# Patient Record
Sex: Female | Born: 1965
Health system: Southern US, Community
[De-identification: ages and names within clinical notes are randomized; demographics above are authoritative.]

## PROBLEM LIST (undated history)

## (undated) DIAGNOSIS — T7840XA Allergy, unspecified, initial encounter: Secondary | ICD-10-CM

## (undated) DIAGNOSIS — I509 Heart failure, unspecified: Secondary | ICD-10-CM

## (undated) DIAGNOSIS — R7303 Prediabetes: Secondary | ICD-10-CM

## (undated) DIAGNOSIS — R0602 Shortness of breath: Secondary | ICD-10-CM

## (undated) DIAGNOSIS — M1990 Unspecified osteoarthritis, unspecified site: Secondary | ICD-10-CM

## (undated) DIAGNOSIS — F329 Major depressive disorder, single episode, unspecified: Secondary | ICD-10-CM

## (undated) DIAGNOSIS — F1910 Other psychoactive substance abuse, uncomplicated: Secondary | ICD-10-CM

## (undated) DIAGNOSIS — J45909 Unspecified asthma, uncomplicated: Secondary | ICD-10-CM

## (undated) DIAGNOSIS — F419 Anxiety disorder, unspecified: Secondary | ICD-10-CM

## (undated) DIAGNOSIS — F32A Depression, unspecified: Secondary | ICD-10-CM

## (undated) DIAGNOSIS — I890 Lymphedema, not elsewhere classified: Secondary | ICD-10-CM

## (undated) DIAGNOSIS — I10 Essential (primary) hypertension: Secondary | ICD-10-CM

## (undated) DIAGNOSIS — R7989 Other specified abnormal findings of blood chemistry: Secondary | ICD-10-CM

## (undated) HISTORY — DX: Other psychoactive substance abuse, uncomplicated: F19.10

## (undated) HISTORY — DX: Allergy, unspecified, initial encounter: T78.40XA

## (undated) HISTORY — PX: BREAST BIOPSY: SHX20

## (undated) HISTORY — PX: TUBAL LIGATION: SHX77

## (undated) HISTORY — DX: Anxiety disorder, unspecified: F41.9

## (undated) HISTORY — DX: Unspecified osteoarthritis, unspecified site: M19.90

## (undated) HISTORY — PX: ABDOMINAL HYSTERECTOMY: SHX81

## (undated) HISTORY — DX: Other specified abnormal findings of blood chemistry: R79.89

## (undated) HISTORY — PX: HERNIA REPAIR: SHX51

## (undated) HISTORY — DX: Shortness of breath: R06.02

## (undated) HISTORY — DX: Heart failure, unspecified: I50.9

---
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
## 2017-06-01 DIAGNOSIS — R0789 Other chest pain: Secondary | ICD-10-CM | POA: Insufficient documentation

## 2019-12-24 DIAGNOSIS — J4541 Moderate persistent asthma with (acute) exacerbation: Secondary | ICD-10-CM | POA: Diagnosis not present

## 2019-12-24 DIAGNOSIS — Z23 Encounter for immunization: Secondary | ICD-10-CM | POA: Diagnosis not present

## 2019-12-24 DIAGNOSIS — Z1159 Encounter for screening for other viral diseases: Secondary | ICD-10-CM | POA: Diagnosis not present

## 2020-01-21 ENCOUNTER — Ambulatory Visit: Payer: Self-pay | Admitting: Allergy

## 2020-01-21 NOTE — Progress Notes (Deleted)
New Patient Note  RE: Janet [name] MRN: [id number] DOB: 03-22-1966 Date of Office Visit: 01/21/2020  Referring provider: No ref. provider found Primary care provider: No primary care provider on file.  Chief Complaint: No chief complaint on file.  History of Present Illness: I had the pleasure of seeing Janet [name] for initial evaluation at the [facility] [place] [facility] [place] on 01/21/2020. She is a 54 y.o. female, who is referred here by No primary care provider on file. for the evaluation of asthma.  She reports symptoms of *** chest tightness, shortness of breath, coughing, wheezing, nocturnal awakenings for *** years. Current medications include *** which help. She reports *** using aerochamber with inhalers. She tried the following inhalers: ***. Main triggers are ***allergies, infections, weather changes, smoke, exercise, pet exposure. In the last month, frequency of symptoms: ***x/week. Frequency of nocturnal symptoms: ***x/month. Frequency of SABA use: ***x/week. Interference with physical activity: ***. Sleep is ***disturbed. In the last 12 months, emergency room visits/urgent care visits/doctor office visits or hospitalizations due to respiratory issues: ***. In the last 12 months, oral steroids courses: ***. Lifetime history of hospitalization for respiratory issues: ***. Prior intubations: ***. Asthma was diagnosed at age *** by ***. History of pneumonia: ***. She was evaluated by allergist ***pulmonologist in the past. Smoking exposure: ***. Up to date with flu vaccine: ***. Up to date with pneumonia vaccine: ***.  History of reflux: ***.  Assessment and Plan: [name] is a 54 y.o. female with: No problem-specific Assessment & Plan notes found for this encounter.  No follow-ups on file.  No orders of the defined types were placed in this encounter.  Lab Orders  No laboratory test(s) ordered today    Other allergy screening: Asthma: {Blank  single:19197::"yes","no"} Rhino conjunctivitis: {Blank single:19197::"yes","no"} Food allergy: {Blank single:19197::"yes","no"} Medication allergy: {Blank single:19197::"yes","no"} Hymenoptera allergy: {Blank single:19197::"yes","no"} Urticaria: {Blank single:19197::"yes","no"} Eczema:{Blank single:19197::"yes","no"} History of recurrent infections suggestive of immunodeficency: {Blank single:19197::"yes","no"}  Diagnostics: Spirometry:  Tracings reviewed. Her effort: {Blank single:19197::"Good reproducible efforts.","It was hard to get consistent efforts and there is a question as to whether this reflects a maximal maneuver.","Poor effort, data can not be interpreted."} FVC: ***L FEV1: ***L, ***% predicted FEV1/FVC ratio: ***% Interpretation: {Blank single:19197::"Spirometry consistent with mild obstructive disease","Spirometry consistent with moderate obstructive disease","Spirometry consistent with severe obstructive disease","Spirometry consistent with possible restrictive disease","Spirometry consistent with mixed obstructive and restrictive disease","Spirometry uninterpretable due to technique","Spirometry consistent with normal pattern","No overt abnormalities noted given today's efforts"}.  Please see scanned spirometry results for details.  Skin Testing: {Blank single:19197::"Select foods","Environmental allergy panel","Environmental allergy panel and select foods","Food allergy panel","None","Deferred due to recent antihistamines use"}. Positive test to: ***. Negative test to: ***.  Results discussed with patient/family.   Past Medical History: There are no problems to display for this patient.  No past medical history on file. Past Surgical History: *** The histories are not reviewed yet. Please review them in the "History" navigator section and refresh this [place]. Medication List:  No current outpatient medications on file.   No current facility-administered  medications for this visit.   Allergies: Not on File Social History: Social History   Socioeconomic History  . Marital status: Not on file    Spouse name: Not on file  . Number of children: Not on file  . Years of education: Not on file  . Highest education level: Not on file  Occupational History  . Not on file  Tobacco Use  . Smoking status: Not on file  Substance and Sexual  Activity  . Alcohol use: Not on file  . Drug use: Not on file  . Sexual activity: Not on file  Other Topics Concern  . Not on file  Social History Narrative  . Not on file   Social Determinants of Health   Financial Resource Strain:   . Difficulty of Paying Living Expenses: Not on file  Food Insecurity:   . Worried About Charity fundraiser in the Last Year: Not on file  . Ran Out of Food in the Last Year: Not on file  Transportation Needs:   . Lack of Transportation (Medical): Not on file  . Lack of Transportation (Non-Medical): Not on file  Physical Activity:   . Days of Exercise per Week: Not on file  . Minutes of Exercise per Session: Not on file  Stress:   . Feeling of Stress : Not on file  Social Connections:   . Frequency of Communication with Friends and Family: Not on file  . Frequency of Social Gatherings with Friends and Family: Not on file  . Attends Religious Services: Not on file  . Active Member of Clubs or Organizations: Not on file  . Attends Archivist Meetings: Not on file  . Marital Status: Not on file   Lives in a ***. Smoking: *** Occupation: ***  Environmental HistoryFreight forwarder in the house: Estate agent in the family room: {Blank single:19197::"yes","no"} Carpet in the bedroom: {Blank single:19197::"yes","no"} Heating: {Blank single:19197::"electric","gas"} Cooling: {Blank single:19197::"central","window"} Pet: {Blank single:19197::"yes ***","no"}  Family History: No family history on file. Problem                                Relation Asthma                                   *** Eczema                                *** Food allergy                          *** Allergic rhino conjunctivitis     ***  Review of Systems  Constitutional: Negative for appetite change, chills, fever and unexpected weight change.  HENT: Negative for congestion and rhinorrhea.   Eyes: Negative for itching.  Respiratory: Negative for cough, chest tightness, shortness of breath and wheezing.   Cardiovascular: Negative for chest pain.  Gastrointestinal: Negative for abdominal pain.  Genitourinary: Negative for difficulty urinating.  Skin: Negative for rash.  Allergic/Immunologic: Negative for environmental allergies and food allergies.  Neurological: Negative for headaches.   Objective: There were no vitals taken for this visit. There is no height or weight on file to calculate BMI. Physical Exam  Constitutional: She is oriented to person, place, and time. She appears well-developed and well-nourished.  HENT:  Head: Normocephalic and atraumatic.  Right Ear: External ear normal.  Left Ear: External ear normal.  Nose: Nose normal.  Mouth/Throat: Oropharynx is clear and moist.  Eyes: Conjunctivae and EOM are normal.  Cardiovascular: Normal rate, regular rhythm and normal heart sounds. Exam reveals no gallop and no friction rub.  No murmur heard. Pulmonary/Chest: Effort normal and breath sounds normal. She has no wheezes. She has no rales.  Abdominal:  Soft.  Musculoskeletal:     Cervical back: Neck supple.  Neurological: She is alert and oriented to person, place, and time.  Skin: Skin is warm. No rash noted.  Psychiatric: She has a normal mood and affect. Her behavior is normal.  Nursing note and vitals reviewed.  The plan was reviewed with the patient/family, and all questions/concerned were addressed.  It was my pleasure to see Bonny today and participate in her care. Please feel free to contact me with  any questions or concerns.  Sincerely,  Rexene Alberts, DO Allergy & Immunology  [facility] office: [phone] [facility] office: [place] office: [phone]

## 2020-01-24 ENCOUNTER — Other Ambulatory Visit: Payer: Self-pay

## 2020-01-24 ENCOUNTER — Ambulatory Visit (INDEPENDENT_AMBULATORY_CARE_PROVIDER_SITE_OTHER): Payer: BC Managed Care – PPO

## 2020-01-24 ENCOUNTER — Encounter (HOSPITAL_COMMUNITY): Payer: Self-pay | Admitting: *Deleted

## 2020-01-24 ENCOUNTER — Ambulatory Visit (HOSPITAL_COMMUNITY)
Admission: EM | Admit: 2020-01-24 | Discharge: 2020-01-24 | Disposition: A | Discharge status: Home / Self Care | Payer: BC Managed Care – PPO

## 2020-01-24 DIAGNOSIS — J4541 Moderate persistent asthma with (acute) exacerbation: Secondary | ICD-10-CM | POA: Diagnosis not present

## 2020-01-24 DIAGNOSIS — I10 Essential (primary) hypertension: Secondary | ICD-10-CM

## 2020-01-24 DIAGNOSIS — R05 Cough: Secondary | ICD-10-CM | POA: Diagnosis not present

## 2020-01-24 DIAGNOSIS — R0602 Shortness of breath: Secondary | ICD-10-CM | POA: Diagnosis not present

## 2020-01-24 HISTORY — DX: Depression, unspecified: F32.A

## 2020-01-24 HISTORY — DX: Lymphedema, not elsewhere classified: I89.0

## 2020-01-24 HISTORY — DX: Unspecified asthma, uncomplicated: J45.909

## 2020-01-24 HISTORY — DX: Essential (primary) hypertension: I10

## 2020-01-24 MED ORDER — PREDNISONE 20 MG PO TABS: ORAL_TABLET | ORAL | 0 refills | Status: DC

## 2020-01-24 MED ORDER — PROMETHAZINE-DM 6.25-15 MG/5ML PO SYRP: 5.0000 mL | ORAL_SOLUTION | Freq: Four times a day (QID) | ORAL | 0 refills | Status: DC | PRN

## 2020-01-24 MED ORDER — ALBUTEROL SULFATE HFA 108 (90 BASE) MCG/ACT IN AERS: 2.0000 | INHALATION_SPRAY | RESPIRATORY_TRACT | Status: DC | PRN

## 2020-01-24 MED ORDER — LOSARTAN POTASSIUM-HCTZ 100-12.5 MG PO TABS: 1.0000 | ORAL_TABLET | Freq: Every day | ORAL | 1 refills | Status: DC

## 2020-01-24 MED ORDER — DOXYCYCLINE HYCLATE 100 MG PO CAPS: 100.0000 mg | ORAL_CAPSULE | Freq: Two times a day (BID) | ORAL | 0 refills | Status: DC

## 2020-01-24 NOTE — ED Triage Notes (Signed)
Pt reports recently moving to [facility] - does not have PCP yet.  C/O asthma flare-up - cough (states her normal asthma cough), SOB, wheezing.  Has been using albuterol HFA without significant relief.  STates has been unable to use nebulizer due to missing mouth piece.  Denies any fevers, or any other sxs.  Pt declines Covid test.

## 2020-01-24 NOTE — ED Triage Notes (Signed)
Pt states she will run out of some of her meds shortly.

## 2020-01-24 NOTE — Discharge Instructions (Addendum)
I am placing you on a prednisone taper, Take Prednisone 20 mg,  in mornings with breakfast as follows:  Take 3 pills for 3 days, Take 2 pills for 3 days, and Take 1 pill for 3 days. Complete all medication.  Your chest x-ray is normal.  I have printed off a prescription for doxycycline which is an antibiotic I would like for you to start in 3 days if your symptoms have not significantly improved or if cough becomes productive as this could be an negative of bronchitis.  I have refilled her blood pressure medication.  I have also sent over Promethazine DM 5 mL every 6 hours as needed for cough.

## 2020-01-24 NOTE — ED Notes (Signed)
Nebulizer mouth piece set-up provided to pt for home use.

## 2020-01-24 NOTE — ED Provider Notes (Addendum)
[facility]    CSN: [id number] Arrival date & time: 01/24/20  1105      History   Chief Complaint Chief Complaint  Patient presents with  . Asthma    HPI Janet [name] is a 54 y.o. female.   HPI  Patient presents today concern for an asthma exacerbation.  Patient suffers from chronic asthma and is currently prescribed Symbicort, albuterol neb and inhaler.  She reports not using her nebulizer recently due to tubing issues.  She feels her albuterol is not helping.  A provider in the past had her on daily prednisone however she is recently relocated and has not been prescribed chronic steroids.  Last prescription for prednisone was the first week of February.  She reported initial improvement of shortness of breath and wheezing however she is struggling with shortness of breath, wheezing, and coughing persistently worsening over the last few weeks.  She has not had any known contact with anyone who is COVID-19 positive.  She declines testing today.  She is requesting a refill of her blood pressure medication.  Blood pressure is elevated on today.  She does not have a PCP due to recently located to [place].  Past Medical History:  Diagnosis Date  . Asthma   . Depression   . Hypertension   . Lymphedema     There are no problems to display for this patient.   Past Surgical History:  Procedure Laterality Date  . ABDOMINAL HYSTERECTOMY    . CESAREAN SECTION    . HERNIA REPAIR    . TUBAL LIGATION      OB History   No obstetric history on file.      Home Medications    Prior to Admission medications   Medication Sig Start Date End Date Taking? Authorizing Provider  albuterol (PROVENTIL) (5 MG/ML) 0.5% nebulizer solution Take 2.5 mg by nebulization every 6 (six) hours as needed for wheezing or shortness of breath.   Yes [provider]  ALBUTEROL SULFATE HFA IN Inhale into the lungs.   Yes [provider]  ARIPiprazole (ABILIFY) 10 MG tablet  Take 10 mg by mouth daily.   Yes [provider]  budesonide-formoterol (SYMBICORT) 160-4.5 MCG/ACT inhaler Inhale 2 puffs into the lungs 2 (two) times daily.   Yes [provider]  losartan-hydrochlorothiazide (HYZAAR) 100-12.5 MG tablet Take 1 tablet by mouth daily.   Yes [provider]  montelukast (SINGULAIR) 10 MG tablet Take 10 mg by mouth at bedtime.   Yes [provider]  sertraline (ZOLOFT) 100 MG tablet Take 100 mg by mouth daily.   Yes [provider]  traZODone (DESYREL) 100 MG tablet Take 100 mg by mouth at bedtime.   Yes [provider]    Family History Family History  Problem Relation Age of Onset  . Hypertension Mother   . Stroke Mother   . Deep vein thrombosis Mother     Social History Social History   Tobacco Use  . Smoking status: Never Smoker  . Smokeless tobacco: Never Used  Substance Use Topics  . Alcohol use: Yes    Comment: occasional  . Drug use: Yes    Types: Marijuana    Comment: once/month     Allergies   Biaxin [clarithromycin] and Penicillins   Review of Systems Review of Systems Pertinent negatives listed in HPI  Physical Exam Triage Vital Signs ED Triage Vitals  Enc Vitals Group     BP 01/24/20 1234 (!) 149/93  Pulse Rate 01/24/20 1234 74     Resp 01/24/20 1234 (!) 24     Temp 01/24/20 1234 98.6 F (37 C)     Temp Source 01/24/20 1234 Oral     SpO2 01/24/20 1234 100 %     Weight --      Height --      Head Circumference --      Peak Flow --      Pain Score 01/24/20 1235 0     Pain Loc --      Pain Edu? --      Excl. in [place]? --    No data found.  Updated Vital Signs BP (!) 149/93 Comment: has not taken HTN med today  Pulse 74   Temp 98.6 F (37 C) (Oral)   Resp (!) 24   SpO2 100%   Visual Acuity Right Eye Distance:   Left Eye Distance:   Bilateral Distance:    Right Eye Near:   Left Eye Near:    Bilateral Near:     Physical Exam Constitutional:       Appearance: Normal appearance.  HENT:     Head: Normocephalic.  Cardiovascular:     Rate and Rhythm: Normal rate and regular rhythm.  Pulmonary:     Breath sounds: Wheezing and rhonchi present.  Chest:     Chest wall: No tenderness.  Abdominal:     General: Bowel sounds are normal.     Palpations: Abdomen is soft.  Musculoskeletal:        General: Normal range of motion.     Cervical back: Normal range of motion.  Neurological:     General: No focal deficit present.     Mental Status: She is alert.  Psychiatric:        Mood and Affect: Mood normal.      UC Treatments / Results  Labs (all labs ordered are listed, but only abnormal results are displayed) Labs Reviewed - No data to display  EKG   Radiology DG Chest 2 View  Result Date: 01/24/2020 CLINICAL DATA:  Shortness of breath and cough. EXAM: CHEST - 2 VIEW COMPARISON:  None. FINDINGS: Both lungs are clear. Negative for a pneumothorax. Heart and mediastinum are within normal limits. Trachea is midline. Degenerative changes in the thoracic spine. No pleural effusions. IMPRESSION: No active cardiopulmonary disease. Electronically Signed   By: Markus Daft M.D.   On: 01/24/2020 13:26    Procedures Procedures (including critical care time)  Medications Ordered in UC Medications - No data to display  Initial Impression / Assessment and Plan / UC Course  I have reviewed the triage vital signs and the nursing notes.  Pertinent labs & imaging results that were available during my care of the patient were reviewed by me and considered in my medical decision making (see chart for details).    Treating for acute asthma exacerbation. Trial a prednisone taper. Patient given a prescription for Doxycyline to start in 3 days if symptoms do not improve with prednisone initiation. Promethazine-DM prescribed for anti-tussive therapy. Chest x-ray reassuring. Encouraged consistent use of inhaled therapies and new nebulizer tubing  provided. Refilled antihypertensive medication. Encouraged to establish care at [facility] at [facility] to receive on-going management of chronic conditions. Final Clinical Impressions(s) / UC Diagnoses   Final diagnoses:  Moderate persistent asthma with exacerbation  Essential hypertension     Discharge Instructions     I am placing you on a prednisone taper, Take  Prednisone 20 mg,  in mornings with breakfast as follows:  Take 3 pills for 3 days, Take 2 pills for 3 days, and Take 1 pill for 3 days. Complete all medication.  Your chest x-ray is normal.  I have printed off a prescription for doxycycline which is an antibiotic I would like for you to start in 3 days if your symptoms have not significantly improved or if cough becomes productive as this could be an negative of bronchitis.  I have refilled her blood pressure medication.  I have also sent over Promethazine DM 5 mL every 6 hours as needed for cough.      ED Prescriptions    Medication Sig Dispense Auth. Provider   losartan-hydrochlorothiazide (HYZAAR) 100-12.5 MG tablet Take 1 tablet by mouth daily. 30 tablet Scot Jun, FNP   predniSONE (DELTASONE) 20 MG tablet Take 3 PO QAM x3days, 2 PO QAM x3days, 1 PO QAM x3days 18 tablet Scot Jun, FNP   doxycycline (VIBRAMYCIN) 100 MG capsule Take 1 capsule (100 mg total) by mouth 2 (two) times daily. 20 capsule Scot Jun, FNP   promethazine-dextromethorphan (PROMETHAZINE-DM) 6.25-15 MG/5ML syrup Take 5 mLs by mouth 4 (four) times daily as needed for cough. 140 mL Scot Jun, FNP     PDMP not reviewed this encounter.   Scot Jun, FNP 01/25/20 2105    Scot Jun, FNP 01/25/20 2107

## 2020-03-24 ENCOUNTER — Other Ambulatory Visit: Payer: Self-pay | Admitting: Family Medicine

## 2020-03-29 DIAGNOSIS — I890 Lymphedema, not elsewhere classified: Secondary | ICD-10-CM | POA: Diagnosis not present

## 2020-03-29 DIAGNOSIS — R0602 Shortness of breath: Secondary | ICD-10-CM | POA: Diagnosis not present

## 2020-04-05 ENCOUNTER — Other Ambulatory Visit: Payer: Self-pay

## 2020-04-05 NOTE — Telephone Encounter (Signed)
NOTES ON FILE FROM [employer] [phone], SENT REFERRAL TO SCHEDULING

## 2020-04-06 ENCOUNTER — Telehealth: Payer: Self-pay

## 2020-04-06 NOTE — Telephone Encounter (Signed)
NOTES ON FILE FROM [employer] [phone]. REFERRAL SENT TO SCHEDULING

## 2020-04-22 ENCOUNTER — Ambulatory Visit: Payer: BC Managed Care – PPO | Admitting: Cardiology

## 2020-06-01 DIAGNOSIS — F411 Generalized anxiety disorder: Secondary | ICD-10-CM | POA: Diagnosis not present

## 2020-06-01 DIAGNOSIS — F4310 Post-traumatic stress disorder, unspecified: Secondary | ICD-10-CM | POA: Diagnosis not present

## 2020-06-01 DIAGNOSIS — F3132 Bipolar disorder, current episode depressed, moderate: Secondary | ICD-10-CM | POA: Diagnosis not present

## 2020-06-11 ENCOUNTER — Ambulatory Visit: Payer: BC Managed Care – PPO | Admitting: Interventional Cardiology

## 2020-06-14 DIAGNOSIS — F3132 Bipolar disorder, current episode depressed, moderate: Secondary | ICD-10-CM | POA: Diagnosis not present

## 2020-06-18 DIAGNOSIS — I10 Essential (primary) hypertension: Secondary | ICD-10-CM | POA: Diagnosis not present

## 2020-06-18 DIAGNOSIS — Z13220 Encounter for screening for lipoid disorders: Secondary | ICD-10-CM | POA: Diagnosis not present

## 2020-06-18 DIAGNOSIS — R0601 Orthopnea: Secondary | ICD-10-CM | POA: Diagnosis not present

## 2020-06-18 DIAGNOSIS — J4541 Moderate persistent asthma with (acute) exacerbation: Secondary | ICD-10-CM | POA: Diagnosis not present

## 2020-07-15 DIAGNOSIS — F411 Generalized anxiety disorder: Secondary | ICD-10-CM | POA: Diagnosis not present

## 2020-07-15 DIAGNOSIS — F3132 Bipolar disorder, current episode depressed, moderate: Secondary | ICD-10-CM | POA: Diagnosis not present

## 2020-07-15 DIAGNOSIS — F4310 Post-traumatic stress disorder, unspecified: Secondary | ICD-10-CM | POA: Diagnosis not present

## 2020-07-28 ENCOUNTER — Ambulatory Visit (INDEPENDENT_AMBULATORY_CARE_PROVIDER_SITE_OTHER): Payer: BC Managed Care – PPO | Admitting: Cardiovascular Disease

## 2020-07-28 ENCOUNTER — Other Ambulatory Visit: Payer: Self-pay

## 2020-07-28 ENCOUNTER — Encounter: Payer: Self-pay | Admitting: Cardiovascular Disease

## 2020-07-28 VITALS — BP 114/82 | HR 78 | Ht 68.0 in | Wt 291.4 lb

## 2020-07-28 DIAGNOSIS — R0600 Dyspnea, unspecified: Secondary | ICD-10-CM | POA: Diagnosis not present

## 2020-07-28 DIAGNOSIS — R0609 Other forms of dyspnea: Secondary | ICD-10-CM

## 2020-07-28 NOTE — Patient Instructions (Addendum)
Medication Instructions:  Your physician recommends that you continue on your current medications as directed. Please refer to the Current Medication list given to you today.  *If you need a refill on your cardiac medications before your next appointment, please call your pharmacy*   Lab Work: None  If you have labs (blood work) drawn today and your tests are completely normal, you will receive your results only by: Marland Kitchen MyChart Message (if you have MyChart) OR . A paper copy in the mail If you have any lab test that is abnormal or we need to change your treatment, we will call you to review the results.   Testing/Procedures: Your physician has requested that you have an echocardiogram. Echocardiography is a painless test that uses sound waves to create images of your heart. It provides your doctor with information about the size and shape of your heart and how well your heart's chambers and valves are working. This procedure takes approximately one hour. There are no restrictions for this procedure.  Follow-Up: AS NEEDED  Other Instructions You have been referred to Medical Weight Management Program   Mediterranean Diet A Mediterranean diet refers to food and lifestyle choices that are based on the traditions of countries located on the [employer]. This way of eating has been shown to help prevent certain conditions and improve outcomes for people who have chronic diseases, like kidney disease and heart disease. What are tips for following this plan? Lifestyle  Cook and eat meals together with your family, when possible.  Drink enough fluid to keep your urine clear or pale yellow.  Be physically active every day. This includes: ? Aerobic exercise like running or swimming. ? Leisure activities like gardening, walking, or housework.  Get 7-8 hours of sleep each night.  If recommended by your health care provider, drink red wine in moderation. This means 1 glass a day for  nonpregnant women and 2 glasses a day for men. A glass of wine equals 5 oz (150 mL). Reading food labels   Check the serving size of packaged foods. For foods such as rice and pasta, the serving size refers to the amount of cooked product, not dry.  Check the total fat in packaged foods. Avoid foods that have saturated fat or trans fats.  Check the ingredients list for added sugars, such as corn syrup. Shopping  At the grocery store, buy most of your food from the areas near the walls of the store. This includes: ? Fresh fruits and vegetables (produce). ? Grains, beans, nuts, and seeds. Some of these may be available in unpackaged forms or large amounts (in bulk). ? Fresh seafood. ? Poultry and eggs. ? Low-fat dairy products.  Buy whole ingredients instead of prepackaged foods.  Buy fresh fruits and vegetables in-season from local farmers markets.  Buy frozen fruits and vegetables in resealable bags.  If you do not have access to quality fresh seafood, buy precooked frozen shrimp or canned fish, such as tuna, salmon, or sardines.  Buy small amounts of raw or cooked vegetables, salads, or olives from the deli or salad bar at your store.  Stock your pantry so you always have certain foods on hand, such as olive oil, canned tuna, canned tomatoes, rice, pasta, and beans. Cooking  Cook foods with extra-virgin olive oil instead of using butter or other vegetable oils.  Have meat as a side dish, and have vegetables or grains as your main dish. This means having meat in small portions or  adding small amounts of meat to foods like pasta or stew.  Use beans or vegetables instead of meat in common dishes like chili or lasagna.  Experiment with different cooking methods. Try roasting or broiling vegetables instead of steaming or sauteing them.  Add frozen vegetables to soups, stews, pasta, or rice.  Add nuts or seeds for added healthy fat at each meal. You can add these to yogurt,  salads, or vegetable dishes.  Marinate fish or vegetables using olive oil, lemon juice, garlic, and fresh herbs. Meal planning   Plan to eat 1 vegetarian meal one day each week. Try to work up to 2 vegetarian meals, if possible.  Eat seafood 2 or more times a week.  Have healthy snacks readily available, such as: ? Vegetable sticks with hummus. ? [place] yogurt. ? Fruit and nut trail mix.  Eat balanced meals throughout the week. This includes: ? Fruit: 2-3 servings a day ? Vegetables: 4-5 servings a day ? Low-fat dairy: 2 servings a day ? Fish, poultry, or lean meat: 1 serving a day ? Beans and legumes: 2 or more servings a week ? Nuts and seeds: 1-2 servings a day ? Whole grains: 6-8 servings a day ? Extra-virgin olive oil: 3-4 servings a day  Limit red meat and sweets to only a few servings a month What are my food choices?  Mediterranean diet ? Recommended  Grains: Whole-grain pasta. Brown rice. Bulgar wheat. Polenta. Couscous. Whole-wheat bread. [place].  Vegetables: Artichokes. Beets. Broccoli. Cabbage. Carrots. Eggplant. Green beans. Chard. Kale. Spinach. Onions. Leeks. Peas. Squash. Tomatoes. Peppers. Radishes.  Fruits: Apples. Apricots. Avocado. Berries. Bananas. Cherries. Dates. Figs. Grapes. Lemons. Melon. Oranges. Peaches. Plums. Pomegranate.  Meats and other protein foods: Beans. Almonds. Sunflower seeds. Pine nuts. Peanuts. [place]. Salmon. Scallops. Shrimp. [place]. Tilapia. Clams. Oysters. Eggs.  Dairy: Low-fat milk. Cheese. Greek yogurt.  Beverages: Water. Red wine. Herbal tea.  Fats and oils: Extra virgin olive oil. Avocado oil. Grape seed oil.  Sweets and desserts: [place] yogurt with honey. Baked apples. Poached pears. Trail mix.  Seasoning and other foods: Basil. Cilantro. Coriander. Cumin. Mint. Parsley. Sage. Rosemary. Tarragon. Garlic. Oregano. Thyme. Pepper. Balsalmic vinegar. Tahini. Hummus. Tomato sauce. Olives. Mushrooms. ? Limit  these  Grains: Prepackaged pasta or rice dishes. Prepackaged cereal with added sugar.  Vegetables: Deep fried potatoes (french fries).  Fruits: Fruit canned in syrup.  Meats and other protein foods: Beef. Pork. Lamb. Poultry with skin. Hot dogs. Berniece Salines.  Dairy: Ice cream. Sour cream. Whole milk.  Beverages: Juice. Sugar-sweetened soft drinks. Beer. Liquor and spirits.  Fats and oils: Butter. Canola oil. Vegetable oil. Beef fat (tallow). Lard.  Sweets and desserts: Cookies. Cakes. Pies. Candy.  Seasoning and other foods: Mayonnaise. Premade sauces and marinades. The items listed may not be a complete list. Talk with your dietitian about what dietary choices are right for you. Summary  The Mediterranean diet includes both food and lifestyle choices.  Eat a variety of fresh fruits and vegetables, beans, nuts, seeds, and whole grains.  Limit the amount of red meat and sweets that you eat.  Talk with your health care provider about whether it is safe for you to drink red wine in moderation. This means 1 glass a day for nonpregnant women and 2 glasses a day for men. A glass of wine equals 5 oz (150 mL). This information is not intended to replace advice given to you by your health care provider. Make sure you discuss any questions you have  with your health care provider. Document Revised: 07/06/2016 Document Reviewed: 06/29/2016 Elsevier Patient Education  Green.

## 2020-07-28 NOTE — Progress Notes (Signed)
Cardiology Office Note:    Date:  07/28/2020   ID:  Janet [name], DOB February 12, 1966, MRN [id number]  PCP:  Patient, No Pcp Per  [facility] Cardiologist:  Antonya Leeder  [facility] Electrophysiologist:  None   Referring MD: Nadyne Coombes, NP   Chief Complaint  Patient presents with  . Shortness of Breath     Sept. 8, 2021   Janet [name] is a 54 y.o. female with a hx of *obesity, asthma, HTN who we are asked to see by Carla Drape , NP for further evaluation of her shortness of breath   Is short of breath, primarily at night For the past 6 months  Has gained 30 lbs during that time  Long standing lymphedema  Walks on occasion Is going to school  ( [employer]) social work major Walk on campus  Has DOE walking around campus  Has had both covid vaccines.   No CP .  Has just recently improved her diet  Decided to improve her diet when she was referred to a cardiologist     Past Medical History:  Diagnosis Date  . Asthma   . Depression   . Elevated brain natriuretic peptide (BNP) level   . Hypertension   . Lymphedema   . SOB (shortness of breath)     Past Surgical History:  Procedure Laterality Date  . ABDOMINAL HYSTERECTOMY    . CESAREAN SECTION    . HERNIA REPAIR    . TUBAL LIGATION      Current Medications: Current Meds  Medication Sig  . albuterol (PROVENTIL) (5 MG/ML) 0.5% nebulizer solution Take 2.5 mg by nebulization every 6 (six) hours as needed for wheezing or shortness of breath.  . ARIPiprazole (ABILIFY) 10 MG tablet Take 10 mg by mouth daily.  . budesonide-formoterol (SYMBICORT) 160-4.5 MCG/ACT inhaler Inhale 2 puffs into the lungs 2 (two) times daily.  Marland Kitchen losartan-hydrochlorothiazide (HYZAAR) 100-12.5 MG tablet Take 1 tablet by mouth daily.  . montelukast (SINGULAIR) 10 MG tablet Take 10 mg by mouth at bedtime.  . promethazine-dextromethorphan (PROMETHAZINE-DM) 6.25-15 MG/5ML syrup Take 5 mLs by mouth 4 (four) times daily as needed for cough.  .  traZODone (DESYREL) 100 MG tablet Take 100 mg by mouth at bedtime.     Allergies:   Biaxin [clarithromycin] and Penicillins   Social History   Socioeconomic History  . Marital status: Single    Spouse name: Not on file  . Number of children: Not on file  . Years of education: Not on file  . Highest education level: Not on file  Occupational History  . Not on file  Tobacco Use  . Smoking status: Never Smoker  . Smokeless tobacco: Never Used  Vaping Use  . Vaping Use: Never used  Substance and Sexual Activity  . Alcohol use: Yes    Comment: occasional  . Drug use: Yes    Types: Marijuana    Comment: once/month  . Sexual activity: Yes    Birth control/protection: Surgical  Other Topics Concern  . Not on file  Social History Narrative  . Not on file   Social Determinants of Health   Financial Resource Strain:   . Difficulty of Paying Living Expenses: Not on file  Food Insecurity:   . Worried About Charity fundraiser in the Last Year: Not on file  . Ran Out of Food in the Last Year: Not on file  Transportation Needs:   . Lack of Transportation (Medical): Not on file  .  Lack of Transportation (Non-Medical): Not on file  Physical Activity:   . Days of Exercise per Week: Not on file  . Minutes of Exercise per Session: Not on file  Stress:   . Feeling of Stress : Not on file  Social Connections:   . Frequency of Communication with Friends and Family: Not on file  . Frequency of Social Gatherings with Friends and Family: Not on file  . Attends Religious Services: Not on file  . Active Member of Clubs or Organizations: Not on file  . Attends Archivist Meetings: Not on file  . Marital Status: Not on file     Family History: The patient's family history includes Congestive Heart Failure in her maternal grandmother and mother; Deep vein thrombosis in her mother; Hypertension in her mother; Obesity in her brother and sister; Stroke in her mother.  ROS:     Please see the history of present illness.     All other systems reviewed and are negative.  EKGs/Labs/Other Studies Reviewed:    The following studies were reviewed today:   EKG: July 28, 2020: Normal sinus rhythm at 78.  Nonspecific ST and T wave abnormalities.  Recent Labs: No results found for requested labs within last 8760 hours.  Recent Lipid Panel No results found for: CHOL, TRIG, HDL, CHOLHDL, VLDL, LDLCALC, LDLDIRECT  Physical Exam:    VS:  BP 114/82   Pulse 78   Ht 5\' 8"  (1.727 m)   Wt 291 lb 6.4 oz (132.2 kg)   SpO2 97%   BMI 44.31 kg/m     Wt Readings from Last 3 Encounters:  07/28/20 291 lb 6.4 oz (132.2 kg)     GEN: morbidly obese female,  Very pleasant  HEENT: Normal NECK: No JVD; No carotid bruits LYMPHATICS: No lymphadenopathy CARDIAC: RRR, no murmurs, rubs, gallops RESPIRATORY:  Clear to auscultation without rales, wheezing or rhonchi  ABDOMEN: Soft, non-tender, non-distended MUSCULOSKELETAL:  No edema; No deformity  SKIN: Warm and dry NEUROLOGIC:  Alert and oriented x 3 PSYCHIATRIC:  Normal affect   ASSESSMENT:    1. DOE (dyspnea on exertion)    PLAN:    In order of problems listed above:  1. DOE: Aviella has had shortness of breath for the past 6 months or so.  She admits that she has been eating poorly and has gained lots of weight over the past several months.  She is back in school at [employer] and is getting her social work degree.  She is having trouble with PND and orthopnea.  She also so has significant DOE.  I strongly suspect that this is weight gain issue for her.  I would like to get an echocardiogram to evaluate her her cardiac status.  I strongly advised her to work on weight loss program.  We will refer her to the [facility] weight loss program.  Also given her some recommendations on the Mediterranean diet.  She says that she is committed to losing weight and has an excellent attitude.  I will see her on an as-needed basis or  if the echocardiogram shows some significant abnormality.  I told her to call us anytime for an office visit if needed.   Medication Adjustments/Labs and Tests Ordered: Current medicines are reviewed at length with the patient today.  Concerns regarding medicines are outlined above.  Orders Placed This Encounter  Procedures  . Amb Ref to Medical Weight Management  . EKG 12-Lead  . ECHOCARDIOGRAM COMPLETE  No orders of the defined types were placed in this encounter.    Patient Instructions  Medication Instructions:  Your physician recommends that you continue on your current medications as directed. Please refer to the Current Medication list given to you today.  *If you need a refill on your cardiac medications before your next appointment, please call your pharmacy*   Lab Work: None  If you have labs (blood work) drawn today and your tests are completely normal, you will receive your results only by: Marland Kitchen MyChart Message (if you have MyChart) OR . A paper copy in the mail If you have any lab test that is abnormal or we need to change your treatment, we will call you to review the results.   Testing/Procedures: Your physician has requested that you have an echocardiogram. Echocardiography is a painless test that uses sound waves to create images of your heart. It provides your doctor with information about the size and shape of your heart and how well your heart's chambers and valves are working. This procedure takes approximately one hour. There are no restrictions for this procedure.  Follow-Up: AS NEEDED  Other Instructions You have been referred to Medical Weight Management Program   Mediterranean Diet A Mediterranean diet refers to food and lifestyle choices that are based on the traditions of countries located on the [employer]. This way of eating has been shown to help prevent certain conditions and improve outcomes for people who have chronic diseases, like  kidney disease and heart disease. What are tips for following this plan? Lifestyle  Cook and eat meals together with your family, when possible.  Drink enough fluid to keep your urine clear or pale yellow.  Be physically active every day. This includes: ? Aerobic exercise like running or swimming. ? Leisure activities like gardening, walking, or housework.  Get 7-8 hours of sleep each night.  If recommended by your health care provider, drink red wine in moderation. This means 1 glass a day for nonpregnant women and 2 glasses a day for men. A glass of wine equals 5 oz (150 mL). Reading food labels   Check the serving size of packaged foods. For foods such as rice and pasta, the serving size refers to the amount of cooked product, not dry.  Check the total fat in packaged foods. Avoid foods that have saturated fat or trans fats.  Check the ingredients list for added sugars, such as corn syrup. Shopping  At the grocery store, buy most of your food from the areas near the walls of the store. This includes: ? Fresh fruits and vegetables (produce). ? Grains, beans, nuts, and seeds. Some of these may be available in unpackaged forms or large amounts (in bulk). ? Fresh seafood. ? Poultry and eggs. ? Low-fat dairy products.  Buy whole ingredients instead of prepackaged foods.  Buy fresh fruits and vegetables in-season from local farmers markets.  Buy frozen fruits and vegetables in resealable bags.  If you do not have access to quality fresh seafood, buy precooked frozen shrimp or canned fish, such as tuna, salmon, or sardines.  Buy small amounts of raw or cooked vegetables, salads, or olives from the deli or salad bar at your store.  Stock your pantry so you always have certain foods on hand, such as olive oil, canned tuna, canned tomatoes, rice, pasta, and beans. Cooking  Cook foods with extra-virgin olive oil instead of using butter or other vegetable oils.  Have meat as a  side  dish, and have vegetables or grains as your main dish. This means having meat in small portions or adding small amounts of meat to foods like pasta or stew.  Use beans or vegetables instead of meat in common dishes like chili or lasagna.  Experiment with different cooking methods. Try roasting or broiling vegetables instead of steaming or sauteing them.  Add frozen vegetables to soups, stews, pasta, or rice.  Add nuts or seeds for added healthy fat at each meal. You can add these to yogurt, salads, or vegetable dishes.  Marinate fish or vegetables using olive oil, lemon juice, garlic, and fresh herbs. Meal planning   Plan to eat 1 vegetarian meal one day each week. Try to work up to 2 vegetarian meals, if possible.  Eat seafood 2 or more times a week.  Have healthy snacks readily available, such as: ? Vegetable sticks with hummus. ? [place] yogurt. ? Fruit and nut trail mix.  Eat balanced meals throughout the week. This includes: ? Fruit: 2-3 servings a day ? Vegetables: 4-5 servings a day ? Low-fat dairy: 2 servings a day ? Fish, poultry, or lean meat: 1 serving a day ? Beans and legumes: 2 or more servings a week ? Nuts and seeds: 1-2 servings a day ? Whole grains: 6-8 servings a day ? Extra-virgin olive oil: 3-4 servings a day  Limit red meat and sweets to only a few servings a month What are my food choices?  Mediterranean diet ? Recommended  Grains: Whole-grain pasta. Brown rice. Bulgar wheat. Polenta. Couscous. Whole-wheat bread. [place].  Vegetables: Artichokes. Beets. Broccoli. Cabbage. Carrots. Eggplant. Green beans. Chard. Kale. Spinach. Onions. Leeks. Peas. Squash. Tomatoes. Peppers. Radishes.  Fruits: Apples. Apricots. Avocado. Berries. Bananas. Cherries. Dates. Figs. Grapes. Lemons. Melon. Oranges. Peaches. Plums. Pomegranate.  Meats and other protein foods: Beans. Almonds. Sunflower seeds. Pine nuts. Peanuts. [place]. Salmon. Scallops. Shrimp. [place].  Tilapia. Clams. Oysters. Eggs.  Dairy: Low-fat milk. Cheese. Greek yogurt.  Beverages: Water. Red wine. Herbal tea.  Fats and oils: Extra virgin olive oil. Avocado oil. Grape seed oil.  Sweets and desserts: [place] yogurt with honey. Baked apples. Poached pears. Trail mix.  Seasoning and other foods: Basil. Cilantro. Coriander. Cumin. Mint. Parsley. Sage. Rosemary. Tarragon. Garlic. Oregano. Thyme. Pepper. Balsalmic vinegar. Tahini. Hummus. Tomato sauce. Olives. Mushrooms. ? Limit these  Grains: Prepackaged pasta or rice dishes. Prepackaged cereal with added sugar.  Vegetables: Deep fried potatoes (french fries).  Fruits: Fruit canned in syrup.  Meats and other protein foods: Beef. Pork. Lamb. Poultry with skin. Hot dogs. Berniece Salines.  Dairy: Ice cream. Sour cream. Whole milk.  Beverages: Juice. Sugar-sweetened soft drinks. Beer. Liquor and spirits.  Fats and oils: Butter. Canola oil. Vegetable oil. Beef fat (tallow). Lard.  Sweets and desserts: Cookies. Cakes. Pies. Candy.  Seasoning and other foods: Mayonnaise. Premade sauces and marinades. The items listed may not be a complete list. Talk with your dietitian about what dietary choices are right for you. Summary  The Mediterranean diet includes both food and lifestyle choices.  Eat a variety of fresh fruits and vegetables, beans, nuts, seeds, and whole grains.  Limit the amount of red meat and sweets that you eat.  Talk with your health care provider about whether it is safe for you to drink red wine in moderation. This means 1 glass a day for nonpregnant women and 2 glasses a day for men. A glass of wine equals 5 oz (150 mL). This information is not intended to replace  advice given to you by your health care provider. Make sure you discuss any questions you have with your health care provider. Document Revised: 07/06/2016 Document Reviewed: 06/29/2016 Elsevier Patient Education  2020 Hickman, Mertie Moores,  MD  07/28/2020 6:15 PM    [facility]

## 2020-08-04 DIAGNOSIS — J4531 Mild persistent asthma with (acute) exacerbation: Secondary | ICD-10-CM | POA: Diagnosis not present

## 2020-08-12 ENCOUNTER — Ambulatory Visit (HOSPITAL_COMMUNITY): Payer: BC Managed Care – PPO | Attending: Internal Medicine

## 2020-08-12 ENCOUNTER — Other Ambulatory Visit: Payer: Self-pay

## 2020-08-12 DIAGNOSIS — R0600 Dyspnea, unspecified: Secondary | ICD-10-CM | POA: Diagnosis not present

## 2020-08-12 DIAGNOSIS — R0609 Other forms of dyspnea: Secondary | ICD-10-CM

## 2020-08-12 LAB — ECHOCARDIOGRAM COMPLETE
Area-P 1/2: 3.77 cm2
S' Lateral: 4.4 cm

## 2020-08-13 ENCOUNTER — Telehealth: Payer: Self-pay | Admitting: Cardiovascular Disease

## 2020-08-13 NOTE — Telephone Encounter (Signed)
I spoke with patient and reviewed echo results with her.  Appt scheduled with Richardson Dopp, PA for January 4,2022 at 10:15

## 2020-08-13 NOTE — Telephone Encounter (Signed)
Patient called and stated that she is returning phone call to get her echo results.

## 2020-10-06 DIAGNOSIS — M25562 Pain in left knee: Secondary | ICD-10-CM | POA: Diagnosis not present

## 2020-10-06 DIAGNOSIS — R062 Wheezing: Secondary | ICD-10-CM | POA: Diagnosis not present

## 2020-10-22 DIAGNOSIS — J069 Acute upper respiratory infection, unspecified: Secondary | ICD-10-CM | POA: Diagnosis not present

## 2020-10-22 DIAGNOSIS — J4541 Moderate persistent asthma with (acute) exacerbation: Secondary | ICD-10-CM | POA: Diagnosis not present

## 2020-11-09 DIAGNOSIS — R7989 Other specified abnormal findings of blood chemistry: Secondary | ICD-10-CM | POA: Insufficient documentation

## 2020-11-09 DIAGNOSIS — J45909 Unspecified asthma, uncomplicated: Secondary | ICD-10-CM | POA: Insufficient documentation

## 2020-11-09 DIAGNOSIS — I890 Lymphedema, not elsewhere classified: Secondary | ICD-10-CM | POA: Insufficient documentation

## 2020-11-18 ENCOUNTER — Emergency Department (HOSPITAL_COMMUNITY)
Admission: EM | Admit: 2020-11-18 | Discharge: 2020-11-18 | Disposition: A | Discharge status: Home / Self Care | Payer: BC Managed Care – PPO | Attending: Emergency Medicine | Admitting: Emergency Medicine

## 2020-11-18 ENCOUNTER — Emergency Department (HOSPITAL_COMMUNITY): Payer: BC Managed Care – PPO

## 2020-11-18 ENCOUNTER — Encounter (HOSPITAL_COMMUNITY): Payer: Self-pay | Admitting: Emergency Medicine

## 2020-11-18 DIAGNOSIS — J45909 Unspecified asthma, uncomplicated: Secondary | ICD-10-CM | POA: Insufficient documentation

## 2020-11-18 DIAGNOSIS — R0602 Shortness of breath: Secondary | ICD-10-CM | POA: Insufficient documentation

## 2020-11-18 DIAGNOSIS — R062 Wheezing: Secondary | ICD-10-CM | POA: Diagnosis not present

## 2020-11-18 DIAGNOSIS — R059 Cough, unspecified: Secondary | ICD-10-CM | POA: Diagnosis not present

## 2020-11-18 DIAGNOSIS — Z5321 Procedure and treatment not carried out due to patient leaving prior to being seen by health care provider: Secondary | ICD-10-CM | POA: Diagnosis not present

## 2020-11-18 LAB — BASIC METABOLIC PANEL
Anion gap: 7 (ref 5–15)
BUN: 8 mg/dL (ref 6–20)
CO2: 27 mmol/L (ref 22–32)
Calcium: 8.6 mg/dL — ABNORMAL LOW (ref 8.9–10.3)
Chloride: 105 mmol/L (ref 98–111)
Creatinine, Ser: 1.04 mg/dL — ABNORMAL HIGH (ref 0.44–1.00)
GFR, Estimated: >60 mL/min (ref >60)
Glucose, Bld: 96 mg/dL (ref 70–99)
Potassium: 3.9 mmol/L (ref 3.5–5.1)
Sodium: 139 mmol/L (ref 135–145)

## 2020-11-18 LAB — CBC
HCT: 44.2 % (ref 36.0–46.0)
Hemoglobin: 13.6 g/dL (ref 12.0–15.0)
MCH: 26.5 pg (ref 26.0–34.0)
MCHC: 30.8 g/dL (ref 30.0–36.0)
MCV: 86.2 fL (ref 80.0–100.0)
Platelets: 224 10*3/uL (ref 150–400)
RBC: 5.13 MIL/uL — ABNORMAL HIGH (ref 3.87–5.11)
RDW: 15.1 % (ref 11.5–15.5)
WBC: 6 10*3/uL (ref 4.0–10.5)
nRBC: 0 % (ref 0.0–0.2)

## 2020-11-18 NOTE — ED Notes (Signed)
No answer for vitals or room

## 2020-11-18 NOTE — ED Triage Notes (Signed)
Pt reports hx of asthma, reports wheezing and prod cough x1 weeks, denies fevers, chills, loss of taste or smell or diarrhea. Using inhaler and neb machine without relief.

## 2020-11-22 NOTE — Progress Notes (Deleted)
Cardiology Office Note:    Date:  11/22/2020   ID:  Janet [name], DOB Mar 12, 1966, MRN [id number]  PCP:  Patient, No Pcp Per  [facility] Cardiologist:  No primary care provider on file. *** [facility] Electrophysiologist:  None   Referring MD: No ref. provider found   Chief Complaint:  No chief complaint on file.    Patient Profile:    Janet [name] is a 55 y.o. female with:   Heart failure with mildly reduced ejection fraction   Echocardiogram 9/21: EF 46, Gr 2DD  Hypertension   Asthma  Obesity   Lymphedema    Prior CV studies: Echocardiogram 08/12/20 EF 46, mild LVH, Gr 2 DD, GLS-14.3% (abnormal), normal RVSF, trivial MR, mild AI, mild dilation at Sinuses of Valsalva (42 mm), mild dilation of ascending aorta (42 mm)  History of Present Illness:    Ms. Teschner was evaluated for shortness of breath by Dr. Elease Hashimoto in 9/21.  An echocardiogram demonstrated an EF of 46%.  Her medications were adjusted and she returns for further titration of GDMT.  ***      Past Medical History:  Diagnosis Date  . Asthma   . Depression   . Elevated brain natriuretic peptide (BNP) level   . Hypertension   . Lymphedema   . SOB (shortness of breath)     Current Medications: No outpatient medications have been marked as taking for the 11/23/20 encounter (Appointment) with Tereso Newcomer T, PA-C.     Allergies:   Biaxin [clarithromycin] and Penicillins   Social History   Tobacco Use  . Smoking status: Never Smoker  . Smokeless tobacco: Never Used  Vaping Use  . Vaping Use: Never used  Substance Use Topics  . Alcohol use: Yes    Comment: occasional  . Drug use: Yes    Types: Marijuana    Comment: once/month     Family Hx: The patient's family history includes Congestive Heart Failure in her maternal grandmother and mother; Deep vein thrombosis in her mother; Hypertension in her mother; Obesity in her brother and sister; Stroke in her mother.  ROS    EKGs/Labs/Other Test Reviewed:    EKG:  EKG is *** ordered today.  The ekg ordered today demonstrates ***  Recent Labs: 11/18/2020: BUN 8; Creatinine, Ser 1.04; Hemoglobin 13.6; Platelets 224; Potassium 3.9; Sodium 139   Recent Lipid Panel No results found for: CHOL, TRIG, HDL, CHOLHDL, LDLCALC, LDLDIRECT    Risk Assessment/Calculations:   {Does this patient have ATRIAL FIBRILLATION?:[phone]}  Physical Exam:    VS:  There were no vitals taken for this visit.    Wt Readings from Last 3 Encounters:  07/28/20 291 lb 6.4 oz (132.2 kg)     Physical Exam ***  ASSESSMENT & PLAN:    ***  {Are you ordering a CV Procedure (e.g. stress test, cath, DCCV, TEE, etc)?   Press F2        :[id number]}    Dispo:  No follow-ups on file.   Medication Adjustments/Labs and Tests Ordered: Current medicines are reviewed at length with the patient today.  Concerns regarding medicines are outlined above.  Tests Ordered: No orders of the defined types were placed in this encounter.  Medication Changes: No orders of the defined types were placed in this encounter.   Signed, Tereso Newcomer, PA-C  11/22/2020 10:07 PM    [facility] [address] [place], [place], [place]  [address] Phone: [phone]; Fax: [phone]

## 2020-11-23 ENCOUNTER — Ambulatory Visit: Payer: BC Managed Care – PPO | Admitting: Physician Assistant

## 2020-11-23 DIAGNOSIS — I5020 Unspecified systolic (congestive) heart failure: Secondary | ICD-10-CM

## 2020-11-23 DIAGNOSIS — I10 Essential (primary) hypertension: Secondary | ICD-10-CM

## 2020-12-08 ENCOUNTER — Encounter (HOSPITAL_COMMUNITY): Payer: Self-pay | Admitting: Emergency Medicine

## 2020-12-08 ENCOUNTER — Ambulatory Visit (HOSPITAL_COMMUNITY)
Admission: EM | Admit: 2020-12-08 | Discharge: 2020-12-08 | Disposition: A | Discharge status: Home / Self Care | Payer: BC Managed Care – PPO | Attending: Emergency Medicine | Admitting: Emergency Medicine

## 2020-12-08 ENCOUNTER — Ambulatory Visit (INDEPENDENT_AMBULATORY_CARE_PROVIDER_SITE_OTHER): Payer: BC Managed Care – PPO

## 2020-12-08 ENCOUNTER — Other Ambulatory Visit: Payer: Self-pay

## 2020-12-08 DIAGNOSIS — U071 COVID-19: Secondary | ICD-10-CM | POA: Diagnosis not present

## 2020-12-08 DIAGNOSIS — J4541 Moderate persistent asthma with (acute) exacerbation: Secondary | ICD-10-CM | POA: Diagnosis not present

## 2020-12-08 DIAGNOSIS — R0602 Shortness of breath: Secondary | ICD-10-CM | POA: Diagnosis not present

## 2020-12-08 DIAGNOSIS — R059 Cough, unspecified: Secondary | ICD-10-CM | POA: Diagnosis not present

## 2020-12-08 MED ORDER — PREDNISONE 10 MG PO TABS: 40.0000 mg | ORAL_TABLET | Freq: Every day | ORAL | 0 refills | Status: AC

## 2020-12-08 NOTE — ED Provider Notes (Signed)
[place]    CSN: [id number] Arrival date & time: 12/08/20  1704      History   Chief Complaint Chief Complaint  Patient presents with  . Wheezing    HPI Janet [name] is a 55 y.o. female.   Patient presents with 2-week history of wheezing, shortness of breath, and cough productive of tan-colored phlegm.  She tested COVID-positive 6 days ago.  She denies fever, chills, chest pain, abdominal pain, diarrhea, or other symptoms.  Treatment attempted at home with albuterol.  Her medical history includes asthma, dyspnea on exertion, hypertension, morbid obesity, depression.  The history is provided by the patient and medical records.    Past Medical History:  Diagnosis Date  . Asthma   . Depression   . Elevated brain natriuretic peptide (BNP) level   . Hypertension   . Lymphedema   . SOB (shortness of breath)     Patient Active Problem List   Diagnosis Date Noted  . Asthma   . Lymphedema   . Elevated brain natriuretic peptide (BNP) level   . DOE (dyspnea on exertion) 07/28/2020  . Morbid obesity ([place]) 07/28/2020    Past Surgical History:  Procedure Laterality Date  . ABDOMINAL HYSTERECTOMY    . CESAREAN SECTION    . HERNIA REPAIR    . TUBAL LIGATION      OB History   No obstetric history on file.      Home Medications    Prior to Admission medications   Medication Sig Start Date End Date Taking? Authorizing Provider  albuterol (PROVENTIL) (5 MG/ML) 0.5% nebulizer solution Take 2.5 mg by nebulization every 6 (six) hours as needed for wheezing or shortness of breath.   Yes [provider]  budesonide-formoterol (SYMBICORT) 160-4.5 MCG/ACT inhaler Inhale 2 puffs into the lungs 2 (two) times daily.   Yes [provider]  losartan-hydrochlorothiazide (HYZAAR) 100-12.5 MG tablet Take 1 tablet by mouth daily. 01/24/20  Yes Scot Jun, FNP  predniSONE (DELTASONE) 10 MG tablet Take 4 tablets (40 mg total) by mouth daily for 5  days. 12/08/20 12/13/20 Yes Sharion Balloon, NP  ARIPiprazole (ABILIFY) 10 MG tablet Take 10 mg by mouth daily.    [provider]  montelukast (SINGULAIR) 10 MG tablet Take 10 mg by mouth at bedtime.    [provider]  promethazine-dextromethorphan (PROMETHAZINE-DM) 6.25-15 MG/5ML syrup Take 5 mLs by mouth 4 (four) times daily as needed for cough. 01/24/20   Scot Jun, FNP  traZODone (DESYREL) 100 MG tablet Take 100 mg by mouth at bedtime.    [provider]    Family History Family History  Problem Relation Age of Onset  . Hypertension Mother   . Stroke Mother   . Deep vein thrombosis Mother   . Congestive Heart Failure Mother   . Obesity Sister   . Obesity Brother   . Congestive Heart Failure Maternal Grandmother     Social History Social History   Tobacco Use  . Smoking status: Never Smoker  . Smokeless tobacco: Never Used  Vaping Use  . Vaping Use: Never used  Substance Use Topics  . Alcohol use: Yes    Comment: occasional  . Drug use: Yes    Types: Marijuana    Comment: once/month     Allergies   Biaxin [clarithromycin] and Penicillins   Review of Systems Review of Systems  Constitutional: Negative for chills and fever.  HENT: Negative for ear pain and sore throat.  Eyes: Negative for pain and visual disturbance.  Respiratory: Positive for cough, shortness of breath and wheezing.   Cardiovascular: Negative for chest pain and palpitations.  Gastrointestinal: Negative for abdominal pain, diarrhea and vomiting.  Genitourinary: Negative for dysuria and hematuria.  Musculoskeletal: Negative for arthralgias and back pain.  Skin: Negative for color change and rash.  Neurological: Negative for seizures and syncope.  All other systems reviewed and are negative.    Physical Exam Triage Vital Signs ED Triage Vitals  Enc Vitals Group     BP 12/08/20 1812 136/80     Pulse Rate 12/08/20 1812 79     Resp 12/08/20 1812 20     Temp  12/08/20 1812 99.1 F (37.3 C)     Temp Source 12/08/20 1812 Oral     SpO2 12/08/20 1812 95 %     Weight --      Height --      Head Circumference --      Peak Flow --      Pain Score 12/08/20 1807 0     Pain Loc --      Pain Edu? --      Excl. in [place]? --    No data found.  Updated Vital Signs BP 136/80 (BP Location: Right Arm)   Pulse 79   Temp 99.1 F (37.3 C) (Oral)   Resp 20   SpO2 95%   Visual Acuity Right Eye Distance:   Left Eye Distance:   Bilateral Distance:    Right Eye Near:   Left Eye Near:    Bilateral Near:     Physical Exam Vitals and nursing note reviewed.  Constitutional:      General: She is not in acute distress.    Appearance: She is well-developed and well-nourished. She is not ill-appearing.  HENT:     Head: Normocephalic and atraumatic.     Mouth/Throat:     Mouth: Mucous membranes are moist.  Eyes:     Conjunctiva/sclera: Conjunctivae normal.  Cardiovascular:     Rate and Rhythm: Normal rate and regular rhythm.     Heart sounds: Normal heart sounds.  Pulmonary:     Effort: Pulmonary effort is normal. No respiratory distress.     Breath sounds: Wheezing present.     Comments: Few expiratory wheezes. No respiratory distress.   Abdominal:     Palpations: Abdomen is soft.     Tenderness: There is no abdominal tenderness. There is no guarding or rebound.  Musculoskeletal:        General: No edema.     Cervical back: Neck supple.  Skin:    General: Skin is warm and dry.  Neurological:     General: No focal deficit present.     Mental Status: She is alert and oriented to person, place, and time.     Gait: Gait normal.  Psychiatric:        Mood and Affect: Mood and affect and mood normal.        Behavior: Behavior normal.      UC Treatments / Results  Labs (all labs ordered are listed, but only abnormal results are displayed) Labs Reviewed - No data to display  EKG   Radiology DG Chest 2 View  Result Date:  12/08/2020 CLINICAL DATA:  COVID-19 positivity with productive cough and shortness of breath EXAM: CHEST - 2 VIEW COMPARISON:  11/18/2020 FINDINGS: Cardiac shadow is within normal limits. The lungs are well aerated bilaterally. No focal infiltrate  or sizable effusion is seen. No bony abnormality is noted. IMPRESSION: No active cardiopulmonary disease. Electronically Signed   By: Inez Catalina M.D.   On: 12/08/2020 19:49    Procedures Procedures (including critical care time)  Medications Ordered in UC Medications - No data to display  Initial Impression / Assessment and Plan / UC Course  I have reviewed the triage vital signs and the nursing notes.  Pertinent labs & imaging results that were available during my care of the patient were reviewed by me and considered in my medical decision making (see chart for details).   Asthma exacerbation, COVID-19.  Chest x-ray negative.  Treating with prednisone burst and continued use of albuterol.  Instructed patient to establish a primary care provider and suggestion provided.  Patient agrees to plan of care.   Final Clinical Impressions(s) / UC Diagnoses   Final diagnoses:  Moderate persistent asthma with exacerbation  COVID-19     Discharge Instructions     Take the prednisone as directed.  Continue to use your albuterol.    Establish a primary care provider such as the one listed below.        ED Prescriptions    Medication Sig Dispense Auth. Provider   predniSONE (DELTASONE) 10 MG tablet Take 4 tablets (40 mg total) by mouth daily for 5 days. 20 tablet Sharion Balloon, NP     PDMP not reviewed this encounter.   Sharion Balloon, NP 12/08/20 1958

## 2020-12-08 NOTE — ED Triage Notes (Signed)
Patient c/o wheezing x 2 weeks.   Patient endorses being COVID-19 positive x 6 days.   History of Asthma.   Patient has used Albuterol, Nebulizer, and Symbicort w/ no relief of symptoms.

## 2020-12-08 NOTE — Discharge Instructions (Addendum)
Take the prednisone as directed.  Continue to use your albuterol.    Establish a primary care provider such as the one listed below.

## 2020-12-22 ENCOUNTER — Telehealth (INDEPENDENT_AMBULATORY_CARE_PROVIDER_SITE_OTHER): Payer: BC Managed Care – PPO | Admitting: Family

## 2020-12-22 ENCOUNTER — Encounter: Payer: Self-pay | Admitting: Family

## 2020-12-22 DIAGNOSIS — J45909 Unspecified asthma, uncomplicated: Secondary | ICD-10-CM | POA: Diagnosis not present

## 2020-12-22 DIAGNOSIS — Z7689 Persons encountering health services in other specified circumstances: Secondary | ICD-10-CM

## 2020-12-22 MED ORDER — ALBUTEROL SULFATE HFA 108 (90 BASE) MCG/ACT IN AERS: 1.0000 | INHALATION_SPRAY | Freq: Four times a day (QID) | RESPIRATORY_TRACT | 1 refills | Status: DC | PRN

## 2020-12-22 MED ORDER — ALBUTEROL SULFATE (5 MG/ML) 0.5% IN NEBU: 2.5000 mg | INHALATION_SOLUTION | Freq: Four times a day (QID) | RESPIRATORY_TRACT | 0 refills | Status: DC | PRN

## 2020-12-22 MED ORDER — BUDESONIDE-FORMOTEROL FUMARATE 160-4.5 MCG/ACT IN AERO
2.0000 | INHALATION_SPRAY | Freq: Two times a day (BID) | RESPIRATORY_TRACT | 2 refills | Status: DC
Start: 2020-12-22 — End: 2021-03-21

## 2020-12-22 NOTE — Progress Notes (Unsigned)
Virtual Visit via Telephone Note  I connected with Janet [name], on 12/22/2020 at 1:48 PM by telephone due to the COVID-19 pandemic and verified that I am speaking with the correct person using two identifiers.  Due to current restrictions/limitations of in-office visits due to the COVID-19 pandemic, this scheduled clinical appointment was converted to a telehealth visit.   Consent: I discussed the limitations, risks, security and privacy concerns of performing an evaluation and management service by telephone and the availability of in person appointments. I also discussed with the patient that there may be a patient responsible charge related to this service. The patient expressed understanding and agreed to proceed.  Location of Patient: Home  Location of Provider: [facility] [place] participating in Telemedicine visit: Janet [name] Durene Fruits, NP Elmon Else, CMA  History of Present Illness: Janet [name] is a 55 year-old female who presents to establish care. History of asthma, dyspnea on exertion, morbid obesity, lymphedema, and elevated brain natriuretic peptide level.   Current issues and/or concerns: 1. ASTHMA FOLLOW-UP: 12/08/2020 per PA note: Visit at [facility] with moderate persistent asthma with exacerbation and Covid-19. Chest x-ray negative. Treating with prednisone burst and continued use of albuterol.  Instructed patient to establish a primary care provider and suggestion provided. Patient agrees to plan of care.  12/22/2020: Asthma status: exacerbated Satisfied with current treatment?: yes Albuterol/rescue inhaler frequency: every 3 hours since running out of maintenance inhaler Symbicort 5 days ago Dyspnea frequency: Yes   Wheezing frequency: Yes  Cough frequency: Yes  Nocturnal symptom frequency: Yes  Limitation of activity: Sometimes Triggers: cold weather   Past Medical History:   Diagnosis Date  . Asthma   . Depression   . Elevated brain natriuretic peptide (BNP) level   . Hypertension   . Lymphedema   . SOB (shortness of breath)    Allergies  Allergen Reactions  . Biaxin [Clarithromycin]     GI upset  . Penicillins Rash    Current Outpatient Medications on File Prior to Visit  Medication Sig Dispense Refill  . albuterol (PROVENTIL) (5 MG/ML) 0.5% nebulizer solution Take 2.5 mg by nebulization every 6 (six) hours as needed for wheezing or shortness of breath.    . ARIPiprazole (ABILIFY) 10 MG tablet Take 10 mg by mouth daily.    . budesonide-formoterol (SYMBICORT) 160-4.5 MCG/ACT inhaler Inhale 2 puffs into the lungs 2 (two) times daily.    Marland Kitchen losartan-hydrochlorothiazide (HYZAAR) 100-12.5 MG tablet Take 1 tablet by mouth daily. 30 tablet 1  . montelukast (SINGULAIR) 10 MG tablet Take 10 mg by mouth at bedtime.    . promethazine-dextromethorphan (PROMETHAZINE-DM) 6.25-15 MG/5ML syrup Take 5 mLs by mouth 4 (four) times daily as needed for cough. 140 mL 0  . traZODone (DESYREL) 100 MG tablet Take 100 mg by mouth at bedtime.     No current facility-administered medications on file prior to visit.    Observations/Objective: Alert and oriented x 3. Not in acute distress. Physical examination not completed as this is a telemedicine visit.  Assessment and Plan: 1. Encounter to establish care: - Patient presents today to establish care.  - Return in 4 to 6 weeks or sooner if needed for annual physical examination, labs, and health maintenance. Arrive fasting meaning having had no food and/or nothing to drink for at least 8 hours prior to appointment.  2. Moderate asthma, unspecified whether complicated, unspecified whether persistent: - Continue Albuterol inhaler, Albuterol  nebulizer solution, and Budesonide-Formoterol inhaler as prescribed.  - Follow-up with primary provider as needed.  - budesonide-formoterol (SYMBICORT) 160-4.5 MCG/ACT inhaler; Inhale 2  puffs into the lungs 2 (two) times daily.  Dispense: 1 each; Refill: 2 - albuterol (VENTOLIN HFA) 108 (90 Base) MCG/ACT inhaler; Inhale 1-2 puffs into the lungs every 6 (six) hours as needed for wheezing or shortness of breath.  Dispense: 1 each; Refill: 1 - albuterol (PROVENTIL) (5 MG/ML) 0.5% nebulizer solution; Take 0.5 mLs (2.5 mg total) by nebulization every 6 (six) hours as needed for wheezing or shortness of breath.  Dispense: 20 mL; Refill: 0  Follow Up Instructions: Follow-up with primary provider as needed.    Patient was given clear instructions to go to Emergency Department or return to medical center if symptoms don't improve, worsen, or new problems develop.The patient verbalized understanding.  I discussed the assessment and treatment plan with the patient. The patient was provided an opportunity to ask questions and all were answered. The patient agreed with the plan and demonstrated an understanding of the instructions.   The patient was advised to call back or seek an in-person evaluation if the symptoms worsen or if the condition fails to improve as anticipated.   I provided 15 minutes total of non-face-to-face time during this encounter including median intraservice time, reviewing previous notes, labs, imaging, medications, management and patient verbalized understanding.    Camillia Herter, NP  [facility] at [facility] [place], [place] 12/22/2020, 8:21 AM

## 2020-12-22 NOTE — Progress Notes (Signed)
Establish care  Covid positive 2 weeks ago  Wheezing  Needs refill on Symbicort

## 2020-12-31 ENCOUNTER — Telehealth: Payer: Self-pay | Admitting: General Practice

## 2020-12-31 NOTE — Telephone Encounter (Signed)
Pt's wheezing has continued since last appt. Pt states she was told by PCP to inform PCP if this persist to proceed with treatment. Pt is inquiring if pt needs to be seen before March appt. Please advise and thank you.

## 2021-01-03 NOTE — Telephone Encounter (Signed)
Spoke w/pt. Appt scheduled for 2/23

## 2021-01-12 ENCOUNTER — Encounter: Payer: Self-pay | Admitting: Family

## 2021-01-12 ENCOUNTER — Ambulatory Visit (INDEPENDENT_AMBULATORY_CARE_PROVIDER_SITE_OTHER): Payer: BC Managed Care – PPO | Admitting: Family

## 2021-01-12 ENCOUNTER — Other Ambulatory Visit: Payer: Self-pay

## 2021-01-12 VITALS — BP 123/88 | HR 76 | Ht 67.56 in | Wt 279.0 lb

## 2021-01-12 DIAGNOSIS — Z13220 Encounter for screening for lipoid disorders: Secondary | ICD-10-CM | POA: Diagnosis not present

## 2021-01-12 DIAGNOSIS — Z23 Encounter for immunization: Secondary | ICD-10-CM

## 2021-01-12 DIAGNOSIS — Z130 Encounter for screening for diseases of the blood and blood-forming organs and certain disorders involving the immune mechanism: Secondary | ICD-10-CM

## 2021-01-12 DIAGNOSIS — Z1211 Encounter for screening for malignant neoplasm of colon: Secondary | ICD-10-CM

## 2021-01-12 DIAGNOSIS — Z114 Encounter for screening for human immunodeficiency virus [HIV]: Secondary | ICD-10-CM | POA: Diagnosis not present

## 2021-01-12 DIAGNOSIS — Z0000 Encounter for general adult medical examination without abnormal findings: Secondary | ICD-10-CM | POA: Diagnosis not present

## 2021-01-12 DIAGNOSIS — Z131 Encounter for screening for diabetes mellitus: Secondary | ICD-10-CM | POA: Diagnosis not present

## 2021-01-12 DIAGNOSIS — Z13228 Encounter for screening for other metabolic disorders: Secondary | ICD-10-CM | POA: Diagnosis not present

## 2021-01-12 DIAGNOSIS — I10 Essential (primary) hypertension: Secondary | ICD-10-CM

## 2021-01-12 DIAGNOSIS — J45909 Unspecified asthma, uncomplicated: Secondary | ICD-10-CM

## 2021-01-12 DIAGNOSIS — Z1329 Encounter for screening for other suspected endocrine disorder: Secondary | ICD-10-CM

## 2021-01-12 DIAGNOSIS — I890 Lymphedema, not elsewhere classified: Secondary | ICD-10-CM

## 2021-01-12 DIAGNOSIS — Z1159 Encounter for screening for other viral diseases: Secondary | ICD-10-CM | POA: Diagnosis not present

## 2021-01-12 DIAGNOSIS — Z124 Encounter for screening for malignant neoplasm of cervix: Secondary | ICD-10-CM

## 2021-01-12 DIAGNOSIS — Z1231 Encounter for screening mammogram for malignant neoplasm of breast: Secondary | ICD-10-CM

## 2021-01-12 MED ORDER — PREDNISONE 10 MG PO TABS: ORAL_TABLET | ORAL | 0 refills | Status: AC

## 2021-01-12 MED ORDER — MONTELUKAST SODIUM 10 MG PO TABS: 10.0000 mg | ORAL_TABLET | Freq: Every day | ORAL | 0 refills | Status: DC

## 2021-01-12 NOTE — Progress Notes (Unsigned)
Physical exam Pt wheezing/SOB has not stopped since pt had COVID  Pt needs referral for lymphedema  Needs refill on Singulair 10mg   Needs referral to [facility]

## 2021-01-12 NOTE — Patient Instructions (Addendum)
Annual physical exam and labs today. Will call with results.   Referral for mammogram.  Referral for PAP smear.  Referral for colonoscopy.   Referral to [facility] for lymphedema management. Located at [place],  [place]  [address]. Phone # [phone].  Refill Singulair.   Continue Albuterol and Symbicort for asthma/shortness of breath. Follow-up with primary provider as needed.   Prednisone taper for asthma flare.   Preventive Care 75-54 Years Old, Female Preventive care refers to lifestyle choices and visits with your health care provider that can promote health and wellness. This includes:  A yearly physical exam. This is also called an annual wellness visit.  Regular dental and eye exams.  Immunizations.  Screening for certain conditions.  Healthy lifestyle choices, such as: ? Eating a healthy diet. ? Getting regular exercise. ? Not using drugs or products that contain nicotine and tobacco. ? Limiting alcohol use. What can I expect for my preventive care visit? Physical exam Your health care provider will check your:  Height and weight. These may be used to calculate your BMI (body mass index). BMI is a measurement that tells if you are at a healthy weight.  Heart rate and blood pressure.  Body temperature.  Skin for abnormal spots. Counseling Your health care provider may ask you questions about your:  Past medical problems.  Family's medical history.  Alcohol, tobacco, and drug use.  Emotional well-being.  Home life and relationship well-being.  Sexual activity.  Diet, exercise, and sleep habits.  Work and work Statistician.  Access to firearms.  Method of birth control.  Menstrual cycle.  Pregnancy history. What immunizations do I need? Vaccines are usually given at various ages, according to a schedule. Your health care provider will recommend vaccines for you based on your  age, medical history, and lifestyle or other factors, such as travel or where you work.   What tests do I need? Blood tests  Lipid and cholesterol levels. These may be checked every 5 years, or more often if you are over 15 years old.  Hepatitis C test.  Hepatitis B test. Screening  Lung cancer screening. You may have this screening every year starting at age 54 if you have a 30-pack-year history of smoking and currently smoke or have quit within the past 15 years.  Colorectal cancer screening. ? All adults should have this screening starting at age 50 and continuing until age 54. ? Your health care provider may recommend screening at age 82 if you are at increased risk. ? You will have tests every 1-10 years, depending on your results and the type of screening test.  Diabetes screening. ? This is done by checking your blood sugar (glucose) after you have not eaten for a while (fasting). ? You may have this done every 1-3 years.  Mammogram. ? This may be done every 1-2 years. ? Talk with your health care provider about when you should start having regular mammograms. This may depend on whether you have a family history of breast cancer.  BRCA-related cancer screening. This may be done if you have a family history of breast, ovarian, tubal, or peritoneal cancers.  Pelvic exam and Pap test. ? This may be done every 3 years starting at age 54. ? Starting at age 34, this may be done every 5 years if you have a Pap test in combination with an HPV test. Other tests  STD (sexually transmitted disease) testing,  if you are at risk.  Bone density scan. This is done to screen for osteoporosis. You may have this scan if you are at high risk for osteoporosis. Talk with your health care provider about your test results, treatment options, and if necessary, the need for more tests. Follow these instructions at home: Eating and drinking  Eat a diet that includes fresh fruits and vegetables,  whole grains, lean protein, and low-fat dairy products.  Take vitamin and mineral supplements as recommended by your health care provider.  Do not drink alcohol if: ? Your health care provider tells you not to drink. ? You are pregnant, may be pregnant, or are planning to become pregnant.  If you drink alcohol: ? Limit how much you have to 0-1 drink a day. ? Be aware of how much alcohol is in your drink. In the [place] one drink equals one 12 oz bottle of beer (355 mL), one 5 oz glass of wine (148 mL), or one 1 oz glass of hard liquor (44 mL).   Lifestyle  Take daily care of your teeth and gums. Brush your teeth every morning and night with fluoride toothpaste. Floss one time each day.  Stay active. Exercise for at least 30 minutes 5 or more days each week.  Do not use any products that contain nicotine or tobacco, such as cigarettes, e-cigarettes, and chewing tobacco. If you need help quitting, ask your health care provider.  Do not use drugs.  If you are sexually active, practice safe sex. Use a condom or other form of protection to prevent STIs (sexually transmitted infections).  If you do not wish to become pregnant, use a form of birth control. If you plan to become pregnant, see your health care provider for a prepregnancy visit.  If told by your health care provider, take low-dose aspirin daily starting at age 49.  Find healthy ways to cope with stress, such as: ? Meditation, yoga, or listening to music. ? Journaling. ? Talking to a trusted person. ? Spending time with friends and family. Safety  Always wear your seat belt while driving or riding in a vehicle.  Do not drive: ? If you have been drinking alcohol. Do not ride with someone who has been drinking. ? When you are tired or distracted. ? While texting.  Wear a helmet and other protective equipment during sports activities.  If you have firearms in your house, make sure you follow all gun safety  procedures. What's next?  Visit your health care provider once a year for an annual wellness visit.  Ask your health care provider how often you should have your eyes and teeth checked.  Stay up to date on all vaccines. This information is not intended to replace advice given to you by your health care provider. Make sure you discuss any questions you have with your health care provider. Document Revised: 08/10/2020 Document Reviewed: 07/18/2018 Elsevier Patient Education  2021 [employer].

## 2021-01-12 NOTE — Progress Notes (Signed)
Patient ID: Janet [name], female    DOB: September 23, 1966  MRN: [id number]  CC: Annual Physical Exam  Subjective: Janet [name] is a 55 y.o. female who presents for annual physical exam.  Her concerns today include:   1. SHORTNESS OF BREATH: Continued shortness of breath since diagnosis of Covid in January 2022. Shortness of breath occurs mostly with activity. Does have productive cough with white phlegm. Does have history of asthma and feels this may be contributing to increased shortness of breath. Taking Albuterol as prescribed. About two weeks ago did begin taking Symbicort inhaler again which has helped. Not ready to see a Pulmonologist yet.   2. LYMPHEDEMA: History of lymphedema since 10th grade. Has used compression stockings in the past but not currently using any. Also, in the past attended a Lymphedema Clinic in [facility] which was helpful. Swelling does fluctuate. Would like referral for further assistance.   Patient Active Problem List   Diagnosis Date Noted  . Asthma   . Lymphedema   . Elevated brain natriuretic peptide (BNP) level   . DOE (dyspnea on exertion) 07/28/2020  . Morbid obesity ([place]) 07/28/2020     Current Outpatient Medications on File Prior to Visit  Medication Sig Dispense Refill  . albuterol (PROVENTIL) (5 MG/ML) 0.5% nebulizer solution Take 0.5 mLs (2.5 mg total) by nebulization every 6 (six) hours as needed for wheezing or shortness of breath. 20 mL 0  . albuterol (VENTOLIN HFA) 108 (90 Base) MCG/ACT inhaler Inhale 1-2 puffs into the lungs every 6 (six) hours as needed for wheezing or shortness of breath. 1 each 1  . ARIPiprazole (ABILIFY) 10 MG tablet Take 10 mg by mouth daily.    . budesonide-formoterol (SYMBICORT) 160-4.5 MCG/ACT inhaler Inhale 2 puffs into the lungs 2 (two) times daily. 1 each 2  . fluticasone (FLONASE) 50 MCG/ACT nasal spray Place into both nostrils.    . hydrOXYzine (ATARAX/VISTARIL) 10 MG tablet Take 10 mg by  mouth 2 (two) times daily.    Marland Kitchen ibuprofen (ADVIL) 600 MG tablet Take by mouth at bedtime.    . lamoTRIgine (LAMICTAL) 100 MG tablet Take 100 mg by mouth daily.    Marland Kitchen losartan-hydrochlorothiazide (HYZAAR) 100-12.5 MG tablet Take 1 tablet by mouth daily. 30 tablet 1  . traZODone (DESYREL) 100 MG tablet Take 100 mg by mouth at bedtime.     No current facility-administered medications on file prior to visit.    Allergies  Allergen Reactions  . Biaxin [Clarithromycin]     GI upset  . Penicillins Rash    Social History   Socioeconomic History  . Marital status: Single    Spouse name: Not on file  . Number of children: Not on file  . Years of education: Not on file  . Highest education level: Not on file  Occupational History  . Not on file  Tobacco Use  . Smoking status: Passive Smoke Exposure - Never Smoker  . Smokeless tobacco: Never Used  Vaping Use  . Vaping Use: Never used  Substance and Sexual Activity  . Alcohol use: Yes    Comment: occasional  . Drug use: Yes    Types: Marijuana    Comment: once/month  . Sexual activity: Yes    Birth control/protection: Surgical  Other Topics Concern  . Not on file  Social History Narrative  . Not on file   Social Determinants of Health   Financial Resource Strain: Not on file  Food Insecurity: Not on file  Transportation Needs: Not on file  Physical Activity: Not on file  Stress: Not on file  Social Connections: Not on file  Intimate Partner Violence: Not on file    Family History  Problem Relation Age of Onset  . Hypertension Mother   . Stroke Mother   . Deep vein thrombosis Mother   . Congestive Heart Failure Mother   . Obesity Sister   . Obesity Brother   . Congestive Heart Failure Maternal Grandmother     Past Surgical History:  Procedure Laterality Date  . ABDOMINAL HYSTERECTOMY    . CESAREAN SECTION    . HERNIA REPAIR    . TUBAL LIGATION      ROS: Review of Systems Negative except as stated  above  PHYSICAL EXAM: BP 123/88 (BP Location: Left Arm, Patient Position: Sitting)   Pulse 76   Ht 5' 7.56" (1.716 m)   Wt 279 lb (126.6 kg)   SpO2 95%   BMI 42.98 kg/m   Wt Readings from Last 3 Encounters:  01/12/21 279 lb (126.6 kg)  07/28/20 291 lb 6.4 oz (132.2 kg)    Physical Exam Constitutional:      Appearance: She is obese.  HENT:     Head: Normocephalic and atraumatic.     Right Ear: Tympanic membrane, ear canal and external ear normal.     Left Ear: Tympanic membrane, ear canal and external ear normal.     Nose: Nose normal.     Mouth/Throat:     Mouth: Mucous membranes are moist.     Pharynx: Oropharynx is clear.  Eyes:     Extraocular Movements: Extraocular movements intact.     Conjunctiva/sclera: Conjunctivae normal.     Pupils: Pupils are equal, round, and reactive to light.  Cardiovascular:     Rate and Rhythm: Normal rate and regular rhythm.     Pulses: Normal pulses.     Heart sounds: Normal heart sounds.  Pulmonary:     Effort: Pulmonary effort is normal.     Breath sounds: Normal breath sounds.  Abdominal:     General: Bowel sounds are normal.     Palpations: Abdomen is soft.  Genitourinary:    Comments: Patient declined examination.  Musculoskeletal:     Cervical back: Normal range of motion and neck supple.     Right lower leg: Swelling present.     Left lower leg: Swelling present.     Comments: Lymphedema bilateral lower extremities.  Skin:    General: Skin is warm and dry.  Neurological:     General: No focal deficit present.     Mental Status: She is alert and oriented to person, place, and time.  Psychiatric:        Mood and Affect: Mood normal.        Behavior: Behavior normal.     ASSESSMENT AND PLAN: 1. Annual physical exam: - Counseled on 150 minutes of exercise per week as tolerated, healthy eating (including decreased daily intake of saturated fats, cholesterol, added sugars, sodium), STI prevention, and routine healthcare  maintenance.   2. Screening for metabolic disorder: - Basic Metabolic Panel last obtained 11/18/2020. - Hepatic Function Panel to check liver function.  - Hepatic Function Panel  3. Screening for deficiency anemia: - CBC last obtained 11/18/2020.  4. Diabetes mellitus screening: - Hemoglobin A1c to screen for pre-diabetes/diabetes. - Hemoglobin A1c; Future  5. Screening cholesterol level: - Lipid panel to screen for high cholesterol.  - Lipid Panel  6.  Thyroid disorder screen: - TSH to check thyroid function.  - TSH  7. Encounter for screening for HIV: - HIV antibody to screen for human immunodeficiency virus.  - HIV antibody (with reflex)  8. Need for hepatitis C screening test: - HCV antibody to screen for hepatitis C.  - HCV Ab w/Rflx to Verification  9. Encounter for screening mammogram for malignant neoplasm of breast: - Referral for breast cancer screening by mammogram. - MM Digital Screening; Future  10. Cervical cancer screening: - Referral to Gynecology for cervical cancer screening by PAP smear.  - Ambulatory referral to Gynecology  11. Colon cancer screening: - Referral to Gastroenterology for colon cancer screening by colonoscopy. - Ambulatory referral to Gastroenterology  12. Moderate asthma, unspecified whether complicated, unspecified whether persistent: - Patient stable without respiratory distress today in clinic.  - Continue Albuterol inhaler, Symbicort inhaler, and Montelukast as prescribed.  - Adding Prednisone taper for shortness of breath possibly complicated by asthma flare.  - Patient not ready to see Pulmonologist as of yet.  - Follow-up with primary provider as scheduled. - montelukast (SINGULAIR) 10 MG tablet; Take 1 tablet (10 mg total) by mouth at bedtime.  Dispense: 120 tablet; Refill: 0 - predniSONE (DELTASONE) 10 MG tablet; Take 4 tablets (40 mg total) by mouth daily with breakfast for 2 days, THEN 3 tablets (30 mg total) daily with  breakfast for 1 day, THEN 2 tablets (20 mg total) daily with breakfast for 1 day, THEN 1 tablet (10 mg total) daily with breakfast for 1 day.  Dispense: 14 tablet; Refill: 0  13. Lymph edema: - Chronic.  - Per patient request referral to [facility] for lymphedema management. Located at [place],  [place]  [address]. Phone # [phone]. - AMB referral to rehabilitation  14. Need for tetanus, diphtheria, and acellular pertussis (Tdap) vaccine: - Tdap vaccine administered during today's visit.   Patient was given the opportunity to ask questions.  Patient verbalized understanding of the plan and was able to repeat key elements of the plan. Patient was given clear instructions to go to Emergency Department or return to medical center if symptoms don't improve, worsen, or new problems develop.The patient verbalized understanding.   Orders Placed This Encounter  Procedures  . MM Digital Screening  . Hepatic Function Panel  . Lipid Panel  . TSH  . HIV antibody (with reflex)  . HCV Ab w/Rflx to Verification  . Interpretation:  . Ambulatory referral to Gastroenterology  . AMB referral to rehabilitation  . Ambulatory referral to Gynecology     Requested Prescriptions   Signed Prescriptions Disp Refills  . montelukast (SINGULAIR) 10 MG tablet 120 tablet 0    Sig: Take 1 tablet (10 mg total) by mouth at bedtime.  . predniSONE (DELTASONE) 10 MG tablet 14 tablet 0    Sig: Take 4 tablets (40 mg total) by mouth daily with breakfast for 2 days, THEN 3 tablets (30 mg total) daily with breakfast for 1 day, THEN 2 tablets (20 mg total) daily with breakfast for 1 day, THEN 1 tablet (10 mg total) daily with breakfast for 1 day.    Follow-up with primary provider as needed.   Camillia Herter, NP

## 2021-01-13 LAB — HEPATIC FUNCTION PANEL
ALT: 17 IU/L (ref 0–32)
AST: 18 IU/L (ref 0–40)
Albumin: 4.2 g/dL (ref 3.8–4.9)
Alkaline Phosphatase: 63 IU/L (ref 44–121)
Bilirubin Total: 0.4 mg/dL (ref 0.0–1.2)
Bilirubin, Direct: 0.13 mg/dL (ref 0.00–0.40)
Total Protein: 6.8 g/dL (ref 6.0–8.5)

## 2021-01-13 LAB — LIPID PANEL
Chol/HDL Ratio: 2.3 ratio (ref 0.0–4.4)
Cholesterol, Total: 170 mg/dL (ref 100–199)
HDL: 74 mg/dL (ref >39)
LDL Chol Calc (NIH): 79 mg/dL (ref 0–99)
Triglycerides: 95 mg/dL (ref 0–149)
VLDL Cholesterol Cal: 17 mg/dL (ref 5–40)

## 2021-01-13 LAB — HIV ANTIBODY (ROUTINE TESTING W REFLEX): HIV Screen 4th Generation wRfx: NONREACTIVE

## 2021-01-13 LAB — TSH: TSH: 1.52 u[IU]/mL (ref 0.450–4.500)

## 2021-01-13 LAB — HCV INTERPRETATION

## 2021-01-13 LAB — HCV AB W/RFLX TO VERIFICATION: HCV Ab: <0.1 s/co ratio (ref 0.0–0.9)

## 2021-01-13 NOTE — Progress Notes (Signed)
Please call patient with update.   Liver function normal.   Cholesterol normal.   Thyroid normal.   Hepatitis C negative.  HIV negative.   A lab to screen for diabetes added to blood work. Patient may come in for a lab only visit, please schedule while patient is on the phone.

## 2021-02-02 ENCOUNTER — Encounter: Payer: BC Managed Care – PPO | Admitting: Family

## 2021-02-14 ENCOUNTER — Encounter: Payer: Self-pay | Admitting: Obstetrics and Gynecology

## 2021-02-14 ENCOUNTER — Encounter: Payer: BC Managed Care – PPO | Admitting: Obstetrics and Gynecology

## 2021-02-15 ENCOUNTER — Other Ambulatory Visit: Payer: Self-pay

## 2021-02-15 ENCOUNTER — Telehealth: Payer: Self-pay | Admitting: Family

## 2021-02-15 NOTE — Progress Notes (Signed)
Patient did not keep her GYN appointment for 02/14/2021.  Durene Romans MD Attending Center for [employer] [facility])

## 2021-02-15 NOTE — Telephone Encounter (Signed)
Pt called stating she needs a refill on her medication  losartan-hydrochlorothiazide (HYZAAR) 100-12.5 MG tablet [id number] - [place], [place] - [address]

## 2021-02-17 DIAGNOSIS — J45909 Unspecified asthma, uncomplicated: Secondary | ICD-10-CM | POA: Diagnosis not present

## 2021-02-17 DIAGNOSIS — I10 Essential (primary) hypertension: Secondary | ICD-10-CM | POA: Insufficient documentation

## 2021-02-17 MED ORDER — LOSARTAN POTASSIUM-HCTZ 100-12.5 MG PO TABS: 1.0000 | ORAL_TABLET | Freq: Every day | ORAL | 0 refills | Status: DC

## 2021-02-17 NOTE — Telephone Encounter (Signed)
F/u   Patient out of medication x 3 days please advise.

## 2021-02-17 NOTE — Addendum Note (Signed)
Addended by: Camillia Herter on: 02/17/2021 01:03 PM   Modules accepted: Orders

## 2021-02-17 NOTE — Telephone Encounter (Signed)
Courtesy 30 day refill of Losartan-Hydrochlorothiazide per patient request. Office visit required for additional refills.

## 2021-03-16 ENCOUNTER — Other Ambulatory Visit: Payer: Self-pay | Admitting: Family

## 2021-03-16 DIAGNOSIS — I10 Essential (primary) hypertension: Secondary | ICD-10-CM

## 2021-03-20 DIAGNOSIS — I428 Other cardiomyopathies: Secondary | ICD-10-CM

## 2021-03-20 HISTORY — DX: Other cardiomyopathies: I42.8

## 2021-03-20 NOTE — Progress Notes (Signed)
Patient ID: Janet [name], female    DOB: 1966-06-27  MRN: [id number]  CC: Asthma Follow-Up  Subjective: Janet [name] is a 55 y.o. female who presents for asthma follow-up. Her concerns today include:   1. ASTHMA FOLLOW-UP: 01/12/2021: - Continue Albuterol inhaler, Symbicort inhaler, and Montelukast as prescribed.  - Adding Prednisone taper for shortness of breath possibly complicated by asthma flare.  - Patient not ready to see Pulmonologist as of yet.  - Follow-up with primary provider as scheduled.  03/21/2021: Asthma status: fluctuating Dyspnea frequency: activity and rest  Wheezing frequency: yes Cough frequency: yes  Nocturnal symptom frequency: yes  Limitation of activity: yes Triggers: Allergies make worse requesting Flonase refill  Comments: Reports since last visit she was able to acquire health insurance and ready to proceed with referral to Pulmonology.   2. HYPERTENSION: Currently taking: see medication list Med Adherence: [x]  Yes    []  No Medication side effects: []  Yes    [x]  No Adherence with salt restriction (low-salt diet): []  Yes    [x]  No Exercise: Yes []  No [x]   Home Monitoring?: []  Yes   [x]  No Monitoring Frequency: []  Yes    [x]  No Home BP results range: []  Yes    [x]  No Smoking []  Yes [x]  No SOB? [x]  Yes    []  No Chest Pain?: []  Yes    [x]  No Leg swelling?: lymph edema Headaches?: []  Yes    [x]  No Dizziness? []  Yes    [x]  No  3. LYMPH EDEMA FOLLOW-UP: 01/12/2021: - Chronic.  - Per patient request referral to [facility] for lymphedema management. Located at [place],  [place]  [address]. Phone # [phone].  03/21/2021:    Reports since last visit she was able to acquire health insurance and ready to proceed with referral to Rehabilitation lymph edema management.    Patient Active Problem List   Diagnosis Date Noted  . Essential hypertension 02/17/2021  . Asthma   .  Lymphedema   . Elevated brain natriuretic peptide (BNP) level   . DOE (dyspnea on exertion) 07/28/2020  . Morbid obesity ([place]) 07/28/2020     Current Outpatient Medications on File Prior to Visit  Medication Sig Dispense Refill  . ARIPiprazole (ABILIFY) 10 MG tablet Take 10 mg by mouth daily.    . hydrOXYzine (ATARAX/VISTARIL) 10 MG tablet Take 10 mg by mouth 2 (two) times daily.    Marland Kitchen ibuprofen (ADVIL) 600 MG tablet Take by mouth at bedtime.    . lamoTRIgine (LAMICTAL) 100 MG tablet Take 100 mg by mouth daily.    . traZODone (DESYREL) 100 MG tablet Take 100 mg by mouth at bedtime.     No current facility-administered medications on file prior to visit.    Allergies  Allergen Reactions  . Biaxin [Clarithromycin]     GI upset  . Penicillins Rash    Social History   Socioeconomic History  . Marital status: Single    Spouse name: Not on file  . Number of children: Not on file  . Years of education: Not on file  . Highest education level: Not on file  Occupational History  . Not on file  Tobacco Use  . Smoking status: Passive Smoke Exposure - Never Smoker  . Smokeless tobacco: Never Used  Vaping Use  . Vaping Use: Never used  Substance and Sexual Activity  . Alcohol use: Yes    Comment: occasional  . Drug use:  Yes    Types: Marijuana    Comment: once/month  . Sexual activity: Yes    Birth control/protection: Surgical  Other Topics Concern  . Not on file  Social History Narrative  . Not on file   Social Determinants of Health   Financial Resource Strain: Not on file  Food Insecurity: Not on file  Transportation Needs: Not on file  Physical Activity: Not on file  Stress: Not on file  Social Connections: Not on file  Intimate Partner Violence: Not on file    Family History  Problem Relation Age of Onset  . Hypertension Mother   . Stroke Mother   . Deep vein thrombosis Mother   . Congestive Heart Failure Mother   . Obesity Sister   . Obesity Brother   .  Congestive Heart Failure Maternal Grandmother     Past Surgical History:  Procedure Laterality Date  . ABDOMINAL HYSTERECTOMY    . CESAREAN SECTION    . HERNIA REPAIR    . TUBAL LIGATION      ROS: Review of Systems Negative except as stated above  PHYSICAL EXAM: BP 110/79 (BP Location: Left Arm, Patient Position: Sitting)   Pulse 80   Wt 273 lb (123.8 kg)   SpO2 92%   BMI 42.05 kg/m   Wt Readings from Last 3 Encounters:  03/21/21 273 lb (123.8 kg)  01/12/21 279 lb (126.6 kg)  07/28/20 291 lb 6.4 oz (132.2 kg)    Physical Exam HENT:     Head: Normocephalic and atraumatic.  Eyes:     Extraocular Movements: Extraocular movements intact.     Conjunctiva/sclera: Conjunctivae normal.     Pupils: Pupils are equal, round, and reactive to light.  Cardiovascular:     Rate and Rhythm: Normal rate and regular rhythm.     Pulses: Normal pulses.     Heart sounds: Normal heart sounds.  Pulmonary:     Effort: Pulmonary effort is normal.     Breath sounds: Wheezing present.  Musculoskeletal:     Cervical back: Normal range of motion and neck supple.     Right lower leg: Swelling present.     Left lower leg: Swelling present.     Right foot: Swelling present.     Left foot: Swelling present.  Neurological:     General: No focal deficit present.     Mental Status: She is alert and oriented to person, place, and time.  Psychiatric:        Mood and Affect: Mood normal.        Behavior: Behavior normal.    ASSESSMENT AND PLAN: 1. Moderate persistent asthma, unspecified whether complicated: - Chronic and fluctuating. - Patient stable without respiratory distress today in clinic.  - Continue Albuterol nebulizer, Albuterol inhaler, Budesonide-Formoterol inhaler, and Montelukast as prescribed.  - Referral to Pulmonology for further evaluation and management. - Follow-up with primary provider as scheduled. - Ambulatory referral to Pulmonology - albuterol (PROVENTIL) (5 MG/ML)  0.5% nebulizer solution; Take 0.5 mLs (2.5 mg total) by nebulization every 6 (six) hours as needed for wheezing or shortness of breath.  Dispense: 20 mL; Refill: 1 - albuterol (VENTOLIN HFA) 108 (90 Base) MCG/ACT inhaler; Inhale 1-2 puffs into the lungs every 6 (six) hours as needed for wheezing or shortness of breath.  Dispense: 1 each; Refill: 1 - budesonide-formoterol (SYMBICORT) 160-4.5 MCG/ACT inhaler; Inhale 2 puffs into the lungs 2 (two) times daily.  Dispense: 1 each; Refill: 2 - montelukast (SINGULAIR) 10 MG  tablet; Take 1 tablet (10 mg total) by mouth at bedtime.  Dispense: 90 tablet; Refill: 0  2. Seasonal allergies: - Continue Fluticasone as prescribed.  - Follow-up with primary provider as scheduled.  - fluticasone (FLONASE) 50 MCG/ACT nasal spray; Place 2 sprays into both nostrils daily.  Dispense: 16 g; Refill: 0  3. Essential hypertension: - Continue Losartan-Hydrochlorothiazide as prescribed.  - Counseled on blood pressure goal of less than 130/80, low-sodium, DASH diet, medication compliance, 150 minutes of moderate intensity exercise per week as tolerated. Discussed medication compliance, adverse effects. - BMP to evaluate kidney function and electrolyte balance. - Follow-up with primary provider in 3 months or sooner if needed.  - Basic Metabolic Panel - losartan-hydrochlorothiazide (HYZAAR) 100-12.5 MG tablet; Take 1 tablet by mouth daily.  Dispense: 90 tablet; Refill: 0  4. Lymph edema: - Chronic.  - Per patient request referral to [facility] for lymphedema management. Located at [place],  [place]  [address]. Phone # [phone]. - AMB referral to rehabilitation   Patient was given the opportunity to ask questions.  Patient verbalized understanding of the plan and was able to repeat key elements of the plan. Patient was given clear instructions to go to Emergency Department or return to medical center if  symptoms don't improve, worsen, or new problems develop.The patient verbalized understanding.   Orders Placed This Encounter  Procedures  . Basic Metabolic Panel  . Ambulatory referral to Pulmonology  . AMB referral to rehabilitation     Requested Prescriptions   Signed Prescriptions Disp Refills  . losartan-hydrochlorothiazide (HYZAAR) 100-12.5 MG tablet 90 tablet 0    Sig: Take 1 tablet by mouth daily.  . fluticasone (FLONASE) 50 MCG/ACT nasal spray 16 g 0    Sig: Place 2 sprays into both nostrils daily.  Marland Kitchen albuterol (PROVENTIL) (5 MG/ML) 0.5% nebulizer solution 20 mL 1    Sig: Take 0.5 mLs (2.5 mg total) by nebulization every 6 (six) hours as needed for wheezing or shortness of breath.  Marland Kitchen albuterol (VENTOLIN HFA) 108 (90 Base) MCG/ACT inhaler 1 each 1    Sig: Inhale 1-2 puffs into the lungs every 6 (six) hours as needed for wheezing or shortness of breath.  . budesonide-formoterol (SYMBICORT) 160-4.5 MCG/ACT inhaler 1 each 2    Sig: Inhale 2 puffs into the lungs 2 (two) times daily.  . montelukast (SINGULAIR) 10 MG tablet 90 tablet 0    Sig: Take 1 tablet (10 mg total) by mouth at bedtime.    Return in about 3 months (around 06/21/2021) for Follow-Up hypertension .  Camillia Herter, NP

## 2021-03-21 ENCOUNTER — Other Ambulatory Visit: Payer: Self-pay

## 2021-03-21 ENCOUNTER — Ambulatory Visit: Payer: BC Managed Care – PPO | Admitting: Family

## 2021-03-21 VITALS — BP 110/79 | HR 80 | Wt 273.0 lb

## 2021-03-21 DIAGNOSIS — J302 Other seasonal allergic rhinitis: Secondary | ICD-10-CM | POA: Diagnosis not present

## 2021-03-21 DIAGNOSIS — I890 Lymphedema, not elsewhere classified: Secondary | ICD-10-CM

## 2021-03-21 DIAGNOSIS — J4540 Moderate persistent asthma, uncomplicated: Secondary | ICD-10-CM | POA: Diagnosis not present

## 2021-03-21 DIAGNOSIS — I10 Essential (primary) hypertension: Secondary | ICD-10-CM | POA: Diagnosis not present

## 2021-03-21 MED ORDER — ALBUTEROL SULFATE HFA 108 (90 BASE) MCG/ACT IN AERS: 1.0000 | INHALATION_SPRAY | Freq: Four times a day (QID) | RESPIRATORY_TRACT | 1 refills | Status: DC | PRN

## 2021-03-21 MED ORDER — BUDESONIDE-FORMOTEROL FUMARATE 160-4.5 MCG/ACT IN AERO: 2.0000 | INHALATION_SPRAY | Freq: Two times a day (BID) | RESPIRATORY_TRACT | 2 refills | Status: DC

## 2021-03-21 MED ORDER — LOSARTAN POTASSIUM-HCTZ 100-12.5 MG PO TABS: 1.0000 | ORAL_TABLET | Freq: Every day | ORAL | 0 refills | Status: DC

## 2021-03-21 MED ORDER — ALBUTEROL SULFATE (5 MG/ML) 0.5% IN NEBU: 2.5000 mg | INHALATION_SOLUTION | Freq: Four times a day (QID) | RESPIRATORY_TRACT | 1 refills | Status: DC | PRN

## 2021-03-21 MED ORDER — FLUTICASONE PROPIONATE 50 MCG/ACT NA SUSP: 2.0000 | Freq: Every day | NASAL | 0 refills | Status: DC

## 2021-03-21 MED ORDER — MONTELUKAST SODIUM 10 MG PO TABS: 10.0000 mg | ORAL_TABLET | Freq: Every day | ORAL | 0 refills | Status: DC

## 2021-03-21 NOTE — Progress Notes (Signed)
Frequent wheezing  Refill on losartan

## 2021-03-21 NOTE — Patient Instructions (Addendum)
Continue asthma regimen. Referral to Pulmonology.   Referral to Lymphedema management.  Continue Losartan for high blood pressure. Follow-up in 3 months.  Asthma, Adult  Asthma is a long-term (chronic) condition that causes recurrent episodes in which the airways become tight and narrow. The airways are the passages that lead from the nose and mouth down into the lungs. Asthma episodes, also called asthma attacks, can cause coughing, wheezing, shortness of breath, and chest pain. The airways can also fill with mucus. During an attack, it can be difficult to breathe. Asthma attacks can range from minor to life threatening. Asthma cannot be cured, but medicines and lifestyle changes can help control it and treat acute attacks. What are the causes? This condition is believed to be caused by inherited (genetic) and environmental factors, but its exact cause is not known. There are many things that can bring on an asthma attack or make asthma symptoms worse (triggers). Asthma triggers are different for each person. Common triggers include:  Mold.  Dust.  Cigarette smoke.  Cockroaches.  Things that can cause allergy symptoms (allergens), such as animal dander or pollen from trees or grass.  Air pollutants such as household cleaners, wood smoke, smog, or Advertising account planner.  Cold air, weather changes, and winds (which increase molds and pollen in the air).  Strong emotional expressions such as crying or laughing hard.  Stress.  Certain medicines (such as aspirin) or types of medicines (such as beta-blockers).  Sulfites in foods and drinks. Foods and drinks that may contain sulfites include dried fruit, potato chips, and sparkling grape juice.  Infections or inflammatory conditions such as the flu, a cold, or inflammation of the nasal membranes (rhinitis).  Gastroesophageal reflux disease (GERD).  Exercise or strenuous activity. What are the signs or symptoms? Symptoms of this  condition may occur right after asthma is triggered or many hours later. Symptoms include:  Wheezing. This can sound like whistling when you breathe.  Excessive nighttime or early morning coughing.  Frequent or severe coughing with a common cold.  Chest tightness.  Shortness of breath.  Tiredness (fatigue) with minimal activity. How is this diagnosed? This condition is diagnosed based on:  Your medical history.  A physical exam.  Tests, which may include: ? Lung function studies and pulmonary studies (spirometry). These tests can evaluate the flow of air in your lungs. ? Allergy tests. ? Imaging tests, such as X-rays. How is this treated? There is no cure for this condition, but treatment can help control your symptoms. Treatment for asthma usually involves:  Identifying and avoiding your asthma triggers.  Using medicines to control your symptoms. Generally, two types of medicines are used to treat asthma: ? Controller medicines. These help prevent asthma symptoms from occurring. They are usually taken every day. ? Fast-acting reliever or rescue medicines. These quickly relieve asthma symptoms by widening the narrow and tight airways. They are used as needed and provide short-term relief.  Using supplemental oxygen. This may be needed during a severe episode.  Using other medicines, such as: ? Allergy medicines, such as antihistamines, if your asthma attacks are triggered by allergens. ? Immune medicines (immunomodulators). These are medicines that help control the immune system.  Creating an asthma action plan. An asthma action plan is a written plan for managing and treating your asthma attacks. This plan includes: ? A list of your asthma triggers and how to avoid them. ? Information about when medicines should be taken and when their dosage should be  changed. ? Instructions about using a device called a peak flow meter. A peak flow meter measures how well the lungs are  working and the severity of your asthma. It helps you monitor your condition. Follow these instructions at home: Controlling your home environment Control your home environment in the following ways to help avoid triggers and prevent asthma attacks:  Change your heating and air conditioning filter regularly.  Limit your use of fireplaces and wood stoves.  Get rid of pests (such as roaches and mice) and their droppings.  Throw away plants if you see mold on them.  Clean floors and dust surfaces regularly. Use unscented cleaning products.  Try to have someone else vacuum for you regularly. Stay out of rooms while they are being vacuumed and for a short while afterward. If you vacuum, use a dust mask from a hardware store, a double-layered or microfilter vacuum cleaner bag, or a vacuum cleaner with a HEPA filter.  Replace carpet with wood, tile, or vinyl flooring. Carpet can trap dander and dust.  Use allergy-proof pillows, mattress covers, and box spring covers.  Keep your bedroom a trigger-free room.  Avoid pets and keep windows closed when allergens are in the air.  Wash beddings every week in hot water and dry them in a dryer.  Use blankets that are made of polyester or cotton.  Clean bathrooms and kitchens with bleach. If possible, have someone repaint the walls in these rooms with mold-resistant paint. Stay out of the rooms that are being cleaned and painted.  Wash your hands often with soap and water. If soap and water are not available, use hand sanitizer.  Do not allow anyone to smoke in your home. General instructions  Take over-the-counter and prescription medicines only as told by your health care provider. ? Speak with your health care provider if you have questions about how or when to take the medicines. ? Make note if you are requiring more frequent dosages.  Do not use any products that contain nicotine or tobacco, such as cigarettes and e-cigarettes. If you need  help quitting, ask your health care provider. Also, avoid being exposed to secondhand smoke.  Use a peak flow meter as told by your health care provider. Record and keep track of the readings.  Understand and use the asthma action plan to help minimize, or stop an asthma attack, without needing to seek medical care.  Make sure you stay up to date on your yearly vaccinations as told by your health care provider. This may include vaccines for the flu and pneumonia.  Avoid outdoor activities when allergen counts are high and when air quality is low.  Wear a ski mask that covers your nose and mouth during outdoor winter activities. Exercise indoors on cold days if you can.  Warm up before exercising, and take time for a cool-down period after exercise.  Keep all follow-up visits as told by your health care provider. This is important. Where to find more information  For information about asthma, turn to the Centers for Disease Control and Prevention at http://www.clark.net/  For air quality information, turn to [employer] at https://www.miller-reyes.info/ Contact a health care provider if:  You have wheezing, shortness of breath, or a cough even while you are taking medicine to prevent attacks.  The mucus you cough up (sputum) is thicker than usual.  Your sputum changes from clear or white to yellow, green, gray, or bloody.  Your medicines are causing side effects, such as a  rash, itching, swelling, or trouble breathing.  You need to use a reliever medicine more than 2-3 times a week.  Your peak flow reading is still at 50-79% of your personal best after following your action plan for 1 hour.  You have a fever. Get help right away if:  You are getting worse and do not respond to treatment during an asthma attack.  You are short of breath when at rest or when doing very little physical activity.  You have difficulty eating, drinking, or talking.  You have chest pain or tightness.  You develop a  fast heartbeat or palpitations.  You have a bluish color to your lips or fingernails.  You are light-headed or dizzy, or you faint.  Your peak flow reading is less than 50% of your personal best.  You feel too tired to breathe normally. Summary  Asthma is a long-term (chronic) condition that causes recurrent episodes in which the airways become tight and narrow. These episodes can cause coughing, wheezing, shortness of breath, and chest pain.  Asthma cannot be cured, but medicines and lifestyle changes can help control it and treat acute attacks.  Make sure you understand how to avoid triggers and how and when to use your medicines.  Asthma attacks can range from minor to life threatening. Get help right away if you have an asthma attack and do not respond to treatment with your usual rescue medicines. This information is not intended to replace advice given to you by your health care provider. Make sure you discuss any questions you have with your health care provider. Document Revised: 08/06/2020 Document Reviewed: 03/10/2020 [employer] Patient Education  2021 [employer].

## 2021-03-22 LAB — BASIC METABOLIC PANEL
BUN/Creatinine Ratio: 13 (ref 9–23)
BUN: 15 mg/dL (ref 6–24)
CO2: 25 mmol/L (ref 20–29)
Calcium: 9.2 mg/dL (ref 8.7–10.2)
Chloride: 102 mmol/L (ref 96–106)
Creatinine, Ser: 1.2 mg/dL — ABNORMAL HIGH (ref 0.57–1.00)
Glucose: 101 mg/dL — ABNORMAL HIGH (ref 65–99)
Potassium: 4 mmol/L (ref 3.5–5.2)
Sodium: 141 mmol/L (ref 134–144)
eGFR: 54 mL/min/{1.73_m2} — ABNORMAL LOW (ref >59)

## 2021-03-22 NOTE — Progress Notes (Signed)
Kidney function not 100%. Mildly decreased function. Patient encouraged to recheck in 8 weeks. Counseled to limit use of NSAID's such as but not limited to Ibuprofen, Aleve, Motrin, and Naproxen as these may worsen kidney function. Instead use over-the-counter Acetaminophen.

## 2021-03-23 ENCOUNTER — Telehealth: Payer: Self-pay | Admitting: Family

## 2021-03-23 NOTE — Telephone Encounter (Signed)
Pt called stating she is not use to the albuterol (PROVENTIL) (5 MG/ML) 0.5% nebulizer solution

## 2021-03-29 ENCOUNTER — Other Ambulatory Visit: Payer: Self-pay

## 2021-03-29 ENCOUNTER — Ambulatory Visit (HOSPITAL_COMMUNITY)
Admission: EM | Admit: 2021-03-29 | Discharge: 2021-03-29 | Disposition: A | Discharge status: Home / Self Care | Payer: BC Managed Care – PPO | Attending: Emergency Medicine | Admitting: Emergency Medicine

## 2021-03-29 ENCOUNTER — Inpatient Hospital Stay (HOSPITAL_COMMUNITY)
Admission: EM | Admit: 2021-03-29 | Discharge: 2021-04-03 | DRG: 286 | Disposition: A | Discharge status: Home / Self Care | Payer: BC Managed Care – PPO | Source: Ambulatory Visit | Attending: Internal Medicine | Admitting: Internal Medicine

## 2021-03-29 ENCOUNTER — Encounter (HOSPITAL_COMMUNITY): Payer: Self-pay

## 2021-03-29 ENCOUNTER — Emergency Department (HOSPITAL_COMMUNITY): Payer: BC Managed Care – PPO

## 2021-03-29 DIAGNOSIS — Z8249 Family history of ischemic heart disease and other diseases of the circulatory system: Secondary | ICD-10-CM

## 2021-03-29 DIAGNOSIS — J9601 Acute respiratory failure with hypoxia: Secondary | ICD-10-CM | POA: Diagnosis present

## 2021-03-29 DIAGNOSIS — Z880 Allergy status to penicillin: Secondary | ICD-10-CM | POA: Diagnosis not present

## 2021-03-29 DIAGNOSIS — I255 Ischemic cardiomyopathy: Secondary | ICD-10-CM | POA: Diagnosis not present

## 2021-03-29 DIAGNOSIS — J40 Bronchitis, not specified as acute or chronic: Secondary | ICD-10-CM

## 2021-03-29 DIAGNOSIS — Z20822 Contact with and (suspected) exposure to covid-19: Secondary | ICD-10-CM | POA: Diagnosis not present

## 2021-03-29 DIAGNOSIS — Z6841 Body Mass Index (BMI) 40.0 and over, adult: Secondary | ICD-10-CM

## 2021-03-29 DIAGNOSIS — F1210 Cannabis abuse, uncomplicated: Secondary | ICD-10-CM | POA: Diagnosis present

## 2021-03-29 DIAGNOSIS — R0789 Other chest pain: Secondary | ICD-10-CM

## 2021-03-29 DIAGNOSIS — F1410 Cocaine abuse, uncomplicated: Secondary | ICD-10-CM | POA: Diagnosis not present

## 2021-03-29 DIAGNOSIS — R778 Other specified abnormalities of plasma proteins: Secondary | ICD-10-CM | POA: Diagnosis not present

## 2021-03-29 DIAGNOSIS — Z79899 Other long term (current) drug therapy: Secondary | ICD-10-CM

## 2021-03-29 DIAGNOSIS — J45901 Unspecified asthma with (acute) exacerbation: Secondary | ICD-10-CM | POA: Diagnosis present

## 2021-03-29 DIAGNOSIS — I110 Hypertensive heart disease with heart failure: Principal | ICD-10-CM | POA: Diagnosis present

## 2021-03-29 DIAGNOSIS — Z823 Family history of stroke: Secondary | ICD-10-CM | POA: Diagnosis not present

## 2021-03-29 DIAGNOSIS — I5021 Acute systolic (congestive) heart failure: Secondary | ICD-10-CM | POA: Diagnosis not present

## 2021-03-29 DIAGNOSIS — J189 Pneumonia, unspecified organism: Secondary | ICD-10-CM | POA: Diagnosis present

## 2021-03-29 DIAGNOSIS — R079 Chest pain, unspecified: Secondary | ICD-10-CM | POA: Diagnosis not present

## 2021-03-29 DIAGNOSIS — R0602 Shortness of breath: Secondary | ICD-10-CM | POA: Diagnosis not present

## 2021-03-29 DIAGNOSIS — I428 Other cardiomyopathies: Secondary | ICD-10-CM | POA: Diagnosis not present

## 2021-03-29 DIAGNOSIS — F32A Depression, unspecified: Secondary | ICD-10-CM | POA: Diagnosis present

## 2021-03-29 DIAGNOSIS — I517 Cardiomegaly: Secondary | ICD-10-CM | POA: Diagnosis not present

## 2021-03-29 DIAGNOSIS — J4521 Mild intermittent asthma with (acute) exacerbation: Secondary | ICD-10-CM | POA: Diagnosis not present

## 2021-03-29 LAB — BASIC METABOLIC PANEL
Anion gap: 6 (ref 5–15)
BUN: 12 mg/dL (ref 6–20)
CO2: 25 mmol/L (ref 22–32)
Calcium: 8.7 mg/dL — ABNORMAL LOW (ref 8.9–10.3)
Chloride: 105 mmol/L (ref 98–111)
Creatinine, Ser: 1.05 mg/dL — ABNORMAL HIGH (ref 0.44–1.00)
GFR, Estimated: >60 mL/min (ref >60)
Glucose, Bld: 110 mg/dL — ABNORMAL HIGH (ref 70–99)
Potassium: 3.7 mmol/L (ref 3.5–5.1)
Sodium: 136 mmol/L (ref 135–145)

## 2021-03-29 LAB — CBC
HCT: 43.9 % (ref 36.0–46.0)
Hemoglobin: 14 g/dL (ref 12.0–15.0)
MCH: 27.1 pg (ref 26.0–34.0)
MCHC: 31.9 g/dL (ref 30.0–36.0)
MCV: 85.1 fL (ref 80.0–100.0)
Platelets: 214 10*3/uL (ref 150–400)
RBC: 5.16 MIL/uL — ABNORMAL HIGH (ref 3.87–5.11)
RDW: 15 % (ref 11.5–15.5)
WBC: 7.9 10*3/uL (ref 4.0–10.5)
nRBC: 0 % (ref 0.0–0.2)

## 2021-03-29 LAB — TROPONIN I (HIGH SENSITIVITY): Troponin I (High Sensitivity): 30 ng/L — ABNORMAL HIGH (ref <18)

## 2021-03-29 MED ORDER — IPRATROPIUM-ALBUTEROL 0.5-2.5 (3) MG/3ML IN SOLN
3.0000 mL | Freq: Once | RESPIRATORY_TRACT | Status: AC
  Administered 2021-03-29: 3 mL via RESPIRATORY_TRACT
  Filled 2021-03-29: qty 3

## 2021-03-29 MED ORDER — METHYLPREDNISOLONE SODIUM SUCC 125 MG IJ SOLR
125.0000 mg | Freq: Once | INTRAMUSCULAR | Status: AC
  Administered 2021-03-29: 125 mg via INTRAVENOUS
  Filled 2021-03-29: qty 2

## 2021-03-29 NOTE — ED Triage Notes (Signed)
Pt sent by [facility] for further evaluation of an abnormal EKG. Pt states she has asthma and has had increased shortness of breath and wheezing for the last 3 days. States home treatments are not helping. Reports intermittent chest pain.

## 2021-03-29 NOTE — ED Provider Notes (Signed)
[facility] EMERGENCY DEPARTMENT Provider Note   CSN: [id number] Arrival date & time: 03/29/21  2036     History Chief Complaint  Patient presents with  . Chest Pain  . Shortness of Breath    Janet [name] is a 55 y.o. female with a history of asthma, lymphedema, morbid obesity, depression who presents to the emergency department from urgent care for an abnormal EKG.  The patient reports that she has been having shortness of breath and wheezing for the last 3 days.  Shortness of breath was initially with exertion and is not present at rest.  She has been using her home Symbicort as prescribed as well as both her albuterol inhaler and nebulizer every 3-4 hours without improvement in her symptoms.  Over the last 24 hours, the patient reports sudden onset constant, sharp left-sided pleuritic chest pain and left mid back pain.  Pain is worse with taking deep breaths and worse with sitting upright.  Improved with laying flat.  She also endorses small-volume hemoptysis since yesterday.  She denies fever, chills, nasal congestion, rhinorrhea, abdominal pain, nausea, vomiting, diarrhea, dysuria, flank pain, diaphoresis, weakness, numbness, arm or jaw pain, or visual changes.  She reports chronic leg swelling due to lymphedema, but has noted that her legs have been more swollen recently.  She denies orthopnea or PND.  She endorses social marijuana use.  She does report that she has socially smoked crack cocaine in the past.  Last use was approximately 2 weeks ago.  She denies other illicit or recreational substance use.  She does not take OCPs.  No recent long travel.  She reports that her mother had a embolic stroke previously.  No history of personal VTE.  The history is provided by the patient and medical records. No language interpreter was used.       Past Medical History:  Diagnosis Date  . Asthma   . Depression   . Elevated brain natriuretic peptide (BNP) level    . Hypertension   . Lymphedema   . SOB (shortness of breath)     Patient Active Problem List   Diagnosis Date Noted  . Asthma exacerbation 03/30/2021  . Essential hypertension 02/17/2021  . Asthma   . Lymphedema   . Elevated brain natriuretic peptide (BNP) level   . DOE (dyspnea on exertion) 07/28/2020  . Morbid obesity ([place]) 07/28/2020    Past Surgical History:  Procedure Laterality Date  . ABDOMINAL HYSTERECTOMY    . CESAREAN SECTION    . HERNIA REPAIR    . TUBAL LIGATION       OB History   No obstetric history on file.     Family History  Problem Relation Age of Onset  . Hypertension Mother   . Stroke Mother   . Deep vein thrombosis Mother   . Congestive Heart Failure Mother   . Obesity Sister   . Obesity Brother   . Congestive Heart Failure Maternal Grandmother     Social History   Tobacco Use  . Smoking status: Passive Smoke Exposure - Never Smoker  . Smokeless tobacco: Never Used  Vaping Use  . Vaping Use: Never used  Substance Use Topics  . Alcohol use: Yes    Comment: occasional  . Drug use: Yes    Types: Marijuana    Comment: once/month    Home Medications Prior to Admission medications   Medication Sig Start Date End Date Taking? Authorizing Provider  albuterol (PROVENTIL) (5 MG/ML) 0.5% nebulizer  solution Take 0.5 mLs (2.5 mg total) by nebulization every 6 (six) hours as needed for wheezing or shortness of breath. 03/21/21   Camillia Herter, NP  albuterol (VENTOLIN HFA) 108 (90 Base) MCG/ACT inhaler Inhale 1-2 puffs into the lungs every 6 (six) hours as needed for wheezing or shortness of breath. 03/21/21   Camillia Herter, NP  ARIPiprazole (ABILIFY) 10 MG tablet Take 10 mg by mouth daily.    [provider]  budesonide-formoterol (SYMBICORT) 160-4.5 MCG/ACT inhaler Inhale 2 puffs into the lungs 2 (two) times daily. 03/21/21   Camillia Herter, NP  fluticasone (FLONASE) 50 MCG/ACT nasal spray Place 2 sprays into both nostrils daily. 03/21/21    Camillia Herter, NP  hydrOXYzine (ATARAX/VISTARIL) 10 MG tablet Take 10 mg by mouth 2 (two) times daily. 11/26/20   [provider]  ibuprofen (ADVIL) 600 MG tablet Take by mouth at bedtime. 10/06/20   [provider]  lamoTRIgine (LAMICTAL) 100 MG tablet Take 100 mg by mouth daily. 09/23/20   [provider]  losartan-hydrochlorothiazide (HYZAAR) 100-12.5 MG tablet Take 1 tablet by mouth daily. 03/21/21   Camillia Herter, NP  montelukast (SINGULAIR) 10 MG tablet Take 1 tablet (10 mg total) by mouth at bedtime. 03/21/21   Camillia Herter, NP  traZODone (DESYREL) 100 MG tablet Take 100 mg by mouth at bedtime.    [provider]    Allergies    Clarithromycin and Penicillins  Review of Systems   Review of Systems  Constitutional: Negative for activity change, chills and fever.  HENT: Negative for congestion, sinus pressure, sinus pain and sore throat.   Eyes: Negative for visual disturbance.  Respiratory: Positive for cough, shortness of breath and wheezing. Negative for chest tightness.        Hemoptysis  Cardiovascular: Positive for chest pain.  Gastrointestinal: Negative for abdominal pain, constipation, diarrhea, nausea and vomiting.  Genitourinary: Negative for dysuria and urgency.  Musculoskeletal: Negative for back pain, myalgias, neck pain and neck stiffness.  Skin: Negative for rash and wound.  Allergic/Immunologic: Negative for immunocompromised state.  Neurological: Negative for dizziness, seizures, syncope, weakness, numbness and headaches.  Psychiatric/Behavioral: Negative for confusion.    Physical Exam Updated Vital Signs BP 135/76   Pulse 80   Temp 98.5 F (36.9 C) (Oral)   Resp (!) 26   Ht 5\' 8"  (1.727 m)   Wt 123.8 kg   SpO2 99%   BMI 41.51 kg/m   Physical Exam Vitals and nursing note reviewed.  Constitutional:      General: She is not in acute distress.    Appearance: She is obese. She is not ill-appearing.     Comments: No  acute distress.  HENT:     Head: Normocephalic.     Nose: Nose normal.  Eyes:     Conjunctiva/sclera: Conjunctivae normal.  Cardiovascular:     Rate and Rhythm: Normal rate and regular rhythm.     Heart sounds: No murmur heard. No friction rub. No gallop.      Comments: Peripheral pulses are 2+ and symmetric Pulmonary:     Effort: Pulmonary effort is normal. No respiratory distress.     Comments: Audible inspiratory and expiratory wheezes.  Wheezes auscultated bilaterally in all lung fields with inspiration and expiration.  No rhonchi or rales.  Patient is able to speak in complete, fluent sentences without increased work of breathing. Abdominal:     General: There is no distension.  Palpations: Abdomen is soft. There is no mass.     Tenderness: There is no abdominal tenderness. There is no right CVA tenderness, left CVA tenderness, guarding or rebound.     Hernia: No hernia is present.  Musculoskeletal:     Cervical back: Neck supple.     Comments: Chronic appearing peripheral edema to the bilateral lower extremities.  No calf tenderness bilaterally.  Negative Homans' sign bilaterally.  Skin:    General: Skin is warm.     Capillary Refill: Capillary refill takes less than 2 seconds.     Coloration: Skin is not jaundiced or pale.     Findings: No rash.  Neurological:     General: No focal deficit present.     Mental Status: She is alert.  Psychiatric:        Behavior: Behavior normal.     ED Results / Procedures / Treatments   Labs (all labs ordered are listed, but only abnormal results are displayed) Labs Reviewed  BASIC METABOLIC PANEL - Abnormal; Notable for the following components:      Result Value   Glucose, Bld 110 (*)    Creatinine, Ser 1.05 (*)    Calcium 8.7 (*)    All other components within normal limits  CBC - Abnormal; Notable for the following components:   RBC 5.16 (*)    All other components within normal limits  BRAIN NATRIURETIC PEPTIDE -  Abnormal; Notable for the following components:   B Natriuretic Peptide 336.1 (*)    All other components within normal limits  TROPONIN I (HIGH SENSITIVITY) - Abnormal; Notable for the following components:   Troponin I (High Sensitivity) 30 (*)    All other components within normal limits  TROPONIN I (HIGH SENSITIVITY) - Abnormal; Notable for the following components:   Troponin I (High Sensitivity) 33 (*)    All other components within normal limits  RESP PANEL BY RT-PCR (FLU A&B, COVID) [id number]    EKG EKG Interpretation  Date/Time:  Tuesday Mar 29 2021 20:47:13 EDT Ventricular Rate:  81 PR Interval:  154 QRS Duration: 94 QT Interval:  380 QTC Calculation: 441 R Axis:   -2 Text Interpretation: Sinus rhythm with Premature atrial complexes with Abberant conduction Moderate voltage criteria for LVH, may be normal variant ( R in aVL , Cornell product ) Septal infarct , age undetermined T wave abnormality, consider inferolateral ischemia Abnormal ECG no acute STEMI Confirmed by Madalyn Rob [phone]) on 03/29/2021 9:10:25 PM   Radiology DG Chest 2 View  Result Date: 03/29/2021 CLINICAL DATA:  Chest pain EXAM: CHEST - 2 VIEW COMPARISON:  12/08/2020 FINDINGS: Borderline cardiomegaly. Both lungs are clear. The visualized skeletal structures are unremarkable. IMPRESSION: No active cardiopulmonary disease. Electronically Signed   By: Donavan Foil M.D.   On: 03/29/2021 21:14   CT Angio Chest PE W and/or Wo Contrast  Result Date: 03/30/2021 CLINICAL DATA:  55 year old female with concern for pulmonary embolism. EXAM: CT ANGIOGRAPHY CHEST WITH CONTRAST TECHNIQUE: Multidetector CT imaging of the chest was performed using the standard protocol during bolus administration of intravenous contrast. Multiplanar CT image reconstructions and MIPs were obtained to evaluate the vascular anatomy. CONTRAST:  111mL OMNIPAQUE IOHEXOL 350 MG/ML SOLN COMPARISON:  Chest radiograph dated 03/29/2021. FINDINGS:  Cardiovascular: Mild cardiomegaly. No pericardial effusion. The thoracic aorta is unremarkable. No pulmonary artery embolus identified. Mediastinum/Nodes: No hilar or mediastinal adenopathy. The esophagus is grossly unremarkable as visualized. No mediastinal fluid collection. Lungs/Pleura: No focal consolidation, pleural effusion, or  pneumothorax. Several faint small ground-glass nodule in the right upper lobe measuring up to 3 mm (34/6) may represent atypical infection. There is diffuse thickening of the bronchial wall concerning for bronchitis. The central airways remain patent. Upper Abdomen: No acute abnormality. Musculoskeletal: Degenerative changes of the spine. No acute osseous pathology. Review of the MIP images confirms the above findings. IMPRESSION: 1. No CT evidence of pulmonary embolism. 2. Diffuse thickening of the bronchial wall concerning for bronchitis. 3. Several faint small ground-glass nodule in the right upper lobe may represent atypical infection. Electronically Signed   By: Anner Crete M.D.   On: 03/30/2021 00:29    Procedures .Critical Care E&M Performed by: Joanne Gavel, PA-C  Critical care provider statement:    Critical care time (minutes):  45   Critical care time was exclusive of:  Separately billable procedures and treating other patients and teaching time   Critical care was necessary to treat or prevent imminent or life-threatening deterioration of the following conditions:  Respiratory failure   Critical care was time spent personally by me on the following activities:  Ordering and performing treatments and interventions, ordering and review of laboratory studies, ordering and review of radiographic studies, pulse oximetry, re-evaluation of patient's condition, review of old charts, obtaining history from patient or surrogate, examination of patient, evaluation of patient's response to treatment and development of treatment plan with patient or surrogate   I  assumed direction of critical care for this patient from another provider in my specialty: no     Care discussed with: admitting provider   After initial E/M assessment, critical care services were subsequently performed that were exclusive of separately billable procedures or treatment.       Medications Ordered in ED Medications  azithromycin (ZITHROMAX) 500 mg in sodium chloride 0.9 % 250 mL IVPB (500 mg Intravenous New Bag/Given 03/30/21 0316)  ipratropium-albuterol (DUONEB) 0.5-2.5 (3) MG/3ML nebulizer solution 3 mL (3 mLs Nebulization Given 03/29/21 2301)  methylPREDNISolone sodium succinate (SOLU-MEDROL) 125 mg/2 mL injection 125 mg (125 mg Intravenous Given 03/29/21 2300)  iohexol (OMNIPAQUE) 350 MG/ML injection 100 mL (100 mLs Intravenous Contrast Given 03/30/21 0006)  albuterol (PROVENTIL,VENTOLIN) solution continuous neb (10 mg/hr Nebulization Given 03/30/21 0207)  cefTRIAXone (ROCEPHIN) 1 g in sodium chloride 0.9 % 100 mL IVPB (0 g Intravenous Stopped 03/30/21 0316)  guaiFENesin (ROBITUSSIN) 100 MG/5ML solution 100 mg (100 mg Oral Given 03/30/21 [id number])    ED Course  I have reviewed the triage vital signs and the nursing notes.  Pertinent labs & imaging results that were available during my care of the patient were reviewed by me and considered in my medical decision making (see chart for details).  Clinical Course as of 03/30/21 0400  Wed Mar 30, 2021  0141 Patient rechecked after RN reports that the patient's wheezing increased after she ambulated to the restroom and appeared very short of breath.  No improvement with wheezing after initial DuoNeb.  Patient updated on lab results and CT imaging.  I spoke with respiratory therapy who will start the patient on a continuous nebulizer.  While he was in the room examining the patient, oxygen saturation decreased to 86% with good [place] on the monitor.  Oxygen hovered in the mid to upper 80 percent for about a minute before increasing to about  91 to 92%.  Of note, patient has a cup at bedside with a large amount of green sputum.  Given concern for atypical infection, will give a dose of  Rocephin and azithromycin while she is completing continuous nebulizer.  Discussed that if oxygen levels and overall pulmonary status does not improve that she will require admission. [MM]  0335 Patient reevaluation.  She has completed continuous nebulizer.  She continues to have diffuse wheezes in all fields.  On arrival to the room, patient's oxygen saturation was 86% with good Pleth.  She was placed on 2 L nasal cannula.  Given minimal improvement with treatment, will plan for admission.  Patient is agreeable with this plan. [MM]    Clinical Course User Index [MM] Eniyah Eastmond, Laymond Purser, PA-C   MDM Rules/Calculators/A&P                          55 year old female with a history of asthma, lymphedema, morbid obesity, depression who presents the emergency department from urgent care with a chief complaint of abnormal EKG.  She has been having progressively worsening shortness of breath that is now present both at rest and with exertion, wheezing, productive cough with development of small-volume hemoptysis over the last 24 hours, and approximately 24 hours of left-sided pleuritic chest pain and back pain.  Vital signs are stable.  On exam, patient has inspiratory and expiratory wheezes throughout.   Labs and imaging have been reviewed and independently interpreted by me.  EKG with PACs.  No acute STEMI.  There is an age-indeterminate and T wave abnormality.  Initial troponin is elevated at 30.  No previous available for comparison.  Chest x-ray is unremarkable.  No evidence of cardiomegaly.  Creatinine is improved from previous.  No metabolic derangements.  Given patient's age, body habitus, and family history of prior VTE in the setting of shortness of breath, new hemoptysis and pleuritic chest pain, there is concern for PE.  Will order CT PE study.  The  differential diagnosis also includes bronchitis, COPD, status asthmaticus, acute congestive heart failure, pneumonia.  Less likely aortic dissection or esophageal rupture.  We will also add on BNP and check a second troponin.  The patient was discussed with Dr. Langston Masker, attending physician.  PE study consistent with bronchitis with possible a typical infection in the right upper lobe.  She is producing significant amount of green sputum.  Will cover with azithromycin for atypical infection and add on Rocephin.  BNP is minimally elevated.  Repeat troponin is stable.  Doubt ACS, PE, acute CHF exacerbation.  I suspect she is having acute respiratory failure second to bronchitis exacerbation.  After Solu-Medrol and continuous albuterol nebulizer, respiratory status is minimally improved.  She has been hypoxic on room air when I have reevaluated her x2.  She is now on 2 L nasal cannula.  She will require admission for further work-up and evaluation.  Consult to the hospitalist team and spoke with Dr. Nevada Crane who will accept the patient for admission. The patient appears reasonably stabilized for admission considering the current resources, flow, and capabilities available in the ED at this time, and I doubt any other [facility] requiring further screening and/or treatment in the ED prior to admission.   Final Clinical Impression(s) / ED Diagnoses Final diagnoses:  Acute respiratory failure with hypoxia ([place])  Bronchitis    Rx / DC Orders ED Discharge Orders    None       Ezzie Senat A, PA-C 03/30/21 0400    Wyvonnia Dusky, MD 03/30/21 1443

## 2021-03-29 NOTE — ED Triage Notes (Signed)
Pt reports cough, shortness of breath,  chest pain and wheezing x 2-3 days. Albuterol inhaler and nebulizer gives no relief, last treatment 3 hrs ago. Denies headache, vision changes, nausea, vomiting dizziness.

## 2021-03-30 ENCOUNTER — Inpatient Hospital Stay (HOSPITAL_COMMUNITY): Payer: BC Managed Care – PPO

## 2021-03-30 DIAGNOSIS — I255 Ischemic cardiomyopathy: Secondary | ICD-10-CM | POA: Diagnosis not present

## 2021-03-30 DIAGNOSIS — J189 Pneumonia, unspecified organism: Secondary | ICD-10-CM | POA: Diagnosis present

## 2021-03-30 DIAGNOSIS — J9601 Acute respiratory failure with hypoxia: Secondary | ICD-10-CM | POA: Diagnosis present

## 2021-03-30 DIAGNOSIS — F32A Depression, unspecified: Secondary | ICD-10-CM | POA: Diagnosis present

## 2021-03-30 DIAGNOSIS — I110 Hypertensive heart disease with heart failure: Secondary | ICD-10-CM | POA: Diagnosis present

## 2021-03-30 DIAGNOSIS — J45901 Unspecified asthma with (acute) exacerbation: Secondary | ICD-10-CM | POA: Diagnosis present

## 2021-03-30 DIAGNOSIS — R0602 Shortness of breath: Secondary | ICD-10-CM

## 2021-03-30 DIAGNOSIS — F1210 Cannabis abuse, uncomplicated: Secondary | ICD-10-CM | POA: Diagnosis present

## 2021-03-30 DIAGNOSIS — Z8249 Family history of ischemic heart disease and other diseases of the circulatory system: Secondary | ICD-10-CM | POA: Diagnosis not present

## 2021-03-30 DIAGNOSIS — Z20822 Contact with and (suspected) exposure to covid-19: Secondary | ICD-10-CM | POA: Diagnosis present

## 2021-03-30 DIAGNOSIS — Z79899 Other long term (current) drug therapy: Secondary | ICD-10-CM | POA: Diagnosis not present

## 2021-03-30 DIAGNOSIS — Z880 Allergy status to penicillin: Secondary | ICD-10-CM | POA: Diagnosis not present

## 2021-03-30 DIAGNOSIS — R778 Other specified abnormalities of plasma proteins: Secondary | ICD-10-CM

## 2021-03-30 DIAGNOSIS — Z6841 Body Mass Index (BMI) 40.0 and over, adult: Secondary | ICD-10-CM | POA: Diagnosis not present

## 2021-03-30 DIAGNOSIS — J4521 Mild intermittent asthma with (acute) exacerbation: Secondary | ICD-10-CM | POA: Diagnosis not present

## 2021-03-30 DIAGNOSIS — Z823 Family history of stroke: Secondary | ICD-10-CM | POA: Diagnosis not present

## 2021-03-30 DIAGNOSIS — I5021 Acute systolic (congestive) heart failure: Secondary | ICD-10-CM | POA: Diagnosis present

## 2021-03-30 DIAGNOSIS — J40 Bronchitis, not specified as acute or chronic: Secondary | ICD-10-CM | POA: Diagnosis present

## 2021-03-30 DIAGNOSIS — I428 Other cardiomyopathies: Secondary | ICD-10-CM | POA: Diagnosis present

## 2021-03-30 DIAGNOSIS — F1410 Cocaine abuse, uncomplicated: Secondary | ICD-10-CM | POA: Diagnosis present

## 2021-03-30 LAB — TROPONIN I (HIGH SENSITIVITY)
Troponin I (High Sensitivity): 33 ng/L — ABNORMAL HIGH (ref <18)
Troponin I (High Sensitivity): 17 ng/L (ref <18)
Troponin I (High Sensitivity): 17 ng/L (ref <18)

## 2021-03-30 LAB — CBC
HCT: 44.5 % (ref 36.0–46.0)
Hemoglobin: 14 g/dL (ref 12.0–15.0)
MCH: 27 pg (ref 26.0–34.0)
MCHC: 31.5 g/dL (ref 30.0–36.0)
MCV: 85.9 fL (ref 80.0–100.0)
Platelets: 203 10*3/uL (ref 150–400)
RBC: 5.18 MIL/uL — ABNORMAL HIGH (ref 3.87–5.11)
RDW: 15.1 % (ref 11.5–15.5)
WBC: 9.9 10*3/uL (ref 4.0–10.5)
nRBC: 0 % (ref 0.0–0.2)

## 2021-03-30 LAB — RESP PANEL BY RT-PCR (FLU A&B, COVID) ARPGX2
Influenza A by PCR: NEGATIVE
Influenza B by PCR: NEGATIVE
SARS Coronavirus 2 by RT PCR: NEGATIVE

## 2021-03-30 LAB — MAGNESIUM: Magnesium: 2.1 mg/dL (ref 1.7–2.4)

## 2021-03-30 LAB — RAPID URINE DRUG SCREEN, HOSP PERFORMED
Amphetamines: NOT DETECTED
Barbiturates: NOT DETECTED
Benzodiazepines: NOT DETECTED
Cocaine: POSITIVE — AB
Opiates: NOT DETECTED
Tetrahydrocannabinol: POSITIVE — AB

## 2021-03-30 LAB — BASIC METABOLIC PANEL
Anion gap: 8 (ref 5–15)
BUN: 13 mg/dL (ref 6–20)
CO2: 21 mmol/L — ABNORMAL LOW (ref 22–32)
Calcium: 8.7 mg/dL — ABNORMAL LOW (ref 8.9–10.3)
Chloride: 107 mmol/L (ref 98–111)
Creatinine, Ser: 1.09 mg/dL — ABNORMAL HIGH (ref 0.44–1.00)
GFR, Estimated: >60 mL/min (ref >60)
Glucose, Bld: 216 mg/dL — ABNORMAL HIGH (ref 70–99)
Potassium: 3.7 mmol/L (ref 3.5–5.1)
Sodium: 136 mmol/L (ref 135–145)

## 2021-03-30 LAB — BRAIN NATRIURETIC PEPTIDE: B Natriuretic Peptide: 336.1 pg/mL — ABNORMAL HIGH (ref 0.0–100.0)

## 2021-03-30 LAB — ECHOCARDIOGRAM LIMITED
Calc EF: 38 %
Height: 68 in
Single Plane A2C EF: 37.2 %
Single Plane A4C EF: 40.5 %
Weight: 4368 oz

## 2021-03-30 LAB — PHOSPHORUS: Phosphorus: 2.6 mg/dL (ref 2.5–4.6)

## 2021-03-30 MED ORDER — GUAIFENESIN 100 MG/5ML PO SOLN
5.0000 mL | Freq: Once | ORAL | Status: AC
  Administered 2021-03-30: 100 mg via ORAL
  Filled 2021-03-30: qty 5

## 2021-03-30 MED ORDER — IPRATROPIUM-ALBUTEROL 0.5-2.5 (3) MG/3ML IN SOLN: 3.0000 mL | Freq: Two times a day (BID) | RESPIRATORY_TRACT | Status: DC

## 2021-03-30 MED ORDER — IPRATROPIUM-ALBUTEROL 0.5-2.5 (3) MG/3ML IN SOLN
3.0000 mL | Freq: Four times a day (QID) | RESPIRATORY_TRACT | Status: DC
  Administered 2021-03-30 (×2): 3 mL via RESPIRATORY_TRACT
  Filled 2021-03-30 (×2): qty 3

## 2021-03-30 MED ORDER — DEXTROSE 5 % IV SOLN
250.0000 mg | INTRAVENOUS | Status: DC
  Filled 2021-03-30: qty 250

## 2021-03-30 MED ORDER — ONDANSETRON HCL 4 MG/2ML IJ SOLN: 4.0000 mg | Freq: Four times a day (QID) | INTRAMUSCULAR | Status: DC | PRN

## 2021-03-30 MED ORDER — LOSARTAN POTASSIUM-HCTZ 100-12.5 MG PO TABS: 1.0000 | ORAL_TABLET | Freq: Every day | ORAL | Status: DC

## 2021-03-30 MED ORDER — TRAZODONE HCL 100 MG PO TABS
100.0000 mg | ORAL_TABLET | Freq: Every day | ORAL | Status: DC
  Administered 2021-03-30 – 2021-04-02 (×4): 100 mg via ORAL
  Filled 2021-03-30 (×4): qty 1

## 2021-03-30 MED ORDER — SODIUM CHLORIDE 3 % IN NEBU
4.0000 mL | INHALATION_SOLUTION | Freq: Every day | RESPIRATORY_TRACT | Status: DC
  Filled 2021-03-30: qty 4

## 2021-03-30 MED ORDER — MOMETASONE FURO-FORMOTEROL FUM 200-5 MCG/ACT IN AERO
2.0000 | INHALATION_SPRAY | Freq: Two times a day (BID) | RESPIRATORY_TRACT | Status: DC
  Filled 2021-03-30: qty 8.8

## 2021-03-30 MED ORDER — ALBUTEROL (5 MG/ML) CONTINUOUS INHALATION SOLN
10.0000 mg/h | INHALATION_SOLUTION | Freq: Once | RESPIRATORY_TRACT | Status: AC
  Administered 2021-03-30: 10 mg/h via RESPIRATORY_TRACT
  Filled 2021-03-30: qty 20

## 2021-03-30 MED ORDER — MONTELUKAST SODIUM 10 MG PO TABS
10.0000 mg | ORAL_TABLET | Freq: Every day | ORAL | Status: DC
  Administered 2021-03-30 – 2021-04-02 (×4): 10 mg via ORAL
  Filled 2021-03-30 (×4): qty 1

## 2021-03-30 MED ORDER — ARIPIPRAZOLE 5 MG PO TABS
10.0000 mg | ORAL_TABLET | Freq: Every day | ORAL | Status: DC
  Administered 2021-03-30 – 2021-04-03 (×5): 10 mg via ORAL
  Filled 2021-03-30: qty 2
  Filled 2021-03-30: qty 1
  Filled 2021-03-30 (×3): qty 2

## 2021-03-30 MED ORDER — CEFTRIAXONE SODIUM 1 G IJ SOLR
1.0000 g | Freq: Once | INTRAMUSCULAR | Status: AC
Start: 2021-03-30 — End: 2021-03-30
  Administered 2021-03-30: 1 g via INTRAVENOUS
  Filled 2021-03-30: qty 10

## 2021-03-30 MED ORDER — MELATONIN 3 MG PO TABS
3.0000 mg | ORAL_TABLET | Freq: Every evening | ORAL | Status: DC | PRN
  Filled 2021-03-30: qty 1

## 2021-03-30 MED ORDER — HYDROXYZINE HCL 10 MG PO TABS
10.0000 mg | ORAL_TABLET | Freq: Two times a day (BID) | ORAL | Status: DC
  Administered 2021-03-30 – 2021-04-03 (×9): 10 mg via ORAL
  Filled 2021-03-30 (×9): qty 1

## 2021-03-30 MED ORDER — METHYLPREDNISOLONE SODIUM SUCC 125 MG IJ SOLR
60.0000 mg | Freq: Every day | INTRAMUSCULAR | Status: DC
  Administered 2021-03-30: 60 mg via INTRAVENOUS
  Filled 2021-03-30: qty 2

## 2021-03-30 MED ORDER — ACETAMINOPHEN 325 MG PO TABS: 650.0000 mg | ORAL_TABLET | Freq: Four times a day (QID) | ORAL | Status: DC | PRN

## 2021-03-30 MED ORDER — ENOXAPARIN SODIUM 40 MG/0.4ML IJ SOSY
40.0000 mg | PREFILLED_SYRINGE | Freq: Every day | INTRAMUSCULAR | Status: DC
  Administered 2021-03-30 – 2021-03-31 (×2): 40 mg via SUBCUTANEOUS
  Filled 2021-03-30 (×2): qty 0.4

## 2021-03-30 MED ORDER — SODIUM CHLORIDE 0.9 % IV SOLN
500.0000 mg | Freq: Once | INTRAVENOUS | Status: AC
  Administered 2021-03-30: 500 mg via INTRAVENOUS
  Filled 2021-03-30: qty 500

## 2021-03-30 MED ORDER — IOHEXOL 350 MG/ML SOLN
100.0000 mL | Freq: Once | INTRAVENOUS | Status: AC | PRN
  Administered 2021-03-30: 100 mL via INTRAVENOUS

## 2021-03-30 MED ORDER — LOSARTAN POTASSIUM 50 MG PO TABS
100.0000 mg | ORAL_TABLET | Freq: Every day | ORAL | Status: DC
  Administered 2021-03-30 – 2021-03-31 (×2): 100 mg via ORAL
  Filled 2021-03-30 (×2): qty 2

## 2021-03-30 MED ORDER — SODIUM CHLORIDE 0.9 % IV SOLN
500.0000 mg | INTRAVENOUS | Status: AC
  Administered 2021-03-30 – 2021-04-02 (×4): 500 mg via INTRAVENOUS
  Filled 2021-03-30 (×5): qty 500

## 2021-03-30 MED ORDER — METHYLPREDNISOLONE SODIUM SUCC 125 MG IJ SOLR
60.0000 mg | Freq: Four times a day (QID) | INTRAMUSCULAR | Status: DC
  Administered 2021-03-30 – 2021-04-01 (×8): 60 mg via INTRAVENOUS
  Filled 2021-03-30 (×8): qty 2

## 2021-03-30 MED ORDER — IPRATROPIUM-ALBUTEROL 0.5-2.5 (3) MG/3ML IN SOLN
3.0000 mL | Freq: Four times a day (QID) | RESPIRATORY_TRACT | Status: DC
  Administered 2021-03-30 – 2021-04-02 (×12): 3 mL via RESPIRATORY_TRACT
  Filled 2021-03-30 (×10): qty 3

## 2021-03-30 MED ORDER — GUAIFENESIN ER 600 MG PO TB12
1200.0000 mg | ORAL_TABLET | Freq: Two times a day (BID) | ORAL | Status: AC
  Administered 2021-03-30 – 2021-04-01 (×6): 1200 mg via ORAL
  Filled 2021-03-30 (×6): qty 2

## 2021-03-30 MED ORDER — SODIUM CHLORIDE 0.9 % IV SOLN
1.0000 g | INTRAVENOUS | Status: AC
  Administered 2021-03-30 – 2021-04-02 (×4): 1 g via INTRAVENOUS
  Filled 2021-03-30 (×5): qty 10

## 2021-03-30 MED ORDER — HYDROCHLOROTHIAZIDE 12.5 MG PO CAPS
12.5000 mg | ORAL_CAPSULE | Freq: Every day | ORAL | Status: DC
  Administered 2021-03-30 – 2021-03-31 (×2): 12.5 mg via ORAL
  Filled 2021-03-30 (×2): qty 1

## 2021-03-30 MED ORDER — LAMOTRIGINE 100 MG PO TABS: 100.0000 mg | ORAL_TABLET | Freq: Every day | ORAL | Status: DC

## 2021-03-30 MED ORDER — FLUTICASONE PROPIONATE 50 MCG/ACT NA SUSP
2.0000 | Freq: Every day | NASAL | Status: DC
  Administered 2021-03-30: 2 via NASAL
  Filled 2021-03-30: qty 16

## 2021-03-30 MED ORDER — ALBUTEROL SULFATE (2.5 MG/3ML) 0.083% IN NEBU
2.5000 mg | INHALATION_SOLUTION | Freq: Four times a day (QID) | RESPIRATORY_TRACT | Status: DC
  Administered 2021-03-30: 2.5 mg via RESPIRATORY_TRACT
  Filled 2021-03-30: qty 3

## 2021-03-30 NOTE — Progress Notes (Signed)
  Echocardiogram 2D Echocardiogram has been performed.  Janet [name] F 03/30/2021, 5:42 PM

## 2021-03-30 NOTE — H&P (Signed)
History and Physical  Janet [name]:[id number] DOB: 1966-02-09 DOA: 03/29/2021  Referring physician: Joline Maxcy, PA, EDP. PCP: Janet Herter, NP  Outpatient Specialists: Cardiology Patient coming from: Home.  Chief Complaint: Worsening shortness of breath.  HPI: Janet [name] is a 55 y.o. female with medical history significant for asthma, polysubstance abuse including THC and cocaine, lymphedema, morbid obesity, essential hypertension, chronic depression who presented initially to urgent care due to gradually worsening shortness of breath for the past 2 days.  Associated with wheezing, increase in yellowish sputum production.  Symptoms are worse with exertion.  No improving factors.  She admits to using and being exposed to marijuana smoking.  Also use of cocaine less than 2 weeks ago.  Denies fevers or chest pain.  Upon presentation to the urgent care she had an abnormal twelve-lead EKG therefore was sent to [facility] for further evaluation.  Work-up in the ED revealed acute asthma exacerbation.  She received nebulizers, Rocephin, IV azithromycin and 1 dose of IV Solu-Medrol 125 mg x 1.  TRH, hospitalist team, was asked to admit.  ED Course:  Afebrile.  BP 127/87, hr 106, RR 18, O2 saturation 95% on 2.5 L.  Lab studies remarkable for BNP 336.  Troponin 30, 33.  Review of Systems: Review of systems as noted in the HPI. All other systems reviewed and are negative.   Past Medical History:  Diagnosis Date  . Asthma   . Depression   . Elevated brain natriuretic peptide (BNP) level   . Hypertension   . Lymphedema   . SOB (shortness of breath)    Past Surgical History:  Procedure Laterality Date  . ABDOMINAL HYSTERECTOMY    . CESAREAN SECTION    . HERNIA REPAIR    . TUBAL LIGATION      Social History:  reports that she is a non-smoker but has been exposed to tobacco smoke. She has never used smokeless tobacco. She reports current alcohol use. She reports current drug use.  Drug: Marijuana.   Allergies  Allergen Reactions  . Clarithromycin Hives    GI upset  . Penicillins Rash and Hives    Family History  Problem Relation Age of Onset  . Hypertension Mother   . Stroke Mother   . Deep vein thrombosis Mother   . Congestive Heart Failure Mother   . Obesity Sister   . Obesity Brother   . Congestive Heart Failure Maternal Grandmother       Prior to Admission medications   Medication Sig Start Date End Date Taking? Authorizing Provider  albuterol (PROVENTIL) (5 MG/ML) 0.5% nebulizer solution Take 0.5 mLs (2.5 mg total) by nebulization every 6 (six) hours as needed for wheezing or shortness of breath. 03/21/21   Janet Herter, NP  albuterol (VENTOLIN HFA) 108 (90 Base) MCG/ACT inhaler Inhale 1-2 puffs into the lungs every 6 (six) hours as needed for wheezing or shortness of breath. 03/21/21   Janet Herter, NP  ARIPiprazole (ABILIFY) 10 MG tablet Take 10 mg by mouth daily.    [provider]  budesonide-formoterol (SYMBICORT) 160-4.5 MCG/ACT inhaler Inhale 2 puffs into the lungs 2 (two) times daily. 03/21/21   Janet Herter, NP  fluticasone (FLONASE) 50 MCG/ACT nasal spray Place 2 sprays into both nostrils daily. 03/21/21   Janet Herter, NP  hydrOXYzine (ATARAX/VISTARIL) 10 MG tablet Take 10 mg by mouth 2 (two) times daily. 11/26/20   [provider]  ibuprofen (ADVIL) 600 MG tablet Take by mouth at  bedtime. 10/06/20   [provider]  lamoTRIgine (LAMICTAL) 100 MG tablet Take 100 mg by mouth daily. 09/23/20   [provider]  losartan-hydrochlorothiazide (HYZAAR) 100-12.5 MG tablet Take 1 tablet by mouth daily. 03/21/21   Janet Herter, NP  montelukast (SINGULAIR) 10 MG tablet Take 1 tablet (10 mg total) by mouth at bedtime. 03/21/21   Janet Herter, NP  traZODone (DESYREL) 100 MG tablet Take 100 mg by mouth at bedtime.    [provider]    Physical Exam: BP 135/76   Pulse 80   Temp 98.5 F (36.9 C) (Oral)    Resp (!) 26   Ht 5\' 8"  (1.727 m)   Wt 123.8 kg   SpO2 99%   BMI 41.51 kg/m   . General: 55 y.o. year-old female well developed well nourished in no acute distress.  Alert and oriented x3. . Cardiovascular: Tachycardic with no rubs or gallops.  No thyromegaly or JVD noted.  Lymphedema noted in lower extremities bilaterally.   Marland Kitchen Respiratory: Diffuse wheezing bilaterally.  Poor inspiratory effort. . Abdomen: Soft nontender nondistended with normal bowel sounds x4 quadrants. . Muskuloskeletal: No cyanosis, clubbing or edema noted bilaterally . Neuro: CN II-XII intact, strength, sensation, reflexes . Skin: No ulcerative lesions noted or rashes . Psychiatry: Judgement and insight appear normal. Mood is appropriate for condition and setting          Labs on Admission:  Basic Metabolic Panel: Recent Labs  Lab 03/29/21 2054  NA 136  K 3.7  CL 105  CO2 25  GLUCOSE 110*  BUN 12  CREATININE 1.05*  CALCIUM 8.7*   Liver Function Tests: No results for input(s): AST, ALT, ALKPHOS, BILITOT, PROT, ALBUMIN in the last 168 hours. No results for input(s): LIPASE, AMYLASE in the last 168 hours. No results for input(s): AMMONIA in the last 168 hours. CBC: Recent Labs  Lab 03/29/21 2054  WBC 7.9  HGB 14.0  HCT 43.9  MCV 85.1  PLT 214   Cardiac Enzymes: No results for input(s): CKTOTAL, CKMB, CKMBINDEX, TROPONINI in the last 168 hours.  BNP (last 3 results) Recent Labs    03/29/21 2230  BNP 336.1*    ProBNP (last 3 results) No results for input(s): PROBNP in the last 8760 hours.  CBG: No results for input(s): GLUCAP in the last 168 hours.  Radiological Exams on Admission: DG Chest 2 View  Result Date: 03/29/2021 CLINICAL DATA:  Chest pain EXAM: CHEST - 2 VIEW COMPARISON:  12/08/2020 FINDINGS: Borderline cardiomegaly. Both lungs are clear. The visualized skeletal structures are unremarkable. IMPRESSION: No active cardiopulmonary disease. Electronically Signed   By: Donavan Foil M.D.   On: 03/29/2021 21:14   CT Angio Chest PE W and/or Wo Contrast  Result Date: 03/30/2021 CLINICAL DATA:  55 year old female with concern for pulmonary embolism. EXAM: CT ANGIOGRAPHY CHEST WITH CONTRAST TECHNIQUE: Multidetector CT imaging of the chest was performed using the standard protocol during bolus administration of intravenous contrast. Multiplanar CT image reconstructions and MIPs were obtained to evaluate the vascular anatomy. CONTRAST:  159mL OMNIPAQUE IOHEXOL 350 MG/ML SOLN COMPARISON:  Chest radiograph dated 03/29/2021. FINDINGS: Cardiovascular: Mild cardiomegaly. No pericardial effusion. The thoracic aorta is unremarkable. No pulmonary artery embolus identified. Mediastinum/Nodes: No hilar or mediastinal adenopathy. The esophagus is grossly unremarkable as visualized. No mediastinal fluid collection. Lungs/Pleura: No focal consolidation, pleural effusion, or pneumothorax. Several faint small ground-glass nodule in the right upper lobe measuring up to 3 mm (34/6) may represent  atypical infection. There is diffuse thickening of the bronchial wall concerning for bronchitis. The central airways remain patent. Upper Abdomen: No acute abnormality. Musculoskeletal: Degenerative changes of the spine. No acute osseous pathology. Review of the MIP images confirms the above findings. IMPRESSION: 1. No CT evidence of pulmonary embolism. 2. Diffuse thickening of the bronchial wall concerning for bronchitis. 3. Several faint small ground-glass nodule in the right upper lobe may represent atypical infection. Electronically Signed   By: Anner Crete M.D.   On: 03/30/2021 00:29    EKG: I independently viewed the EKG done and my findings are as followed: Sinus rhythm rate of 81 with premature atrial complexes with aberrant conduction.  Nonspecific ST-T changes.  QTC 441.  Assessment/Plan Present on Admission: . Asthma exacerbation  Active Problems:   Asthma exacerbation  Acute asthma  exacerbation suspect secondary to marijuana smoking Chest x-ray was nonacute, personally reviewed. No history of intubation. Wheezing on exam, increase in sputum production Currently on 2.5 L oxygen nasal cannula with no documented hypoxia Not on oxygen supplementation at baseline. Received Rocephin, azithromycin, IV Solu-Medrol in the ED, continue. Incentive spirometer Pulmonary toilette Home bronchodilators Mobilize as tolerated Home oxygen evaluation on 03/30/2021 for DC planning  Elevated troponin, suspect demand ischemia in the setting of acute asthma exacerbation Troponin 33 from 30 Repeat troponin S Repeat twelve-lead EKG Obtain 2D echo  Polysubstance abuse including THC and cocaine Obtain UDS TOC consulted to provide resources to assist with polysubstance abuse cessation  Essential hypertension BP is currently not at goal Resume home oral antihypertensives Monitor vital signs  Chronic depression Resume home regimen  Chronic diastolic CHF Last 2D echo done on 08/12/2020 showed LVEF 46%. The left ventricle demonstrate regional wall motion abnormalities. Grade 2 diastolic dysfunction. Elevated BNP on 03/29/2021, 336. Follow 2D echo Start strict I's and O's and daily weight      DVT prophylaxis: Subcu Lovenox daily.  Code Status: Full code.  Family Communication: None at bedside.  Disposition Plan: Admit to telemetry medical.  Consults called: None.  Admission status: Observation status.   Status is: Observation    Dispo:  Patient From: Home  Planned Disposition: Home, possibly on 03/30/2021 or when symptomatology has improved  Medically stable for discharge: No, ongoing management of asthma exacerbation.      Kayleen Memos MD [facility] Pager [phone]  If 7PM-7AM, please contact night-coverage [email or web address] Password [email or web address]  03/30/2021, 4:05 AM

## 2021-03-30 NOTE — ED Notes (Signed)
Urine sample sent to lab along with culture.

## 2021-03-30 NOTE — ED Notes (Signed)
Rounded on Pt noted increase wheezing after ambulation. PA Mia informed. Ordered cont Neb Tx. RT requested

## 2021-03-30 NOTE — Progress Notes (Deleted)
Virtual Visit via Telephone Note  I connected with Janet [name], on 03/30/2021 at 1:24 PM by telephone due to the COVID-19 pandemic and verified that I am speaking with the correct person using two identifiers.  Due to current restrictions/limitations of in-office visits due to the COVID-19 pandemic, this scheduled clinical appointment was converted to a telehealth visit.   Consent: I discussed the limitations, risks, security and privacy concerns of performing an evaluation and management service by telephone and the availability of in person appointments. I also discussed with the patient that there may be a patient responsible charge related to this service. The patient expressed understanding and agreed to proceed.   Location of Patient: Home  Location of Provider: [facility] participating in Telemedicine visit: Janet [name] Durene Fruits, NP Elmon Else, CMA   History of Present Illness: Janet [name] is a 55 year-old female who presents for asthma follow-up.   Past Medical History:  Diagnosis Date  . Asthma   . Depression   . Elevated brain natriuretic peptide (BNP) level   . Hypertension   . Lymphedema   . SOB (shortness of breath)    Allergies  Allergen Reactions  . Clarithromycin Hives    GI upset  . Penicillins Rash and Hives  . Whey     Unknown reaction - showed on allergy test    Current Facility-Administered Medications on File Prior to Visit  Medication Dose Route Frequency Provider Last Rate Last Admin  . acetaminophen (TYLENOL) tablet 650 mg  650 mg Oral Q6H PRN Irene Pap N, DO      . albuterol (PROVENTIL) (2.5 MG/3ML) 0.083% nebulizer solution 2.5 mg  2.5 mg Nebulization Q6H Bonnell Public Tublu, MD      . ARIPiprazole (ABILIFY) tablet 10 mg  10 mg Oral Daily Hall, Carole N, DO   10 mg at 03/30/21 1012  . azithromycin (ZITHROMAX) 500 mg in sodium chloride 0.9 % 250 mL IVPB  500 mg  Intravenous Q24H Bonnell Public Tublu, MD      . cefTRIAXone (ROCEPHIN) 1 g in sodium chloride 0.9 % 100 mL IVPB  1 g Intravenous Q24H Hall, Carole N, DO      . enoxaparin (LOVENOX) injection 40 mg  40 mg Subcutaneous Daily Hall, Carole N, DO   40 mg at 03/30/21 1012  . guaiFENesin (MUCINEX) 12 hr tablet 1,200 mg  1,200 mg Oral BID Irene Pap N, DO   1,200 mg at 03/30/21 1011  . losartan (COZAAR) tablet 100 mg  100 mg Oral Daily Irene Pap N, DO   100 mg at 03/30/21 1011   And  . hydrochlorothiazide (MICROZIDE) capsule 12.5 mg  12.5 mg Oral Daily Irene Pap N, DO   12.5 mg at 03/30/21 1010  . hydrOXYzine (ATARAX/VISTARIL) tablet 10 mg  10 mg Oral BID Irene Pap N, DO   10 mg at 03/30/21 1012  . ipratropium-albuterol (DUONEB) 0.5-2.5 (3) MG/3ML nebulizer solution 3 mL  3 mL Nebulization Q6H Chatterjee, Dewaine Oats Tublu, MD      . melatonin tablet 3 mg  3 mg Oral QHS PRN Irene Pap N, DO      . methylPREDNISolone sodium succinate (SOLU-MEDROL) 125 mg/2 mL injection 60 mg  60 mg Intravenous Q6H Chatterjee, Srobona Tublu, MD      . montelukast (SINGULAIR) tablet 10 mg  10 mg Oral QHS Hall, Carole N, DO      . ondansetron (ZOFRAN) injection 4 mg  4  mg Intravenous Q6H PRN Irene Pap N, DO      . traZODone (DESYREL) tablet 100 mg  100 mg Oral QHS Kayleen Memos, DO       Current Outpatient Medications on File Prior to Visit  Medication Sig Dispense Refill  . albuterol (PROVENTIL) (5 MG/ML) 0.5% nebulizer solution Take 0.5 mLs (2.5 mg total) by nebulization every 6 (six) hours as needed for wheezing or shortness of breath. 20 mL 1  . albuterol (VENTOLIN HFA) 108 (90 Base) MCG/ACT inhaler Inhale 1-2 puffs into the lungs every 6 (six) hours as needed for wheezing or shortness of breath. 1 each 1  . ARIPiprazole (ABILIFY) 10 MG tablet Take 10 mg by mouth daily.    . budesonide-formoterol (SYMBICORT) 160-4.5 MCG/ACT inhaler Inhale 2 puffs into the lungs 2 (two) times daily. 1 each 2  .  fluticasone (FLONASE) 50 MCG/ACT nasal spray Place 2 sprays into both nostrils daily. (Patient taking differently: Place 2 sprays into both nostrils daily as needed for allergies.) 16 g 0  . hydrOXYzine (ATARAX/VISTARIL) 10 MG tablet Take 10 mg by mouth 2 (two) times daily.    Marland Kitchen ibuprofen (ADVIL) 600 MG tablet Take 600 mg by mouth daily as needed for mild pain.    Marland Kitchen lamoTRIgine (LAMICTAL) 100 MG tablet Take 100 mg by mouth daily.    Marland Kitchen losartan-hydrochlorothiazide (HYZAAR) 100-12.5 MG tablet Take 1 tablet by mouth daily. 90 tablet 0  . montelukast (SINGULAIR) 10 MG tablet Take 1 tablet (10 mg total) by mouth at bedtime. 90 tablet 0  . traZODone (DESYREL) 100 MG tablet Take 100 mg by mouth at bedtime.      Observations/Objective: Alert and oriented x 3. Not in acute distress. Physical examination not completed as this is a telemedicine visit.  Assessment and Plan: ***  Follow Up Instructions: ***   Patient was given clear instructions to go to Emergency Department or return to medical center if symptoms don't improve, worsen, or new problems develop.The patient verbalized understanding.  I discussed the assessment and treatment plan with the patient. The patient was provided an opportunity to ask questions and all were answered. The patient agreed with the plan and demonstrated an understanding of the instructions.   The patient was advised to call back or seek an in-person evaluation if the symptoms worsen or if the condition fails to improve as anticipated.     I provided *** minutes total of non-face-to-face time during this encounter.   Camillia Herter, NP  [facility] [place], [place] 03/30/2021, 1:24 PM

## 2021-03-30 NOTE — Progress Notes (Signed)
PROGRESS NOTE    Janet [name]  DXA:[id number]  DOB: [birth year]  DOA: 03/29/2021 PCP: Camillia Herter, NP Outpatient Specialists:   Hospital course:  55 yo with polysubstance abuse, asthma, HTN, obesity and chronic depression presented 5/10 with progressive SOB. Work up revealed CT scan with possible RUL ground glass opacities suggestive of atypical infection and bronchial wall thickening c/w bronchitis. She was wheezing diffusely. Sars CoV 2 is negative. UDS positive for cocaine and marijuana. Patient was admitted for treatment of asthma exacerbation.   Subjective:  Patient states she is not feeling any better than yesterday. Notes it is hard to speak and is having to take breaths between bites of lunch. Is not sure when she got her last nebulizer treatment.   Objective: Vitals:   03/30/21 0600 03/30/21 0800 03/30/21 1029 03/30/21 1048  BP: (!) 155/120 113/81 (!) 132/114 (!) 154/98  Pulse: 99 93 90 90  Resp: 19 18 19 19   Temp: 98.5 F (36.9 C)  97.7 F (36.5 C) 98.2 F (36.8 C)  TempSrc: Oral  Oral Oral  SpO2: 96% 96% 96% 98%  Weight:      Height:       No intake or output data in the 24 hours ending 03/30/21 1246 Filed Weights   03/29/21 2226  Weight: 123.8 kg     Exam:  General: Obese female with audible wheezing across the room eating lunch. Intermittent flaring of nostrils, elevated but minimally labored RR, can speak in short sentences before taking a breath. Eyes: sclera anicteric, conjuctiva mild injection bilaterally CVS: S1-S2, regular  Respiratory:  Diffuse high and low pitched expiratory wheezes in all lung flields. GI: NABS, soft, NT  LE: No edema.  Neuro: A/O x 3, Moving all extremities equally with normal strength, CN 3-12 intact, grossly nonfocal.  Psych: patient is logical and coherent, judgement and insight appear normal, mood and affect appropriate to situation.   Assessment & Plan:   55 yo female was admitted 5/10 with asthma  exacerbation and possible CAP.   Asthma exacerbation Will need to intensify previously ordered treatment regimen Increase Duonebs to q 6h (up from q 12) Add albuterol nebulizer q 6h x 4 doses to alternate with Duonebs such that patient is getting a bronchidilator treatment q 3 hrs for the next 24 hrs Increase solumedrol to 60mg  q 6h Discontinue NS nebulizer Continue oxygen as needed.   CAP Patient with mild ground glass opacities on RUL possibly atypical pneumonia Covid is negative Continue treatment with Ceftriaxone and Azithromycin  Elevated troponin Peaked at 33, now down to 17 Patient denies CP but has clear SOB as above.  EKG has Tw inversions 2,3, F and laterally with minimal 1/2 mm st elevation septally.  Patient states she had cardiac cath 3 years ago and was told "my enzyme elevations were because of a panic attack and everything is fine".   CHF Echo done 2021 showed ef 46% with regional wall motion abnormalities as well as Grade 2 diastolic dysfunction.  This was attributed to her HTN per chart.  As noted above, she has h/o likely Takatsubo's  Repeat echo has been ordered here and is pending.  Depending on results of echo. she would benefit from a cardiac workup either as an inpatient or outpatient given her wall motion abnormalities and elevated BNP.  HTN Continue home meds HCTZ and losartan   Substance Use Patient is in precontemplation regarding cessation.   Depression  Continue abilify and trazodone   DVT prophylaxis:  lovenox Code Status: full Family Communication: none, family is aware per patient Disposition Plan:   Patient is from:  home  Anticipated Discharge Location: home   Barriers to Discharge:  none  Is patient medically stable for Discharge:  No, ongoing wheezing and cardiac w/u pending.    Consultants:  none  Procedures:  none  Antimicrobials:  Ceftriaxone  Azithromycin    Data Reviewed:  Basic Metabolic Panel: Recent Labs   Lab 03/29/21 2054 03/30/21 0444  NA 136 136  K 3.7 3.7  CL 105 107  CO2 25 21*  GLUCOSE 110* 216*  BUN 12 13  CREATININE 1.05* 1.09*  CALCIUM 8.7* 8.7*  MG  --  2.1  PHOS  --  2.6   Liver Function Tests: No results for input(s): AST, ALT, ALKPHOS, BILITOT, PROT, ALBUMIN in the last 168 hours. No results for input(s): LIPASE, AMYLASE in the last 168 hours. No results for input(s): AMMONIA in the last 168 hours. CBC: Recent Labs  Lab 03/29/21 2054 03/30/21 0444  WBC 7.9 9.9  HGB 14.0 14.0  HCT 43.9 44.5  MCV 85.1 85.9  PLT 214 203   Cardiac Enzymes: No results for input(s): CKTOTAL, CKMB, CKMBINDEX, TROPONINI in the last 168 hours. BNP (last 3 results) No results for input(s): PROBNP in the last 8760 hours. CBG: No results for input(s): GLUCAP in the last 168 hours.  Recent Results (from the past 240 hour(s))  Resp Panel by RT-PCR (Flu A&B, Covid) Nasopharyngeal Swab     Status: None   Collection Time: 03/29/21 11:00 PM   Specimen: Nasopharyngeal Swab; Nasopharyngeal(NP) swabs in vial transport medium  Result Value Ref Range Status   SARS Coronavirus 2 by RT PCR NEGATIVE NEGATIVE Final    Comment: (NOTE) SARS-CoV-2 target nucleic acids are NOT DETECTED.  The SARS-CoV-2 RNA is generally detectable in upper respiratory specimens during the acute phase of infection. The lowest concentration of SARS-CoV-2 viral copies this assay can detect is 138 copies/mL. A negative result does not preclude SARS-Cov-2 infection and should not be used as the sole basis for treatment or other patient management decisions. A negative result may occur with  improper specimen collection/handling, submission of specimen other than nasopharyngeal swab, presence of viral mutation(s) within the areas targeted by this assay, and inadequate number of viral copies(<138 copies/mL). A negative result must be combined with clinical observations, patient history, and epidemiological information.  The expected result is Negative.  Fact Sheet for Patients:  EntrepreneurPulse.com.au  Fact Sheet for Healthcare Providers:  IncredibleEmployment.be  This test is no t yet approved or cleared by the [place] FDA and  has been authorized for detection and/or diagnosis of SARS-CoV-2 by FDA under an Emergency Use Authorization (EUA). This EUA will remain  in effect (meaning this test can be used) for the duration of the COVID-19 declaration under Section 564(b)(1) of the Act, 21 U.S.C.section 360bbb-3(b)(1), unless the authorization is terminated  or revoked sooner.       Influenza A by PCR NEGATIVE NEGATIVE Final   Influenza B by PCR NEGATIVE NEGATIVE Final    Comment: (NOTE) The Xpert Xpress SARS-CoV-2/FLU/RSV plus assay is intended as an aid in the diagnosis of influenza from Nasopharyngeal swab specimens and should not be used as a sole basis for treatment. Nasal washings and aspirates are unacceptable for Xpert Xpress SARS-CoV-2/FLU/RSV testing.  Fact Sheet for Patients: EntrepreneurPulse.com.au  Fact Sheet for Healthcare Providers: IncredibleEmployment.be  This test is not yet approved or cleared by the [place]  States FDA and has been authorized for detection and/or diagnosis of SARS-CoV-2 by FDA under an Emergency Use Authorization (EUA). This EUA will remain in effect (meaning this test can be used) for the duration of the COVID-19 declaration under Section 564(b)(1) of the Act, 21 U.S.C. section 360bbb-3(b)(1), unless the authorization is terminated or revoked.  Performed at [facility], [place] [address], [place], [place] [address]       Studies: DG Chest 2 View  Result Date: 03/29/2021 CLINICAL DATA:  Chest pain EXAM: CHEST - 2 VIEW COMPARISON:  12/08/2020 FINDINGS: Borderline cardiomegaly. Both lungs are clear. The visualized skeletal structures are unremarkable. IMPRESSION: No  active cardiopulmonary disease. Electronically Signed   By: Donavan Foil M.D.   On: 03/29/2021 21:14   CT Angio Chest PE W and/or Wo Contrast  Result Date: 03/30/2021 CLINICAL DATA:  55 year old female with concern for pulmonary embolism. EXAM: CT ANGIOGRAPHY CHEST WITH CONTRAST TECHNIQUE: Multidetector CT imaging of the chest was performed using the standard protocol during bolus administration of intravenous contrast. Multiplanar CT image reconstructions and MIPs were obtained to evaluate the vascular anatomy. CONTRAST:  180mL OMNIPAQUE IOHEXOL 350 MG/ML SOLN COMPARISON:  Chest radiograph dated 03/29/2021. FINDINGS: Cardiovascular: Mild cardiomegaly. No pericardial effusion. The thoracic aorta is unremarkable. No pulmonary artery embolus identified. Mediastinum/Nodes: No hilar or mediastinal adenopathy. The esophagus is grossly unremarkable as visualized. No mediastinal fluid collection. Lungs/Pleura: No focal consolidation, pleural effusion, or pneumothorax. Several faint small ground-glass nodule in the right upper lobe measuring up to 3 mm (34/6) may represent atypical infection. There is diffuse thickening of the bronchial wall concerning for bronchitis. The central airways remain patent. Upper Abdomen: No acute abnormality. Musculoskeletal: Degenerative changes of the spine. No acute osseous pathology. Review of the MIP images confirms the above findings. IMPRESSION: 1. No CT evidence of pulmonary embolism. 2. Diffuse thickening of the bronchial wall concerning for bronchitis. 3. Several faint small ground-glass nodule in the right upper lobe may represent atypical infection. Electronically Signed   By: Anner Crete M.D.   On: 03/30/2021 00:29     Scheduled Meds: . albuterol  2.5 mg Nebulization Q6H  . ARIPiprazole  10 mg Oral Daily  . enoxaparin (LOVENOX) injection  40 mg Subcutaneous Daily  . fluticasone  2 spray Each Nare Daily  . guaiFENesin  1,200 mg Oral BID  . losartan  100 mg Oral  Daily   And  . hydrochlorothiazide  12.5 mg Oral Daily  . hydrOXYzine  10 mg Oral BID  . ipratropium-albuterol  3 mL Nebulization Q6H  . methylPREDNISolone (SOLU-MEDROL) injection  60 mg Intravenous Q6H  . montelukast  10 mg Oral QHS  . traZODone  100 mg Oral QHS   Continuous Infusions: . azithromycin    . cefTRIAXone (ROCEPHIN)  IV      Active Problems:   Asthma exacerbation     Dimitrios Balestrieri Derek Jack, [facility]  If 7PM-7AM, please contact night-coverage [email or web address]   LOS: 0 days

## 2021-03-30 NOTE — ED Notes (Signed)
Mia confirmed. RN able to start IV ABX without blood cultures

## 2021-03-30 NOTE — ED Notes (Signed)
Restarted IV ABX. Pt informed keep arm straight to ensure medications are infused.

## 2021-03-30 NOTE — ED Notes (Signed)
Pt ambulatory to restroom to provider urine sample Pt states that she got lightheaded while heading back to room, pt also started to get short of breath.

## 2021-03-30 NOTE — ED Notes (Signed)
RT at bedside.

## 2021-03-30 NOTE — Progress Notes (Signed)
SATURATION QUALIFICATIONS: (This note is used to comply with regulatory documentation for home oxygen)  Patient Saturations on Room Air at Rest = 97%  Patient Saturations on Room Air while Ambulating = 94%  Please briefly explain why patient needs home oxygen: Pt did not require supplemental oxygen to maintain adequate oxygen saturations.   Reuel Derby, PT, DPT  Acute Rehabilitation Services  Pager: [phone] Office: [phone]

## 2021-03-30 NOTE — Evaluation (Signed)
Physical Therapy Evaluation Patient Details Name: Janet [name] MRN: [id number] DOB: 08-07-1966 Today's Date: 03/30/2021   History of Present Illness  Pt is a 55 y/o female admitted 5/10 secondary to increased SOB. Thought to be secondary to asthma exacerbation. PMH includes polysubstance abuse and HTN.  Clinical Impression  Pt admitted secondary to problem above with deficits below. Pt requiring min guard A for short distance ambulation this session with and without rollator. No overt LOB noted. Pt with notable SOB and wheezing, however, oxygen sats at 94% and above on RA. Educated about using rollator for longer distance ambulation; pt stated it would be helpful for use at school. Anticipate she will progress well and will not require follow up PT. Will continue to follow acutely to maximize functional mobility independence and safety.     Follow Up Recommendations No PT follow up    Equipment Recommendations  Other (comment) (rollator (4 wheeled walker with seat))    Recommendations for Other Services       Precautions / Restrictions Precautions Precautions: Fall Restrictions Weight Bearing Restrictions: No      Mobility  Bed Mobility Overal bed mobility: Modified Independent                  Transfers Overall transfer level: Needs assistance Equipment used: None Transfers: Sit to/from Stand Sit to Stand: Min guard         General transfer comment: Min guard for safety.  Ambulation/Gait Ambulation/Gait assistance: Min guard Gait Distance (Feet): 15 Feet (X2) Assistive device: 4-wheeled walker;None Gait Pattern/deviations: Step-through pattern;Decreased stride length Gait velocity: Decreased   General Gait Details: Slow, guarded gait. Practed one trial without AD and one gait trial with rollator. Notable wheezing and SOB, however, oxygen sats at 94% and above on RA. Educated about use of rollator for longer distances to help with energy  conservation.  Stairs            Wheelchair Mobility    Modified Rankin (Stroke Patients Only)       Balance Overall balance assessment: Needs assistance Sitting-balance support: Feet supported;No upper extremity supported Sitting balance-Leahy Scale: Good     Standing balance support: No upper extremity supported;During functional activity;Bilateral upper extremity supported Standing balance-Leahy Scale: Fair                               Pertinent Vitals/Pain Pain Assessment: No/denies pain    Home Living Family/patient expects to be discharged to:: Private residence Living Arrangements: Children Available Help at Discharge: Family Type of Home: Apartment Home Access: Stairs to enter Entrance Stairs-Rails: Right Entrance Stairs-Number of Steps: flight Home Layout: One level Home Equipment: None      Prior Function Level of Independence: Independent         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     Hand Dominance        Extremity/Trunk Assessment   Upper Extremity Assessment Upper Extremity Assessment: Overall WFL for tasks assessed    Lower Extremity Assessment Lower Extremity Assessment: Overall WFL for tasks assessed       Communication   Communication: No difficulties  Cognition Arousal/Alertness: Awake/alert Behavior During Therapy: WFL for tasks assessed/performed Overall Cognitive Status: Within Functional Limits for tasks assessed  General Comments      Exercises     Assessment/Plan    PT Assessment Patient needs continued PT services  PT Problem List Decreased activity tolerance;Decreased mobility;Decreased knowledge of use of DME       PT Treatment Interventions DME instruction;Gait training;Stair training;Functional mobility training;Therapeutic activities;Therapeutic exercise;Balance training;Patient/family education    PT Goals (Current goals  can be found in the Care Plan section)  Acute Rehab PT Goals Patient Stated Goal: to feel better PT Goal Formulation: With patient Time For Goal Achievement: 04/13/21 Potential to Achieve Goals: Good    Frequency Min 3X/week   Barriers to discharge        Co-evaluation               AM-PAC PT "6 Clicks" Mobility  Outcome Measure Help needed turning from your back to your side while in a flat bed without using bedrails?: None Help needed moving from lying on your back to sitting on the side of a flat bed without using bedrails?: None Help needed moving to and from a bed to a chair (including a wheelchair)?: A Little Help needed standing up from a chair using your arms (e.g., wheelchair or bedside chair)?: A Little Help needed to walk in hospital room?: A Little Help needed climbing 3-5 steps with a railing? : A Little 6 Click Score: 20    End of Session Equipment Utilized During Treatment: Gait belt Activity Tolerance: Patient limited by fatigue;Treatment limited secondary to medical complications (Comment) (SOB/wheezing) Patient left: in bed;with call bell/phone within reach Nurse Communication: Mobility status PT Visit Diagnosis: Other abnormalities of gait and mobility (R26.89)    Time: 5361-4431 PT Time Calculation (min) (ACUTE ONLY): 15 min   Charges:   PT Evaluation $PT Eval Moderate Complexity: 1 Mod          Janet [name], DPT  Acute Rehabilitation Services  Pager: [phone] Office: [phone]   Janet [name] 03/30/2021, 4:04 PM

## 2021-03-31 ENCOUNTER — Encounter (HOSPITAL_COMMUNITY): Payer: Self-pay | Admitting: Internal Medicine

## 2021-03-31 DIAGNOSIS — J45901 Unspecified asthma with (acute) exacerbation: Secondary | ICD-10-CM

## 2021-03-31 DIAGNOSIS — I5021 Acute systolic (congestive) heart failure: Secondary | ICD-10-CM

## 2021-03-31 DIAGNOSIS — I255 Ischemic cardiomyopathy: Secondary | ICD-10-CM

## 2021-03-31 DIAGNOSIS — J9601 Acute respiratory failure with hypoxia: Secondary | ICD-10-CM

## 2021-03-31 LAB — BASIC METABOLIC PANEL
Anion gap: 9 (ref 5–15)
BUN: 16 mg/dL (ref 6–20)
CO2: 26 mmol/L (ref 22–32)
Calcium: 9.6 mg/dL (ref 8.9–10.3)
Chloride: 103 mmol/L (ref 98–111)
Creatinine, Ser: 1.09 mg/dL — ABNORMAL HIGH (ref 0.44–1.00)
GFR, Estimated: >60 mL/min (ref >60)
Glucose, Bld: 120 mg/dL — ABNORMAL HIGH (ref 70–99)
Potassium: 4.4 mmol/L (ref 3.5–5.1)
Sodium: 138 mmol/L (ref 135–145)

## 2021-03-31 LAB — TROPONIN I (HIGH SENSITIVITY): Troponin I (High Sensitivity): 30 ng/L — ABNORMAL HIGH (ref <18)

## 2021-03-31 MED ORDER — ALBUTEROL SULFATE (2.5 MG/3ML) 0.083% IN NEBU: 2.5000 mg | INHALATION_SOLUTION | RESPIRATORY_TRACT | Status: DC | PRN

## 2021-03-31 MED ORDER — ASPIRIN 81 MG PO CHEW
81.0000 mg | CHEWABLE_TABLET | Freq: Every day | ORAL | Status: DC
  Administered 2021-03-31 – 2021-04-03 (×3): 81 mg via ORAL
  Filled 2021-03-31 (×3): qty 1

## 2021-03-31 MED ORDER — ALBUTEROL SULFATE (2.5 MG/3ML) 0.083% IN NEBU: 2.5000 mg | INHALATION_SOLUTION | RESPIRATORY_TRACT | Status: DC

## 2021-03-31 MED ORDER — SODIUM CHLORIDE 0.9% FLUSH
3.0000 mL | Freq: Two times a day (BID) | INTRAVENOUS | Status: DC
  Administered 2021-04-01 – 2021-04-03 (×4): 3 mL via INTRAVENOUS

## 2021-03-31 MED ORDER — ALBUTEROL SULFATE (2.5 MG/3ML) 0.083% IN NEBU: 2.5000 mg | INHALATION_SOLUTION | Freq: Four times a day (QID) | RESPIRATORY_TRACT | Status: DC

## 2021-03-31 MED ORDER — SACUBITRIL-VALSARTAN 24-26 MG PO TABS
1.0000 | ORAL_TABLET | Freq: Two times a day (BID) | ORAL | Status: DC
  Administered 2021-03-31 – 2021-04-03 (×5): 1 via ORAL
  Filled 2021-03-31 (×7): qty 1

## 2021-03-31 MED ORDER — SACUBITRIL-VALSARTAN 24-26 MG PO TABS
1.0000 | ORAL_TABLET | Freq: Two times a day (BID) | ORAL | Status: DC
  Filled 2021-03-31 (×2): qty 1

## 2021-03-31 MED ORDER — ALBUTEROL SULFATE (2.5 MG/3ML) 0.083% IN NEBU
2.5000 mg | INHALATION_SOLUTION | RESPIRATORY_TRACT | Status: DC | PRN
  Administered 2021-03-31 – 2021-04-02 (×2): 2.5 mg via RESPIRATORY_TRACT
  Filled 2021-03-31 (×2): qty 3

## 2021-03-31 MED ORDER — METOPROLOL TARTRATE 12.5 MG HALF TABLET
12.5000 mg | ORAL_TABLET | Freq: Two times a day (BID) | ORAL | Status: DC
  Administered 2021-03-31 (×2): 12.5 mg via ORAL
  Filled 2021-03-31 (×2): qty 1

## 2021-03-31 MED ORDER — ENOXAPARIN SODIUM 60 MG/0.6ML IJ SOSY
60.0000 mg | PREFILLED_SYRINGE | Freq: Every day | INTRAMUSCULAR | Status: DC
  Administered 2021-04-02 – 2021-04-03 (×2): 60 mg via SUBCUTANEOUS
  Filled 2021-03-31 (×2): qty 0.6

## 2021-03-31 NOTE — Progress Notes (Signed)
PROGRESS NOTE    Janet [name]  IRS:[id number]  DOB: 02-19-1966  DOA: 03/29/2021 PCP: Camillia Herter, NP Outpatient Specialists:   Hospital course:  55 yo with polysubstance abuse, asthma, HTN, obesity and chronic depression presented 5/10 with progressive SOB. Work up revealed CT scan with possible RUL ground glass opacities suggestive of atypical infection and bronchial wall thickening c/w bronchitis. She was wheezing diffusely. Sars CoV 2 is negative. UDS positive for cocaine and marijuana. Patient was admitted for treatment of asthma exacerbation.   Echocardiogram with new onset cardiomyopathy with EF of 30 to 35% and regional wall motion abnormalities.  Cardiology was consulted today and they are recommending cardiac catheterization.    Subjective: Patient was seen and examined today.  Stating that she is little improved as compared to before but still not at her baseline.  She endorsed some exertional dyspnea and orthopnea.  Denies any chest pain.  Does not use of oxygen at baseline.  Able to speak in full sentences.  Objective: Vitals:   03/31/21 0403 03/31/21 0612 03/31/21 0614 03/31/21 0919  BP:  118/86  115/74  Pulse:  84  81  Resp:  18  20  Temp:  98 F (36.7 C)  98 F (36.7 C)  TempSrc:  Oral  Oral  SpO2: 96% 96% 98% 96%  Weight:      Height:        Intake/Output Summary (Last 24 hours) at 03/31/2021 1602 Last data filed at 03/31/2021 1302 Gross per 24 hour  Intake 1250.03 ml  Output 0 ml  Net 1250.03 ml   Filed Weights   03/29/21 2226  Weight: 123.8 kg     Exam:  General.  Morbidly obese lady, in no acute distress. Pulmonary.  Bilateral scattered expiratory wheeze, normal respiratory effort. CV.  Regular rate and rhythm, no JVD, rub or murmur. Abdomen.  Soft, nontender, nondistended, BS positive. CNS.  Alert and oriented x3.  No focal neurologic deficit. Extremities.  Lower extremity lymphedema, no cyanosis, pulses intact and  symmetrical. Psychiatry.  Judgment and insight appears normal.  Assessment & Plan:   55 yo female was admitted 5/10 with asthma exacerbation and possible CAP.   Asthma exacerbation.  Started showing some improvement. Continue with DuoNeb Continue albuterol nebulizer q 6h as needed Continue solumedrol to 60mg  q 6h-we will start tapering from tomorrow. Continue oxygen as needed. Try weaning as tolerated.  CAP Patient with mild ground glass opacities on RUL possibly atypical pneumonia Covid is negative Continue treatment with Ceftriaxone and Azithromycin  Elevated troponin Peaked at 33, now down to 17 Patient denies CP but has clear SOB as above.  EKG has Tw inversions 2,3, F and laterally with minimal 1/2 mm st elevation septally.  Patient states she had cardiac cath 3 years ago and was told "my enzyme elevations were because of a panic attack and everything is fine".   CHF Echo done 2021 showed ef 46% with regional wall motion abnormalities as well as Grade 2 diastolic dysfunction.  This was attributed to her HTN per chart.  As noted above, she has h/o likely Takatsubo's  Repeat echo with EF of 30 to 35%.  Cardiology was consulted and they are recommending proceeding with cardiac catheterization and starting her on heart failure medications. Appreciate their help.  HTN Cardiology is going to discontinue losartan and starting her on Entresto. Currently holding HCTZ-she will be started on Lasix and spironolactone after cath.  Substance Use Counseling was provided again.  Depression  Continue abilify and trazodone   DVT prophylaxis: lovenox Code Status: full Family Communication: Discussed with patient Disposition Plan:   Patient is from:  home  Anticipated Discharge Location: home   Barriers to Discharge:  none  Is patient medically stable for Discharge:  No, ongoing wheezing and cardiac w/u pending.     Consultants:  Cardiology  Procedures:  none  Antimicrobials:  Ceftriaxone  Azithromycin    Data Reviewed:  Basic Metabolic Panel: Recent Labs  Lab 03/29/21 2054 03/30/21 0444 03/31/21 0337  NA 136 136 138  K 3.7 3.7 4.4  CL 105 107 103  CO2 25 21* 26  GLUCOSE 110* 216* 120*  BUN 12 13 16   CREATININE 1.05* 1.09* 1.09*  CALCIUM 8.7* 8.7* 9.6  MG  --  2.1  --   PHOS  --  2.6  --    Liver Function Tests: No results for input(s): AST, ALT, ALKPHOS, BILITOT, PROT, ALBUMIN in the last 168 hours. No results for input(s): LIPASE, AMYLASE in the last 168 hours. No results for input(s): AMMONIA in the last 168 hours. CBC: Recent Labs  Lab 03/29/21 2054 03/30/21 0444  WBC 7.9 9.9  HGB 14.0 14.0  HCT 43.9 44.5  MCV 85.1 85.9  PLT 214 203   Cardiac Enzymes: No results for input(s): CKTOTAL, CKMB, CKMBINDEX, TROPONINI in the last 168 hours. BNP (last 3 results) No results for input(s): PROBNP in the last 8760 hours. CBG: No results for input(s): GLUCAP in the last 168 hours.  Recent Results (from the past 240 hour(s))  Resp Panel by RT-PCR (Flu A&B, Covid) Nasopharyngeal Swab     Status: None   Collection Time: 03/29/21 11:00 PM   Specimen: Nasopharyngeal Swab; Nasopharyngeal(NP) swabs in vial transport medium  Result Value Ref Range Status   SARS Coronavirus 2 by RT PCR NEGATIVE NEGATIVE Final    Comment: (NOTE) SARS-CoV-2 target nucleic acids are NOT DETECTED.  The SARS-CoV-2 RNA is generally detectable in upper respiratory specimens during the acute phase of infection. The lowest concentration of SARS-CoV-2 viral copies this assay can detect is 138 copies/mL. A negative result does not preclude SARS-Cov-2 infection and should not be used as the sole basis for treatment or other patient management decisions. A negative result may occur with  improper specimen collection/handling, submission of specimen other than nasopharyngeal swab, presence of viral  mutation(s) within the areas targeted by this assay, and inadequate number of viral copies(<138 copies/mL). A negative result must be combined with clinical observations, patient history, and epidemiological information. The expected result is Negative.  Fact Sheet for Patients:  EntrepreneurPulse.com.au  Fact Sheet for Healthcare Providers:  IncredibleEmployment.be  This test is no t yet approved or cleared by the [place] FDA and  has been authorized for detection and/or diagnosis of SARS-CoV-2 by FDA under an Emergency Use Authorization (EUA). This EUA will remain  in effect (meaning this test can be used) for the duration of the COVID-19 declaration under Section 564(b)(1) of the Act, 21 U.S.C.section 360bbb-3(b)(1), unless the authorization is terminated  or revoked sooner.       Influenza A by PCR NEGATIVE NEGATIVE Final   Influenza B by PCR NEGATIVE NEGATIVE Final    Comment: (NOTE) The Xpert Xpress SARS-CoV-2/FLU/RSV plus assay is intended as an aid in the diagnosis of influenza from Nasopharyngeal swab specimens and should not be used as a sole basis for treatment. Nasal washings and aspirates are unacceptable for Xpert Xpress SARS-CoV-2/FLU/RSV testing.  Fact Sheet for  Patients: EntrepreneurPulse.com.au  Fact Sheet for Healthcare Providers: IncredibleEmployment.be  This test is not yet approved or cleared by the [place] FDA and has been authorized for detection and/or diagnosis of SARS-CoV-2 by FDA under an Emergency Use Authorization (EUA). This EUA will remain in effect (meaning this test can be used) for the duration of the COVID-19 declaration under Section 564(b)(1) of the Act, 21 U.S.C. section 360bbb-3(b)(1), unless the authorization is terminated or revoked.  Performed at [facility], [place] [address], [place], [place] [address]       Studies: DG Chest 2  View  Result Date: 03/29/2021 CLINICAL DATA:  Chest pain EXAM: CHEST - 2 VIEW COMPARISON:  12/08/2020 FINDINGS: Borderline cardiomegaly. Both lungs are clear. The visualized skeletal structures are unremarkable. IMPRESSION: No active cardiopulmonary disease. Electronically Signed   By: Donavan Foil M.D.   On: 03/29/2021 21:14   CT Angio Chest PE W and/or Wo Contrast  Result Date: 03/30/2021 CLINICAL DATA:  55 year old female with concern for pulmonary embolism. EXAM: CT ANGIOGRAPHY CHEST WITH CONTRAST TECHNIQUE: Multidetector CT imaging of the chest was performed using the standard protocol during bolus administration of intravenous contrast. Multiplanar CT image reconstructions and MIPs were obtained to evaluate the vascular anatomy. CONTRAST:  126mL OMNIPAQUE IOHEXOL 350 MG/ML SOLN COMPARISON:  Chest radiograph dated 03/29/2021. FINDINGS: Cardiovascular: Mild cardiomegaly. No pericardial effusion. The thoracic aorta is unremarkable. No pulmonary artery embolus identified. Mediastinum/Nodes: No hilar or mediastinal adenopathy. The esophagus is grossly unremarkable as visualized. No mediastinal fluid collection. Lungs/Pleura: No focal consolidation, pleural effusion, or pneumothorax. Several faint small ground-glass nodule in the right upper lobe measuring up to 3 mm (34/6) may represent atypical infection. There is diffuse thickening of the bronchial wall concerning for bronchitis. The central airways remain patent. Upper Abdomen: No acute abnormality. Musculoskeletal: Degenerative changes of the spine. No acute osseous pathology. Review of the MIP images confirms the above findings. IMPRESSION: 1. No CT evidence of pulmonary embolism. 2. Diffuse thickening of the bronchial wall concerning for bronchitis. 3. Several faint small ground-glass nodule in the right upper lobe may represent atypical infection. Electronically Signed   By: Anner Crete M.D.   On: 03/30/2021 00:29   ECHOCARDIOGRAM  LIMITED  Result Date: 03/30/2021    ECHOCARDIOGRAM LIMITED REPORT   Patient Name:   KALENNA MILLETT Date of Exam: 03/30/2021 Medical Rec #:  [id number]         Height:       68.0 in Accession #:    [id number]        Weight:       273.0 lb Date of Birth:  05/26/1966         BSA:          2.333 m Patient Age:    14 years          BP:           154/98 mmHg Patient Gender: F                 HR:           90 bpm. Exam Location:  Inpatient Procedure: 2D Echo, Cardiac Doppler and Color Doppler Indications:    Elevated Troponin  History:        Patient has prior history of Echocardiogram examinations, most                 recent 08/12/2020. Signs/Symptoms:Shortness of Breath. Coughing.  Sonographer:    Merrie Roof Referring Phys:  [id number] Tangent Comments: This was a limited echo for LV function IMPRESSIONS  1. Limited study for LV function; limited views and no doppler performed; global hypokinesis and akinesis of the inferolateral wall with overall moderate to severe LV dysfunction.  2. Left ventricular ejection fraction, by estimation, is 30 to 35%. The left ventricle has moderate to severely decreased function. The left ventricle demonstrates regional wall motion abnormalities (see scoring diagram/findings for description). The left ventricular internal cavity size was moderately dilated. There is mild left ventricular hypertrophy.  3. Right ventricular systolic function is normal. The right ventricular size is normal.  4. Left atrial size was mildly dilated.  5. Aortic dilatation noted. There is mild dilatation of the aortic root, measuring 41 mm. FINDINGS  Left Ventricle: Left ventricular ejection fraction, by estimation, is 30 to 35%. The left ventricle has moderate to severely decreased function. The left ventricle demonstrates regional wall motion abnormalities. The left ventricular internal cavity size was moderately dilated. There is mild left ventricular hypertrophy. Right Ventricle: The right  ventricular size is normal. Right ventricular systolic function is normal. Left Atrium: Left atrial size was mildly dilated. Right Atrium: Right atrial size was normal in size. Pericardium: There is no evidence of pericardial effusion. Aorta: Aortic dilatation noted. There is mild dilatation of the aortic root, measuring 41 mm. Additional Comments: Limited study for LV function; limited views and no doppler performed; global hypokinesis and akinesis of the inferolateral wall with overall moderate to severe LV dysfunction.  LV Volumes (MOD) LV vol d, MOD A2C: 164.0 ml LV vol d, MOD A4C: 166.0 ml LV vol s, MOD A2C: 103.0 ml LV vol s, MOD A4C: 98.7 ml LV SV MOD A2C:     61.0 ml LV SV MOD A4C:     166.0 ml LV SV MOD BP:      65.4 ml Kirk Ruths MD Electronically signed by Kirk Ruths MD Signature Date/Time: 03/30/2021/6:06:49 PM    Final      Scheduled Meds: . ARIPiprazole  10 mg Oral Daily  . aspirin  81 mg Oral Daily  . [START ON 04/01/2021] enoxaparin (LOVENOX) injection  60 mg Subcutaneous Daily  . guaiFENesin  1,200 mg Oral BID  . hydrOXYzine  10 mg Oral BID  . ipratropium-albuterol  3 mL Nebulization Q6H  . methylPREDNISolone (SOLU-MEDROL) injection  60 mg Intravenous Q6H  . metoprolol tartrate  12.5 mg Oral BID  . montelukast  10 mg Oral QHS  . sacubitril-valsartan  1 tablet Oral BID  . sodium chloride flush  3 mL Intravenous Q12H  . traZODone  100 mg Oral QHS   Continuous Infusions: . azithromycin 500 mg (03/30/21 2253)  . cefTRIAXone (ROCEPHIN)  IV 1 g (03/30/21 2051)    Active Problems:   Asthma exacerbation  Lorella Nimrod, MD [facility]  This record has been created using Systems analyst. Errors have been sought and corrected,but may not always be located. Such creation errors do not reflect on the standard of care.  If 7PM-7AM, please contact night-coverage [email or web address]   LOS: 1 day

## 2021-03-31 NOTE — Progress Notes (Signed)
Physical Therapy Treatment Patient Details Name: Janet [name] MRN: [id number] DOB: [birth year] Today's Date: 03/31/2021    History of Present Illness Pt is a 55 y/o female admitted 5/10 secondary to increased SOB. Thought to be secondary to asthma exacerbation. PMH includes polysubstance abuse and HTN.    PT Comments    Pt fully participated in session. Pt performed functional mobility tasks mod I. Pt states she feels like she is at her baseline with just a slight SOB, O2 remained >94% on RA during session. Pt is safe to d/c home and no further acute PT needs noted    Follow Up Recommendations  No PT follow up     Equipment Recommendations       Recommendations for Other Services       Precautions / Restrictions      Mobility  Bed Mobility Overal bed mobility: Independent                  Transfers Overall transfer level: Modified independent Equipment used: None Transfers: Sit to/from Stand Sit to Stand: Modified independent (Device/Increase time)            Ambulation/Gait Ambulation/Gait assistance: Modified independent (Device/Increase time) Gait Distance (Feet): 500 Feet Assistive device: IV Pole       General Gait Details: decreased velocity, O2 remailed > 94% during session   Stairs             Wheelchair Mobility    Modified Rankin (Stroke Patients Only)       Balance                                            Cognition                                              Exercises      General Comments        Pertinent Vitals/Pain      Home Living                      Prior Function            PT Goals (current goals can now be found in the care plan section) Acute Rehab PT Goals Patient Stated Goal: to feel better PT Goal Formulation: With patient Time For Goal Achievement: 04/13/21 Potential to Achieve Goals: Good Progress towards PT goals: Goals met/education  completed, patient discharged from PT    Frequency           PT Plan Frequency needs to be updated;Other (comment) (pt to d/c from PT caseload. Pt is mod I)    Co-evaluation              AM-PAC PT "6 Clicks" Mobility   Outcome Measure  Help needed turning from your back to your side while in a flat bed without using bedrails?: None Help needed moving from lying on your back to sitting on the side of a flat bed without using bedrails?: None Help needed moving to and from a bed to a chair (including a wheelchair)?: None Help needed standing up from a chair using your arms (e.g., wheelchair or bedside chair)?: None Help needed to walk in hospital room?: None Help needed  climbing 3-5 steps with a railing? : A Little 6 Click Score: 23    End of Session Equipment Utilized During Treatment: Gait belt Activity Tolerance: Patient tolerated treatment well Patient left: in chair;with call bell/phone within reach Nurse Communication: Mobility status PT Visit Diagnosis: Other abnormalities of gait and mobility (R26.89)     Time: 7591-6384 PT Time Calculation (min) (ACUTE ONLY): 14 min  Charges:  $Gait Training: 8-22 mins                     Lyanne Co, DPT Acute Rehabilitation Services [phone]   [place] 03/31/2021, 12:52 PM

## 2021-03-31 NOTE — Progress Notes (Signed)
Physical Therapy Discharge Patient Details Name: Janet [name] MRN: [id number] DOB: 03/17/1966 Today's Date: 03/31/2021 Time: 1149-1203 PT Time Calculation (min) (ACUTE ONLY): 14 min  Patient discharged from PT services secondary to goals met and no further PT needs identified.  Please see latest therapy progress note for current level of functioning and progress toward goals.    Progress and discharge plan discussed with patient and/or caregiver: Patient/Caregiver agrees with plan  GP   Gia Lusher H, DPT Acute Rehabilitation Services [phone]  Janet [name] 03/31/2021, 12:54 PM  

## 2021-03-31 NOTE — Plan of Care (Signed)
  Problem: Education: Goal: Knowledge of General Education information will improve Description: Including pain rating scale, medication(s)/side effects and non-pharmacologic comfort measures Outcome: Completed/Met   Problem: Health Behavior/Discharge Planning: Goal: Ability to manage health-related needs will improve Outcome: Completed/Met   Problem: Clinical Measurements: Goal: Diagnostic test results will improve Outcome: Completed/Met

## 2021-03-31 NOTE — Consult Note (Addendum)
Cardiology Consultation:   Patient ID: Janet [name] MRN: [id number]; DOB: [birth year]  Admit date: 03/29/2021 Date of Consult: 03/31/2021  PCP:  Camillia Herter, NP   [facility] Providers Cardiologist:  Dr Acie Fredrickson  Patient Profile:   Janet [name] is a 55 y.o. female with a hx of substance abuse, hypertension, depression, asthma who is being seen 03/31/2021 for the evaluation of elevated troponin, dyspnea and cardiomyopathy at the request of Lorella Nimrod MD.  History of Present Illness:   Patient seen by Dr. Acie Fredrickson September 2021 for dyspnea.  Echocardiogram September 2021 showed ejection fraction 46%, mild left ventricular enlargement, mild left ventricular hypertrophy, grade 2 diastolic dysfunction, mildly dilated aortic root at 42 mm.  Follow-up as scheduled but apparently not completed.  Patient admitted May 11 with complaints of worsening dyspnea and productive cough.  Drug screen positive for cocaine and marijuana.  Troponin minimally elevated and echocardiogram showed reduced LV function.  Cardiology now asked to evaluate.  Note patient has been treated with antibiotics, steroids and bronchodilators with improvement.  Patient states that she has chronic dyspnea on exertion.  However 2 weeks prior to her admission she noticed that this was increasing.  There is also component of orthopnea and mild worsening lower extremity edema (she has chronic lymphedema bilaterally).  She felt a mild chest pain when she coughed.  She denies fevers or chills but noted a cough productive of yellow sputum.  No hemoptysis though she did describe pink sputum at times.   Past Medical History:  Diagnosis Date  . Asthma   . Depression   . Elevated brain natriuretic peptide (BNP) level   . Hypertension   . Lymphedema   . SOB (shortness of breath)     Past Surgical History:  Procedure Laterality Date  . ABDOMINAL HYSTERECTOMY    . CESAREAN SECTION    . HERNIA REPAIR    . TUBAL LIGATION       Inpatient Medications: Scheduled Meds: . ARIPiprazole  10 mg Oral Daily  . enoxaparin (LOVENOX) injection  40 mg Subcutaneous Daily  . guaiFENesin  1,200 mg Oral BID  . losartan  100 mg Oral Daily   And  . hydrochlorothiazide  12.5 mg Oral Daily  . hydrOXYzine  10 mg Oral BID  . ipratropium-albuterol  3 mL Nebulization Q6H  . methylPREDNISolone (SOLU-MEDROL) injection  60 mg Intravenous Q6H  . montelukast  10 mg Oral QHS  . traZODone  100 mg Oral QHS   Continuous Infusions: . azithromycin 500 mg (03/30/21 2253)  . cefTRIAXone (ROCEPHIN)  IV 1 g (03/30/21 2051)   PRN Meds: acetaminophen, albuterol, melatonin, ondansetron (ZOFRAN) IV  Allergies:    Allergies  Allergen Reactions  . Clarithromycin Hives    GI upset  . Penicillins Rash and Hives  . Whey     Unknown reaction - showed on allergy test    Social History:   Social History   Socioeconomic History  . Marital status: Single    Spouse name: Not on file  . Number of children: 2  . Years of education: Not on file  . Highest education level: Not on file  Occupational History  . Not on file  Tobacco Use  . Smoking status: Passive Smoke Exposure - Never Smoker  . Smokeless tobacco: Never Used  Vaping Use  . Vaping Use: Never used  Substance and Sexual Activity  . Alcohol use: Yes    Comment: occasional  . Drug use: Yes    Types:  Marijuana, "Crack" cocaine    Comment: once/month  . Sexual activity: Yes    Birth control/protection: Surgical  Other Topics Concern  . Not on file  Social History Narrative  . Not on file   Social Determinants of Health   Financial Resource Strain: Not on file  Food Insecurity: Not on file  Transportation Needs: Not on file  Physical Activity: Not on file  Stress: Not on file  Social Connections: Not on file  Intimate Partner Violence: Not on file    Family History:    Family History  Problem Relation Age of Onset  . Hypertension Mother   . Stroke Mother   .  Deep vein thrombosis Mother   . Congestive Heart Failure Mother   . Obesity Sister   . Obesity Brother   . Congestive Heart Failure Maternal Grandmother      ROS:  Cough productive of yellow sputum but no hemoptysis.  All other ROS reviewed and negative.     Physical Exam/Data:   Vitals:   03/31/21 0403 03/31/21 0612 03/31/21 0614 03/31/21 0919  BP:  118/86  115/74  Pulse:  84  81  Resp:  18  20  Temp:  98 F (36.7 C)  98 F (36.7 C)  TempSrc:  Oral  Oral  SpO2: 96% 96% 98% 96%  Weight:      Height:        Intake/Output Summary (Last 24 hours) at 03/31/2021 1147 Last data filed at 03/31/2021 0915 Gross per 24 hour  Intake 1310.03 ml  Output 0 ml  Net 1310.03 ml   Last 3 Weights 03/29/2021 03/21/2021 01/12/2021  Weight (lbs) 273 lb 273 lb 279 lb  Weight (kg) 123.832 kg 123.832 kg 126.554 kg     Body mass index is 41.51 kg/m.  General:  Well nourished, obese, in no acute distres HEENT: normal Lymph: no adenopathy Neck: no JVD Endocrine:  No thryomegaly Vascular: No carotid bruits; FA pulses 2+ bilaterally without bruits  Cardiac:  normal S1, S2; RRR; no murmur  Lungs: Mild diffuse expiratory wheezing Abd: soft, nontender, no hepatomegaly  Ext: 1+ bilateral lower extremity edema at the ankles (lymphedema). Musculoskeletal:  No deformities, BUE and BLE strength normal and equal Skin: warm and dry  Neuro:  CNs 2-12 intact, no focal abnormalities noted Psych:  Normal affect   EKG:  The EKG was personally reviewed and demonstrates: Normal sinus rhythm with PVCs, left ventricular hypertrophy, lateral T wave inversion. Telemetry:  Telemetry was personally reviewed and demonstrates: Normal sinus rhythm to sinus tachycardia with rare PAC and PVC  Relevant CV Studies: 1. Limited study for LV function; limited views and no doppler performed;  global hypokinesis and akinesis of the inferolateral wall with overall  moderate to severe LV dysfunction.  2. Left ventricular  ejection fraction, by estimation, is 30 to 35%. The  left ventricle has moderate to severely decreased function. The left  ventricle demonstrates regional wall motion abnormalities (see scoring  diagram/findings for description). The  left ventricular internal cavity size was moderately dilated. There is  mild left ventricular hypertrophy.  3. Right ventricular systolic function is normal. The right ventricular  size is normal.  4. Left atrial size was mildly dilated.  5. Aortic dilatation noted. There is mild dilatation of the aortic root,  measuring 41 mm.   Laboratory Data:  High Sensitivity Troponin:   Recent Labs  Lab 03/29/21 2054 03/29/21 2250 03/30/21 0444 03/30/21 0800 03/31/21 0337  TROPONINIHS 30* 33* 17  17 30*     Chemistry Recent Labs  Lab 03/29/21 2054 03/30/21 0444 03/31/21 0337  NA 136 136 138  K 3.7 3.7 4.4  CL 105 107 103  CO2 25 21* 26  GLUCOSE 110* 216* 120*  BUN 12 13 16   CREATININE 1.05* 1.09* 1.09*  CALCIUM 8.7* 8.7* 9.6  GFRNONAA >60 >60 >60  ANIONGAP 6 8 9     Hematology Recent Labs  Lab 03/29/21 2054 03/30/21 0444  WBC 7.9 9.9  RBC 5.16* 5.18*  HGB 14.0 14.0  HCT 43.9 44.5  MCV 85.1 85.9  MCH 27.1 27.0  MCHC 31.9 31.5  RDW 15.0 15.1  PLT 214 203   BNP Recent Labs  Lab 03/29/21 2230  BNP 336.1*    Radiology/Studies:  DG Chest 2 View  Result Date: 03/29/2021 CLINICAL DATA:  Chest pain EXAM: CHEST - 2 VIEW COMPARISON:  12/08/2020 FINDINGS: Borderline cardiomegaly. Both lungs are clear. The visualized skeletal structures are unremarkable. IMPRESSION: No active cardiopulmonary disease. Electronically Signed   By: Donavan Foil M.D.   On: 03/29/2021 21:14   CT Angio Chest PE W and/or Wo Contrast  Result Date: 03/30/2021 CLINICAL DATA:  55 year old female with concern for pulmonary embolism. EXAM: CT ANGIOGRAPHY CHEST WITH CONTRAST TECHNIQUE: Multidetector CT imaging of the chest was performed using the standard protocol  during bolus administration of intravenous contrast. Multiplanar CT image reconstructions and MIPs were obtained to evaluate the vascular anatomy. CONTRAST:  152mL OMNIPAQUE IOHEXOL 350 MG/ML SOLN COMPARISON:  Chest radiograph dated 03/29/2021. FINDINGS: Cardiovascular: Mild cardiomegaly. No pericardial effusion. The thoracic aorta is unremarkable. No pulmonary artery embolus identified. Mediastinum/Nodes: No hilar or mediastinal adenopathy. The esophagus is grossly unremarkable as visualized. No mediastinal fluid collection. Lungs/Pleura: No focal consolidation, pleural effusion, or pneumothorax. Several faint small ground-glass nodule in the right upper lobe measuring up to 3 mm (34/6) may represent atypical infection. There is diffuse thickening of the bronchial wall concerning for bronchitis. The central airways remain patent. Upper Abdomen: No acute abnormality. Musculoskeletal: Degenerative changes of the spine. No acute osseous pathology. Review of the MIP images confirms the above findings. IMPRESSION: 1. No CT evidence of pulmonary embolism. 2. Diffuse thickening of the bronchial wall concerning for bronchitis. 3. Several faint small ground-glass nodule in the right upper lobe may represent atypical infection. Electronically Signed   By: Anner Crete M.D.   On: 03/30/2021 00:29   ECHOCARDIOGRAM LIMITED  Result Date: 03/30/2021    ECHOCARDIOGRAM LIMITED REPORT   Patient Name:   Janet [name] Date of Exam: 03/30/2021 Medical Rec #:  [id number]         Height:       68.0 in Accession #:    [id number]        Weight:       273.0 lb Date of Birth:  02-15-1966         BSA:          2.333 m Patient Age:    45 years          BP:           154/98 mmHg Patient Gender: F                 HR:           90 bpm. Exam Location:  Inpatient Procedure: 2D Echo, Cardiac Doppler and Color Doppler Indications:    Elevated Troponin  History:        Patient has prior history of Echocardiogram  examinations, most                  recent 08/12/2020. Signs/Symptoms:Shortness of Breath. Coughing.  Sonographer:    Merrie Roof Referring Phys: [id number] Kayleen Memos  Sonographer Comments: This was a limited echo for LV function IMPRESSIONS  1. Limited study for LV function; limited views and no doppler performed; global hypokinesis and akinesis of the inferolateral wall with overall moderate to severe LV dysfunction.  2. Left ventricular ejection fraction, by estimation, is 30 to 35%. The left ventricle has moderate to severely decreased function. The left ventricle demonstrates regional wall motion abnormalities (see scoring diagram/findings for description). The left ventricular internal cavity size was moderately dilated. There is mild left ventricular hypertrophy.  3. Right ventricular systolic function is normal. The right ventricular size is normal.  4. Left atrial size was mildly dilated.  5. Aortic dilatation noted. There is mild dilatation of the aortic root, measuring 41 mm. FINDINGS  Left Ventricle: Left ventricular ejection fraction, by estimation, is 30 to 35%. The left ventricle has moderate to severely decreased function. The left ventricle demonstrates regional wall motion abnormalities. The left ventricular internal cavity size was moderately dilated. There is mild left ventricular hypertrophy. Right Ventricle: The right ventricular size is normal. Right ventricular systolic function is normal. Left Atrium: Left atrial size was mildly dilated. Right Atrium: Right atrial size was normal in size. Pericardium: There is no evidence of pericardial effusion. Aorta: Aortic dilatation noted. There is mild dilatation of the aortic root, measuring 41 mm. Additional Comments: Limited study for LV function; limited views and no doppler performed; global hypokinesis and akinesis of the inferolateral wall with overall moderate to severe LV dysfunction.  LV Volumes (MOD) LV vol d, MOD A2C: 164.0 ml LV vol d, MOD A4C: 166.0 ml LV vol s, MOD  A2C: 103.0 ml LV vol s, MOD A4C: 98.7 ml LV SV MOD A2C:     61.0 ml LV SV MOD A4C:     166.0 ml LV SV MOD BP:      65.4 ml Kirk Ruths MD Electronically signed by Kirk Ruths MD Signature Date/Time: 03/30/2021/6:06:49 PM    Final      Assessment and Plan:   1. Cardiomyopathy-I have personally reviewed the patient's echocardiogram.  LV function is moderate to severely reduced and there is noted wall motion abnormalities with inferolateral akinesis.  We will need to proceed with cardiac catheterization to exclude coronary disease.  Risk and benefits including myocardial infarction, CVA and death discussed and she agrees to proceed.  We will add aspirin 81 mg daily.  Discontinue losartan and instead treat with Entresto 24/26 twice daily.  Advance as needed.  She has mild wheezing on examination but I would like to see if she will tolerate beta-blockade given reduced LV function.  We will begin metoprolol 12.5 mg twice daily.  If she tolerates will transition to Toprol.  I will avoid carvedilol given that is nonselective.  We will hold hydrochlorothiazide prior to catheterization.  Once complete we will add low-dose Lasix and spironolactone.  Add Jardiance prior to discharge. 2. Acute systolic congestive heart failure-predominance of symptoms likely related to URI/asthma but she does describe worsening bilateral lower extremity edema and orthopnea.  We will add diuretics post catheterization as outlined. 3. Minimal elevation in troponin-patient has not had chest pain consistent with ischemic etiology and there is no clear trend.  Presentation is not consistent with acute coronary syndrome. 4. Asthma/URI-continue antibiotics, steroids and  bronchodilators per primary care. 5. Cocaine abuse-we discussed the importance of avoiding.   Risk Assessment/Risk Scores:   New York Heart Association (NYHA) Functional Class NYHA Class II        For questions or updates, please contact [facility] Please  consult [email or web address] for contact info under    Signed, Kirk Ruths, MD  03/31/2021 11:47 AM

## 2021-03-31 NOTE — Plan of Care (Signed)
  Problem: Activity: Goal: Risk for activity intolerance will decrease Outcome: Progressing   Problem: Nutrition: Goal: Adequate nutrition will be maintained Outcome: Progressing   Problem: Safety: Goal: Ability to remain free from injury will improve Outcome: Progressing   

## 2021-04-01 ENCOUNTER — Telehealth: Payer: BC Managed Care – PPO | Admitting: Family

## 2021-04-01 ENCOUNTER — Encounter (HOSPITAL_COMMUNITY)
Admission: EM | Disposition: A | Discharge status: Home / Self Care | Payer: Self-pay | Source: Ambulatory Visit | Attending: Internal Medicine

## 2021-04-01 DIAGNOSIS — I5021 Acute systolic (congestive) heart failure: Secondary | ICD-10-CM | POA: Diagnosis present

## 2021-04-01 HISTORY — PX: RIGHT/LEFT HEART CATH AND CORONARY ANGIOGRAPHY: CATH118266

## 2021-04-01 LAB — POCT I-STAT EG7
Acid-Base Excess: 4 mmol/L — ABNORMAL HIGH (ref 0.0–2.0)
Acid-Base Excess: 4 mmol/L — ABNORMAL HIGH (ref 0.0–2.0)
Bicarbonate: 32 mmol/L — ABNORMAL HIGH (ref 20.0–28.0)
Bicarbonate: 32.4 mmol/L — ABNORMAL HIGH (ref 20.0–28.0)
Calcium, Ion: 1.28 mmol/L (ref 1.15–1.40)
Calcium, Ion: 1.29 mmol/L (ref 1.15–1.40)
HCT: 45 % (ref 36.0–46.0)
HCT: 46 % (ref 36.0–46.0)
Hemoglobin: 15.3 g/dL — ABNORMAL HIGH (ref 12.0–15.0)
Hemoglobin: 15.6 g/dL — ABNORMAL HIGH (ref 12.0–15.0)
O2 Saturation: 72 %
O2 Saturation: 73 %
Potassium: 4.2 mmol/L (ref 3.5–5.1)
Potassium: 4.2 mmol/L (ref 3.5–5.1)
Sodium: 141 mmol/L (ref 135–145)
Sodium: 141 mmol/L (ref 135–145)
TCO2: 34 mmol/L — ABNORMAL HIGH (ref 22–32)
TCO2: 34 mmol/L — ABNORMAL HIGH (ref 22–32)
pCO2, Ven: 63.8 mmHg — ABNORMAL HIGH (ref 44.0–60.0)
pCO2, Ven: 64 mmHg — ABNORMAL HIGH (ref 44.0–60.0)
pH, Ven: 7.308 (ref 7.250–7.430)
pH, Ven: 7.312 (ref 7.250–7.430)
pO2, Ven: 43 mmHg (ref 32.0–45.0)
pO2, Ven: 43 mmHg (ref 32.0–45.0)

## 2021-04-01 LAB — POCT I-STAT 7, (LYTES, BLD GAS, ICA,H+H)
Acid-Base Excess: 3 mmol/L — ABNORMAL HIGH (ref 0.0–2.0)
Bicarbonate: 31.6 mmol/L — ABNORMAL HIGH (ref 20.0–28.0)
Calcium, Ion: 1.28 mmol/L (ref 1.15–1.40)
HCT: 46 % (ref 36.0–46.0)
Hemoglobin: 15.6 g/dL — ABNORMAL HIGH (ref 12.0–15.0)
O2 Saturation: 99 %
Potassium: 4.1 mmol/L (ref 3.5–5.1)
Sodium: 140 mmol/L (ref 135–145)
TCO2: 33 mmol/L — ABNORMAL HIGH (ref 22–32)
pCO2 arterial: 62.3 mmHg — ABNORMAL HIGH (ref 32.0–48.0)
pH, Arterial: 7.313 — ABNORMAL LOW (ref 7.350–7.450)
pO2, Arterial: 150 mmHg — ABNORMAL HIGH (ref 83.0–108.0)

## 2021-04-01 SURGERY — RIGHT/LEFT HEART CATH AND CORONARY ANGIOGRAPHY
Anesthesia: LOCAL

## 2021-04-01 MED ORDER — HEPARIN (PORCINE) IN NACL 1000-0.9 UT/500ML-% IV SOLN
INTRAVENOUS | Status: DC | PRN
  Administered 2021-04-01 (×2): 500 mL

## 2021-04-01 MED ORDER — METHYLPREDNISOLONE SODIUM SUCC 125 MG IJ SOLR: 60.0000 mg | Freq: Two times a day (BID) | INTRAMUSCULAR | Status: DC

## 2021-04-01 MED ORDER — MIDAZOLAM HCL 2 MG/2ML IJ SOLN
INTRAMUSCULAR | Status: AC
  Filled 2021-04-01: qty 2

## 2021-04-01 MED ORDER — IOHEXOL 350 MG/ML SOLN
INTRAVENOUS | Status: DC | PRN
  Administered 2021-04-01: 45 mL

## 2021-04-01 MED ORDER — LIDOCAINE HCL (PF) 1 % IJ SOLN
INTRAMUSCULAR | Status: DC | PRN
  Administered 2021-04-01: 5 mL

## 2021-04-01 MED ORDER — SODIUM CHLORIDE 0.9 % IV SOLN
250.0000 mL | INTRAVENOUS | Status: DC | PRN
  Administered 2021-04-01 – 2021-04-02 (×2): 250 mL via INTRAVENOUS

## 2021-04-01 MED ORDER — LIDOCAINE HCL (PF) 1 % IJ SOLN
INTRAMUSCULAR | Status: AC
  Filled 2021-04-01: qty 30

## 2021-04-01 MED ORDER — VERAPAMIL HCL 2.5 MG/ML IV SOLN
INTRAVENOUS | Status: DC | PRN
  Administered 2021-04-01: 10 mL via INTRA_ARTERIAL

## 2021-04-01 MED ORDER — SODIUM CHLORIDE 0.9 % IV SOLN: INTRAVENOUS | Status: DC

## 2021-04-01 MED ORDER — ONDANSETRON HCL 4 MG/2ML IJ SOLN: 4.0000 mg | Freq: Four times a day (QID) | INTRAMUSCULAR | Status: DC | PRN

## 2021-04-01 MED ORDER — SODIUM CHLORIDE 0.9% FLUSH: 3.0000 mL | INTRAVENOUS | Status: DC | PRN

## 2021-04-01 MED ORDER — FUROSEMIDE 10 MG/ML IJ SOLN
80.0000 mg | Freq: Once | INTRAMUSCULAR | Status: AC
  Administered 2021-04-01: 80 mg via INTRAVENOUS
  Filled 2021-04-01: qty 8

## 2021-04-01 MED ORDER — ASPIRIN 81 MG PO CHEW: 81.0000 mg | CHEWABLE_TABLET | ORAL | Status: DC

## 2021-04-01 MED ORDER — METOPROLOL SUCCINATE ER 25 MG PO TB24
25.0000 mg | ORAL_TABLET | Freq: Every day | ORAL | Status: DC
  Administered 2021-04-01 – 2021-04-03 (×3): 25 mg via ORAL
  Filled 2021-04-01 (×3): qty 1

## 2021-04-01 MED ORDER — HYDRALAZINE HCL 20 MG/ML IJ SOLN: 10.0000 mg | INTRAMUSCULAR | Status: AC | PRN

## 2021-04-01 MED ORDER — SODIUM CHLORIDE 0.9% FLUSH
3.0000 mL | Freq: Two times a day (BID) | INTRAVENOUS | Status: DC
  Administered 2021-04-01 – 2021-04-03 (×3): 3 mL via INTRAVENOUS

## 2021-04-01 MED ORDER — VERAPAMIL HCL 2.5 MG/ML IV SOLN
INTRAVENOUS | Status: AC
  Filled 2021-04-01: qty 2

## 2021-04-01 MED ORDER — MIDAZOLAM HCL 2 MG/2ML IJ SOLN
INTRAMUSCULAR | Status: DC | PRN
  Administered 2021-04-01: 2 mg via INTRAVENOUS

## 2021-04-01 MED ORDER — SODIUM CHLORIDE 0.9 % IV SOLN
INTRAVENOUS | Status: AC | PRN
  Administered 2021-04-01: 10 mL/h via INTRAVENOUS

## 2021-04-01 MED ORDER — SODIUM CHLORIDE 0.9 % IV SOLN: 250.0000 mL | INTRAVENOUS | Status: DC | PRN

## 2021-04-01 MED ORDER — FENTANYL CITRATE (PF) 100 MCG/2ML IJ SOLN
INTRAMUSCULAR | Status: AC
  Filled 2021-04-01: qty 2

## 2021-04-01 MED ORDER — ACETAMINOPHEN 325 MG PO TABS: 650.0000 mg | ORAL_TABLET | ORAL | Status: DC | PRN

## 2021-04-01 MED ORDER — HEPARIN (PORCINE) IN NACL 1000-0.9 UT/500ML-% IV SOLN
INTRAVENOUS | Status: AC
  Filled 2021-04-01: qty 1000

## 2021-04-01 MED ORDER — ASPIRIN 81 MG PO CHEW
81.0000 mg | CHEWABLE_TABLET | ORAL | Status: AC
  Administered 2021-04-01: 81 mg via ORAL
  Filled 2021-04-01: qty 1

## 2021-04-01 MED ORDER — HEPARIN SODIUM (PORCINE) 1000 UNIT/ML IJ SOLN
INTRAMUSCULAR | Status: AC
  Filled 2021-04-01: qty 1

## 2021-04-01 MED ORDER — LABETALOL HCL 5 MG/ML IV SOLN: 10.0000 mg | INTRAVENOUS | Status: AC | PRN

## 2021-04-01 MED ORDER — FENTANYL CITRATE (PF) 100 MCG/2ML IJ SOLN
INTRAMUSCULAR | Status: DC | PRN
  Administered 2021-04-01: 25 ug via INTRAVENOUS

## 2021-04-01 MED ORDER — FUROSEMIDE 10 MG/ML IJ SOLN
40.0000 mg | Freq: Two times a day (BID) | INTRAMUSCULAR | Status: DC
  Administered 2021-04-02: 40 mg via INTRAVENOUS
  Filled 2021-04-01: qty 4

## 2021-04-01 MED ORDER — HEPARIN SODIUM (PORCINE) 1000 UNIT/ML IJ SOLN
INTRAMUSCULAR | Status: DC | PRN
  Administered 2021-04-01: 6000 [IU] via INTRAVENOUS

## 2021-04-01 SURGICAL SUPPLY — 13 items
CATH 5FR JL3.5 JR4 ANG PIG MP (CATHETERS) ×1 IMPLANT
CATH BALLN WEDGE 5F 110CM (CATHETERS) ×1 IMPLANT
DEVICE RAD COMP TR BAND LRG (VASCULAR PRODUCTS) ×1 IMPLANT
GLIDESHEATH SLEND SS 6F .021 (SHEATH) ×1 IMPLANT
GUIDEWIRE .025 260CM (WIRE) ×1 IMPLANT
GUIDEWIRE INQWIRE 1.5J.035X260 (WIRE) IMPLANT
INQWIRE 1.5J .035X260CM (WIRE) ×2
KIT HEART LEFT (KITS) ×2 IMPLANT
PACK CARDIAC CATHETERIZATION (CUSTOM PROCEDURE TRAY) ×2 IMPLANT
SHEATH GLIDE SLENDER 4/5FR (SHEATH) ×1 IMPLANT
SHEATH PROBE COVER 6X72 (BAG) ×1 IMPLANT
TRANSDUCER W/STOPCOCK (MISCELLANEOUS) ×2 IMPLANT
TUBING CIL FLEX 10 FLL-RA (TUBING) ×2 IMPLANT

## 2021-04-01 NOTE — Progress Notes (Signed)
Cath d/w Dr. Irish Lack - coronaries clean, but still with a lot of fluid on board requiring diuresis. Per his recommendation will order Lasix 80mg  IV today then 40mg  IV BID tomorrow.  Daily BMET ordered.

## 2021-04-01 NOTE — Social Work (Signed)
CSW received consult for substance use resources for patient.CSW spoke with patient at bedside. CSW offered patient outpatient substance use treatment services resources. Patient accepted.CSW will continue to follow. 

## 2021-04-01 NOTE — Interval H&P Note (Signed)
Cath Lab Visit (complete for each Cath Lab visit)  Clinical Evaluation Leading to the Procedure:   ACS: Yes.    Non-ACS:    Anginal Classification: CCS IV  Anti-ischemic medical therapy: Minimal Therapy (1 class of medications)  Non-Invasive Test Results: No non-invasive testing performed  Prior CABG: No previous CABG      History and Physical Interval Note:  04/01/2021 1:31 PM  Janet [name]  has presented today for surgery, with the diagnosis of new systolic CHF.  The various methods of treatment have been discussed with the patient and family. After consideration of risks, benefits and other options for treatment, the patient has consented to  Procedure(s): RIGHT/LEFT HEART CATH AND CORONARY ANGIOGRAPHY (N/A) as a surgical intervention.  The patient's history has been reviewed, patient examined, no change in status, stable for surgery.  I have reviewed the patient's chart and labs.  Questions were answered to the patient's satisfaction.     Larae Grooms

## 2021-04-01 NOTE — Progress Notes (Signed)
 Progress Note  Patient Name: Janet [name] Date of Encounter: 04/01/2021  [facility] Cardiologist: Dr Nahser  Subjective   No CP; dyspnea improved  Inpatient Medications    Scheduled Meds: . ARIPiprazole  10 mg Oral Daily  . aspirin  81 mg Oral Daily  . [START ON 04/02/2021] aspirin  81 mg Oral Pre-Cath  . enoxaparin (LOVENOX) injection  60 mg Subcutaneous Daily  . guaiFENesin  1,200 mg Oral BID  . hydrOXYzine  10 mg Oral BID  . ipratropium-albuterol  3 mL Nebulization Q6H  . methylPREDNISolone (SOLU-MEDROL) injection  60 mg Intravenous Q6H  . metoprolol tartrate  12.5 mg Oral BID  . montelukast  10 mg Oral QHS  . sacubitril-valsartan  1 tablet Oral BID  . sodium chloride flush  3 mL Intravenous Q12H  . traZODone  100 mg Oral QHS   Continuous Infusions: . sodium chloride    . [START ON 04/02/2021] sodium chloride    . azithromycin Stopped (03/31/21 2308)  . cefTRIAXone (ROCEPHIN)  IV Stopped (03/31/21 2138)   PRN Meds: sodium chloride, acetaminophen, albuterol, melatonin, ondansetron (ZOFRAN) IV, sodium chloride flush   Vital Signs    Vitals:   03/31/21 1713 03/31/21 1917 03/31/21 2053 04/01/21 0323  BP: 127/86  117/89   Pulse: 75  72   Resp: 18  20   Temp: 98.1 F (36.7 C)  98.2 F (36.8 C)   TempSrc: Oral  Oral   SpO2: 97% 100% 92% 96%  Weight:      Height:        Intake/Output Summary (Last 24 hours) at 04/01/2021 0822 Last data filed at 04/01/2021 0619 Gross per 24 hour  Intake 830 ml  Output 0 ml  Net 830 ml   Last 3 Weights 03/29/2021 03/21/2021 01/12/2021  Weight (lbs) 273 lb 273 lb 279 lb  Weight (kg) 123.832 kg 123.832 kg 126.554 kg      Telemetry    Sinus- Personally Reviewed  Physical Exam   GEN: No acute distress.   Neck: No JVD Cardiac: RRR, no murmurs, rubs, or gallops.  Respiratory: Mild expiratory wheeze GI: Soft, nontender, non-distended  MS: No edema Neuro:  Nonfocal  Psych: Normal affect   Labs    High Sensitivity  Troponin:   Recent Labs  Lab 03/29/21 2054 03/29/21 2250 03/30/21 0444 03/30/21 0800 03/31/21 0337  TROPONINIHS 30* 33* 17 17 30*      Chemistry Recent Labs  Lab 03/29/21 2054 03/30/21 0444 03/31/21 0337  NA 136 136 138  K 3.7 3.7 4.4  CL 105 107 103  CO2 25 21* 26  GLUCOSE 110* 216* 120*  BUN 12 13 16  CREATININE 1.05* 1.09* 1.09*  CALCIUM 8.7* 8.7* 9.6  GFRNONAA >60 >60 >60  ANIONGAP 6 8 9     Hematology Recent Labs  Lab 03/29/21 2054 03/30/21 0444  WBC 7.9 9.9  RBC 5.16* 5.18*  HGB 14.0 14.0  HCT 43.9 44.5  MCV 85.1 85.9  MCH 27.1 27.0  MCHC 31.9 31.5  RDW 15.0 15.1  PLT 214 203    BNP Recent Labs  Lab 03/29/21 2230  BNP 336.1*     Radiology    ECHOCARDIOGRAM LIMITED  Result Date: 03/30/2021    ECHOCARDIOGRAM LIMITED REPORT   Patient Name:   Janet [name] Date of Exam: 03/30/2021 Medical Rec #:  [id number]         Height:       68.0 in Accession #:    [id number]          Weight:       273.0 lb Date of Birth:  03/22/1966         BSA:          2.333 m Patient Age:    55 years          BP:           154/98 mmHg Patient Gender: F                 HR:           90 bpm. Exam Location:  Inpatient Procedure: 2D Echo, Cardiac Doppler and Color Doppler Indications:    Elevated Troponin  History:        Patient has prior history of Echocardiogram examinations, most                 recent 08/12/2020. Signs/Symptoms:Shortness of Breath. Coughing.  Sonographer:    Rachel Lane Referring Phys: [id number] CAROLE N HALL  Sonographer Comments: This was a limited echo for LV function IMPRESSIONS  1. Limited study for LV function; limited views and no doppler performed; global hypokinesis and akinesis of the inferolateral wall with overall moderate to severe LV dysfunction.  2. Left ventricular ejection fraction, by estimation, is 30 to 35%. The left ventricle has moderate to severely decreased function. The left ventricle demonstrates regional wall motion abnormalities (see scoring  diagram/findings for description). The left ventricular internal cavity size was moderately dilated. There is mild left ventricular hypertrophy.  3. Right ventricular systolic function is normal. The right ventricular size is normal.  4. Left atrial size was mildly dilated.  5. Aortic dilatation noted. There is mild dilatation of the aortic root, measuring 41 mm. FINDINGS  Left Ventricle: Left ventricular ejection fraction, by estimation, is 30 to 35%. The left ventricle has moderate to severely decreased function. The left ventricle demonstrates regional wall motion abnormalities. The left ventricular internal cavity size was moderately dilated. There is mild left ventricular hypertrophy. Right Ventricle: The right ventricular size is normal. Right ventricular systolic function is normal. Left Atrium: Left atrial size was mildly dilated. Right Atrium: Right atrial size was normal in size. Pericardium: There is no evidence of pericardial effusion. Aorta: Aortic dilatation noted. There is mild dilatation of the aortic root, measuring 41 mm. Additional Comments: Limited study for LV function; limited views and no doppler performed; global hypokinesis and akinesis of the inferolateral wall with overall moderate to severe LV dysfunction.  LV Volumes (MOD) LV vol d, MOD A2C: 164.0 ml LV vol d, MOD A4C: 166.0 ml LV vol s, MOD A2C: 103.0 ml LV vol s, MOD A4C: 98.7 ml LV SV MOD A2C:     61.0 ml LV SV MOD A4C:     166.0 ml LV SV MOD BP:      65.4 ml Shiquita Collignon MD Electronically signed by Akira Perusse MD Signature Date/Time: 03/30/2021/6:06:49 PM    Final     Patient Profile     55 y.o. female with past medical history of substance abuse, hypertension, depression, asthma admitted with asthma exacerbation, acute systolic congestive heart failure and found to have cardiomyopathy.  Echocardiogram showed ejection fraction 30 to 35%, global hypokinesis, akinesis of the inferior lateral wall, moderate left ventricular  enlargement, mild left ventricular hypertrophy, mild left atrial enlargement and mildly dilated aortic root.  Assessment & Plan    1. Cardiomyopathy-LV function moderate to severely reduced.  Plan is for cardiac catheterization to exclude coronary disease.  The risks and   benefits were previously discussed including myocardial infarction, CVA and death and she agreed to proceed.  Continue aspirin, Entresto and change metoprolol to Toprol (mild wheeze on exam but I think she will tolerate selective beta-blocker).  We will add low-dose Lasix, spironolactone and consider Jardiance following catheterization.   2. Acute systolic congestive heart failure-predominance of symptoms likely related to URI/asthma but she does describe worsening bilateral lower extremity edema and orthopnea. We will add diuretics post catheterization as outlined. 3. Minimal elevation in troponin-patient has not had chest pain consistent with ischemic etiology and there is no clear trend.  Presentation is not consistent with acute coronary syndrome.  Plan cardiac catheterization given history of cardiomyopathy. 4. Asthma/URI-continue antibiotics, steroids and bronchodilators per primary care. 5. Cocaine abuse-we discussed the importance of avoiding.   For questions or updates, please contact [facility] Please consult [email or web address] for contact info under        Signed, Kirk Ruths, MD  04/01/2021, 8:22 AM

## 2021-04-01 NOTE — Progress Notes (Signed)
PROGRESS NOTE    Janet [name]  XKG:[id number]  DOB: 1965/12/17  DOA: 03/29/2021 PCP: Camillia Herter, NP Outpatient Specialists:   Hospital course:  55 yo with polysubstance abuse, asthma, HTN, obesity and chronic depression presented 5/10 with progressive SOB. Work up revealed CT scan with possible RUL ground glass opacities suggestive of atypical infection and bronchial wall thickening c/w bronchitis. She was wheezing diffusely. Sars CoV 2 is negative. UDS positive for cocaine and marijuana. Patient was admitted for treatment of asthma exacerbation.   Echocardiogram with new onset cardiomyopathy with EF of 30 to 35% and regional wall motion abnormalities.  Cardiology was consulted  and she is going for cardiac catheterization later today.  Subjective: Patient was feeling improved from respiratory standpoint when seen today.  Able to weaned off from oxygen, speaking in full sentences with very few scattered wheeze. Waiting for cardiac catheterization later today.  Denies any pain  Objective: Vitals:   04/01/21 1433 04/01/21 1438 04/01/21 1443 04/01/21 1516  BP: 124/90 131/89 124/89 118/81  Pulse: 73 77 75 73  Resp: 20 13 (!) 74 18  Temp:    98.6 F (37 C)  TempSrc:    Oral  SpO2: 98% 98% 98% 93%  Weight:      Height:        Intake/Output Summary (Last 24 hours) at 04/01/2021 1610 Last data filed at 04/01/2021 7026 Gross per 24 hour  Intake 470 ml  Output 0 ml  Net 470 ml   Filed Weights   03/29/21 2226  Weight: 123.8 kg     Exam:  General.  Morbidly obese lady, in no acute distress. Pulmonary.  Few scattered wheezes bilaterally, normal respiratory effort. CV.  Regular rate and rhythm, no JVD, rub or murmur. Abdomen.  Soft, nontender, nondistended, BS positive. CNS.  Alert and oriented x3.  No focal neurologic deficit. Extremities.  Lymphedema bilaterally, no cyanosis, pulses intact and symmetrical. Psychiatry.  Judgment and insight appears  normal.  Assessment & Plan:   55 yo female was admitted 5/10 with asthma exacerbation and possible CAP.   Asthma exacerbation.  Started showing some improvement, now weaned off from oxygen. Continue with DuoNeb Continue albuterol nebulizer q 6h as needed Start tapering Solu-Medrol-we will decrease the dose to every 12 hourly. Continue with supportive care  CAP Patient with mild ground glass opacities on RUL possibly atypical pneumonia Covid is negative Continue treatment with Ceftriaxone and Azithromycin  Elevated troponin Peaked at 33, now down to 17 Patient denies CP but has clear SOB as above.  EKG has Tw inversions 2,3, F and laterally with minimal 1/2 mm st elevation septally.  Patient states she had cardiac cath 3 years ago and was told "my enzyme elevations were because of a panic attack and everything is fine".   CHF Echo done 2021 showed ef 46% with regional wall motion abnormalities as well as Grade 2 diastolic dysfunction.  This was attributed to her HTN per chart.  As noted above, she has h/o likely Takatsubo's  Repeat echo with EF of 30 to 35%.  Cardiology was consulted and they are recommending proceeding with cardiac catheterization later today.  HTN Cardiology is going to discontinue losartan and starting her on Entresto. Currently holding HCTZ-she will be started on Lasix and spironolactone after cath.  Substance Use Counseling was provided again.  Depression  Continue abilify and trazodone   DVT prophylaxis: lovenox Code Status: full Family Communication: Discussed with patient Disposition Plan:   Patient is from:  home  Anticipated Discharge Location: home   Barriers to Discharge:  none  Is patient medically stable for Discharge:  No, ongoing wheezing and cardiac w/u pending.    Consultants:  Cardiology  Procedures:  none  Antimicrobials:  Ceftriaxone  Azithromycin    Data Reviewed:  Basic Metabolic Panel: Recent Labs  Lab  03/29/21 2054 03/30/21 0444 03/31/21 0337 04/01/21 1415 04/01/21 1433 04/01/21 1434  NA 136 136 138 140 141 141  K 3.7 3.7 4.4 4.1 4.2 4.2  CL 105 107 103  --   --   --   CO2 25 21* 26  --   --   --   GLUCOSE 110* 216* 120*  --   --   --   BUN 12 13 16   --   --   --   CREATININE 1.05* 1.09* 1.09*  --   --   --   CALCIUM 8.7* 8.7* 9.6  --   --   --   MG  --  2.1  --   --   --   --   PHOS  --  2.6  --   --   --   --    Liver Function Tests: No results for input(s): AST, ALT, ALKPHOS, BILITOT, PROT, ALBUMIN in the last 168 hours. No results for input(s): LIPASE, AMYLASE in the last 168 hours. No results for input(s): AMMONIA in the last 168 hours. CBC: Recent Labs  Lab 03/29/21 2054 03/30/21 0444 04/01/21 1415 04/01/21 1433 04/01/21 1434  WBC 7.9 9.9  --   --   --   HGB 14.0 14.0 15.6* 15.6* 15.3*  HCT 43.9 44.5 46.0 46.0 45.0  MCV 85.1 85.9  --   --   --   PLT 214 203  --   --   --    Cardiac Enzymes: No results for input(s): CKTOTAL, CKMB, CKMBINDEX, TROPONINI in the last 168 hours. BNP (last 3 results) No results for input(s): PROBNP in the last 8760 hours. CBG: No results for input(s): GLUCAP in the last 168 hours.  Recent Results (from the past 240 hour(s))  Resp Panel by RT-PCR (Flu A&B, Covid) Nasopharyngeal Swab     Status: None   Collection Time: 03/29/21 11:00 PM   Specimen: Nasopharyngeal Swab; Nasopharyngeal(NP) swabs in vial transport medium  Result Value Ref Range Status   SARS Coronavirus 2 by RT PCR NEGATIVE NEGATIVE Final    Comment: (NOTE) SARS-CoV-2 target nucleic acids are NOT DETECTED.  The SARS-CoV-2 RNA is generally detectable in upper respiratory specimens during the acute phase of infection. The lowest concentration of SARS-CoV-2 viral copies this assay can detect is 138 copies/mL. A negative result does not preclude SARS-Cov-2 infection and should not be used as the sole basis for treatment or other patient management decisions. A  negative result may occur with  improper specimen collection/handling, submission of specimen other than nasopharyngeal swab, presence of viral mutation(s) within the areas targeted by this assay, and inadequate number of viral copies(<138 copies/mL). A negative result must be combined with clinical observations, patient history, and epidemiological information. The expected result is Negative.  Fact Sheet for Patients:  EntrepreneurPulse.com.au  Fact Sheet for Healthcare Providers:  IncredibleEmployment.be  This test is no t yet approved or cleared by the [place] FDA and  has been authorized for detection and/or diagnosis of SARS-CoV-2 by FDA under an Emergency Use Authorization (EUA). This EUA will remain  in effect (meaning this test can be used)  for the duration of the COVID-19 declaration under Section 564(b)(1) of the Act, 21 U.S.C.section 360bbb-3(b)(1), unless the authorization is terminated  or revoked sooner.       Influenza A by PCR NEGATIVE NEGATIVE Final   Influenza B by PCR NEGATIVE NEGATIVE Final    Comment: (NOTE) The Xpert Xpress SARS-CoV-2/FLU/RSV plus assay is intended as an aid in the diagnosis of influenza from Nasopharyngeal swab specimens and should not be used as a sole basis for treatment. Nasal washings and aspirates are unacceptable for Xpert Xpress SARS-CoV-2/FLU/RSV testing.  Fact Sheet for Patients: EntrepreneurPulse.com.au  Fact Sheet for Healthcare Providers: IncredibleEmployment.be  This test is not yet approved or cleared by the [place] FDA and has been authorized for detection and/or diagnosis of SARS-CoV-2 by FDA under an Emergency Use Authorization (EUA). This EUA will remain in effect (meaning this test can be used) for the duration of the COVID-19 declaration under Section 564(b)(1) of the Act, 21 U.S.C. section 360bbb-3(b)(1), unless the authorization  is terminated or revoked.  Performed at [facility], [place] [address], [place], [place] [address]       Studies: CARDIAC CATHETERIZATION  Result Date: [id number]  LV end diastolic pressure is moderately elevated.  There is no aortic valve stenosis.  Hemodynamic findings consistent with mild pulmonary hypertension.  Ao sat 99%, PA sat 73%, mean PA pressure 33 mmHg, mean PCWP 22 mmHg, cardiac output 6.2 L/min, cardiac index 2.6.  No angiographically apparent coronary artery disease. Significant tortuosity noted in the RCA and the circumflex.  Continue diuresis.  She will need aggressive medical therapy for nonischemic cardiomyopathy.   ECHOCARDIOGRAM LIMITED  Result Date: 03/30/2021    ECHOCARDIOGRAM LIMITED REPORT   Patient Name:   CORABEL LUBOW Date of Exam: 03/30/2021 Medical Rec #:  [id number]         Height:       68.0 in Accession #:    [id number]        Weight:       273.0 lb Date of Birth:  1966-08-06         BSA:          2.333 m Patient Age:    27 years          BP:           154/98 mmHg Patient Gender: F                 HR:           90 bpm. Exam Location:  Inpatient Procedure: 2D Echo, Cardiac Doppler and Color Doppler Indications:    Elevated Troponin  History:        Patient has prior history of Echocardiogram examinations, most                 recent 08/12/2020. Signs/Symptoms:Shortness of Breath. Coughing.  Sonographer:    Merrie Roof Referring Phys: [id number] Kayleen Memos  Sonographer Comments: This was a limited echo for LV function IMPRESSIONS  1. Limited study for LV function; limited views and no doppler performed; global hypokinesis and akinesis of the inferolateral wall with overall moderate to severe LV dysfunction.  2. Left ventricular ejection fraction, by estimation, is 30 to 35%. The left ventricle has moderate to severely decreased function. The left ventricle demonstrates regional wall motion abnormalities (see scoring diagram/findings for description). The left  ventricular internal cavity size was moderately dilated. There is mild left ventricular hypertrophy.  3. Right ventricular systolic function  is normal. The right ventricular size is normal.  4. Left atrial size was mildly dilated.  5. Aortic dilatation noted. There is mild dilatation of the aortic root, measuring 41 mm. FINDINGS  Left Ventricle: Left ventricular ejection fraction, by estimation, is 30 to 35%. The left ventricle has moderate to severely decreased function. The left ventricle demonstrates regional wall motion abnormalities. The left ventricular internal cavity size was moderately dilated. There is mild left ventricular hypertrophy. Right Ventricle: The right ventricular size is normal. Right ventricular systolic function is normal. Left Atrium: Left atrial size was mildly dilated. Right Atrium: Right atrial size was normal in size. Pericardium: There is no evidence of pericardial effusion. Aorta: Aortic dilatation noted. There is mild dilatation of the aortic root, measuring 41 mm. Additional Comments: Limited study for LV function; limited views and no doppler performed; global hypokinesis and akinesis of the inferolateral wall with overall moderate to severe LV dysfunction.  LV Volumes (MOD) LV vol d, MOD A2C: 164.0 ml LV vol d, MOD A4C: 166.0 ml LV vol s, MOD A2C: 103.0 ml LV vol s, MOD A4C: 98.7 ml LV SV MOD A2C:     61.0 ml LV SV MOD A4C:     166.0 ml LV SV MOD BP:      65.4 ml Kirk Ruths MD Electronically signed by Kirk Ruths MD Signature Date/Time: 03/30/2021/6:06:49 PM    Final      Scheduled Meds: . ARIPiprazole  10 mg Oral Daily  . aspirin  81 mg Oral Daily  . enoxaparin (LOVENOX) injection  60 mg Subcutaneous Daily  . furosemide  80 mg Intravenous Once   Followed by  . [START ON 04/02/2021] furosemide  40 mg Intravenous BID  . guaiFENesin  1,200 mg Oral BID  . hydrOXYzine  10 mg Oral BID  . ipratropium-albuterol  3 mL Nebulization Q6H  . methylPREDNISolone (SOLU-MEDROL)  injection  60 mg Intravenous Q6H  . metoprolol succinate  25 mg Oral Daily  . montelukast  10 mg Oral QHS  . sacubitril-valsartan  1 tablet Oral BID  . sodium chloride flush  3 mL Intravenous Q12H  . sodium chloride flush  3 mL Intravenous Q12H  . traZODone  100 mg Oral QHS   Continuous Infusions: . sodium chloride    . azithromycin Stopped (03/31/21 2308)  . cefTRIAXone (ROCEPHIN)  IV Stopped (03/31/21 2138)    Active Problems:   Asthma exacerbation   Acute respiratory failure with hypoxia (HCC)   Acute systolic heart failure (HCC)  Lorella Nimrod, MD [facility]  This record has been created using Systems analyst. Errors have been sought and corrected,but may not always be located. Such creation errors do not reflect on the standard of care.  If 7PM-7AM, please contact night-coverage [email or web address]   LOS: 2 days

## 2021-04-01 NOTE — Plan of Care (Signed)
  Problem: Clinical Measurements: Goal: Ability to maintain clinical measurements within normal limits will improve Outcome: Progressing   Problem: Activity: Goal: Risk for activity intolerance will decrease Outcome: Progressing   Problem: Coping: Goal: Level of anxiety will decrease Outcome: Progressing   

## 2021-04-01 NOTE — H&P (View-Only) (Signed)
Progress Note  Patient Name: Janet [name] Date of Encounter: 04/01/2021  [facility] Cardiologist: Dr Acie Fredrickson  Subjective   No CP; dyspnea improved  Inpatient Medications    Scheduled Meds: . ARIPiprazole  10 mg Oral Daily  . aspirin  81 mg Oral Daily  . [START ON 04/02/2021] aspirin  81 mg Oral Pre-Cath  . enoxaparin (LOVENOX) injection  60 mg Subcutaneous Daily  . guaiFENesin  1,200 mg Oral BID  . hydrOXYzine  10 mg Oral BID  . ipratropium-albuterol  3 mL Nebulization Q6H  . methylPREDNISolone (SOLU-MEDROL) injection  60 mg Intravenous Q6H  . metoprolol tartrate  12.5 mg Oral BID  . montelukast  10 mg Oral QHS  . sacubitril-valsartan  1 tablet Oral BID  . sodium chloride flush  3 mL Intravenous Q12H  . traZODone  100 mg Oral QHS   Continuous Infusions: . sodium chloride    . [START ON 04/02/2021] sodium chloride    . azithromycin Stopped (03/31/21 2308)  . cefTRIAXone (ROCEPHIN)  IV Stopped (03/31/21 2138)   PRN Meds: sodium chloride, acetaminophen, albuterol, melatonin, ondansetron (ZOFRAN) IV, sodium chloride flush   Vital Signs    Vitals:   03/31/21 1713 03/31/21 1917 03/31/21 2053 04/01/21 0323  BP: 127/86  117/89   Pulse: 75  72   Resp: 18  20   Temp: 98.1 F (36.7 C)  98.2 F (36.8 C)   TempSrc: Oral  Oral   SpO2: 97% 100% 92% 96%  Weight:      Height:        Intake/Output Summary (Last 24 hours) at 04/01/2021 0822 Last data filed at 04/01/2021 0630 Gross per 24 hour  Intake 830 ml  Output 0 ml  Net 830 ml   Last 3 Weights 03/29/2021 03/21/2021 01/12/2021  Weight (lbs) 273 lb 273 lb 279 lb  Weight (kg) 123.832 kg 123.832 kg 126.554 kg      Telemetry    Sinus- Personally Reviewed  Physical Exam   GEN: No acute distress.   Neck: No JVD Cardiac: RRR, no murmurs, rubs, or gallops.  Respiratory: Mild expiratory wheeze GI: Soft, nontender, non-distended  MS: No edema Neuro:  Nonfocal  Psych: Normal affect   Labs    High Sensitivity  Troponin:   Recent Labs  Lab 03/29/21 2054 03/29/21 2250 03/30/21 0444 03/30/21 0800 03/31/21 0337  TROPONINIHS 30* 33* 17 17 30*      Chemistry Recent Labs  Lab 03/29/21 2054 03/30/21 0444 03/31/21 0337  NA 136 136 138  K 3.7 3.7 4.4  CL 105 107 103  CO2 25 21* 26  GLUCOSE 110* 216* 120*  BUN 12 13 16   CREATININE 1.05* 1.09* 1.09*  CALCIUM 8.7* 8.7* 9.6  GFRNONAA >60 >60 >60  ANIONGAP 6 8 9      Hematology Recent Labs  Lab 03/29/21 2054 03/30/21 0444  WBC 7.9 9.9  RBC 5.16* 5.18*  HGB 14.0 14.0  HCT 43.9 44.5  MCV 85.1 85.9  MCH 27.1 27.0  MCHC 31.9 31.5  RDW 15.0 15.1  PLT 214 203    BNP Recent Labs  Lab 03/29/21 2230  BNP 336.1*     Radiology    ECHOCARDIOGRAM LIMITED  Result Date: 03/30/2021    ECHOCARDIOGRAM LIMITED REPORT   Patient Name:   Janet [name] Date of Exam: 03/30/2021 Medical Rec #:  [id number]         Height:       68.0 in Accession #:    [id number]  Weight:       273.0 lb Date of Birth:  05-15-1966         BSA:          2.333 m Patient Age:    27 years          BP:           154/98 mmHg Patient Gender: F                 HR:           90 bpm. Exam Location:  Inpatient Procedure: 2D Echo, Cardiac Doppler and Color Doppler Indications:    Elevated Troponin  History:        Patient has prior history of Echocardiogram examinations, most                 recent 08/12/2020. Signs/Symptoms:Shortness of Breath. Coughing.  Sonographer:    Merrie Roof Referring Phys: [id number] Kayleen Memos  Sonographer Comments: This was a limited echo for LV function IMPRESSIONS  1. Limited study for LV function; limited views and no doppler performed; global hypokinesis and akinesis of the inferolateral wall with overall moderate to severe LV dysfunction.  2. Left ventricular ejection fraction, by estimation, is 30 to 35%. The left ventricle has moderate to severely decreased function. The left ventricle demonstrates regional wall motion abnormalities (see scoring  diagram/findings for description). The left ventricular internal cavity size was moderately dilated. There is mild left ventricular hypertrophy.  3. Right ventricular systolic function is normal. The right ventricular size is normal.  4. Left atrial size was mildly dilated.  5. Aortic dilatation noted. There is mild dilatation of the aortic root, measuring 41 mm. FINDINGS  Left Ventricle: Left ventricular ejection fraction, by estimation, is 30 to 35%. The left ventricle has moderate to severely decreased function. The left ventricle demonstrates regional wall motion abnormalities. The left ventricular internal cavity size was moderately dilated. There is mild left ventricular hypertrophy. Right Ventricle: The right ventricular size is normal. Right ventricular systolic function is normal. Left Atrium: Left atrial size was mildly dilated. Right Atrium: Right atrial size was normal in size. Pericardium: There is no evidence of pericardial effusion. Aorta: Aortic dilatation noted. There is mild dilatation of the aortic root, measuring 41 mm. Additional Comments: Limited study for LV function; limited views and no doppler performed; global hypokinesis and akinesis of the inferolateral wall with overall moderate to severe LV dysfunction.  LV Volumes (MOD) LV vol d, MOD A2C: 164.0 ml LV vol d, MOD A4C: 166.0 ml LV vol s, MOD A2C: 103.0 ml LV vol s, MOD A4C: 98.7 ml LV SV MOD A2C:     61.0 ml LV SV MOD A4C:     166.0 ml LV SV MOD BP:      65.4 ml Kirk Ruths MD Electronically signed by Kirk Ruths MD Signature Date/Time: 03/30/2021/6:06:49 PM    Final     Patient Profile     55 y.o. female with past medical history of substance abuse, hypertension, depression, asthma admitted with asthma exacerbation, acute systolic congestive heart failure and found to have cardiomyopathy.  Echocardiogram showed ejection fraction 30 to 35%, global hypokinesis, akinesis of the inferior lateral wall, moderate left ventricular  enlargement, mild left ventricular hypertrophy, mild left atrial enlargement and mildly dilated aortic root.  Assessment & Plan    1. Cardiomyopathy-LV function moderate to severely reduced.  Plan is for cardiac catheterization to exclude coronary disease.  The risks and  benefits were previously discussed including myocardial infarction, CVA and death and she agreed to proceed.  Continue aspirin, Entresto and change metoprolol to Toprol (mild wheeze on exam but I think she will tolerate selective beta-blocker).  We will add low-dose Lasix, spironolactone and consider Jardiance following catheterization.   2. Acute systolic congestive heart failure-predominance of symptoms likely related to URI/asthma but she does describe worsening bilateral lower extremity edema and orthopnea. We will add diuretics post catheterization as outlined. 3. Minimal elevation in troponin-patient has not had chest pain consistent with ischemic etiology and there is no clear trend.  Presentation is not consistent with acute coronary syndrome.  Plan cardiac catheterization given history of cardiomyopathy. 4. Asthma/URI-continue antibiotics, steroids and bronchodilators per primary care. 5. Cocaine abuse-we discussed the importance of avoiding.   For questions or updates, please contact [place] Please consult [email or web address] for contact info under        Signed, Kirk Ruths, MD  04/01/2021, 8:22 AM

## 2021-04-02 LAB — BASIC METABOLIC PANEL
Anion gap: 8 (ref 5–15)
BUN: 23 mg/dL — ABNORMAL HIGH (ref 6–20)
CO2: 28 mmol/L (ref 22–32)
Calcium: 8.9 mg/dL (ref 8.9–10.3)
Chloride: 101 mmol/L (ref 98–111)
Creatinine, Ser: 1.29 mg/dL — ABNORMAL HIGH (ref 0.44–1.00)
GFR, Estimated: 50 mL/min — ABNORMAL LOW (ref >60)
Glucose, Bld: 117 mg/dL — ABNORMAL HIGH (ref 70–99)
Potassium: 3.8 mmol/L (ref 3.5–5.1)
Sodium: 137 mmol/L (ref 135–145)

## 2021-04-02 MED ORDER — PREDNISONE 20 MG PO TABS
40.0000 mg | ORAL_TABLET | Freq: Every day | ORAL | Status: DC
  Administered 2021-04-03: 40 mg via ORAL
  Filled 2021-04-02: qty 2

## 2021-04-02 MED ORDER — POTASSIUM CHLORIDE CRYS ER 20 MEQ PO TBCR
40.0000 meq | EXTENDED_RELEASE_TABLET | Freq: Once | ORAL | Status: AC
  Administered 2021-04-02: 40 meq via ORAL
  Filled 2021-04-02: qty 2

## 2021-04-02 MED ORDER — FUROSEMIDE 10 MG/ML IJ SOLN
40.0000 mg | Freq: Every day | INTRAMUSCULAR | Status: DC
  Administered 2021-04-03: 40 mg via INTRAVENOUS
  Filled 2021-04-02: qty 4

## 2021-04-02 MED ORDER — SPIRONOLACTONE 12.5 MG HALF TABLET
12.5000 mg | ORAL_TABLET | Freq: Every day | ORAL | Status: DC
  Administered 2021-04-02 – 2021-04-03 (×2): 12.5 mg via ORAL
  Filled 2021-04-02 (×2): qty 1

## 2021-04-02 NOTE — Plan of Care (Signed)
  Problem: Clinical Measurements: Goal: Ability to maintain clinical measurements within normal limits will improve Outcome: Progressing Goal: Will remain free from infection Outcome: Progressing Goal: Respiratory complications will improve Outcome: Progressing Goal: Cardiovascular complication will be avoided Outcome: Progressing   Problem: Activity: Goal: Risk for activity intolerance will decrease Outcome: Progressing   Problem: Nutrition: Goal: Adequate nutrition will be maintained Outcome: Progressing   Problem: Coping: Goal: Level of anxiety will decrease Outcome: Progressing   Problem: Elimination: Goal: Will not experience complications related to bowel motility Outcome: Progressing Goal: Will not experience complications related to urinary retention Outcome: Progressing   Problem: Pain Managment: Goal: General experience of comfort will improve Outcome: Progressing   Problem: Safety: Goal: Ability to remain free from injury will improve Outcome: Progressing   Problem: Skin Integrity: Goal: Risk for impaired skin integrity will decrease Outcome: Progressing   

## 2021-04-02 NOTE — Progress Notes (Signed)
PROGRESS NOTE    Janet [name]  LGX:[id number] DOB: Apr 14, 1966 DOA: 03/29/2021 PCP: Camillia Herter, NP    Brief Narrative:  Janet [name] is a 55 year old female with past medical history significant for asthma, essential hypertension, depression, morbid obesity, polysubstance abuse who presented to [facility] on 5/10 with progressive shortness of breath. Symptoms associated with wheezing and productive cough with yellow sputum production.  Symptoms are worse with exertion, no alleviating factors.  Endorses continued use of marijuana/cocaine.  In the ED, patient was afebrile with BP 127/87, HR 106, RR 18, SPO2 95% on 2.5 L nasal cannula.  BNP elevated 336.  Troponin 30>33.  EKG was abnormal and was sent to [facility] for further evaluation and management under hospitalist service for acute asthma exacerbation and abnormal EKG.    Assessment & Plan:   Active Problems:   Asthma exacerbation   Acute respiratory failure with hypoxia (HCC)   Acute systolic heart failure ([place])   Acute asthma exacerbation Patient initially presented to urgent care with progressive shortness of breath over the past 2-3 days, following marijuana use.  Associated with productive cough with yellow sputum.  Initially requiring Soloxine on admission, now titrated off.  Discussed marijuana cessation. --Singulair 10 mg p.o. nightly --Transition Solu-Medrol to prednisone 40 mg p.o. daily tomorrow --DuoNebs/albuterol neb as needed  Community-acquired pneumonia Patient is afebrile without leukocytosis.  CT angiogram chest negative for PE but with diffuse thickening bronchial wall and groundglass nodule right upper lobe consistent with atypical infection.  COVID-19 PCR/influenza A/B PCR negative. --Ceftriaxone 1g IV q24h and  Azithromycin 500 mg IV q24h, plan 5-day course  Systolic congestive heart failure, new diagnosis Nonischemic cardiomyopathy Essential HTN On arrival to urgent care,  EKG notable for T wave inversions lead II, 3, aVF.  Troponin peaked at 33.  BNP elevated at 336.  TEE 5/11 with LVEF 30-35%, moderate/severely decreased LV function and regional wall motion abnormalities, LV moderately dilated, LA mildly dilated, mild dilation aortic root. Left heart catheterization 5/13 with no findings of CAD. -- Metoprolol succinate 25 mg p.o. daily --Entresto 24-26 mg p.o. twice daily --Spironolactone 12.5 mg p.o. daily --Furosemide 40 mg IV daily --Strict I's and O's and daily weights --BMP in a.m.  Depression -- Abilify 10 mg p.o. daily -- Trazodone 100 mg p.o. nightly  Polysubstance abuse Patient continues to endorse marijuana and crack cocaine use.  UDS positive for THC and cocaine.  Patient counseled on need for complete cessation given her underlying asthma as well as new congestive heart failure.  Patient was seen by social work and offered outpatient substance use treatment services in which she accepted.  DVT prophylaxis: SCDs Start: 03/30/21 0439    Code Status: Full Code Family Communication: none present at bedside  Disposition Plan:  Level of care: Telemetry Medical Status is: Inpatient  Remains inpatient appropriate because:Unsafe d/c plan, IV treatments appropriate due to intensity of illness or inability to take PO and Inpatient level of care appropriate due to severity of illness   Dispo:  Patient From: Home  Planned Disposition: Home  Medically stable for discharge: No     Consultants:   Cardiology  Procedures:   TTE 5/11  Left heart catheterization 5/13  Antimicrobials:   Azithromycin 5/11>>  Ceftriaxonn 5/11>>   Subjective: Patient seen and examined bedside, resting comfortably.  Continues off of supple oxygen.  Dyspnea much improved.  Cardiology reducing IV Lasix today with plan to transition to oral tomorrow.  No other complaints  or concerns at this time.  Denies headache, no visual changes, no chest pain, no palpitations,  no shortness of breath, no abdominal pain, no weakness, no fatigue, no paresthesias.  No acute events overnight per nursing staff.  Objective: Vitals:   04/01/21 2357 04/02/21 0253 04/02/21 0418 04/02/21 0900  BP: 129/88  (!) 117/93   Pulse: 79 67    Resp: 16 11 18    Temp: 98.8 F (37.1 C)  98.5 F (36.9 C)   TempSrc: Oral  Oral   SpO2: 94% 100%  99%  Weight:   125.6 kg   Height:        Intake/Output Summary (Last 24 hours) at 04/02/2021 1249 Last data filed at 04/02/2021 0809 Gross per 24 hour  Intake 387.31 ml  Output --  Net 387.31 ml   Filed Weights   03/29/21 2226 04/02/21 0418  Weight: 123.8 kg 125.6 kg    Examination:  General exam: Appears calm and comfortable, obese Respiratory system: Mid/Late expiratory wheezing bilaterally, normal [place] effort, no crackles/rales, on room air Cardiovascular system: S1 & S2 heard, RRR. No JVD, murmurs, rubs, gallops or clicks. No pedal edema. Gastrointestinal system: Abdomen is nondistended, soft and nontender. No organomegaly or masses felt. Normal bowel sounds heard. Central nervous system: Alert and oriented. No focal neurological deficits. Extremities: Symmetric 5 x 5 power. Skin: No rashes, lesions or ulcers Psychiatry: Judgement and insight appear normal. Mood & affect appropriate.     Data Reviewed: I have personally reviewed following labs and imaging studies  CBC: Recent Labs  Lab 03/29/21 2054 03/30/21 0444 04/01/21 1415 04/01/21 1433 04/01/21 1434  WBC 7.9 9.9  --   --   --   HGB 14.0 14.0 15.6* 15.6* 15.3*  HCT 43.9 44.5 46.0 46.0 45.0  MCV 85.1 85.9  --   --   --   PLT 214 203  --   --   --    Basic Metabolic Panel: Recent Labs  Lab 03/29/21 2054 03/30/21 0444 03/31/21 0337 04/01/21 1415 04/01/21 1433 04/01/21 1434 04/02/21 0154  NA 136 136 138 140 141 141 137  K 3.7 3.7 4.4 4.1 4.2 4.2 3.8  CL 105 107 103  --   --   --  101  CO2 25 21* 26  --   --   --  28  GLUCOSE 110* 216* 120*  --    --   --  117*  BUN 12 13 16   --   --   --  23*  CREATININE 1.05* 1.09* 1.09*  --   --   --  1.29*  CALCIUM 8.7* 8.7* 9.6  --   --   --  8.9  MG  --  2.1  --   --   --   --   --   PHOS  --  2.6  --   --   --   --   --    GFR: Estimated Creatinine Clearance: 70.5 mL/min (A) (by C-G formula based on SCr of 1.29 mg/dL (H)). Liver Function Tests: No results for input(s): AST, ALT, ALKPHOS, BILITOT, PROT, ALBUMIN in the last 168 hours. No results for input(s): LIPASE, AMYLASE in the last 168 hours. No results for input(s): AMMONIA in the last 168 hours. Coagulation Profile: No results for input(s): INR, PROTIME in the last 168 hours. Cardiac Enzymes: No results for input(s): CKTOTAL, CKMB, CKMBINDEX, TROPONINI in the last 168 hours. BNP (last 3 results) No results for input(s): PROBNP  in the last 8760 hours. HbA1C: No results for input(s): HGBA1C in the last 72 hours. CBG: No results for input(s): GLUCAP in the last 168 hours. Lipid Profile: No results for input(s): CHOL, HDL, LDLCALC, TRIG, CHOLHDL, LDLDIRECT in the last 72 hours. Thyroid Function Tests: No results for input(s): TSH, T4TOTAL, FREET4, T3FREE, THYROIDAB in the last 72 hours. Anemia Panel: No results for input(s): VITAMINB12, FOLATE, FERRITIN, TIBC, IRON, RETICCTPCT in the last 72 hours. Sepsis Labs: No results for input(s): PROCALCITON, LATICACIDVEN in the last 168 hours.  Recent Results (from the past 240 hour(s))  Resp Panel by RT-PCR (Flu A&B, Covid) Nasopharyngeal Swab     Status: None   Collection Time: 03/29/21 11:00 PM   Specimen: Nasopharyngeal Swab; Nasopharyngeal(NP) swabs in vial transport medium  Result Value Ref Range Status   SARS Coronavirus 2 by RT PCR NEGATIVE NEGATIVE Final    Comment: (NOTE) SARS-CoV-2 target nucleic acids are NOT DETECTED.  The SARS-CoV-2 RNA is generally detectable in upper respiratory specimens during the acute phase of infection. The lowest concentration of SARS-CoV-2 viral  copies this assay can detect is 138 copies/mL. A negative result does not preclude SARS-Cov-2 infection and should not be used as the sole basis for treatment or other patient management decisions. A negative result may occur with  improper specimen collection/handling, submission of specimen other than nasopharyngeal swab, presence of viral mutation(s) within the areas targeted by this assay, and inadequate number of viral copies(<138 copies/mL). A negative result must be combined with clinical observations, patient history, and epidemiological information. The expected result is Negative.  Fact Sheet for Patients:  EntrepreneurPulse.com.au  Fact Sheet for Healthcare Providers:  IncredibleEmployment.be  This test is no t yet approved or cleared by the [place] FDA and  has been authorized for detection and/or diagnosis of SARS-CoV-2 by FDA under an Emergency Use Authorization (EUA). This EUA will remain  in effect (meaning this test can be used) for the duration of the COVID-19 declaration under Section 564(b)(1) of the Act, 21 U.S.C.section 360bbb-3(b)(1), unless the authorization is terminated  or revoked sooner.       Influenza A by PCR NEGATIVE NEGATIVE Final   Influenza B by PCR NEGATIVE NEGATIVE Final    Comment: (NOTE) The Xpert Xpress SARS-CoV-2/FLU/RSV plus assay is intended as an aid in the diagnosis of influenza from Nasopharyngeal swab specimens and should not be used as a sole basis for treatment. Nasal washings and aspirates are unacceptable for Xpert Xpress SARS-CoV-2/FLU/RSV testing.  Fact Sheet for Patients: EntrepreneurPulse.com.au  Fact Sheet for Healthcare Providers: IncredibleEmployment.be  This test is not yet approved or cleared by the [place] FDA and has been authorized for detection and/or diagnosis of SARS-CoV-2 by FDA under an Emergency Use Authorization (EUA). This  EUA will remain in effect (meaning this test can be used) for the duration of the COVID-19 declaration under Section 564(b)(1) of the Act, 21 U.S.C. section 360bbb-3(b)(1), unless the authorization is terminated or revoked.  Performed at [facility], [place] [address], [place], [place] [address]          Radiology Studies: CARDIAC CATHETERIZATION  Result Date: 0/86/5784  LV end diastolic pressure is moderately elevated.  There is no aortic valve stenosis.  Hemodynamic findings consistent with mild pulmonary hypertension.  Ao sat 99%, PA sat 73%, mean PA pressure 33 mmHg, mean PCWP 22 mmHg, cardiac output 6.2 L/min, cardiac index 2.6.  No angiographically apparent coronary artery disease. Significant tortuosity noted in the RCA  and the circumflex.  Continue diuresis.  She will need aggressive medical therapy for nonischemic cardiomyopathy.        Scheduled Meds: . ARIPiprazole  10 mg Oral Daily  . aspirin  81 mg Oral Daily  . enoxaparin (LOVENOX) injection  60 mg Subcutaneous Daily  . [START ON 04/03/2021] furosemide  40 mg Intravenous Daily  . hydrOXYzine  10 mg Oral BID  . ipratropium-albuterol  3 mL Nebulization Q6H  . metoprolol succinate  25 mg Oral Daily  . montelukast  10 mg Oral QHS  . [START ON 04/03/2021] predniSONE  40 mg Oral Q breakfast  . sacubitril-valsartan  1 tablet Oral BID  . sodium chloride flush  3 mL Intravenous Q12H  . sodium chloride flush  3 mL Intravenous Q12H  . spironolactone  12.5 mg Oral Daily  . traZODone  100 mg Oral QHS   Continuous Infusions: . sodium chloride Stopped (04/02/21 0015)  . azithromycin Stopped (04/01/21 2355)  . cefTRIAXone (ROCEPHIN)  IV Stopped (04/01/21 2154)     LOS: 3 days    Time spent: 39 minutes spent on chart review, discussion with nursing staff, consultants, updating family and interview/physical exam; more than 50% of that time was spent in counseling and/or coordination of care.    Shakeira Rhee J  [place] DO [facility] Available via Epic secure chat 7am-7pm After these hours, please refer to coverage provider listed on [email or web address] 04/02/2021, 12:49 PM

## 2021-04-02 NOTE — Care Management (Addendum)
1254 04-02-21 Case Manager provided patient with the 30 day free Entresto and co pay card. Unable to do a co pay for medications over the weekend. No further needs from Case Manager at this time. Bethena Roys, RN,BSN Case Manager   [phone] 04-02-21 Case Manager called [facility] on [address] and Delene Loll is available. Graves-Bigelow, Ocie Cornfield, RN, BSN Case Manager

## 2021-04-02 NOTE — Progress Notes (Addendum)
Progress Note  Patient Name: Janet [name] Date of Encounter: 04/02/2021  [facility] Cardiologist: Dr Acie Fredrickson  Subjective   No CP; mild dyspnea  Inpatient Medications    Scheduled Meds: . ARIPiprazole  10 mg Oral Daily  . aspirin  81 mg Oral Daily  . enoxaparin (LOVENOX) injection  60 mg Subcutaneous Daily  . furosemide  40 mg Intravenous BID  . hydrOXYzine  10 mg Oral BID  . ipratropium-albuterol  3 mL Nebulization Q6H  . methylPREDNISolone (SOLU-MEDROL) injection  60 mg Intravenous Q12H  . metoprolol succinate  25 mg Oral Daily  . montelukast  10 mg Oral QHS  . sacubitril-valsartan  1 tablet Oral BID  . sodium chloride flush  3 mL Intravenous Q12H  . sodium chloride flush  3 mL Intravenous Q12H  . traZODone  100 mg Oral QHS   Continuous Infusions: . sodium chloride Stopped (04/02/21 0015)  . azithromycin Stopped (04/01/21 2355)  . cefTRIAXone (ROCEPHIN)  IV Stopped (04/01/21 2154)   PRN Meds: sodium chloride, acetaminophen, albuterol, melatonin, ondansetron (ZOFRAN) IV, sodium chloride flush   Vital Signs    Vitals:   04/01/21 2357 04/02/21 0253 04/02/21 0418 04/02/21 0900  BP: 129/88  (!) 117/93   Pulse: 79 67    Resp: 16 11 18    Temp: 98.8 F (37.1 C)  98.5 F (36.9 C)   TempSrc: Oral  Oral   SpO2: 94% 100%  99%  Weight:   125.6 kg   Height:        Intake/Output Summary (Last 24 hours) at 04/02/2021 0933 Last data filed at 04/02/2021 0809 Gross per 24 hour  Intake 387.31 ml  Output --  Net 387.31 ml   Last 3 Weights 04/02/2021 03/29/2021 03/21/2021  Weight (lbs) 276 lb 12.8 oz 273 lb 273 lb  Weight (kg) 125.556 kg 123.832 kg 123.832 kg      Telemetry    Sinus with PVCs and NSVT- Personally Reviewed  Physical Exam   GEN: No acute distress.  WD Neck: supple Cardiac: RRR Respiratory: Mild expiratory wheeze; no rhonchi GI: Soft, NT MS: No edema Neuro:  Grossly intact Psych: Normal affect   Labs    High Sensitivity Troponin:    Recent Labs  Lab 03/29/21 2054 03/29/21 2250 03/30/21 0444 03/30/21 0800 03/31/21 0337  TROPONINIHS 30* 33* 17 17 30*      Chemistry Recent Labs  Lab 03/30/21 0444 03/31/21 0337 04/01/21 1415 04/01/21 1433 04/01/21 1434 04/02/21 0154  NA 136 138   < > 141 141 137  K 3.7 4.4   < > 4.2 4.2 3.8  CL 107 103  --   --   --  101  CO2 21* 26  --   --   --  28  GLUCOSE 216* 120*  --   --   --  117*  BUN 13 16  --   --   --  23*  CREATININE 1.09* 1.09*  --   --   --  1.29*  CALCIUM 8.7* 9.6  --   --   --  8.9  GFRNONAA >60 >60  --   --   --  50*  ANIONGAP 8 9  --   --   --  8   < > = values in this interval not displayed.     Hematology Recent Labs  Lab 03/29/21 2054 03/30/21 0444 04/01/21 1415 04/01/21 1433 04/01/21 1434  WBC 7.9 9.9  --   --   --  RBC 5.16* 5.18*  --   --   --   HGB 14.0 14.0 15.6* 15.6* 15.3*  HCT 43.9 44.5 46.0 46.0 45.0  MCV 85.1 85.9  --   --   --   MCH 27.1 27.0  --   --   --   MCHC 31.9 31.5  --   --   --   RDW 15.0 15.1  --   --   --   PLT 214 203  --   --   --     BNP Recent Labs  Lab 03/29/21 2230  BNP 336.1*     Radiology    CARDIAC CATHETERIZATION  Result Date: 8/56/3149  LV end diastolic pressure is moderately elevated.  There is no aortic valve stenosis.  Hemodynamic findings consistent with mild pulmonary hypertension.  Ao sat 99%, PA sat 73%, mean PA pressure 33 mmHg, mean PCWP 22 mmHg, cardiac output 6.2 L/min, cardiac index 2.6.  No angiographically apparent coronary artery disease. Significant tortuosity noted in the RCA and the circumflex.  Continue diuresis.  She will need aggressive medical therapy for nonischemic cardiomyopathy.    Patient Profile     55 y.o. female with past medical history of substance abuse, hypertension, depression, asthma admitted with asthma exacerbation, acute systolic congestive heart failure and found to have cardiomyopathy.  Echocardiogram showed ejection fraction 30 to 35%, global  hypokinesis, akinesis of the inferior lateral wall, moderate left ventricular enlargement, mild left ventricular hypertrophy, mild left atrial enlargement and mildly dilated aortic root.  Assessment & Plan    1. Cardiomyopathy-LV function moderate to severely reduced. Cath reveals no coronary disease.  Etiology is therefore nonischemic.  We will continue Entresto and Toprol.  Add spironolactone 12.5 mg daily.  2. Acute systolic congestive heart failure-pulmonary capillary wedge pressure elevated at time of catheterization yesterday.  We will decrease Lasix to 40 milligrams daily and transition to oral Lasix tomorrow (likely 40 mg daily).  3. Minimal elevation in troponin-presentation is not consistent with acute coronary syndrome.  Catheterization reveals no coronary disease. 4. Asthma/URI-continue antibiotics, steroids and bronchodilators per primary care. 5. Cocaine abuse-we discussed the importance of avoiding.  Probable discharge tomorrow morning.   For questions or updates, please contact [place] Please consult [email or web address] for contact info under        Signed, Kirk Ruths, MD  04/02/2021, 9:33 AM

## 2021-04-02 NOTE — Progress Notes (Signed)
According to centralized telemetry patient had 5 beats of V-Tach, patient states that she did fell a little shortness of breath.  Patient is alert and oriented times four at this time.  No other symptoms noted.  Will continue to monitor.    Donah Driver, RN

## 2021-04-03 ENCOUNTER — Other Ambulatory Visit: Payer: Self-pay | Admitting: Physician Assistant

## 2021-04-03 DIAGNOSIS — Z79899 Other long term (current) drug therapy: Secondary | ICD-10-CM

## 2021-04-03 DIAGNOSIS — J4521 Mild intermittent asthma with (acute) exacerbation: Secondary | ICD-10-CM

## 2021-04-03 LAB — BASIC METABOLIC PANEL
Anion gap: 6 (ref 5–15)
BUN: 17 mg/dL (ref 6–20)
CO2: 28 mmol/L (ref 22–32)
Calcium: 8.5 mg/dL — ABNORMAL LOW (ref 8.9–10.3)
Chloride: 104 mmol/L (ref 98–111)
Creatinine, Ser: 1.14 mg/dL — ABNORMAL HIGH (ref 0.44–1.00)
GFR, Estimated: 58 mL/min — ABNORMAL LOW (ref >60)
Glucose, Bld: 94 mg/dL (ref 70–99)
Potassium: 3.9 mmol/L (ref 3.5–5.1)
Sodium: 138 mmol/L (ref 135–145)

## 2021-04-03 MED ORDER — METOPROLOL SUCCINATE ER 25 MG PO TB24
25.0000 mg | ORAL_TABLET | Freq: Every day | ORAL | 0 refills | Status: DC
Start: 2021-04-04 — End: 2021-05-06

## 2021-04-03 MED ORDER — IPRATROPIUM-ALBUTEROL 0.5-2.5 (3) MG/3ML IN SOLN: 3.0000 mL | Freq: Four times a day (QID) | RESPIRATORY_TRACT | 0 refills | Status: DC | PRN

## 2021-04-03 MED ORDER — SACUBITRIL-VALSARTAN 24-26 MG PO TABS: 1.0000 | ORAL_TABLET | Freq: Two times a day (BID) | ORAL | 0 refills | Status: DC

## 2021-04-03 MED ORDER — FUROSEMIDE 40 MG PO TABS: 40.0000 mg | ORAL_TABLET | Freq: Every day | ORAL | Status: DC

## 2021-04-03 MED ORDER — FUROSEMIDE 40 MG PO TABS: 40.0000 mg | ORAL_TABLET | Freq: Every day | ORAL | 0 refills | Status: DC

## 2021-04-03 MED ORDER — PREDNISONE 20 MG PO TABS: 40.0000 mg | ORAL_TABLET | Freq: Every day | ORAL | 0 refills | Status: AC

## 2021-04-03 MED ORDER — ASPIRIN 81 MG PO CHEW: 81.0000 mg | CHEWABLE_TABLET | Freq: Every day | ORAL | 0 refills | Status: AC

## 2021-04-03 MED ORDER — SPIRONOLACTONE 25 MG PO TABS: 12.5000 mg | ORAL_TABLET | Freq: Every day | ORAL | 0 refills | Status: DC

## 2021-04-03 NOTE — Discharge Instructions (Signed)
Heart Failure, Diagnosis  Heart failure is a condition in which the heart has trouble pumping blood. This may mean that the heart cannot pump enough blood out to the body or that the heart does not fill up with enough blood. For some people with heart failure, fluid may back up into the lungs. There may also be swelling (edema) in the lower legs. Heart failure is usually a long-term (chronic) condition. It is important for you to take good care of yourself and follow the treatment plan from your health care provider. What are the causes? This condition may be caused by:  High blood pressure (hypertension). Hypertension causes the heart muscle to work harder than normal.  Coronary artery disease, or CAD. CAD is the buildup of cholesterol and fat (plaque) in the arteries of the heart.  Heart attack, also called myocardial infarction. This injures the heart muscle, making it hard for the heart to pump blood.  Abnormal heart valves. The valves do not open and close properly, forcing the heart to pump harder to keep the blood flowing.  Heart muscle disease, inflammation, or infection (cardiomyopathy or myocarditis). This is damage to the heart muscle. It can increase the risk of heart failure.  Lung disease. The heart works harder when the lungs are not healthy. What increases the risk? The risk of heart failure increases as a person ages. This condition is also more likely to develop in people who:  Are obese.  Are female.  Use tobacco or nicotine products.  Abuse alcohol or drugs.  Have taken medicines that can damage the heart, such as chemotherapy drugs.  Have any of these conditions: ? Diabetes. ? Abnormal heart rhythms. ? Thyroid problems. ? Low blood counts (anemia). ? Chronic kidney disease.  Have a family history of heart failure. What are the signs or symptoms? Symptoms of this condition include:  Shortness of breath with activity, such as when climbing stairs.  A cough  that does not go away.  Swelling of the feet, ankles, legs, or abdomen.  Losing or gaining weight for no reason.  Trouble breathing when lying flat.  Waking from sleep because of the need to sit up and get more air.  Rapid heartbeat.  Tiredness (fatigue) and loss of energy.  Feeling light-headed, dizzy, or close to fainting.  Nausea or loss of appetite.  Waking up more often during the night to urinate (nocturia).  Confusion. How is this diagnosed? This condition is diagnosed based on:  Your medical history, symptoms, and a physical exam.  Diagnostic tests, which may include: ? Echocardiogram. ? Electrocardiogram (ECG). ? Chest X-ray. ? Blood tests. ? Exercise stress test. ? Cardiac MRI. ? Cardiac catheterization and angiogram. ? Radionuclide scans. How is this treated? Treatment for this condition is aimed at managing the symptoms of heart failure. Medicines Treatment may include medicines that:  Help lower blood pressure by relaxing (dilating) the blood vessels. These medicines are called ACE inhibitors (angiotensin-converting enzyme), ARBs (angiotensin receptor blockers), or vasodilators.  Cause the kidneys to remove salt and water from the blood through urination (diuretics).  Improve heart muscle strength and prevent the heart from beating too fast (beta blockers).  Increase the force of the heartbeat (digoxin).  Lower heart rates. Certain diabetes medicines (SGLT-2 inhibitors) may also be used in treatment. Healthy behavior changes Treatment may also include making healthy lifestyle changes, such as:  Reaching and staying at a healthy weight.  Not using tobacco or nicotine products.  Eating heart-healthy foods.    Limiting or avoiding alcohol.  Stopping the use of illegal drugs.  Being physically active.  Participating in a cardiac rehabilitation program, which is a treatment program to improve your health and well-being through exercise training,  education, and counseling. Other treatments Other treatments may include:  Procedures to open blocked arteries or repair damaged valves.  Placing a pacemaker to improve heart function (cardiac resynchronization therapy).  Placing a device to treat serious abnormal heart rhythms (implantable cardioverter defibrillator, or ICD).  Placing a device to improve the pumping ability of the heart (left ventricular assist device, or LVAD).  Receiving a healthy heart from a donor (heart transplant). This is done when other treatments have not helped. Follow these instructions at home:  Manage other health conditions as told by your health care provider. These may include hypertension, diabetes, thyroid disease, or abnormal heart rhythms.  Get ongoing education and support as needed. Learn as much as you can about heart failure.  Keep all follow-up visits. This is important. Summary  Heart failure is a condition in which the heart has trouble pumping blood.  This condition is commonly caused by high blood pressure and other diseases of the heart and lungs.  Symptoms of this condition include shortness of breath, tiredness (fatigue), nausea, and swelling of the feet, ankles, legs, or abdomen.  Treatments for this condition may include medicines, lifestyle changes, and surgery.  Manage other health conditions as told by your health care provider. This information is not intended to replace advice given to you by your health care provider. Make sure you discuss any questions you have with your health care provider. Document Revised: 05/29/2020 Document Reviewed: 05/29/2020 Elsevier Patient Education  2021 [place]. Daily Weight Record It is important to weigh yourself daily. To do this:  Make sure you use a reliable scale. Use the same scale each day.  Keep this daily weight chart near your scale.  Weigh yourself each morning at the same time.  Before weighing yourself: ? Take off  your shoes. ? Make sure you are wearing the same amount of clothing each day.  Write down your weight in the spaces on the form.  Compare today's weight to yesterday's weight.  Bring this form with you to your follow-up visits with your health care provider. Call your health care provider if you have concerns about your weight, including rapid weight gain or loss. Date: ________ Weight: ____________________ Date: ________ Weight: ____________________ Date: ________ Weight: ____________________ Date: ________ Weight: ____________________ Date: ________ Weight: ____________________ Date: ________ Weight: ____________________ Date: ________ Weight: ____________________ Date: ________ Weight: ____________________ Date: ________ Weight: ____________________ Date: ________ Weight: ____________________ Date: ________ Weight: ____________________ Date: ________ Weight: ____________________ Date: ________ Weight: ____________________ Date: ________ Weight: ____________________ Date: ________ Weight: ____________________ Date: ________ Weight: ____________________ Date: ________ Weight: ____________________ Date: ________ Weight: ____________________ Date: ________ Weight: ____________________ Date: ________ Weight: ____________________ Date: ________ Weight: ____________________ Date: ________ Weight: ____________________ Date: ________ Weight: ____________________ Date: ________ Weight: ____________________ Date: ________ Weight: ____________________ Date: ________ Weight: ____________________ Date: ________ Weight: ____________________ Date: ________ Weight: ____________________ Date: ________ Weight: ____________________ Date: ________ Weight: ____________________ Date: ________ Weight: ____________________ Date: ________ Weight: ____________________ Date: ________ Weight: ____________________ Date: ________ Weight: ____________________ Date: ________ Weight: ____________________ Date: ________  Weight: ____________________ Date: ________ Weight: ____________________ Date: ________ Weight: ____________________ Date: ________ Weight: ____________________ Date: ________ Weight: ____________________ Date: ________ Weight: ____________________ Date: ________ Weight: ____________________ Date: ________ Weight: ____________________ Date: ________ Weight: ____________________ Date: ________ Weight: ____________________ Date: ________ Weight: ____________________ Date: ________ Weight: ____________________  Date: ________ Weight: ____________________ Date: ________ Weight: ____________________ Date: ________ Weight: ____________________ This information is not intended to replace advice given to you by your health care provider. Make sure you discuss any questions you have with your health care provider. Document Revised: 11/02/2017 Document Reviewed: 11/05/2017 Elsevier Patient Education  2021 [place]. Preventing Heart Failure Heart failure is a condition in which the heart has trouble pumping blood. This may mean that the heart cannot pump enough blood out to the body or that the heart does not fill up with enough blood. Either of those problems can lead to symptoms such as tiredness (fatigue), trouble breathing, and swelling throughout the body. This is a common medical condition that affects not only the heart, but the entire body. Making certain nutrition and lifestyle changes can help you prevent heart failure and avoid serious health problems. How can this condition affect me? Heart failure can cause very serious problems that may get worse over time, such as:  Extreme fatigue during normal physical activities.  Shortness of breath or trouble breathing.  Swelling in your abdomen, legs, ankles, feet, or neck.  Fluid buildup throughout the body.  Weight gain.  Cough.  Frequent urination. What can increase my risk? The risk of heart failure increases as a person ages. The  following factors may also make you more likely to develop this condition:  Being obese.  Being female.  Using tobacco or nicotine products.  Abusing alcohol or drugs.  Having taken medicines that can damage the heart, such as chemotherapy drugs.  Have a family history of heart failure.  Having any of these medical conditions: ? High blood pressure (hypertension). ? Coronary artery disease. ? Diabetes. ? Abnormal heart rhythms. ? Thyroid problems. ? Low blood counts (anemia). ? Lung disease. ? Chronic kidney disease. What actions can I take to prevent heart failure? Nutrition  If you are overweight or obese, reduce how many calories you eat each day so that you lose weight. Work with your health care provider or a diet and nutrition specialist (dietitian) to determine how many calories you need each day.  Eat foods that are low in salt (sodium). Avoid adding extra salt to foods. This can help keep your blood pressure in a normal range.  Eat a well-balanced diet that includes a lot of: ? Plant-based foods. ? Fresh fruits and vegetables. ? Whole grains. ? Lean meats. ? Beans. ? Fat-free or low-fat dairy products.  Avoid foods that contain a lot of: ? Trans fats. ? Saturated fats. ? Sugar. ? Cholesterol.   Alcohol  Do not drink alcohol if: ? Your health care provider tells you not to drink. ? You are pregnant, may be pregnant, or are planning to become pregnant.  If you drink alcohol: ? Limit how much you have to:  0-1 drink a day for women.  0-2 drinks a day for men. ? Know how much alcohol is in your drink. In the [place] one drink equals one 12 oz bottle of beer (355 mL), one 5 oz glass of wine (148 mL), or one 1 oz glass of hard liquor (44 mL). Lifestyle  Do not use any products that contain nicotine or tobacco. These products include cigarettes, chewing tobacco, and vaping devices, such as e-cigarettes. If you need help quitting, ask your health care  provider.  Exercise for at least 30 minutes, 5 days each week, or as much as told by your health care provider. ? Do moderate-intensity exercise, such as brisk  walking, bicycling, or water aerobics. ? Ask your health care provider which activities are safe for you.  Try to get 7-9 hours of sleep each night. To help with sleep: ? Keep your bedroom cool and dark. ? Do not eat a heavy meal during the hour before you go to bed. ? Do not drink alcohol or caffeinated drinks before bed. ? Avoid screen time before bedtime. This means avoiding television, computers, tablets, and mobile phones.  Find ways to relax and manage stress. These may include: ? Breathing exercises. ? Meditation. ? Yoga. ? Listening to music.   General instructions  See a health care provider regularly for screening and wellness checks. Work with your health care provider to manage your: ? Blood pressure. ? Cholesterol levels. ? Blood sugar (glucose) levels. ? Weight.  If you have diabetes, manage your condition and follow your treatment plan as instructed. Where to find more information  National Heart, Lung, and Blood Institute: https://wilson-eaton.com/  Centers for Disease Control and Prevention: http://www.wolf.info/  National Institute on Aging: http://kim-miller.com/  American Heart Association: www.heart.org Contact a health care provider if:  You have rapid weight gain.  You have increasing shortness of breath that is unusual for you.  You tire easily or you are unable to participate in your usual activities.  You cough more than normal, especially with physical activity.  You have any swelling or more swelling in areas such as your hands, feet, ankles, or abdomen. Get help right away if:  You have trouble breathing.  You have pain or discomfort in your chest.  You faint. These symptoms may represent a serious problem that is an emergency. Do not wait to see if the symptoms will go away. Get medical help right  away. Call your local emergency services (911 in the [place] Do not drive yourself to the hospital. Summary  Heart failure can cause very serious problems that may get worse over time.  Heart failure can be prevented by making changes to your diet and lifestyle.  It is important to eat a healthy diet, manage your weight, exercise regularly, manage stress, avoid drugs and alcohol, and keep your cholesterol and blood pressure under control. This information is not intended to replace advice given to you by your health care provider. Make sure you discuss any questions you have with your health care provider. Document Revised: 05/29/2020 Document Reviewed: 05/29/2020 Elsevier Patient Education  [place].

## 2021-04-03 NOTE — Plan of Care (Signed)
  Problem: Clinical Measurements: Goal: Ability to maintain clinical measurements within normal limits will improve 04/03/2021 1025 by Camillia Herter, RN Outcome: Adequate for Discharge 04/03/2021 0709 by Camillia Herter, RN Outcome: Progressing Goal: Will remain free from infection 04/03/2021 1025 by Camillia Herter, RN Outcome: Adequate for Discharge 04/03/2021 0709 by Camillia Herter, RN Outcome: Progressing Goal: Respiratory complications will improve 04/03/2021 1025 by Camillia Herter, RN Outcome: Adequate for Discharge 04/03/2021 0709 by Camillia Herter, RN Outcome: Progressing Goal: Cardiovascular complication will be avoided 04/03/2021 1025 by Camillia Herter, RN Outcome: Adequate for Discharge 04/03/2021 0709 by Camillia Herter, RN Outcome: Progressing   Problem: Activity: Goal: Risk for activity intolerance will decrease 04/03/2021 1025 by Camillia Herter, RN Outcome: Adequate for Discharge 04/03/2021 0709 by Camillia Herter, RN Outcome: Progressing   Problem: Nutrition: Goal: Adequate nutrition will be maintained 04/03/2021 1025 by Camillia Herter, RN Outcome: Adequate for Discharge 04/03/2021 0709 by Camillia Herter, RN Outcome: Progressing   Problem: Coping: Goal: Level of anxiety will decrease 04/03/2021 1025 by Camillia Herter, RN Outcome: Adequate for Discharge 04/03/2021 0709 by Camillia Herter, RN Outcome: Progressing   Problem: Elimination: Goal: Will not experience complications related to bowel motility 04/03/2021 1025 by Camillia Herter, RN Outcome: Adequate for Discharge 04/03/2021 0709 by Camillia Herter, RN Outcome: Progressing Goal: Will not experience complications related to urinary retention 04/03/2021 1025 by Camillia Herter, RN Outcome: Adequate for Discharge 04/03/2021 0709 by Camillia Herter, RN Outcome: Progressing   Problem: Pain Managment: Goal: General experience of comfort will improve 04/03/2021 1025 by Camillia Herter, RN Outcome: Adequate for  Discharge 04/03/2021 0709 by Camillia Herter, RN Outcome: Progressing   Problem: Safety: Goal: Ability to remain free from injury will improve 04/03/2021 1025 by Camillia Herter, RN Outcome: Adequate for Discharge 04/03/2021 0709 by Camillia Herter, RN Outcome: Progressing   Problem: Skin Integrity: Goal: Risk for impaired skin integrity will decrease 04/03/2021 1025 by Camillia Herter, RN Outcome: Adequate for Discharge 04/03/2021 0709 by Camillia Herter, RN Outcome: Progressing

## 2021-04-03 NOTE — Progress Notes (Signed)
Progress Note  Patient Name: Janet [name] Date of Encounter: 04/03/2021  [facility] Cardiologist: Dr Acie Fredrickson  Subjective   No CP; dyspnea resolved  Inpatient Medications    Scheduled Meds: . ARIPiprazole  10 mg Oral Daily  . aspirin  81 mg Oral Daily  . enoxaparin (LOVENOX) injection  60 mg Subcutaneous Daily  . furosemide  40 mg Intravenous Daily  . hydrOXYzine  10 mg Oral BID  . metoprolol succinate  25 mg Oral Daily  . montelukast  10 mg Oral QHS  . predniSONE  40 mg Oral Q breakfast  . sacubitril-valsartan  1 tablet Oral BID  . sodium chloride flush  3 mL Intravenous Q12H  . sodium chloride flush  3 mL Intravenous Q12H  . spironolactone  12.5 mg Oral Daily  . traZODone  100 mg Oral QHS   Continuous Infusions: . sodium chloride Stopped (04/03/21 0056)   PRN Meds: sodium chloride, acetaminophen, albuterol, melatonin, ondansetron (ZOFRAN) IV, sodium chloride flush   Vital Signs    Vitals:   04/02/21 1427 04/02/21 1958 04/02/21 2352 04/03/21 0435  BP: 109/82 (!) 127/91  (!) 110/93  Pulse: 74 69 67 67  Resp: 18 13 15 14   Temp: 98.8 F (37.1 C) 98.8 F (37.1 C) 98 F (36.7 C) 98 F (36.7 C)  TempSrc: Oral Oral  Oral  SpO2: 99% 98% 98% 96%  Weight:    125.6 kg  Height:        Intake/Output Summary (Last 24 hours) at 04/03/2021 0924 Last data filed at 04/03/2021 0981 Gross per 24 hour  Intake 367.34 ml  Output --  Net 367.34 ml   Last 3 Weights 04/03/2021 04/02/2021 03/29/2021  Weight (lbs) 276 lb 12.8 oz 276 lb 12.8 oz 273 lb  Weight (kg) 125.556 kg 125.556 kg 123.832 kg      Telemetry    Sinus with PVCs and 3 beats NSVT- Personally Reviewed  Physical Exam   GEN: NAD Neck: supple, no JVD Cardiac: RRR, no murmur Respiratory: Mild expiratory wheeze GI: Soft, NT, ND MS: No edema Neuro:  No focal findings Psych: Normal affect   Labs    High Sensitivity Troponin:   Recent Labs  Lab 03/29/21 2054 03/29/21 2250 03/30/21 0444  03/30/21 0800 03/31/21 0337  TROPONINIHS 30* 33* 17 17 30*      Chemistry Recent Labs  Lab 03/31/21 0337 04/01/21 1415 04/01/21 1434 04/02/21 0154 04/03/21 0140  NA 138   < > 141 137 138  K 4.4   < > 4.2 3.8 3.9  CL 103  --   --  101 104  CO2 26  --   --  28 28  GLUCOSE 120*  --   --  117* 94  BUN 16  --   --  23* 17  CREATININE 1.09*  --   --  1.29* 1.14*  CALCIUM 9.6  --   --  8.9 8.5*  GFRNONAA >60  --   --  50* 58*  ANIONGAP 9  --   --  8 6   < > = values in this interval not displayed.     Hematology Recent Labs  Lab 03/29/21 2054 03/30/21 0444 04/01/21 1415 04/01/21 1433 04/01/21 1434  WBC 7.9 9.9  --   --   --   RBC 5.16* 5.18*  --   --   --   HGB 14.0 14.0 15.6* 15.6* 15.3*  HCT 43.9 44.5 46.0 46.0 45.0  MCV 85.1 85.9  --   --   --  MCH 27.1 27.0  --   --   --   MCHC 31.9 31.5  --   --   --   RDW 15.0 15.1  --   --   --   PLT 214 203  --   --   --     BNP Recent Labs  Lab 03/29/21 2230  BNP 336.1*     Radiology    CARDIAC CATHETERIZATION  Result Date: 05/17/3661  LV end diastolic pressure is moderately elevated.  There is no aortic valve stenosis.  Hemodynamic findings consistent with mild pulmonary hypertension.  Ao sat 99%, PA sat 73%, mean PA pressure 33 mmHg, mean PCWP 22 mmHg, cardiac output 6.2 L/min, cardiac index 2.6.  No angiographically apparent coronary artery disease. Significant tortuosity noted in the RCA and the circumflex.  Continue diuresis.  She will need aggressive medical therapy for nonischemic cardiomyopathy.    Patient Profile     55 y.o. female with past medical history of substance abuse, hypertension, depression, asthma admitted with asthma exacerbation, acute systolic congestive heart failure and found to have cardiomyopathy.  Echocardiogram showed ejection fraction 30 to 35%, global hypokinesis, akinesis of the inferior lateral wall, moderate left ventricular enlargement, mild left ventricular hypertrophy, mild  left atrial enlargement and mildly dilated aortic root.  Assessment & Plan    1. Nonischemic cardiomyopathy-plan to continue Entresto, Toprol and spironolactone.  Titrate medications as an outpatient.  Repeat echocardiogram in 3 months.   2. Acute systolic congestive heart failure-pulmonary capillary wedge pressure elevated at time of catheterization.  Continue Lasix 40 mg daily following discharge. 3. Minimal elevation in troponin-presentation is not consistent with acute coronary syndrome.  Catheterization reveals no coronary disease. 4. Asthma/URI-continue antibiotics, steroids and bronchodilators per primary care. 5. Cocaine abuse-we discussed the importance of avoiding.  Patient can be discharged from a cardiac standpoint.  Follow-up APP 2 to 4 weeks.  Follow-up with Dr. Acie Fredrickson 3 months.  We will check potassium and renal function 1 week following discharge.  Continue present medications.  Consider Jardiance as outpt. Cardiology will sign off.  Please call with questions.   For questions or updates, please contact [place] Please consult [email or web address] for contact info under        Signed, Kirk Ruths, MD  04/03/2021, 9:24 AM

## 2021-04-03 NOTE — Discharge Summary (Signed)
Physician Discharge Summary  Janet [name] JYN:[id number] DOB: 05/16/1966 DOA: 03/29/2021  PCP: Rema Fendt, NP  Admit date: 03/29/2021 Discharge date: 04/03/2021  Admitted From: Home Disposition: Home  Recommendations for Outpatient Follow-up:  1. Follow up with PCP in 1-2 weeks 2. Follow-up with cardiology APP 2-4 weeks 3. Follow-up with cardiology, Dr. Lourena Simmonds 3 months 4. We will need repeat echocardiogram 3 months 5. Please obtain BMP in one week 6. Please follow up on the following pending results:  Home Health: No Equipment/Devices: None  Discharge Condition: Stable CODE STATUS: Full code Diet recommendation: Heart healthy/low-salt diet  History of present illness:  Janet [name] is a 55 year old female with past medical history significant for asthma, essential hypertension, depression, morbid obesity, polysubstance abuse who presented to [facility] on 5/10 with progressive shortness of breath. Symptoms associated with wheezing and productive cough with yellow sputum production.  Symptoms are worse with exertion, no alleviating factors.  Endorses continued use of marijuana/cocaine. In the ED, patient was afebrile with BP 127/87, HR 106, RR 18, SPO2 95% on 2.5 L nasal cannula.  BNP elevated 336.  Troponin 30>33.  EKG was abnormal and was sent to [facility] for further evaluation and management under hospitalist service for acute asthma exacerbation and abnormal EKG.   Hospital course:  Acute asthma exacerbation Patient initially presented to urgent care with progressive shortness of breath over the past 2-3 days, following marijuana use.  Associated with productive cough with yellow sputum. Initially requiring oxygen on admission, which was slowly titrated off.  Discussed marijuana cessation.  Continue prednisone 40 mg p.o. daily for an additional 4 days on discharge.  Continue Symbicort and Singulair.  DuoNebs with home nebulizer machine as  needed.  Community-acquired pneumonia Patient is afebrile without leukocytosis.  CT angiogram chest negative for PE but with diffuse thickening bronchial wall and groundglass nodule right upper lobe consistent with atypical infection.  COVID-19 PCR/influenza A/B PCR negative.  Completed a course of ceftriaxone and azithromycin while inpatient.  Systolic congestive heart failure, new diagnosis Nonischemic cardiomyopathy Essential HTN On arrival to urgent care, EKG notable for T wave inversions lead II, 3, aVF.  Troponin peaked at 33.  BNP elevated at 336.  TEE 5/11 with LVEF 30-35%, moderate/severely decreased LV function and regional wall motion abnormalities, LV moderately dilated, LA mildly dilated, mild dilation aortic root. Left heart catheterization 5/13 with no findings of CAD. Metoprolol succinate 25 mg p.o. daily, Entresto 24-26 mg p.o. twice daily, Spironolactone 12.5 mg p.o. daily, Furosemide 40 mg PO daily.  Will need repeat echo 3 months.  Outpatient follow-up with cardiology APP 2-4 weeks.  Follow-up with Dr. Melburn Popper 3 months.  BMP 1 week.  Depression Continue Abilify 10 mg p.o. daily and Trazodone 100 mg p.o. nightly  Polysubstance abuse Patient continues to endorse marijuana and crack cocaine use.  UDS positive for THC and cocaine.  Patient counseled on need for complete cessation given her underlying asthma as well as new congestive heart failure.  Patient was seen by social work and offered outpatient substance use treatment services in which she accepted.  Discharge Diagnoses:  Active Problems:   Acute systolic heart failure [facility])    Discharge Instructions  Discharge Instructions    (HEART FAILURE PATIENTS) Call MD:  Anytime you have any of the following symptoms: 1) 3 pound weight gain in 24 hours or 5 pounds in 1 week 2) shortness of breath, with or without a dry hacking cough 3) swelling in the  hands, feet or stomach 4) if you have to sleep on extra pillows at night  in order to breathe.   Complete by: As directed    Call MD for:  difficulty breathing, headache or visual disturbances   Complete by: As directed    Call MD for:  extreme fatigue   Complete by: As directed    Call MD for:  persistant dizziness or light-headedness   Complete by: As directed    Call MD for:  persistant nausea and vomiting   Complete by: As directed    Call MD for:  severe uncontrolled pain   Complete by: As directed    Call MD for:  temperature >100.4   Complete by: As directed    Diet - low sodium heart healthy   Complete by: As directed    Increase activity slowly   Complete by: As directed      Allergies as of 04/03/2021      Reactions   Clarithromycin Hives   GI upset   Penicillins Rash, Hives   Whey    Unknown reaction - showed on allergy test      Medication List    STOP taking these medications   losartan-hydrochlorothiazide 100-12.5 MG tablet Commonly known as: HYZAAR     TAKE these medications   albuterol (5 MG/ML) 0.5% nebulizer solution Commonly known as: PROVENTIL Take 0.5 mLs (2.5 mg total) by nebulization every 6 (six) hours as needed for wheezing or shortness of breath.   albuterol 108 (90 Base) MCG/ACT inhaler Commonly known as: VENTOLIN HFA Inhale 1-2 puffs into the lungs every 6 (six) hours as needed for wheezing or shortness of breath.   ARIPiprazole 10 MG tablet Commonly known as: ABILIFY Take 10 mg by mouth daily.   aspirin 81 MG chewable tablet Chew 1 tablet (81 mg total) by mouth daily. Start taking on: Apr 04, 2021   budesonide-formoterol 160-4.5 MCG/ACT inhaler Commonly known as: SYMBICORT Inhale 2 puffs into the lungs 2 (two) times daily.   fluticasone 50 MCG/ACT nasal spray Commonly known as: FLONASE Place 2 sprays into both nostrils daily. What changed:   when to take this  reasons to take this   furosemide 40 MG tablet Commonly known as: LASIX Take 1 tablet (40 mg total) by mouth daily.   hydrOXYzine 10  MG tablet Commonly known as: ATARAX/VISTARIL Take 10 mg by mouth 2 (two) times daily.   ibuprofen 600 MG tablet Commonly known as: ADVIL Take 600 mg by mouth daily as needed for mild pain.   ipratropium-albuterol 0.5-2.5 (3) MG/3ML Soln Commonly known as: DUONEB Take 3 mLs by nebulization every 6 (six) hours as needed (wheezing, shortness of breath).   metoprolol succinate 25 MG 24 hr tablet Commonly known as: TOPROL-XL Take 1 tablet (25 mg total) by mouth daily. Start taking on: Apr 04, 2021   montelukast 10 MG tablet Commonly known as: SINGULAIR Take 1 tablet (10 mg total) by mouth at bedtime.   predniSONE 20 MG tablet Commonly known as: DELTASONE Take 2 tablets (40 mg total) by mouth daily with breakfast for 4 days. Start taking on: Apr 04, 2021   sacubitril-valsartan 24-26 MG Commonly known as: ENTRESTO Take 1 tablet by mouth 2 (two) times daily.   spironolactone 25 MG tablet Commonly known as: ALDACTONE Take 0.5 tablets (12.5 mg total) by mouth daily. Start taking on: Apr 04, 2021   traZODone 100 MG tablet Commonly known as: DESYREL Take 100 mg by mouth at bedtime.  Follow-up Information    [facility] Follow up on 04/08/2021.   Specialty: Cardiology Why: obtain BMET blood work in 1 week. (no fasting needed) Contact information: [address], [address] [place]       Nahser, Wonda Cheng, MD Follow up.   Specialty: Cardiology Why: office scheduler will contact you to arrange 2 week follow up Dr. Acie Fredrickson PA/NP and 3 month follow up with Dr. Acie Fredrickson, give [place] a call if you do not hear from our scheduler in 3 business days.  Contact information: [address] [place] [address] [phone]        Camillia Herter, NP. Schedule an appointment as soon as possible for a visit in 1 week(s).   Specialty: Nurse Practitioner Contact information: [address] [place] [address] [phone]              Allergies  Allergen Reactions  . Clarithromycin Hives    GI upset  . Penicillins Rash and Hives  . Whey     Unknown reaction - showed on allergy test    Consultations:  Cardiology   Procedures/Studies: DG Chest 2 View  Result Date: 03/29/2021 CLINICAL DATA:  Chest pain EXAM: CHEST - 2 VIEW COMPARISON:  12/08/2020 FINDINGS: Borderline cardiomegaly. Both lungs are clear. The visualized skeletal structures are unremarkable. IMPRESSION: No active cardiopulmonary disease. Electronically Signed   By: Donavan Foil M.D.   On: 03/29/2021 21:14   CT Angio Chest PE W and/or Wo Contrast  Result Date: 03/30/2021 CLINICAL DATA:  55 year old female with concern for pulmonary embolism. EXAM: CT ANGIOGRAPHY CHEST WITH CONTRAST TECHNIQUE: Multidetector CT imaging of the chest was performed using the standard protocol during bolus administration of intravenous contrast. Multiplanar CT image reconstructions and MIPs were obtained to evaluate the vascular anatomy. CONTRAST:  127mL OMNIPAQUE IOHEXOL 350 MG/ML SOLN COMPARISON:  Chest radiograph dated 03/29/2021. FINDINGS: Cardiovascular: Mild cardiomegaly. No pericardial effusion. The thoracic aorta is unremarkable. No pulmonary artery embolus identified. Mediastinum/Nodes: No hilar or mediastinal adenopathy. The esophagus is grossly unremarkable as visualized. No mediastinal fluid collection. Lungs/Pleura: No focal consolidation, pleural effusion, or pneumothorax. Several faint small ground-glass nodule in the right upper lobe measuring up to 3 mm (34/6) may represent atypical infection. There is diffuse thickening of the bronchial wall concerning for bronchitis. The central airways remain patent. Upper Abdomen: No acute abnormality. Musculoskeletal: Degenerative changes of the spine. No acute osseous pathology. Review of the MIP images confirms the above findings. IMPRESSION: 1. No CT evidence of  pulmonary embolism. 2. Diffuse thickening of the bronchial wall concerning for bronchitis. 3. Several faint small ground-glass nodule in the right upper lobe may represent atypical infection. Electronically Signed   By: Anner Crete M.D.   On: 03/30/2021 00:29   CARDIAC CATHETERIZATION  Result Date: 4/40/3474  LV end diastolic pressure is moderately elevated.  There is no aortic valve stenosis.  Hemodynamic findings consistent with mild pulmonary hypertension.  Ao sat 99%, PA sat 73%, mean PA pressure 33 mmHg, mean PCWP 22 mmHg, cardiac output 6.2 L/min, cardiac index 2.6.  No angiographically apparent coronary artery disease. Significant tortuosity noted in the RCA and the circumflex.  Continue diuresis.  She will need aggressive medical therapy for nonischemic cardiomyopathy.   ECHOCARDIOGRAM LIMITED  Result Date: 03/30/2021    ECHOCARDIOGRAM LIMITED REPORT   Patient Name:   Janet [name] Date of Exam: 03/30/2021 Medical Rec #:  [id number]  Height:       68.0 in Accession #:    [id number]        Weight:       273.0 lb Date of Birth:  1966-03-09         BSA:          2.333 m Patient Age:    54 years          BP:           154/98 mmHg Patient Gender: F                 HR:           90 bpm. Exam Location:  Inpatient Procedure: 2D Echo, Cardiac Doppler and Color Doppler Indications:    Elevated Troponin  History:        Patient has prior history of Echocardiogram examinations, most                 recent 08/12/2020. Signs/Symptoms:Shortness of Breath. Coughing.  Sonographer:    Roosvelt Maser Referring Phys: [id number] Darlin Drop  Sonographer Comments: This was a limited echo for LV function IMPRESSIONS  1. Limited study for LV function; limited views and no doppler performed; global hypokinesis and akinesis of the inferolateral wall with overall moderate to severe LV dysfunction.  2. Left ventricular ejection fraction, by estimation, is 30 to 35%. The left ventricle has moderate to severely  decreased function. The left ventricle demonstrates regional wall motion abnormalities (see scoring diagram/findings for description). The left ventricular internal cavity size was moderately dilated. There is mild left ventricular hypertrophy.  3. Right ventricular systolic function is normal. The right ventricular size is normal.  4. Left atrial size was mildly dilated.  5. Aortic dilatation noted. There is mild dilatation of the aortic root, measuring 41 mm. FINDINGS  Left Ventricle: Left ventricular ejection fraction, by estimation, is 30 to 35%. The left ventricle has moderate to severely decreased function. The left ventricle demonstrates regional wall motion abnormalities. The left ventricular internal cavity size was moderately dilated. There is mild left ventricular hypertrophy. Right Ventricle: The right ventricular size is normal. Right ventricular systolic function is normal. Left Atrium: Left atrial size was mildly dilated. Right Atrium: Right atrial size was normal in size. Pericardium: There is no evidence of pericardial effusion. Aorta: Aortic dilatation noted. There is mild dilatation of the aortic root, measuring 41 mm. Additional Comments: Limited study for LV function; limited views and no doppler performed; global hypokinesis and akinesis of the inferolateral wall with overall moderate to severe LV dysfunction.  LV Volumes (MOD) LV vol d, MOD A2C: 164.0 ml LV vol d, MOD A4C: 166.0 ml LV vol s, MOD A2C: 103.0 ml LV vol s, MOD A4C: 98.7 ml LV SV MOD A2C:     61.0 ml LV SV MOD A4C:     166.0 ml LV SV MOD BP:      65.4 ml Olga Millers MD Electronically signed by Olga Millers MD Signature Date/Time: 03/30/2021/6:06:49 PM    Final       Subjective: Patient seen and examined at bedside, resting comfortably.  No complaints this morning.  Okay for discharge home per cardiology with outpatient follow-up.  Denies headache, no visual changes, no chest pain, palpitations, no shortness of breath, no  abdominal pain, no weakness, no fatigue, no nausea/vomiting/diarrhea, no fever/chills/night sweats.  No acute events overnight per nursing staff.  Discharge Exam: Vitals:   04/03/21 0435 04/03/21 0927  BP: Marland Kitchen)  110/93   Pulse: 67   Resp: 14   Temp: 98 F (36.7 C) 99.2 F (37.3 C)  SpO2: 96%    Vitals:   04/02/21 1958 04/02/21 2352 04/03/21 0435 04/03/21 0927  BP: (!) 127/91  (!) 110/93   Pulse: 69 67 67   Resp: 13 15 14    Temp: 98.8 F (37.1 C) 98 F (36.7 C) 98 F (36.7 C) 99.2 F (37.3 C)  TempSrc: Oral  Oral Oral  SpO2: 98% 98% 96%   Weight:   125.6 kg   Height:        General: Pt is alert, awake, not in acute distress Cardiovascular: RRR, S1/S2 +, no rubs, no gallops Respiratory: CTA bilaterally, no wheezing, no rhonchi, on room air Abdominal: Soft, NT, ND, bowel sounds + Extremities: no edema, no cyanosis    The results of significant diagnostics from this hospitalization (including imaging, microbiology, ancillary and laboratory) are listed below for reference.     Microbiology: Recent Results (from the past 240 hour(s))  Resp Panel by RT-PCR (Flu A&B, Covid) Nasopharyngeal Swab     Status: None   Collection Time: 03/29/21 11:00 PM   Specimen: Nasopharyngeal Swab; Nasopharyngeal(NP) swabs in vial transport medium  Result Value Ref Range Status   SARS Coronavirus 2 by RT PCR NEGATIVE NEGATIVE Final    Comment: (NOTE) SARS-CoV-2 target nucleic acids are NOT DETECTED.  The SARS-CoV-2 RNA is generally detectable in upper respiratory specimens during the acute phase of infection. The lowest concentration of SARS-CoV-2 viral copies this assay can detect is 138 copies/mL. A negative result does not preclude SARS-Cov-2 infection and should not be used as the sole basis for treatment or other patient management decisions. A negative result may occur with  improper specimen collection/handling, submission of specimen other than nasopharyngeal swab, presence of  viral mutation(s) within the areas targeted by this assay, and inadequate number of viral copies(<138 copies/mL). A negative result must be combined with clinical observations, patient history, and epidemiological information. The expected result is Negative.  Fact Sheet for Patients:  05/29/21  Fact Sheet for Healthcare Providers:  BloggerCourse.com  This test is no t yet approved or cleared by the [place] FDA and  has been authorized for detection and/or diagnosis of SARS-CoV-2 by FDA under an Emergency Use Authorization (EUA). This EUA will remain  in effect (meaning this test can be used) for the duration of the COVID-19 declaration under Section 564(b)(1) of the Act, 21 U.S.C.section 360bbb-3(b)(1), unless the authorization is terminated  or revoked sooner.       Influenza A by PCR NEGATIVE NEGATIVE Final   Influenza B by PCR NEGATIVE NEGATIVE Final    Comment: (NOTE) The Xpert Xpress SARS-CoV-2/FLU/RSV plus assay is intended as an aid in the diagnosis of influenza from Nasopharyngeal swab specimens and should not be used as a sole basis for treatment. Nasal washings and aspirates are unacceptable for Xpert Xpress SARS-CoV-2/FLU/RSV testing.  Fact Sheet for Patients: [place]  Fact Sheet for Healthcare Providers: BloggerCourse.com  This test is not yet approved or cleared by the [place] FDA and has been authorized for detection and/or diagnosis of SARS-CoV-2 by FDA under an Emergency Use Authorization (EUA). This EUA will remain in effect (meaning this test can be used) for the duration of the COVID-19 declaration under Section 564(b)(1) of the Act, 21 U.S.C. section 360bbb-3(b)(1), unless the authorization is terminated or revoked.  Performed at [facility], [address] [place], [place]  Labs: BNP (last 3  results) Recent Labs    03/29/21 2230  BNP 981.1*   Basic Metabolic Panel: Recent Labs  Lab 03/29/21 2054 03/30/21 0444 03/31/21 0337 04/01/21 1415 04/01/21 1433 04/01/21 1434 04/02/21 0154 04/03/21 0140  NA 136 136 138 140 141 141 137 138  K 3.7 3.7 4.4 4.1 4.2 4.2 3.8 3.9  CL 105 107 103  --   --   --  101 104  CO2 25 21* 26  --   --   --  28 28  GLUCOSE 110* 216* 120*  --   --   --  117* 94  BUN 12 13 16   --   --   --  23* 17  CREATININE 1.05* 1.09* 1.09*  --   --   --  1.29* 1.14*  CALCIUM 8.7* 8.7* 9.6  --   --   --  8.9 8.5*  MG  --  2.1  --   --   --   --   --   --   PHOS  --  2.6  --   --   --   --   --   --    Liver Function Tests: No results for input(s): AST, ALT, ALKPHOS, BILITOT, PROT, ALBUMIN in the last 168 hours. No results for input(s): LIPASE, AMYLASE in the last 168 hours. No results for input(s): AMMONIA in the last 168 hours. CBC: Recent Labs  Lab 03/29/21 2054 03/30/21 0444 04/01/21 1415 04/01/21 1433 04/01/21 1434  WBC 7.9 9.9  --   --   --   HGB 14.0 14.0 15.6* 15.6* 15.3*  HCT 43.9 44.5 46.0 46.0 45.0  MCV 85.1 85.9  --   --   --   PLT 214 203  --   --   --    Cardiac Enzymes: No results for input(s): CKTOTAL, CKMB, CKMBINDEX, TROPONINI in the last 168 hours. BNP: Invalid input(s): POCBNP CBG: No results for input(s): GLUCAP in the last 168 hours. D-Dimer No results for input(s): DDIMER in the last 72 hours. Hgb A1c No results for input(s): HGBA1C in the last 72 hours. Lipid Profile No results for input(s): CHOL, HDL, LDLCALC, TRIG, CHOLHDL, LDLDIRECT in the last 72 hours. Thyroid function studies No results for input(s): TSH, T4TOTAL, T3FREE, THYROIDAB in the last 72 hours.  Invalid input(s): FREET3 Anemia work up No results for input(s): VITAMINB12, FOLATE, FERRITIN, TIBC, IRON, RETICCTPCT in the last 72 hours. Urinalysis No results found for: COLORURINE, APPEARANCEUR, [place], [place], GLUCOSEU, [place], [place],  [place], PROTEINUR, UROBILINOGEN, NITRITE, LEUKOCYTESUR Sepsis Labs Invalid input(s): PROCALCITONIN,  WBC,  LACTICIDVEN Microbiology Recent Results (from the past 240 hour(s))  Resp Panel by RT-PCR (Flu A&B, Covid) Nasopharyngeal Swab     Status: None   Collection Time: 03/29/21 11:00 PM   Specimen: Nasopharyngeal Swab; Nasopharyngeal(NP) swabs in vial transport medium  Result Value Ref Range Status   SARS Coronavirus 2 by RT PCR NEGATIVE NEGATIVE Final    Comment: (NOTE) SARS-CoV-2 target nucleic acids are NOT DETECTED.  The SARS-CoV-2 RNA is generally detectable in upper respiratory specimens during the acute phase of infection. The lowest concentration of SARS-CoV-2 viral copies this assay can detect is 138 copies/mL. A negative result does not preclude SARS-Cov-2 infection and should not be used as the sole basis for treatment or other patient management decisions. A negative result may occur with  improper specimen collection/handling, submission of specimen other than nasopharyngeal swab, presence of viral mutation(s) within the  areas targeted by this assay, and inadequate number of viral copies(<138 copies/mL). A negative result must be combined with clinical observations, patient history, and epidemiological information. The expected result is Negative.  Fact Sheet for Patients:  EntrepreneurPulse.com.au  Fact Sheet for Healthcare Providers:  IncredibleEmployment.be  This test is no t yet approved or cleared by the [place] FDA and  has been authorized for detection and/or diagnosis of SARS-CoV-2 by FDA under an Emergency Use Authorization (EUA). This EUA will remain  in effect (meaning this test can be used) for the duration of the COVID-19 declaration under Section 564(b)(1) of the Act, 21 U.S.C.section 360bbb-3(b)(1), unless the authorization is terminated  or revoked sooner.       Influenza A by PCR NEGATIVE NEGATIVE  Final   Influenza B by PCR NEGATIVE NEGATIVE Final    Comment: (NOTE) The Xpert Xpress SARS-CoV-2/FLU/RSV plus assay is intended as an aid in the diagnosis of influenza from Nasopharyngeal swab specimens and should not be used as a sole basis for treatment. Nasal washings and aspirates are unacceptable for Xpert Xpress SARS-CoV-2/FLU/RSV testing.  Fact Sheet for Patients: EntrepreneurPulse.com.au  Fact Sheet for Healthcare Providers: IncredibleEmployment.be  This test is not yet approved or cleared by the [place] FDA and has been authorized for detection and/or diagnosis of SARS-CoV-2 by FDA under an Emergency Use Authorization (EUA). This EUA will remain in effect (meaning this test can be used) for the duration of the COVID-19 declaration under Section 564(b)(1) of the Act, 21 U.S.C. section 360bbb-3(b)(1), unless the authorization is terminated or revoked.  Performed at [facility], [place] [address], [place], [place] [address]      Time coordinating discharge: Over 30 minutes  SIGNED:   Charlyne Robertshaw J [place] DO  [facility] 04/03/2021, 10:25 AM

## 2021-04-03 NOTE — Plan of Care (Signed)
  Problem: Clinical Measurements: Goal: Ability to maintain clinical measurements within normal limits will improve Outcome: Progressing Goal: Will remain free from infection Outcome: Progressing Goal: Respiratory complications will improve Outcome: Progressing Goal: Cardiovascular complication will be avoided Outcome: Progressing   Problem: Activity: Goal: Risk for activity intolerance will decrease Outcome: Progressing   Problem: Nutrition: Goal: Adequate nutrition will be maintained Outcome: Progressing   Problem: Coping: Goal: Level of anxiety will decrease Outcome: Progressing   Problem: Elimination: Goal: Will not experience complications related to bowel motility Outcome: Progressing Goal: Will not experience complications related to urinary retention Outcome: Progressing   Problem: Pain Managment: Goal: General experience of comfort will improve Outcome: Progressing   Problem: Safety: Goal: Ability to remain free from injury will improve Outcome: Progressing   Problem: Skin Integrity: Goal: Risk for impaired skin integrity will decrease Outcome: Progressing   

## 2021-04-04 ENCOUNTER — Encounter (HOSPITAL_COMMUNITY): Payer: Self-pay | Admitting: Interventional Cardiology

## 2021-04-07 NOTE — ED Provider Notes (Signed)
[facility]    CSN: [id number] Arrival date & time: 03/29/21  1941      History   Chief Complaint Chief Complaint  Patient presents with  . Shortness of Breath  . Chest Pain    HPI Janet [name] is a 55 y.o. female who presents with cough, SOB, wheezing, chest pain x 2-3 days. Her albuterol inhaler and neb has not helped. Her last treatment was 3h ago. Denies HA, vision changes, N/V or dizziness     Past Medical History:  Diagnosis Date  . Asthma   . Depression   . Elevated brain natriuretic peptide (BNP) level   . Hypertension   . Lymphedema   . SOB (shortness of breath)     Patient Active Problem List   Diagnosis Date Noted  . Acute systolic heart failure ([place])   . Essential hypertension 02/17/2021  . Asthma   . Lymphedema   . Elevated brain natriuretic peptide (BNP) level   . DOE (dyspnea on exertion) 07/28/2020  . Morbid obesity ([place]) 07/28/2020    Past Surgical History:  Procedure Laterality Date  . ABDOMINAL HYSTERECTOMY    . CESAREAN SECTION    . HERNIA REPAIR    . RIGHT/LEFT HEART CATH AND CORONARY ANGIOGRAPHY N/A 04/01/2021   Procedure: RIGHT/LEFT HEART CATH AND CORONARY ANGIOGRAPHY;  Surgeon: Jettie Booze, MD;  Location: [facility];  Service: Cardiovascular;  Laterality: N/A;  . TUBAL LIGATION      OB History   No obstetric history on file.      Home Medications    Prior to Admission medications   Medication Sig Start Date End Date Taking? Authorizing Provider  albuterol (PROVENTIL) (5 MG/ML) 0.5% nebulizer solution Take 0.5 mLs (2.5 mg total) by nebulization every 6 (six) hours as needed for wheezing or shortness of breath. 03/21/21   Camillia Herter, NP  albuterol (VENTOLIN HFA) 108 (90 Base) MCG/ACT inhaler Inhale 1-2 puffs into the lungs every 6 (six) hours as needed for wheezing or shortness of breath. 03/21/21   Camillia Herter, NP  ARIPiprazole (ABILIFY) 10 MG tablet Take 10 mg by mouth daily.    [provider]  aspirin 81 MG chewable tablet Chew 1 tablet (81 mg total) by mouth daily. 04/04/21 07/03/21  [place] Donnamarie Poag, DO  budesonide-formoterol (SYMBICORT) 160-4.5 MCG/ACT inhaler Inhale 2 puffs into the lungs 2 (two) times daily. 03/21/21   Camillia Herter, NP  fluticasone (FLONASE) 50 MCG/ACT nasal spray Place 2 sprays into both nostrils daily. Patient taking differently: Place 2 sprays into both nostrils daily as needed for allergies. 03/21/21   Camillia Herter, NP  furosemide (LASIX) 40 MG tablet Take 1 tablet (40 mg total) by mouth daily. 04/03/21 07/02/21  [place] Eric J, DO  hydrOXYzine (ATARAX/VISTARIL) 10 MG tablet Take 10 mg by mouth 2 (two) times daily. 11/26/20   [provider]  ibuprofen (ADVIL) 600 MG tablet Take 600 mg by mouth daily as needed for mild pain. 10/06/20   [provider]  ipratropium-albuterol (DUONEB) 0.5-2.5 (3) MG/3ML SOLN Take 3 mLs by nebulization every 6 (six) hours as needed (wheezing, shortness of breath). 04/03/21 07/02/21  [place] Donnamarie Poag, DO  metoprolol succinate (TOPROL-XL) 25 MG 24 hr tablet Take 1 tablet (25 mg total) by mouth daily. 04/04/21 07/03/21  [place] Eric J, DO  montelukast (SINGULAIR) 10 MG tablet Take 1 tablet (10 mg total) by mouth at bedtime. 03/21/21   Camillia Herter, NP  predniSONE (DELTASONE) 20 MG tablet Take 2 tablets (40 mg total) by mouth daily with breakfast for 4 days. 04/04/21 04/08/21  [place] Eric J, DO  sacubitril-valsartan (ENTRESTO) 24-26 MG Take 1 tablet by mouth 2 (two) times daily. 04/03/21 07/02/21  [place] Donnamarie Poag, DO  spironolactone (ALDACTONE) 25 MG tablet Take 0.5 tablets (12.5 mg total) by mouth daily. 04/04/21 07/03/21  [place] Donnamarie Poag, DO  traZODone (DESYREL) 100 MG tablet Take 100 mg by mouth at bedtime.    [provider]    Family History Family History  Problem Relation Age of Onset  . Hypertension Mother   . Stroke Mother   . Deep vein thrombosis Mother   . Congestive Heart Failure Mother    . Obesity Sister   . Obesity Brother   . Congestive Heart Failure Maternal Grandmother     Social History Social History   Tobacco Use  . Smoking status: Passive Smoke Exposure - Never Smoker  . Smokeless tobacco: Never Used  Vaping Use  . Vaping Use: Never used  Substance Use Topics  . Alcohol use: Yes    Comment: occasional  . Drug use: Yes    Types: Marijuana, "Crack" cocaine    Comment: once/month     Allergies   Clarithromycin, Penicillins, and Whey   Review of Systems Review of Systems + CP, SOB  Physical Exam Triage Vital Signs ED Triage Vitals  Enc Vitals Group     BP 03/29/21 1950 124/83     Pulse Rate 03/29/21 1950 84     Resp 03/29/21 1950 (!) 26     Temp 03/29/21 1950 98.3 F (36.8 C)     Temp Source 03/29/21 1950 Oral     SpO2 03/29/21 1950 98 %     Weight --      Height --      Head Circumference --      Peak Flow --      Pain Score 03/29/21 1949 7     Pain Loc --      Pain Edu? --      Excl. in [place]? --    No data found.  Updated Vital Signs BP 124/83 (BP Location: Right Arm)   Pulse 84   Temp 98.3 F (36.8 C) (Oral)   Resp (!) 26   SpO2 98%   Visual Acuity Right Eye Distance:   Left Eye Distance:   Bilateral Distance:    Right Eye Near:   Left Eye Near:    Bilateral Near:     Physical Exam No exam done since she was interviewed in [place]  UC Treatments / Results  Labs (all labs ordered are listed, but only abnormal results are displayed) Labs Reviewed - No data to display  EKG   Radiology No results found.  Procedures Procedures (including critical care time)  Medications Ordered in UC Medications - No data to display  Initial Impression / Assessment and Plan / UC Course  I have reviewed the triage vital signs and the nursing notes.  Sent to ER.  Final Clinical Impressions(s) / UC Diagnoses   Final diagnoses:  None   Discharge Instructions   None    ED Prescriptions    None     PDMP not reviewed  this encounter.   Shelby Mattocks, PA-C 04/07/21 2201

## 2021-04-08 ENCOUNTER — Other Ambulatory Visit: Payer: BC Managed Care – PPO | Admitting: *Deleted

## 2021-04-08 ENCOUNTER — Other Ambulatory Visit: Payer: Self-pay

## 2021-04-08 DIAGNOSIS — Z79899 Other long term (current) drug therapy: Secondary | ICD-10-CM

## 2021-04-09 LAB — BASIC METABOLIC PANEL
BUN/Creatinine Ratio: 16 (ref 9–23)
BUN: 17 mg/dL (ref 6–24)
CO2: 24 mmol/L (ref 20–29)
Calcium: 9.4 mg/dL (ref 8.7–10.2)
Chloride: 101 mmol/L (ref 96–106)
Creatinine, Ser: 1.06 mg/dL — ABNORMAL HIGH (ref 0.57–1.00)
Glucose: 76 mg/dL (ref 65–99)
Potassium: 4.4 mmol/L (ref 3.5–5.2)
Sodium: 139 mmol/L (ref 134–144)
eGFR: 63 mL/min/{1.73_m2} (ref >59)

## 2021-04-27 ENCOUNTER — Encounter: Payer: Self-pay | Admitting: Pulmonary Disease

## 2021-04-27 ENCOUNTER — Ambulatory Visit: Payer: BC Managed Care – PPO | Admitting: Pulmonary Disease

## 2021-04-27 ENCOUNTER — Other Ambulatory Visit: Payer: Self-pay

## 2021-04-27 VITALS — BP 116/82 | HR 73 | Ht 68.0 in | Wt 278.0 lb

## 2021-04-27 DIAGNOSIS — R0609 Other forms of dyspnea: Secondary | ICD-10-CM

## 2021-04-27 DIAGNOSIS — R0600 Dyspnea, unspecified: Secondary | ICD-10-CM | POA: Diagnosis not present

## 2021-04-27 DIAGNOSIS — J45909 Unspecified asthma, uncomplicated: Secondary | ICD-10-CM | POA: Diagnosis not present

## 2021-04-27 MED ORDER — BREZTRI AEROSPHERE 160-9-4.8 MCG/ACT IN AERO: 2.0000 | INHALATION_SPRAY | Freq: Two times a day (BID) | RESPIRATORY_TRACT | 0 refills | Status: DC

## 2021-04-27 NOTE — Progress Notes (Signed)
@Patient  ID: Janet [name], adult    DOB: January 22, 1966, 55 y.o.   MRN: [id number]  CC: DOE  Referring provider: Camillia Herter, NP  HPI:   55 year old whom we are seeing in consultation for evaluation of dyspnea on exertion.  Extensive notes from recent hospitalization reviewed.  Most recent PCP note reviewed.  Patient was ports dyspnea on exertion present for many months.  She noted increasing lower examinee swelling.  She presented to the hospital.  TTE 03/30/2021 revealed EF 30 to 35%, mildly dilated LA. EF further reduced from TTE reviewed 08/12/2020 when was 46%.  She had a right heart cath and left heart cath 04/01/2021 with no coronary disease, elevated LVEDP 30, mean PA pressure in the 30s.  She received extensive diuresis.  She had great improvement in dyspnea.  Her dyspnea is quite improved in the outpatient setting.  Weight is stable.  Feels like swelling is doing well.  History of atopic symptoms.  Had used albuterol inhaler and duo nebs with mild improvement in symptoms at times.  Has found Symbicort to be somewhat helpful.  Continues Singulair.  Breathing does get worse with pollens or seasonal changes.  No environmental factors she can identify.  No time during there things are better or worse.  No other relieving or exacerbating factors.  Chest x-ray 11/2020 and 03/2021 both reviewed and interpreted as clear lungs.  CT chest 03/29/2021 reviewed and interpreted as mild mosaicism, otherwise clear lungs.  PMH: asthma, NICM Surgical History: Hysterectomy, C-section, hernia repair Family history: Mother with hypertension, CVA, DVT, CHF Social history: Never smoker, lives in [place] / Pulmonary Flowsheets:   ACT:  Asthma Control Test ACT Total Score  04/27/2021 16    MMRC: No flowsheet data found.  Epworth:  No flowsheet data found.  Tests:   FENO:  No results found for: NITRICOXIDE  PFT: No flowsheet data found.  WALK:  No flowsheet data  found.  Imaging: Personally reviewed and as per EMR discussion in this note DG Chest 2 View  Result Date: 03/29/2021 CLINICAL DATA:  Chest pain EXAM: CHEST - 2 VIEW COMPARISON:  12/08/2020 FINDINGS: Borderline cardiomegaly. Both lungs are clear. The visualized skeletal structures are unremarkable. IMPRESSION: No active cardiopulmonary disease. Electronically Signed   By: Donavan Foil M.D.   On: 03/29/2021 21:14   CT Angio Chest PE W and/or Wo Contrast  Result Date: 03/30/2021 CLINICAL DATA:  55 year old female with concern for pulmonary embolism. EXAM: CT ANGIOGRAPHY CHEST WITH CONTRAST TECHNIQUE: Multidetector CT imaging of the chest was performed using the standard protocol during bolus administration of intravenous contrast. Multiplanar CT image reconstructions and MIPs were obtained to evaluate the vascular anatomy. CONTRAST:  177mL OMNIPAQUE IOHEXOL 350 MG/ML SOLN COMPARISON:  Chest radiograph dated 03/29/2021. FINDINGS: Cardiovascular: Mild cardiomegaly. No pericardial effusion. The thoracic aorta is unremarkable. No pulmonary artery embolus identified. Mediastinum/Nodes: No hilar or mediastinal adenopathy. The esophagus is grossly unremarkable as visualized. No mediastinal fluid collection. Lungs/Pleura: No focal consolidation, pleural effusion, or pneumothorax. Several faint small ground-glass nodule in the right upper lobe measuring up to 3 mm (34/6) may represent atypical infection. There is diffuse thickening of the bronchial wall concerning for bronchitis. The central airways remain patent. Upper Abdomen: No acute abnormality. Musculoskeletal: Degenerative changes of the spine. No acute osseous pathology. Review of the MIP images confirms the above findings. IMPRESSION: 1. No CT evidence of pulmonary embolism. 2. Diffuse thickening of the bronchial wall concerning for bronchitis. 3. Several faint small  ground-glass nodule in the right upper lobe may represent atypical infection. Electronically  Signed   By: Anner Crete M.D.   On: 03/30/2021 00:29   CARDIAC CATHETERIZATION  Result Date: 8/41/6606  LV end diastolic pressure is moderately elevated.  There is no aortic valve stenosis.  Hemodynamic findings consistent with mild pulmonary hypertension.  Ao sat 99%, PA sat 73%, mean PA pressure 33 mmHg, mean PCWP 22 mmHg, cardiac output 6.2 L/min, cardiac index 2.6.  No angiographically apparent coronary artery disease. Significant tortuosity noted in the RCA and the circumflex.  Continue diuresis.  She will need aggressive medical therapy for nonischemic cardiomyopathy.   ECHOCARDIOGRAM LIMITED  Result Date: 03/30/2021    ECHOCARDIOGRAM LIMITED REPORT   Patient Name:   Janet [name] Date of Exam: 03/30/2021 Medical Rec #:  [id number]         Height:       68.0 in Accession #:    [id number]        Weight:       273.0 lb Date of Birth:  [birth year]         BSA:          2.333 m Patient Age:    26 years          BP:           154/98 mmHg Patient Gender: F                 HR:           90 bpm. Exam Location:  Inpatient Procedure: 2D Echo, Cardiac Doppler and Color Doppler Indications:    Elevated Troponin  History:        Patient has prior history of Echocardiogram examinations, most                 recent 08/12/2020. Signs/Symptoms:Shortness of Breath. Coughing.  Sonographer:    Merrie Roof Referring Phys: [id number] Kayleen Memos  Sonographer Comments: This was a limited echo for LV function IMPRESSIONS  1. Limited study for LV function; limited views and no doppler performed; global hypokinesis and akinesis of the inferolateral wall with overall moderate to severe LV dysfunction.  2. Left ventricular ejection fraction, by estimation, is 30 to 35%. The left ventricle has moderate to severely decreased function. The left ventricle demonstrates regional wall motion abnormalities (see scoring diagram/findings for description). The left ventricular internal cavity size was moderately dilated. There is  mild left ventricular hypertrophy.  3. Right ventricular systolic function is normal. The right ventricular size is normal.  4. Left atrial size was mildly dilated.  5. Aortic dilatation noted. There is mild dilatation of the aortic root, measuring 41 mm. FINDINGS  Left Ventricle: Left ventricular ejection fraction, by estimation, is 30 to 35%. The left ventricle has moderate to severely decreased function. The left ventricle demonstrates regional wall motion abnormalities. The left ventricular internal cavity size was moderately dilated. There is mild left ventricular hypertrophy. Right Ventricle: The right ventricular size is normal. Right ventricular systolic function is normal. Left Atrium: Left atrial size was mildly dilated. Right Atrium: Right atrial size was normal in size. Pericardium: There is no evidence of pericardial effusion. Aorta: Aortic dilatation noted. There is mild dilatation of the aortic root, measuring 41 mm. Additional Comments: Limited study for LV function; limited views and no doppler performed; global hypokinesis and akinesis of the inferolateral wall with overall moderate to severe LV dysfunction.  LV Volumes (MOD) LV vol d, MOD A2C:  164.0 ml LV vol d, MOD A4C: 166.0 ml LV vol s, MOD A2C: 103.0 ml LV vol s, MOD A4C: 98.7 ml LV SV MOD A2C:     61.0 ml LV SV MOD A4C:     166.0 ml LV SV MOD BP:      65.4 ml Kirk Ruths MD Electronically signed by Kirk Ruths MD Signature Date/Time: 03/30/2021/6:06:49 PM    Final     Lab Results: Personally reviewed CBC    Component Value Date/Time   WBC 9.9 03/30/2021 0444   RBC 5.18 (H) 03/30/2021 0444   HGB 15.3 (H) 04/01/2021 1434   HCT 45.0 04/01/2021 1434   PLT 203 03/30/2021 0444   MCV 85.9 03/30/2021 0444   MCH 27.0 03/30/2021 0444   MCHC 31.5 03/30/2021 0444   RDW 15.1 03/30/2021 0444    BMET    Component Value Date/Time   NA 139 04/08/2021 0000   K 4.4 04/08/2021 0000   CL 101 04/08/2021 0000   CO2 24 04/08/2021 0000    GLUCOSE 76 04/08/2021 0000   GLUCOSE 94 04/03/2021 0140   BUN 17 04/08/2021 0000   CREATININE 1.06 (H) 04/08/2021 0000   CALCIUM 9.4 04/08/2021 0000   GFRNONAA 58 (L) 04/03/2021 0140    BNP    Component Value Date/Time   BNP 336.1 (H) 03/29/2021 2230    ProBNP No results found for: PROBNP  Specialty Problems       Pulmonary Problems   DOE (dyspnea on exertion)   Asthma       Allergies  Allergen Reactions   Clarithromycin Hives    GI upset   Penicillins Rash and Hives   Whey     Unknown reaction - showed on allergy test    Immunization History  Administered Date(s) Administered   PFIZER(Purple Top)SARS-COV-2 Vaccination 07/05/2020, 07/29/2020    Past Medical History:  Diagnosis Date   Asthma    Depression    Elevated brain natriuretic peptide (BNP) level    Hypertension    Lymphedema    SOB (shortness of breath)     Tobacco History: Social History   Tobacco Use  Smoking Status Passive Smoke Exposure - Never Smoker  Smokeless Tobacco Never Used   Counseling given: Not Answered   Continue to not smoke  Outpatient Encounter Medications as of 04/27/2021  Medication Sig   Budeson-Glycopyrrol-Formoterol (BREZTRI AEROSPHERE) 160-9-4.8 MCG/ACT AERO Inhale 2 puffs into the lungs in the morning and at bedtime.   albuterol (PROVENTIL) (5 MG/ML) 0.5% nebulizer solution Take 0.5 mLs (2.5 mg total) by nebulization every 6 (six) hours as needed for wheezing or shortness of breath.   albuterol (VENTOLIN HFA) 108 (90 Base) MCG/ACT inhaler Inhale 1-2 puffs into the lungs every 6 (six) hours as needed for wheezing or shortness of breath.   ARIPiprazole (ABILIFY) 10 MG tablet Take 10 mg by mouth daily.   aspirin 81 MG chewable tablet Chew 1 tablet (81 mg total) by mouth daily.   budesonide-formoterol (SYMBICORT) 160-4.5 MCG/ACT inhaler Inhale 2 puffs into the lungs 2 (two) times daily.   fluticasone (FLONASE) 50 MCG/ACT nasal spray Place 2 sprays into both nostrils  daily. (Patient taking differently: Place 2 sprays into both nostrils daily as needed for allergies.)   furosemide (LASIX) 40 MG tablet Take 1 tablet (40 mg total) by mouth daily.   hydrOXYzine (ATARAX/VISTARIL) 10 MG tablet Take 10 mg by mouth 2 (two) times daily.   ibuprofen (ADVIL) 600 MG tablet Take 600 mg by  mouth daily as needed for mild pain.   ipratropium-albuterol (DUONEB) 0.5-2.5 (3) MG/3ML SOLN Take 3 mLs by nebulization every 6 (six) hours as needed (wheezing, shortness of breath).   metoprolol succinate (TOPROL-XL) 25 MG 24 hr tablet Take 1 tablet (25 mg total) by mouth daily.   montelukast (SINGULAIR) 10 MG tablet Take 1 tablet (10 mg total) by mouth at bedtime.   sacubitril-valsartan (ENTRESTO) 24-26 MG Take 1 tablet by mouth 2 (two) times daily.   spironolactone (ALDACTONE) 25 MG tablet Take 0.5 tablets (12.5 mg total) by mouth daily.   traZODone (DESYREL) 100 MG tablet Take 100 mg by mouth at bedtime.   No facility-administered encounter medications on file as of 04/27/2021.     Review of Systems  Review of Systems  No chest pain exertion.  No orthopnea or PND.  Lower extremity swelling improving.  Comprehensive review of systems otherwise negative. Physical Exam  BP 116/82   Pulse 73   Ht 5\' 8"  (1.727 m)   Wt 278 lb (126.1 kg)   SpO2 99%   BMI 42.27 kg/m   Wt Readings from Last 5 Encounters:  04/27/21 278 lb (126.1 kg)  04/03/21 276 lb 12.8 oz (125.6 kg)  03/21/21 273 lb (123.8 kg)  01/12/21 279 lb (126.6 kg)  07/28/20 291 lb 6.4 oz (132.2 kg)    BMI Readings from Last 5 Encounters:  04/27/21 42.27 kg/m  04/03/21 42.09 kg/m  03/21/21 42.05 kg/m  01/12/21 42.98 kg/m  07/28/20 44.31 kg/m     Physical Exam General: Sitting in chair, no acute distress Eyes: EOMI, no icterus Neck: Supple, no JVP appreciated Cardiovascular: Regular rate and rhythm, no murmurs Pulmonary: Clear to oscillation bilaterally, no wheezes or crackles, normal work of  breathing Abdomen: Nondistended, bowel sounds present MSK: No synovitis, no joint effusion Neuro: Normal gait, no weakness Psych: Normal, full affect   Assessment & Plan:   DOE: Largely related to volume overload much improved on diuretics. Component of deconditioning, weight, asthma as well.   Asthma: Atopic symptoms. 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 Escalate to [facility] for additional bronchodilation. New script today.    Return in about 3 months (around 07/28/2021).   Lanier Clam, MD 04/27/2021

## 2021-04-27 NOTE — Patient Instructions (Addendum)
Nice to meet you!  I recommend we try Breztri 2 puffs twice a day. You can stop the symbicort once you start this. If this is too expensive let me know and we can try to get creative. Rinse mouth out after every use. Use your spacer - sorry we do not have any in the office today.  We can get breathing tests (PFTs or pulmonary function tests) if things are not improving in the future.  Return to clinic for follow up with Dr Silas Flood in 3 months

## 2021-05-06 ENCOUNTER — Ambulatory Visit: Payer: BC Managed Care – PPO | Admitting: Family

## 2021-05-06 ENCOUNTER — Encounter: Payer: Self-pay | Admitting: Family

## 2021-05-06 ENCOUNTER — Other Ambulatory Visit: Payer: Self-pay

## 2021-05-06 VITALS — BP 112/80 | Ht 68.0 in | Wt 278.0 lb

## 2021-05-06 DIAGNOSIS — I5020 Unspecified systolic (congestive) heart failure: Secondary | ICD-10-CM

## 2021-05-06 DIAGNOSIS — F1910 Other psychoactive substance abuse, uncomplicated: Secondary | ICD-10-CM | POA: Diagnosis not present

## 2021-05-06 DIAGNOSIS — I428 Other cardiomyopathies: Secondary | ICD-10-CM | POA: Diagnosis not present

## 2021-05-06 DIAGNOSIS — J45909 Unspecified asthma, uncomplicated: Secondary | ICD-10-CM

## 2021-05-06 MED ORDER — FUROSEMIDE 20 MG PO TABS: 20.0000 mg | ORAL_TABLET | Freq: Every day | ORAL | 5 refills | Status: DC

## 2021-05-06 MED ORDER — SACUBITRIL-VALSARTAN 24-26 MG PO TABS: 1.0000 | ORAL_TABLET | Freq: Two times a day (BID) | ORAL | 5 refills | Status: DC

## 2021-05-06 MED ORDER — DAPAGLIFLOZIN PROPANEDIOL 10 MG PO TABS: 10.0000 mg | ORAL_TABLET | Freq: Every day | ORAL | 5 refills | Status: DC

## 2021-05-06 MED ORDER — METOPROLOL SUCCINATE ER 25 MG PO TB24: 25.0000 mg | ORAL_TABLET | Freq: Every day | ORAL | 0 refills | Status: DC

## 2021-05-06 MED ORDER — SPIRONOLACTONE 25 MG PO TABS: 25.0000 mg | ORAL_TABLET | Freq: Every day | ORAL | 5 refills | Status: DC

## 2021-05-06 NOTE — Progress Notes (Signed)
Office Visit    Patient Name: Janet [name] Date of Encounter: 05/06/2021  PCP:  Camillia Herter, NP   [place]  Cardiologist:  Mertie Moores, MD  Advanced Practice Provider:  No care team member to display Electrophysiologist:  None   Chief Complaint    Janet [name] is a 55 y.o. adult with a hx of athma, HTN, depression, morbid obesity, polysubstance abuse, chronic systolic heart failure presents today for follow up after hospitalization for heart failure.    Past Medical History    Past Medical History:  Diagnosis Date   Asthma    Depression    Elevated brain natriuretic peptide (BNP) level    Hypertension    Lymphedema    SOB (shortness of breath)    Past Surgical History:  Procedure Laterality Date   ABDOMINAL HYSTERECTOMY     CESAREAN SECTION     HERNIA REPAIR     RIGHT/LEFT HEART CATH AND CORONARY ANGIOGRAPHY N/A 04/01/2021   Procedure: RIGHT/LEFT HEART CATH AND CORONARY ANGIOGRAPHY;  Surgeon: Jettie Booze, MD;  Location: [facility];  Service: Cardiovascular;  Laterality: N/A;   TUBAL LIGATION      Allergies  Allergies  Allergen Reactions   Clarithromycin Hives    GI upset   Penicillins Rash and Hives   Whey     Unknown reaction - showed on allergy test    History of Present Illness    Janet [name] is a 55 y.o. adult with a hx of athma, HTN, depression, morbid obesity, polysubstance abuse, chronic systolic heart failure last seen while hospitalized.  Previously evaluated September 2021 due to dyspnea on exertion.  Echo at that time showed LVEF 46%, mild LVH, grade 2 diastolic dysfunction.  She was recommended to continue work on diet, exercise weight loss and continue losartan and hydrochlorothiazide.  She was recommended for 8-month follow-up which was not performed.  Hospitalized 03/29/2021 did progressive shortness of breath.  She was treated for acute asthma exacerbation as well as  community-acquired pneumonia.  She was diagnosed with new onset systolic congestive heart failure with LVEF 30 to 35% and LHC with no findings of coronary artery disease.  She was discharged on metoprolol XL, Entresto, spironolactone, furosemide.  Her marijuana as well as crack cocaine use were thought to be contributory as well as hypertension and obesity.  She presents today for follow-up.  She presently lives with her daughter and 54-year-old granddaughter.  She is in school at [employer] studying liberal studies with concentration and culture.  She graduates next year. We reviewed hospitalization, testing, and medications in depth. She reports feeling winded both at rest and with exertion.  This is overall improving since hospital discharge.. She has not yet started her Judithann Sauger which was recently prescribed by pulmonology.  No orthopnea, PND. No chest pain, pressure, tightness. She has quit drinking and not been using cocaine. She is working to decrease her marijuana. Tells me she has made significant changes and is not planning to see previous friends who encouraged substance abuse. She is very motivated to strengthen her heart. Long discussion regarding heart failure lifestyle changes, diet, fluid restriction, exercise, signs of worsening heart failure.   EKGs/Labs/Other Studies Reviewed:   The following studies were reviewed today: [facility] 8/67/67 LV end diastolic pressure is moderately elevated. There is no aortic valve stenosis. Hemodynamic findings consistent with mild pulmonary hypertension. Ao sat 99%, PA sat 73%, mean PA pressure 33 mmHg, mean PCWP 22 mmHg, cardiac  output 6.2 L/min, cardiac index 2.6. No angiographically apparent coronary artery disease. Significant tortuosity noted in the RCA and the circumflex.   Continue diuresis.  She will need aggressive medical therapy for nonischemic cardiomyopathy Echo 03/30/21 1. Limited study for LV function; limited views and no doppler performed;   global hypokinesis and akinesis of the inferolateral wall with overall  moderate to severe LV dysfunction.   2. Left ventricular ejection fraction, by estimation, is 30 to 35%. The  left ventricle has moderate to severely decreased function. The left  ventricle demonstrates regional wall motion abnormalities (see scoring  diagram/findings for description). The  left ventricular internal cavity size was moderately dilated. There is  mild left ventricular hypertrophy.   3. Right ventricular systolic function is normal. The right ventricular  size is normal.   4. Left atrial size was mildly dilated.   5. Aortic dilatation noted. There is mild dilatation of the aortic root,  measuring 41 mm.   EKG:  No EKG today   Recent Labs: 01/12/2021: ALT 17; TSH 1.520 03/29/2021: B Natriuretic Peptide 336.1 03/30/2021: Magnesium 2.1; Platelets 203 04/01/2021: Hemoglobin 15.3 04/08/2021: BUN 17; Creatinine, Ser 1.06; Potassium 4.4; Sodium 139  Recent Lipid Panel    Component Value Date/Time   CHOL 170 01/12/2021 1127   TRIG 95 01/12/2021 1127   HDL 74 01/12/2021 1127   CHOLHDL 2.3 01/12/2021 1127   LDLCALC 79 01/12/2021 1127   Home Medications   Current Meds  Medication Sig   albuterol (PROVENTIL) (5 MG/ML) 0.5% nebulizer solution Take 0.5 mLs (2.5 mg total) by nebulization every 6 (six) hours as needed for wheezing or shortness of breath.   albuterol (VENTOLIN HFA) 108 (90 Base) MCG/ACT inhaler Inhale 1-2 puffs into the lungs every 6 (six) hours as needed for wheezing or shortness of breath.   ARIPiprazole (ABILIFY) 10 MG tablet Take 10 mg by mouth daily.   aspirin 81 MG chewable tablet Chew 1 tablet (81 mg total) by mouth daily.   Budeson-Glycopyrrol-Formoterol (BREZTRI AEROSPHERE) 160-9-4.8 MCG/ACT AERO Inhale 2 puffs into the lungs in the morning and at bedtime.   budesonide-formoterol (SYMBICORT) 160-4.5 MCG/ACT inhaler Inhale 2 puffs into the lungs 2 (two) times daily.   dapagliflozin  propanediol (FARXIGA) 10 MG TABS tablet Take 1 tablet (10 mg total) by mouth daily.   fluticasone (FLONASE) 50 MCG/ACT nasal spray Place 2 sprays into both nostrils daily.   hydrOXYzine (ATARAX/VISTARIL) 10 MG tablet Take 10 mg by mouth 2 (two) times daily.   ibuprofen (ADVIL) 600 MG tablet Take 600 mg by mouth daily as needed for mild pain.   ipratropium-albuterol (DUONEB) 0.5-2.5 (3) MG/3ML SOLN Take 3 mLs by nebulization every 6 (six) hours as needed (wheezing, shortness of breath).   montelukast (SINGULAIR) 10 MG tablet Take 1 tablet (10 mg total) by mouth at bedtime.   traZODone (DESYREL) 100 MG tablet Take 100 mg by mouth at bedtime.   [DISCONTINUED] furosemide (LASIX) 40 MG tablet Take 1 tablet (40 mg total) by mouth daily.   [DISCONTINUED] metoprolol succinate (TOPROL-XL) 25 MG 24 hr tablet Take 1 tablet (25 mg total) by mouth daily.   [DISCONTINUED] sacubitril-valsartan (ENTRESTO) 24-26 MG Take 1 tablet by mouth 2 (two) times daily.   [DISCONTINUED] spironolactone (ALDACTONE) 25 MG tablet Take 0.5 tablets (12.5 mg total) by mouth daily. (Patient taking differently: Take 25 mg by mouth daily.)     Review of Systems   All other systems reviewed and are otherwise negative except as noted above.  Physical Exam    VS:  BP 112/80 (BP Location: Left Arm, Patient Position: Sitting, Cuff Size: Normal)   Ht 5\' 8"  (1.727 m)   Wt 278 lb (126.1 kg)   SpO2 95%   BMI 42.27 kg/m  , BMI Body mass index is 42.27 kg/m.  Wt Readings from Last 3 Encounters:  05/06/21 278 lb (126.1 kg)  04/27/21 278 lb (126.1 kg)  04/03/21 276 lb 12.8 oz (125.6 kg)    GEN: Well nourished, overweight, well developed, in no acute distress. HEENT: normal. Neck: Supple, no JVD, carotid bruits, or masses. Cardiac: RRR, no murmurs, rubs, or gallops. No clubbing, cyanosis, edema.  Radials/PT 2+ and equal bilaterally.  Respiratory:  Respirations regular and unlabored, clear to auscultation bilaterally. GI: Soft,  nontender, nondistended. MS: No deformity or atrophy. Skin: Warm and dry, no rash. Neuro:  Strength and sensation are intact. Psych: Normal affect.  Assessment & Plan    HFrEF / NICM - Recent admission and echo 03/30/21 LVEF 30-35%, LHC with no CAD. Dyspnea overall improving. Volume status difficult to ascertain due to obesity. NYHA III-III with DOE. Referred to cardiac rehab. Repeat echo 3 months to reassess LVEF. If LVEF not >35% at that time ,consider referral to EP for ICD. GDMT includes Entresto, Lasix, Spironolactone, Toprol. Start Farxiga 10mg  daily. Reduce Lasix to 20mg  daily. BMP in 1-2 weeks for monitoring. Educated on low sodium diet, <2 L fluid restriction, daily weight. Will report weight gain of 2 pounds overnight or 5 pounds in 1 week.  Asthma - Continue to follow with pulmonology. Encouraged to start [facility] as prescribed. Has samples from pulmonology. Provided co-pay coupon card.  Polysubstance use - Has stopped alcohol, cocaine and congratulated. Still working on marijuana cessation. Complete cessation encouraged. She follows closely with therapy who are providing additional support.   Obesity - Weight loss via diet and exercise encouraged. Discussed the impact being overweight would have on cardiovascular risk and heart failure.  Disposition: Follow up in 3 month(s) with Dr. Acie Fredrickson or APP with echocardiogram prior   Signed, Loel Dubonnet, NP 05/06/2021, 4:50 PM [place]

## 2021-05-06 NOTE — Patient Instructions (Addendum)
Medication Instructions:  Your physician has recommended you make the following change in your medication:  CONTINUE Spironolactone 25mg  one tablet daily  START Dapaglifozin Wilder Glade) 10mg  daily  CHANGE Furosemide (Lasix) to 20mg  daily  *If you need a refill on your cardiac medications before your next appointment, please call your pharmacy*   Lab Work: Your physician recommends that you return for lab work in 1-2 weeks for BMP.   If you have labs (blood work) drawn today and your tests are completely normal, you will receive your results only by: [place] (if you have MyChart) OR A paper copy in the mail If you have any lab test that is abnormal or we need to change your treatment, we will call you to review the results.   Testing/Procedures: Your provider has requested that you have an echocardiogram in 3 months. Echocardiography is a painless test that uses sound waves to create images of your heart. It provides your doctor with information about the size and shape of your heart and how well your heart's chambers and valves are working. This procedure takes approximately one hour. There are no restrictions for this procedure.   Follow-Up: At [facility], you and your health needs are our priority.  As part of our continuing mission to provide you with exceptional heart care, we have created designated Provider Care Teams.  These Care Teams include your primary Cardiologist (physician) and Advanced Practice Providers (APPs -  Physician Assistants and Nurse Practitioners) who all work together to provide you with the care you need, when you need it.  We recommend signing up for the patient portal called "MyChart".  Sign up information is provided on this After Visit Summary.  MyChart is used to connect with patients for Virtual Visits (Telemedicine).  Patients are able to view lab/test results, encounter notes, upcoming appointments, etc.  Non-urgent messages can be sent to  your provider as well.   To learn more about what you can do with MyChart, go to [email or web address].    Your next appointment:   3 month(s)  The format for your next appointment:   In Person  Provider:   You may see Dr. Liam Rogers, or one of the following Advanced Practice Providers on your designated Care Team:   Richardson Dopp, PA-C Robbie Lis, [place]   Other Instructions  You have been referred to cardiac rehab. This is a combination program including monitored exercise, dietary education, and support group. We strongly recommend participating in the program. Expect a phone call from them in approximately 2-3 weeks. If you do not hear from them, the phone number for cardiac rehab at [facility] is [phone].   Recommend drinking less than 2 liters of fluid per day. This is about 3.5 sixteen ounce bottles of water per day.   Heart Healthy Diet Recommendations: A low-salt diet is recommended. Meats should be grilled, baked, or boiled. Avoid fried foods. Focus on lean protein sources like fish or chicken with vegetables and fruits. The American Heart Association is a Microbiologist!  American Heart Association Diet and Lifeystyle Recommendations   Exercise recommendations: The American Heart Association recommends 150 minutes of moderate intensity exercise weekly. Try 30 minutes of moderate intensity exercise 4-5 times per week. This could include walking, jogging, or swimming.

## 2021-05-11 ENCOUNTER — Encounter: Payer: BC Managed Care – PPO | Admitting: Gastroenterology

## 2021-05-20 ENCOUNTER — Other Ambulatory Visit: Payer: BC Managed Care – PPO

## 2021-05-20 ENCOUNTER — Telehealth (HOSPITAL_COMMUNITY): Payer: Self-pay

## 2021-05-20 NOTE — Telephone Encounter (Signed)
Pt insurance is active and benefits verified through BCBS Co-pay 0, DED $500/$500 met, out of pocket $4,000/$4,000 met, 20% co-insurance . no pre-authorization required. Passport, 05/20/2021_0 :22am, REF# [id number]   Will contact patient to see if she is interested in the Cardiac Rehab Program. If interested, patient will need to complete follow up appt. Once completed, patient will be contacted for scheduling upon review by the RN Navigator.

## 2021-05-20 NOTE — Telephone Encounter (Signed)
Called patient to see if she is interested in the Cardiac Rehab Program. Patient expressed interest. Explained scheduling process and went over insurance, patient verbalized understanding. Will contact patient for scheduling at a later date.

## 2021-05-24 NOTE — Telephone Encounter (Signed)
Called and spoke with pt in regards to CR, pt stated she had a lot of things going on and is not interested at this time.   Closed referral

## 2021-05-26 ENCOUNTER — Other Ambulatory Visit: Payer: Self-pay

## 2021-05-26 ENCOUNTER — Other Ambulatory Visit: Payer: BC Managed Care – PPO

## 2021-05-26 DIAGNOSIS — I5020 Unspecified systolic (congestive) heart failure: Secondary | ICD-10-CM

## 2021-05-26 DIAGNOSIS — I428 Other cardiomyopathies: Secondary | ICD-10-CM

## 2021-05-27 LAB — BASIC METABOLIC PANEL
BUN/Creatinine Ratio: 12 (ref 9–23)
BUN: 13 mg/dL (ref 6–24)
CO2: 22 mmol/L (ref 20–29)
Calcium: 9.4 mg/dL (ref 8.7–10.2)
Chloride: 104 mmol/L (ref 96–106)
Creatinine, Ser: 1.11 mg/dL — ABNORMAL HIGH (ref 0.57–1.00)
Glucose: 92 mg/dL (ref 65–99)
Potassium: 4.7 mmol/L (ref 3.5–5.2)
Sodium: 140 mmol/L (ref 134–144)
eGFR: 59 mL/min/{1.73_m2} — ABNORMAL LOW (ref >59)

## 2021-06-01 ENCOUNTER — Other Ambulatory Visit: Payer: Self-pay

## 2021-06-01 ENCOUNTER — Ambulatory Visit (INDEPENDENT_AMBULATORY_CARE_PROVIDER_SITE_OTHER): Payer: BC Managed Care – PPO | Admitting: Gastroenterology

## 2021-06-01 ENCOUNTER — Encounter: Payer: Self-pay | Admitting: Gastroenterology

## 2021-06-01 VITALS — BP 130/70 | HR 71 | Ht 68.0 in | Wt 279.0 lb

## 2021-06-01 DIAGNOSIS — I5022 Chronic systolic (congestive) heart failure: Secondary | ICD-10-CM | POA: Diagnosis not present

## 2021-06-01 DIAGNOSIS — Z1211 Encounter for screening for malignant neoplasm of colon: Secondary | ICD-10-CM | POA: Diagnosis not present

## 2021-06-01 NOTE — Progress Notes (Signed)
HPI :  55 y/o female with a history of CHF, HTN, lymphedema, referred by Durene Fruits for discussion of screening colonoscopy.   Patient has never had a prior colonoscopy. She denies any problems with her bowel habits at baseline.  Occasionally if she drinks too much alcohol she will have some stool that is a bit greasy/oily but this is not a routine occurrence.  She drinks alcohol about 2 times per week..  She denies any blood in her stools.  No family history of colon cancer.  No abdominal pains that bother her.  She unfortunately was hospitalized on 03/29/2021 for progressive shortness of breath.  She was treated for asthma exacerbation as well as community-acquired pneumonia.  During this time she was diagnosed with new onset systolic congestive heart failure with LVEF 30 to 35% and had a cardiac cath with no significant coronary artery disease.  She was treated with metoprolol, Entresto, spironolactone, furosemide.  Crack cocaine use were thought to be contributory as well as hypertension and obesity. She has made efforts to stop using cocaine. Per review of cardiology note on 6/17 they plan to repeat an echo in September to reassess her EF.  She states she is feeling much better since her hospitalization. Breathing is significantly improved. No chest pains. She denies any problems with anesthesia in the past.    Past Medical History:  Diagnosis Date   Asthma    CHF (congestive heart failure) (HCC)    Congestive heart failure (CHF) (HCC)    Depression    Elevated brain natriuretic peptide (BNP) level    Hypertension    Lymphedema    SOB (shortness of breath)      Past Surgical History:  Procedure Laterality Date   ABDOMINAL HYSTERECTOMY     CESAREAN SECTION     HERNIA REPAIR     umbilical as a child   RIGHT/LEFT HEART CATH AND CORONARY ANGIOGRAPHY N/A 04/01/2021   Procedure: RIGHT/LEFT HEART CATH AND CORONARY ANGIOGRAPHY;  Surgeon: Jettie Booze, MD;  Location: [facility];  Service: Cardiovascular;  Laterality: N/A;   TUBAL LIGATION     Family History  Problem Relation Age of Onset   Hypertension Mother    Stroke Mother    Deep vein thrombosis Mother    Congestive Heart Failure Mother    Other Father        history unknown   Obesity Sister    Obesity Brother    Congestive Heart Failure Maternal Grandmother    Social History   Tobacco Use   Smoking status: Never    Passive exposure: Yes   Smokeless tobacco: Never  Vaping Use   Vaping Use: Never used  Substance Use Topics   Alcohol use: Yes    Comment: occasional   Drug use: Not Currently    Types: Marijuana, "Crack" cocaine    Comment: uses marijuana occ   Current Outpatient Medications  Medication Sig Dispense Refill   albuterol (PROVENTIL) (5 MG/ML) 0.5% nebulizer solution Take 0.5 mLs (2.5 mg total) by nebulization every 6 (six) hours as needed for wheezing or shortness of breath. 20 mL 1   albuterol (VENTOLIN HFA) 108 (90 Base) MCG/ACT inhaler Inhale 1-2 puffs into the lungs every 6 (six) hours as needed for wheezing or shortness of breath. 1 each 1   ARIPiprazole (ABILIFY) 10 MG tablet Take 10 mg by mouth daily.     aspirin 81 MG chewable tablet Chew 1 tablet (81 mg total) by  mouth daily. 90 tablet 0   Budeson-Glycopyrrol-Formoterol (BREZTRI AEROSPHERE) 160-9-4.8 MCG/ACT AERO Inhale 2 puffs into the lungs in the morning and at bedtime. 10.7 g 0   dapagliflozin propanediol (FARXIGA) 10 MG TABS tablet Take 1 tablet (10 mg total) by mouth daily. 30 tablet 5   fluticasone (FLONASE) 50 MCG/ACT nasal spray Place 2 sprays into both nostrils daily. 16 g 0   furosemide (LASIX) 20 MG tablet Take 1 tablet (20 mg total) by mouth daily. 30 tablet 5   hydrOXYzine (ATARAX/VISTARIL) 10 MG tablet Take 10 mg by mouth 2 (two) times daily.     ibuprofen (ADVIL) 600 MG tablet Take 600 mg by mouth daily as needed for mild pain.     ipratropium-albuterol (DUONEB) 0.5-2.5 (3) MG/3ML SOLN Take 3  mLs by nebulization every 6 (six) hours as needed (wheezing, shortness of breath). 360 mL 0   metoprolol succinate (TOPROL-XL) 25 MG 24 hr tablet Take 1 tablet (25 mg total) by mouth daily. 90 tablet 0   montelukast (SINGULAIR) 10 MG tablet Take 1 tablet (10 mg total) by mouth at bedtime. 90 tablet 0   sacubitril-valsartan (ENTRESTO) 24-26 MG Take 1 tablet by mouth 2 (two) times daily. 30 tablet 5   spironolactone (ALDACTONE) 25 MG tablet Take 1 tablet (25 mg total) by mouth daily. 30 tablet 5   traZODone (DESYREL) 100 MG tablet Take 100 mg by mouth at bedtime.     No current facility-administered medications for this visit.   Allergies  Allergen Reactions   Clarithromycin Hives    GI upset   Penicillins Rash and Hives   Whey     Unknown reaction - showed on allergy test     Review of Systems: All systems reviewed and negative except where noted in HPI.   Lab Results  Component Value Date   WBC 9.9 03/30/2021   HGB 15.3 (H) 04/01/2021   HCT 45.0 04/01/2021   MCV 85.9 03/30/2021   PLT 203 03/30/2021    Lab Results  Component Value Date   CREATININE 1.11 (H) 05/26/2021   BUN 13 05/26/2021   NA 140 05/26/2021   K 4.7 05/26/2021   CL 104 05/26/2021   CO2 22 05/26/2021    Lab Results  Component Value Date   ALT 17 01/12/2021   AST 18 01/12/2021   ALKPHOS 63 01/12/2021   BILITOT 0.4 01/12/2021     Physical Exam: BP 130/70   Pulse 71   Ht 5\' 8"  (1.727 m)   Wt 279 lb (126.6 kg)   BMI 42.42 kg/m  Constitutional: Pleasant,well-developed, female in no acute distress. Cardiovascular: Normal rate, regular rhythm.  Pulmonary/chest: Effort normal and breath sounds normal.  Abdominal: Soft, nondistended, protuberant, nontender. There are no masses palpable.  Extremities: no edema Lymphadenopathy: No cervical adenopathy noted. Neurological: Alert and oriented to person place and time. Skin: Skin is warm and dry. No rashes noted. Psychiatric: Normal mood and affect.  Behavior is normal.   ASSESSMENT AND PLAN: 55 year old female here for new patient assessment following:  Colon cancer screening Congestive heart failure  The patient is asymptomatic from GI perspective but overdue for colon cancer screening.  I discussed options for colon cancer screening with her to include optical colonoscopy vs. stool based testing, discussed differences between the two.  Ideally recommend optical colonoscopy if she can tolerate it.  She recently was admitted for new onset CHF but is feeling much better with medical therapy.  With her current ejection fraction of  less than 35% she is higher than average risk for anesthesia and her case would need to be done at the hospital if we proceeded now.  Recommend she continue with medical therapy by her cardiologist for CHF and await echocardiogram in September.  If her ejection fraction is improved at that time and greater than 35% we can proceed with exam at our office which will be cheaper for her.  Hopefully with therapy her ejection fraction significantly improves.  If not we will consider doing the procedure at the hospital or consider stool-based testing, however after discussion as above she wishes to proceed with optical colonoscopy if at all possible.  We will await her echocardiogram in September and discuss scheduling after that has been completed.  In the interim she should minimize alcohol / substance use as she has been doing.  Plan: - optical colonoscopy after patient has been optimized in regards to her cardiac function - we will await echocardiogram in September and if it looks improved then we will proceed with colonoscopy at [facility] - if ejection fraction has not improved in September we will discuss whether or not she wants to do colonoscopy at the hospital versus stool based testing  [place] Cellar, MD [facility]  CC: Camillia Herter, NP

## 2021-06-01 NOTE — Patient Instructions (Signed)
If you are age 54 or older, your body mass index should be between 23-30. Your Body mass index is 42.42 kg/m. If this is out of the aforementioned range listed, please consider follow up with your Primary Care Provider.  If you are age 60 or younger, your body mass index should be between 19-25. Your Body mass index is 42.42 kg/m. If this is out of the aformentioned range listed, please consider follow up with your Primary Care Provider.   __________________________________________________________  The [facility] providers would like to encourage you to use [facility] to communicate with providers for non-urgent requests or questions.  Due to long hold times on the telephone, sending your provider a message by [facility] may be a faster and more efficient way to get a response.  Please allow 48 business hours for a response.  Please remember that this is for non-urgent requests.    We will await your ECHO in September.  If your EF is greater than 35% we can discuss scheduling your colonoscopy at our [place].  Thank you for entrusting me with your care and for choosing [facility], Dr. Grover Cellar

## 2021-06-08 ENCOUNTER — Telehealth: Payer: Self-pay | Admitting: Pulmonary Disease

## 2021-06-08 MED ORDER — BREZTRI AEROSPHERE 160-9-4.8 MCG/ACT IN AERO: 2.0000 | INHALATION_SPRAY | Freq: Two times a day (BID) | RESPIRATORY_TRACT | 5 refills | Status: DC

## 2021-06-08 NOTE — Telephone Encounter (Signed)
Called and spoke with patient. She stated that the Janet [name] has worked well for her and she would like to have a prescription sent to her pharmacy. Verified pharmacy. I advised her that I would go ahead and send in the RX, she verbalized understanding.   Nothing further needed at time of call.

## 2021-07-12 ENCOUNTER — Ambulatory Visit: Payer: BC Managed Care – PPO

## 2021-07-27 ENCOUNTER — Other Ambulatory Visit (HOSPITAL_COMMUNITY): Payer: BC Managed Care – PPO

## 2021-07-28 ENCOUNTER — Ambulatory Visit: Payer: BC Managed Care – PPO | Admitting: Pulmonary Disease

## 2021-07-29 ENCOUNTER — Encounter (HOSPITAL_COMMUNITY): Payer: Self-pay | Admitting: Family

## 2021-07-29 ENCOUNTER — Other Ambulatory Visit (HOSPITAL_COMMUNITY): Payer: Medicaid Other

## 2021-07-29 ENCOUNTER — Other Ambulatory Visit: Payer: Self-pay | Admitting: *Deleted

## 2021-07-29 DIAGNOSIS — I5020 Unspecified systolic (congestive) heart failure: Secondary | ICD-10-CM

## 2021-07-29 DIAGNOSIS — I428 Other cardiomyopathies: Secondary | ICD-10-CM

## 2021-07-29 MED ORDER — SACUBITRIL-VALSARTAN 24-26 MG PO TABS: 1.0000 | ORAL_TABLET | Freq: Two times a day (BID) | ORAL | 5 refills | Status: AC

## 2021-08-03 ENCOUNTER — Ambulatory Visit: Payer: Medicaid Other | Admitting: Cardiovascular Disease

## 2021-08-04 ENCOUNTER — Telehealth: Payer: Self-pay

## 2021-08-04 NOTE — Telephone Encounter (Signed)
**Note De-Identified  Obfuscation** We received a Entresto PA request from [facility] but the pts ID # on the request does not match the ID on the pts BCBS card we have on file so I called [facility] and was advised (after comparing the ins info they have and the ins info we have on the pt) that they have 2 different cards for the pt and neither of them match the card we have.  The pharmacist asked that when I s/w the pt to ask her to bring her correct Ins card to them as the 2 cards they have are not paying much at all on the pts Entresto but are not requiring a PA but is rejecting as not covered by plan.  I called the pt but got no answer so I left a message on her VM asking her to call Jeani Hawking back at [facility] at [phone].

## 2021-08-08 ENCOUNTER — Telehealth: Payer: Self-pay

## 2021-08-08 NOTE — Telephone Encounter (Signed)
Called and left message for patient to call back to discuss scheduling of her ECL.  Has she/will she be rescheduling her ECHO?

## 2021-08-08 NOTE — Telephone Encounter (Signed)
-----   Message from Roetta Sessions, [place] sent at 07/28/2021  1:27 PM EDT ----- Regarding: FW: ECHO results? OK for lec? ECHO rescheduled to 9-9.  Schedule colonoscopy at [facility] if >35% If under, call patient and see if she would like to schedule at the hospital    ----- Message ----- From: Roetta Sessions, CMA Sent: 07/28/2021  12:00 PM EDT To: Roetta Sessions, CMA Subject: ECHO results                                   Patient is scheduled for ECHO on 9-7.  Have Dr. Loni Muse review. If over 35% can schedule at [facility].

## 2021-08-09 NOTE — Telephone Encounter (Signed)
Patient is returning call.  °

## 2021-08-10 ENCOUNTER — Telehealth: Payer: Self-pay | Admitting: Pulmonary Disease

## 2021-08-10 NOTE — Telephone Encounter (Signed)
**Note De-Identified  Obfuscation** The pt states that she has [employer]/medicaid for her medications. I called [facility] to get the information from the card they are running the pts Entresto under and was advised of the card information as follows:  ID: [id number] T BIN: [id number] PCN: [id number] No GRP #  [employer]: [phone]

## 2021-08-10 NOTE — Telephone Encounter (Signed)
**Note De-Identified  Obfuscation** I called [employer] at [phone] and did this Entresto PA over the phone with Jana Half. Per Rosann Auerbach they will have a determination within 24 hours and that we can call them back after then for their decision.  PA #: [id number]

## 2021-08-10 NOTE — Telephone Encounter (Signed)
Called and left detailed message to call back to discuss ECHO and getting her scheduled upstairs in the [facility] or at [facility]

## 2021-08-12 ENCOUNTER — Telehealth (HOSPITAL_COMMUNITY): Payer: Self-pay | Admitting: Family

## 2021-08-12 NOTE — Telephone Encounter (Signed)
Just an FYI. We have made several attempts to contact this patient including sending a letter to schedule or reschedule their echocardiogram. We will be removing the patient from the echo WQ.  07/29/21 NO SHOWED-MAILED LETTER LBW     Thank you

## 2021-08-15 NOTE — Telephone Encounter (Signed)
Thank you for the update. We can discuss at her November appointment.   Loel Dubonnet, NP

## 2021-08-30 NOTE — Telephone Encounter (Signed)
I have attempted to call [place] [employer] start the PA.  I called the pt to let her know that we have not been able to do the PA as it keeps kicking her insurance back.  She stated that she has medicaid and the number for this is   [phone] T  The number for [place] [employer] will not ring.  Will try back later.  [phone]  [place]  [phone]  Pt has tried symbicort 160 in the past.

## 2021-09-06 NOTE — Telephone Encounter (Signed)
Will forward message to the pharmacy team to see if they can help with this PA.

## 2021-09-07 ENCOUNTER — Other Ambulatory Visit (HOSPITAL_COMMUNITY): Payer: Self-pay

## 2021-09-07 NOTE — Telephone Encounter (Signed)
Ran test claim for Breztri inhaler and it's processes through for $4 co-pay. Called pharmacy ([facility]) and verified that it processed through for the same and it did. Called pt, but was unable to leave a message that medication is being filled at pharmacy with a $4 co-pay. Please try to reach out to pt.

## 2021-09-09 ENCOUNTER — Other Ambulatory Visit: Payer: Self-pay | Admitting: *Deleted

## 2021-09-09 DIAGNOSIS — J4540 Moderate persistent asthma, uncomplicated: Secondary | ICD-10-CM

## 2021-09-09 MED ORDER — MONTELUKAST SODIUM 10 MG PO TABS: 10.0000 mg | ORAL_TABLET | Freq: Every day | ORAL | 0 refills | Status: DC

## 2021-09-22 ENCOUNTER — Other Ambulatory Visit: Payer: Self-pay

## 2021-09-22 ENCOUNTER — Ambulatory Visit (HOSPITAL_COMMUNITY): Payer: Medicare HMO | Attending: Cardiovascular Disease

## 2021-09-22 DIAGNOSIS — I5020 Unspecified systolic (congestive) heart failure: Secondary | ICD-10-CM | POA: Diagnosis not present

## 2021-09-22 DIAGNOSIS — I428 Other cardiomyopathies: Secondary | ICD-10-CM

## 2021-09-22 LAB — ECHOCARDIOGRAM COMPLETE
Area-P 1/2: 2.99 cm2
P 1/2 time: 668 msec
S' Lateral: 4.9 cm

## 2021-09-29 DIAGNOSIS — H43811 Vitreous degeneration, right eye: Secondary | ICD-10-CM | POA: Diagnosis not present

## 2021-10-10 ENCOUNTER — Other Ambulatory Visit: Payer: Self-pay

## 2021-10-10 ENCOUNTER — Encounter: Payer: Self-pay | Admitting: Cardiovascular Disease

## 2021-10-10 ENCOUNTER — Ambulatory Visit (INDEPENDENT_AMBULATORY_CARE_PROVIDER_SITE_OTHER): Payer: Medicare HMO | Admitting: Cardiovascular Disease

## 2021-10-10 VITALS — BP 128/70 | HR 84 | Ht 68.0 in | Wt 280.2 lb

## 2021-10-10 DIAGNOSIS — I428 Other cardiomyopathies: Secondary | ICD-10-CM | POA: Diagnosis not present

## 2021-10-10 DIAGNOSIS — I5020 Unspecified systolic (congestive) heart failure: Secondary | ICD-10-CM | POA: Diagnosis not present

## 2021-10-10 DIAGNOSIS — R6889 Other general symptoms and signs: Secondary | ICD-10-CM | POA: Diagnosis not present

## 2021-10-10 MED ORDER — METOPROLOL TARTRATE 50 MG PO TABS: ORAL_TABLET | ORAL | 0 refills | Status: DC

## 2021-10-10 NOTE — Progress Notes (Signed)
Cardiology Office Note:    Date:  10/10/2021   ID:  Janet [name], DOB 03-18-1966, MRN [id number]  PCP:  Camillia Herter, NP  [facility] Cardiologist:  Jonavan Vanhorn  [facility] Electrophysiologist:  None   Referring MD: Camillia Herter, NP   Chief Complaint  Patient presents with   Congestive Heart Failure          Sept. 8, 2021   Janet [name] is a 55 y.o. adult with a hx of *obesity, asthma, HTN who we are asked to see by Carla Drape , NP for further evaluation of her shortness of breath   Is short of breath, primarily at night For the past 6 months  Has gained 30 lbs during that time  Long standing lymphedema  Walks on occasion Is going to school  ( [employer]) social work major Walk on campus  Has DOE walking around campus  Has had both covid vaccines.   No CP .  Has just recently improved her diet  Decided to improve her diet when she was referred to a cardiologist   Nov. 21. 2022:  Janet [name] is seen for follow up of her CHF, obesity   Was hospitalized in May, 2022  Echocardiogram from September 23, 2021 reveals moderately depressed left ventricular systolic function with an ejection fraction of 30 to 35%.  She has grade 1 diastolic dysfunction.  She has mild dilatation of her ascending aorta.  She had a right heart catheterization performed in May, 2022.  She had mildly elevated pulmonary pressures.  She did not have coronary angiography.  She is on Farxiga 10 mg a day, Lasix 20 mg a day, Toprol-XL 25 mg a day and Entresto 24-26 mg twice a day.  Despite medical therapy.  Her left ventricular systolic function has not changed significantly.  Sprinkles MSG on her food. Still eating lots of salt,   orders subs from [employer] .  Wt is 280 lbs.   She needs to have a colonoscipy She is at low risk form a CT standpoint for colonoscopy   Past Medical History:  Diagnosis Date   Asthma    CHF (congestive heart failure) (HCC)    Congestive heart failure  (CHF) (HCC)    Depression    Elevated brain natriuretic peptide (BNP) level    Hypertension    Lymphedema    SOB (shortness of breath)     Past Surgical History:  Procedure Laterality Date   ABDOMINAL HYSTERECTOMY     CESAREAN SECTION     HERNIA REPAIR     umbilical as a child   RIGHT/LEFT HEART CATH AND CORONARY ANGIOGRAPHY N/A 04/01/2021   Procedure: RIGHT/LEFT HEART CATH AND CORONARY ANGIOGRAPHY;  Surgeon: Jettie Booze, MD;  Location: [facility];  Service: Cardiovascular;  Laterality: N/A;   TUBAL LIGATION      Current Medications: Current Meds  Medication Sig   albuterol (PROVENTIL) (5 MG/ML) 0.5% nebulizer solution Take 0.5 mLs (2.5 mg total) by nebulization every 6 (six) hours as needed for wheezing or shortness of breath.   albuterol (VENTOLIN HFA) 108 (90 Base) MCG/ACT inhaler Inhale 1-2 puffs into the lungs every 6 (six) hours as needed for wheezing or shortness of breath.   ARIPiprazole (ABILIFY) 10 MG tablet Take 10 mg by mouth daily.   Budeson-Glycopyrrol-Formoterol (BREZTRI AEROSPHERE) 160-9-4.8 MCG/ACT AERO Inhale 2 puffs into the lungs in the morning and at bedtime.   dapagliflozin propanediol (FARXIGA) 10 MG TABS tablet Take 1 tablet (  10 mg total) by mouth daily.   fluticasone (FLONASE) 50 MCG/ACT nasal spray Place 2 sprays into both nostrils daily.   furosemide (LASIX) 20 MG tablet Take 1 tablet (20 mg total) by mouth daily.   hydrOXYzine (ATARAX/VISTARIL) 10 MG tablet Take 10 mg by mouth 2 (two) times daily.   ibuprofen (ADVIL) 600 MG tablet Take 600 mg by mouth daily as needed for mild pain.   ipratropium-albuterol (DUONEB) 0.5-2.5 (3) MG/3ML SOLN Take 3 mLs by nebulization every 6 (six) hours as needed (wheezing, shortness of breath).   metoprolol succinate (TOPROL-XL) 25 MG 24 hr tablet Take 1 tablet (25 mg total) by mouth daily.   metoprolol tartrate (LOPRESSOR) 50 MG tablet Take one tablet by mouth 2 hours prior to CT.   montelukast (SINGULAIR)  10 MG tablet Take 1 tablet (10 mg total) by mouth at bedtime.   sacubitril-valsartan (ENTRESTO) 24-26 MG Take 1 tablet by mouth 2 (two) times daily.   spironolactone (ALDACTONE) 25 MG tablet Take 1 tablet (25 mg total) by mouth daily.   traZODone (DESYREL) 100 MG tablet Take 100 mg by mouth at bedtime.     Allergies:   Clarithromycin, Penicillins, and Whey   Social History   Socioeconomic History   Marital status: Single    Spouse name: Not on file   Number of children: 2   Years of education: Not on file   Highest education level: Not on file  Occupational History   Occupation: Student  Tobacco Use   Smoking status: Never    Passive exposure: Yes   Smokeless tobacco: Never  Vaping Use   Vaping Use: Never used  Substance and Sexual Activity   Alcohol use: Yes    Comment: occasional   Drug use: Not Currently    Types: Marijuana, "Crack" cocaine    Comment: uses marijuana occ   Sexual activity: Yes    Birth control/protection: Surgical  Other Topics Concern   Not on file  Social History Narrative   Not on file   Social Determinants of Health   Financial Resource Strain: Not on file  Food Insecurity: Not on file  Transportation Needs: Not on file  Physical Activity: Not on file  Stress: Not on file  Social Connections: Not on file     Family History: The patient's family history includes Congestive Heart Failure in her maternal grandmother and mother; Deep vein thrombosis in her mother; Hypertension in her mother; Obesity in her brother and sister; Other in her father; Stroke in her mother.  ROS:   Please see the history of present illness.     All other systems reviewed and are negative.  EKGs/Labs/Other Studies Reviewed:    The following studies were reviewed today:   EKG:    Recent Labs: 01/12/2021: ALT 17; TSH 1.520 03/29/2021: B Natriuretic Peptide 336.1 03/30/2021: Magnesium 2.1; Platelets 203 04/01/2021: Hemoglobin 15.3 05/26/2021: BUN 13; Creatinine,  Ser 1.11; Potassium 4.7; Sodium 140  Recent Lipid Panel    Component Value Date/Time   CHOL 170 01/12/2021 1127   TRIG 95 01/12/2021 1127   HDL 74 01/12/2021 1127   CHOLHDL 2.3 01/12/2021 1127   LDLCALC 79 01/12/2021 1127    Physical Exam:    Physical Exam: Blood pressure 128/70, pulse 84, height 5\' 8"  (1.727 m), weight 280 lb 3.2 oz (127.1 kg), SpO2 96 %.  GEN:  Well nourished, well developed in no acute distress HEENT: Normal NECK: No JVD; No carotid bruits LYMPHATICS: No lymphadenopathy CARDIAC: RRR ,  no murmurs, rubs, gallops RESPIRATORY:  Clear to auscultation without rales, wheezing or rhonchi  ABDOMEN: Soft, non-tender, non-distended MUSCULOSKELETAL:  No edema; No deformity  SKIN: Warm and dry NEUROLOGIC:  Alert and oriented x 3   ASSESSMENT:    1. HFrEF (heart failure with reduced ejection fraction) (HCC)     PLAN:       DOE: Ejection fraction remains low.  She still eating a fair amount of salt.   ( Subs, chinese food, ) I encouraged her to stay away from eating any extra salt.  She needs to have a colonoscipy She is at low risk form a CT standpoint for colonoscopy    2.  Chronic combined CHF:   is on good medical therapy. Her EF is not improving . Will get a coronary CT angio to look for CAD   Medication Adjustments/Labs and Tests Ordered: Current medicines are reviewed at length with the patient today.  Concerns regarding medicines are outlined above.  Orders Placed This Encounter  Procedures   CT CORONARY MORPH W/CTA COR W/SCORE W/CA W/CM &/OR WO/CM   Basic metabolic panel    Meds ordered this encounter  Medications   metoprolol tartrate (LOPRESSOR) 50 MG tablet    Sig: Take one tablet by mouth 2 hours prior to CT.    Dispense:  1 tablet    Refill:  0    We are aware she is already on Succinate.  This is just a one time dose for CT.      Patient Instructions  Medication Instructions:  Your physician recommends that you continue on your  current medications as directed. Please refer to the Current Medication list given to you today.  *If you need a refill on your cardiac medications before your next appointment, please call your pharmacy*   Lab Work: BMET today  If you have labs (blood work) drawn today and your tests are completely normal, you will receive your results only by: [place] (if you have MyChart) OR A paper copy in the mail If you have any lab test that is abnormal or we need to change your treatment, we will call you to review the results.   Testing/Procedures: Your physician recommends that you have a Coronary CT performed.   Follow-Up: At [facility], you and your health needs are our priority.  As part of our continuing mission to provide you with exceptional heart care, we have created designated Provider Care Teams.  These Care Teams include your primary Cardiologist (physician) and Advanced Practice Providers (APPs -  Physician Assistants and Nurse Practitioners) who all work together to provide you with the care you need, when you need it.  We recommend signing up for the patient portal called "MyChart".  Sign up information is provided on this After Visit Summary.  MyChart is used to connect with patients for Virtual Visits (Telemedicine).  Patients are able to view lab/test results, encounter notes, upcoming appointments, etc.  Non-urgent messages can be sent to your provider as well.   To learn more about what you can do with MyChart, go to [email or web address].    Your next appointment:   3 month(s)  The format for your next appointment:   In Person  Provider:   Richardson Dopp, PA-C [place], PA-C Christen Bame, NP   Other Instructions   Your cardiac CT will be scheduled at one of the below locations:   [facility] [address] [place], [place] [address] [phone]  [place] [address] [place], [place] [address] [phone]  If scheduled at [facility], please arrive at the [facility] main entrance (entrance A) of [facility] 30 minutes prior to test start time. You can use the FREE valet parking offered at the main entrance (encouraged to control the heart rate for the test) Proceed to the [facility] Radiology Department (first floor) to check-in and test prep.  If scheduled at [facility], please arrive 15 mins early for check-in and test prep.  Please follow these instructions carefully (unless otherwise directed):  Hold all erectile dysfunction medications at least 3 days (72 hrs) prior to test.  On the Night Before the Test: Be sure to Drink plenty of water. Do not consume any caffeinated/decaffeinated beverages or chocolate 12 hours prior to your test. Do not take any antihistamines 12 hours prior to your test.   On the Day of the Test: Drink plenty of water until 1 hour prior to the test. Do not eat any food 4 hours prior to the test. You may take your regular medications prior to the test.  Take metoprolol (Lopressor) two hours prior to test. HOLD Furosemide/Hydrochlorothiazide morning of the test. FEMALES- please wear underwire-free bra if available, avoid dresses & tight clothing       After the Test: Drink plenty of water. After receiving IV contrast, you may experience a mild flushed feeling. This is normal. On occasion, you may experience a mild rash up to 24 hours after the test. This is not dangerous. If this occurs, you can take Benadryl 25 mg and increase your fluid intake. If you experience trouble breathing, this can be serious. If it is severe call 911 IMMEDIATELY. If it is mild, please call our office. If you take any of these medications: Glipizide/Metformin, Avandament, Glucavance, please do not take 48 hours after completing test unless otherwise instructed.  Please allow 2-4 weeks for scheduling  of routine cardiac CTs. Some insurance companies require a pre-authorization which may delay scheduling of this test.   For non-scheduling related questions, please contact the cardiac imaging nurse navigator should you have any questions/concerns: Marchia Bond, Cardiac Imaging Nurse Navigator Gordy Clement, Cardiac Imaging Nurse Navigator Center [facility] V[facility] Services Direct Office Dial: [phone]   For scheduling needs, including cancellations and rescheduling, please call [place], [phone].    Signed, Mertie Moores, MD  10/10/2021 5:28 PM    [place]

## 2021-10-10 NOTE — Addendum Note (Signed)
Addended by: Loren Racer on: 10/10/2021 05:36 PM   Modules accepted: Orders

## 2021-10-10 NOTE — Patient Instructions (Signed)
Medication Instructions:  Your physician recommends that you continue on your current medications as directed. Please refer to the Current Medication list given to you today.  *If you need a refill on your cardiac medications before your next appointment, please call your pharmacy*   Lab Work: BMET today  If you have labs (blood work) drawn today and your tests are completely normal, you will receive your results only by: [place] (if you have MyChart) OR A paper copy in the mail If you have any lab test that is abnormal or we need to change your treatment, we will call you to review the results.   Testing/Procedures: Your physician recommends that you have a Coronary CT performed.   Follow-Up: At [facility], you and your health needs are our priority.  As part of our continuing mission to provide you with exceptional heart care, we have created designated Provider Care Teams.  These Care Teams include your primary Cardiologist (physician) and Advanced Practice Providers (APPs -  Physician Assistants and Nurse Practitioners) who all work together to provide you with the care you need, when you need it.  We recommend signing up for the patient portal called "MyChart".  Sign up information is provided on this After Visit Summary.  MyChart is used to connect with patients for Virtual Visits (Telemedicine).  Patients are able to view lab/test results, encounter notes, upcoming appointments, etc.  Non-urgent messages can be sent to your provider as well.   To learn more about what you can do with MyChart, go to [email or web address].    Your next appointment:   3 month(s)  The format for your next appointment:   In Person  Provider:   Richardson Dopp, PA-C Vin Bhagat, PA-C Christen Bame, NP   Other Instructions   Your cardiac CT will be scheduled at one of the below locations:   [facility] [address] [place], [place] [address] [phone]  [place] [address] [place], [place] [address] [phone]  If scheduled at [facility], please arrive at the [facility] main entrance (entrance A) of [facility] 30 minutes prior to test start time. You can use the FREE valet parking offered at the main entrance (encouraged to control the heart rate for the test) Proceed to the [facility] Radiology Department (first floor) to check-in and test prep.  If scheduled at [facility], please arrive 15 mins early for check-in and test prep.  Please follow these instructions carefully (unless otherwise directed):  Hold all erectile dysfunction medications at least 3 days (72 hrs) prior to test.  On the Night Before the Test: Be sure to Drink plenty of water. Do not consume any caffeinated/decaffeinated beverages or chocolate 12 hours prior to your test. Do not take any antihistamines 12 hours prior to your test.   On the Day of the Test: Drink plenty of water until 1 hour prior to the test. Do not eat any food 4 hours prior to the test. You may take your regular medications prior to the test.  Take metoprolol (Lopressor) two hours prior to test. HOLD Furosemide/Hydrochlorothiazide morning of the test. FEMALES- please wear underwire-free bra if available, avoid dresses & tight clothing       After the Test: Drink plenty of water. After receiving IV contrast, you may experience a mild flushed feeling. This is normal. On occasion, you may experience a mild rash up to  24 hours after the test. This is not dangerous. If this occurs, you can take Benadryl 25 mg and increase your fluid intake. If you experience trouble breathing, this can be serious. If it is severe call 911 IMMEDIATELY. If it is mild, please call our office. If you take any of these medications: Glipizide/Metformin, Avandament, Glucavance, please do not take 48 hours  after completing test unless otherwise instructed.  Please allow 2-4 weeks for scheduling of routine cardiac CTs. Some insurance companies require a pre-authorization which may delay scheduling of this test.   For non-scheduling related questions, please contact the cardiac imaging nurse navigator should you have any questions/concerns: Marchia Bond, Cardiac Imaging Nurse Navigator Gordy Clement, Cardiac Imaging Nurse Navigator  Heart and Vascular Services Direct Office Dial: [phone]   For scheduling needs, including cancellations and rescheduling, please call [place], [phone].

## 2021-10-11 ENCOUNTER — Ambulatory Visit (HOSPITAL_COMMUNITY): Payer: Medicare HMO

## 2021-10-11 ENCOUNTER — Other Ambulatory Visit: Payer: Self-pay | Admitting: Cardiovascular Disease

## 2021-10-11 LAB — BASIC METABOLIC PANEL
BUN/Creatinine Ratio: 13 (ref 9–23)
BUN: 15 mg/dL (ref 6–24)
CO2: 23 mmol/L (ref 20–29)
Calcium: 9.3 mg/dL (ref 8.7–10.2)
Chloride: 104 mmol/L (ref 96–106)
Creatinine, Ser: 1.14 mg/dL — ABNORMAL HIGH (ref 0.57–1.00)
Glucose: 88 mg/dL (ref 70–99)
Potassium: 4.5 mmol/L (ref 3.5–5.2)
Sodium: 139 mmol/L (ref 134–144)
eGFR: 57 mL/min/{1.73_m2} — ABNORMAL LOW (ref >59)

## 2021-10-12 ENCOUNTER — Encounter (HOSPITAL_COMMUNITY): Payer: Self-pay | Admitting: *Deleted

## 2021-10-12 NOTE — Progress Notes (Signed)
Received second referral from Dr. Acie Fredrickson for this pt to participate in Cardiac reha bwith the diagnosis of Chronic systolic heart failure.  Pt was referred back in the summer and when she was contacted for scheduling, she indicated that she had a lot going on. Clinical review of pt follow up appt on 11/21 with Dr. Kristopher Oppenheim  - cardiologist office note. Medicaid reimbursement form sent for completion. Pt appropriate for scheduling for on site cardiac rehab and/or enrollment in Virtual Cardiac Rehab once this form is completed and returned.   Pt Covid Risk Score is 4  Will forward to staff for follow up. Cherre Huger, BSN Cardiac and Training and development officer

## 2021-10-21 ENCOUNTER — Telehealth (HOSPITAL_COMMUNITY): Payer: Self-pay | Admitting: *Deleted

## 2021-10-21 NOTE — Telephone Encounter (Signed)
Patient reaching out regarding upcoming cardiac imaging study; pt verbalizes understanding of appt date/time, parking situation and where to check in, pre-test NPO status and medications ordered, and verified current allergies; name and call back number provided for further questions should they arise  Gordy Clement RN Navigator Cardiac Imaging [facility] [phone] office [phone] cell  Patient to take 25mg  metoprolol succinate and 50mg  metoprolol tartrate two hours prior to cardiac CT scan. She is aware to arrive at 10:30am for her 11am scan.

## 2021-10-25 ENCOUNTER — Other Ambulatory Visit: Payer: Self-pay

## 2021-10-25 ENCOUNTER — Ambulatory Visit (HOSPITAL_COMMUNITY)
Admission: RE | Admit: 2021-10-25 | Discharge: 2021-10-25 | Disposition: A | Discharge status: Home / Self Care | Payer: Medicare HMO | Source: Ambulatory Visit | Attending: Cardiovascular Disease | Admitting: Cardiovascular Disease

## 2021-10-25 DIAGNOSIS — I5020 Unspecified systolic (congestive) heart failure: Secondary | ICD-10-CM | POA: Diagnosis not present

## 2021-10-25 MED ORDER — IOHEXOL 350 MG/ML SOLN
100.0000 mL | Freq: Once | INTRAVENOUS | Status: AC | PRN
  Administered 2021-10-25: 100 mL via INTRAVENOUS

## 2021-10-25 MED ORDER — NITROGLYCERIN 0.4 MG SL SUBL
SUBLINGUAL_TABLET | SUBLINGUAL | Status: AC
  Filled 2021-10-25: qty 2

## 2021-10-25 MED ORDER — NITROGLYCERIN 0.4 MG SL SUBL
0.8000 mg | SUBLINGUAL_TABLET | Freq: Once | SUBLINGUAL | Status: AC
  Administered 2021-10-25: 0.8 mg via SUBLINGUAL

## 2021-11-10 ENCOUNTER — Encounter (HOSPITAL_COMMUNITY): Payer: Self-pay

## 2021-11-13 DIAGNOSIS — R079 Chest pain, unspecified: Secondary | ICD-10-CM | POA: Diagnosis not present

## 2021-11-13 DIAGNOSIS — Z20822 Contact with and (suspected) exposure to covid-19: Secondary | ICD-10-CM | POA: Diagnosis not present

## 2021-11-13 DIAGNOSIS — J45901 Unspecified asthma with (acute) exacerbation: Secondary | ICD-10-CM | POA: Diagnosis not present

## 2021-11-13 DIAGNOSIS — I10 Essential (primary) hypertension: Secondary | ICD-10-CM | POA: Diagnosis not present

## 2021-11-13 DIAGNOSIS — J45909 Unspecified asthma, uncomplicated: Secondary | ICD-10-CM | POA: Diagnosis not present

## 2021-11-13 DIAGNOSIS — R0789 Other chest pain: Secondary | ICD-10-CM | POA: Diagnosis not present

## 2021-11-13 DIAGNOSIS — Z881 Allergy status to other antibiotic agents status: Secondary | ICD-10-CM | POA: Diagnosis not present

## 2021-11-13 DIAGNOSIS — Z880 Allergy status to penicillin: Secondary | ICD-10-CM | POA: Diagnosis not present

## 2021-11-13 DIAGNOSIS — R9431 Abnormal electrocardiogram [ECG] [EKG]: Secondary | ICD-10-CM | POA: Diagnosis not present

## 2021-11-20 ENCOUNTER — Other Ambulatory Visit: Payer: Self-pay | Admitting: Family

## 2021-11-20 DIAGNOSIS — I428 Other cardiomyopathies: Secondary | ICD-10-CM

## 2021-11-20 DIAGNOSIS — I5020 Unspecified systolic (congestive) heart failure: Secondary | ICD-10-CM

## 2021-11-25 ENCOUNTER — Other Ambulatory Visit: Payer: Self-pay | Admitting: Family

## 2021-11-25 DIAGNOSIS — I5020 Unspecified systolic (congestive) heart failure: Secondary | ICD-10-CM

## 2021-11-25 DIAGNOSIS — I428 Other cardiomyopathies: Secondary | ICD-10-CM

## 2021-12-06 ENCOUNTER — Telehealth (HOSPITAL_COMMUNITY): Payer: Self-pay

## 2021-12-06 NOTE — Telephone Encounter (Signed)
No response from pt.  Closed referral  

## 2021-12-13 DIAGNOSIS — F419 Anxiety disorder, unspecified: Secondary | ICD-10-CM | POA: Diagnosis not present

## 2021-12-13 DIAGNOSIS — R059 Cough, unspecified: Secondary | ICD-10-CM | POA: Diagnosis not present

## 2021-12-13 DIAGNOSIS — I509 Heart failure, unspecified: Secondary | ICD-10-CM | POA: Diagnosis not present

## 2021-12-13 DIAGNOSIS — I10 Essential (primary) hypertension: Secondary | ICD-10-CM | POA: Diagnosis not present

## 2021-12-13 DIAGNOSIS — J45901 Unspecified asthma with (acute) exacerbation: Secondary | ICD-10-CM | POA: Diagnosis not present

## 2021-12-13 DIAGNOSIS — R042 Hemoptysis: Secondary | ICD-10-CM | POA: Diagnosis not present

## 2021-12-13 DIAGNOSIS — I5020 Unspecified systolic (congestive) heart failure: Secondary | ICD-10-CM | POA: Diagnosis not present

## 2021-12-13 DIAGNOSIS — R0789 Other chest pain: Secondary | ICD-10-CM | POA: Diagnosis not present

## 2021-12-13 DIAGNOSIS — I110 Hypertensive heart disease with heart failure: Secondary | ICD-10-CM | POA: Diagnosis not present

## 2021-12-13 DIAGNOSIS — R7989 Other specified abnormal findings of blood chemistry: Secondary | ICD-10-CM | POA: Diagnosis not present

## 2021-12-13 DIAGNOSIS — F1490 Cocaine use, unspecified, uncomplicated: Secondary | ICD-10-CM | POA: Diagnosis not present

## 2021-12-13 DIAGNOSIS — R609 Edema, unspecified: Secondary | ICD-10-CM | POA: Diagnosis not present

## 2021-12-13 DIAGNOSIS — R778 Other specified abnormalities of plasma proteins: Secondary | ICD-10-CM | POA: Diagnosis not present

## 2021-12-13 DIAGNOSIS — R079 Chest pain, unspecified: Secondary | ICD-10-CM | POA: Diagnosis not present

## 2021-12-14 DIAGNOSIS — I517 Cardiomegaly: Secondary | ICD-10-CM | POA: Diagnosis not present

## 2021-12-14 DIAGNOSIS — I501 Left ventricular failure: Secondary | ICD-10-CM | POA: Diagnosis not present

## 2021-12-14 DIAGNOSIS — R0789 Other chest pain: Secondary | ICD-10-CM | POA: Diagnosis not present

## 2021-12-14 DIAGNOSIS — I351 Nonrheumatic aortic (valve) insufficiency: Secondary | ICD-10-CM | POA: Diagnosis not present

## 2021-12-14 DIAGNOSIS — I509 Heart failure, unspecified: Secondary | ICD-10-CM | POA: Diagnosis not present

## 2021-12-14 DIAGNOSIS — F419 Anxiety disorder, unspecified: Secondary | ICD-10-CM | POA: Diagnosis not present

## 2021-12-14 DIAGNOSIS — R778 Other specified abnormalities of plasma proteins: Secondary | ICD-10-CM | POA: Diagnosis not present

## 2021-12-14 DIAGNOSIS — J45901 Unspecified asthma with (acute) exacerbation: Secondary | ICD-10-CM | POA: Diagnosis not present

## 2021-12-15 DIAGNOSIS — I428 Other cardiomyopathies: Secondary | ICD-10-CM | POA: Diagnosis not present

## 2021-12-15 DIAGNOSIS — R042 Hemoptysis: Secondary | ICD-10-CM | POA: Diagnosis not present

## 2021-12-15 DIAGNOSIS — I5020 Unspecified systolic (congestive) heart failure: Secondary | ICD-10-CM | POA: Diagnosis not present

## 2021-12-15 DIAGNOSIS — J45901 Unspecified asthma with (acute) exacerbation: Secondary | ICD-10-CM | POA: Diagnosis not present

## 2021-12-20 ENCOUNTER — Other Ambulatory Visit: Payer: Self-pay | Admitting: *Deleted

## 2021-12-20 DIAGNOSIS — I5020 Unspecified systolic (congestive) heart failure: Secondary | ICD-10-CM

## 2021-12-20 DIAGNOSIS — I428 Other cardiomyopathies: Secondary | ICD-10-CM

## 2021-12-20 MED ORDER — FUROSEMIDE 20 MG PO TABS: 20.0000 mg | ORAL_TABLET | Freq: Every day | ORAL | 5 refills | Status: DC

## 2021-12-20 MED ORDER — ENTRESTO 24-26 MG PO TABS: 1.0000 | ORAL_TABLET | Freq: Two times a day (BID) | ORAL | 3 refills | Status: DC

## 2021-12-20 MED ORDER — SPIRONOLACTONE 25 MG PO TABS: 25.0000 mg | ORAL_TABLET | Freq: Every day | ORAL | 5 refills | Status: DC

## 2021-12-20 MED ORDER — HYDROXYZINE HCL 10 MG PO TABS: 10.0000 mg | ORAL_TABLET | Freq: Two times a day (BID) | ORAL | 3 refills | Status: DC

## 2021-12-20 MED ORDER — METOPROLOL SUCCINATE ER 25 MG PO TB24: ORAL_TABLET | ORAL | 3 refills | Status: DC

## 2021-12-26 ENCOUNTER — Other Ambulatory Visit: Payer: Self-pay | Admitting: *Deleted

## 2021-12-26 DIAGNOSIS — I5020 Unspecified systolic (congestive) heart failure: Secondary | ICD-10-CM

## 2021-12-26 DIAGNOSIS — I428 Other cardiomyopathies: Secondary | ICD-10-CM

## 2021-12-26 MED ORDER — FUROSEMIDE 20 MG PO TABS: 20.0000 mg | ORAL_TABLET | Freq: Every day | ORAL | 5 refills | Status: DC

## 2021-12-26 MED ORDER — ENTRESTO 24-26 MG PO TABS: 1.0000 | ORAL_TABLET | Freq: Two times a day (BID) | ORAL | 3 refills | Status: DC

## 2021-12-26 MED ORDER — SPIRONOLACTONE 25 MG PO TABS: 25.0000 mg | ORAL_TABLET | Freq: Every day | ORAL | 5 refills | Status: DC

## 2021-12-26 MED ORDER — HYDROXYZINE HCL 10 MG PO TABS: 10.0000 mg | ORAL_TABLET | Freq: Two times a day (BID) | ORAL | 3 refills | Status: DC

## 2021-12-26 MED ORDER — METOPROLOL SUCCINATE ER 25 MG PO TB24: ORAL_TABLET | ORAL | 3 refills | Status: DC

## 2021-12-26 MED ORDER — DAPAGLIFLOZIN PROPANEDIOL 10 MG PO TABS: 10.0000 mg | ORAL_TABLET | Freq: Every day | ORAL | 5 refills | Status: DC

## 2022-01-10 ENCOUNTER — Ambulatory Visit: Payer: Medicaid Other | Admitting: Physician Assistant

## 2022-01-30 ENCOUNTER — Telehealth (HOSPITAL_COMMUNITY): Payer: Self-pay

## 2022-01-30 NOTE — Telephone Encounter (Signed)
Pt insurance is active and benefits verified through [facility] Medicare Co-pay 0, DED 0/0 met, out of pocket $8,300/0 met, co-insurance 20%. no pre-authorization required. Passport, 01/30/2022'@3' :50pm, REF# [id number] ?  ?2ndary insurance is active and benefits verified through Medicare. Co-pay 0, DED 0/0 met, out of pocket 0/0 met, co-insurance 0%. No pre-authorization required. Passport, 01/30/2022'@4' :01pm, REF# [id number] ?

## 2022-01-30 NOTE — Telephone Encounter (Signed)
Pt called back and stated that she is interested in scheduling for the cardiac rehab program. Pt will come in for orientation on 02/28/2022'@10'$  and will attend the 10:30am exercise class time. ?  ?Mailed packet ?

## 2022-02-03 ENCOUNTER — Ambulatory Visit (INDEPENDENT_AMBULATORY_CARE_PROVIDER_SITE_OTHER): Payer: Medicare Other | Admitting: Cardiovascular Disease

## 2022-02-03 ENCOUNTER — Encounter: Payer: Self-pay | Admitting: Cardiovascular Disease

## 2022-02-03 ENCOUNTER — Other Ambulatory Visit: Payer: Self-pay

## 2022-02-03 VITALS — BP 124/86 | HR 70 | Ht 68.0 in | Wt 267.0 lb

## 2022-02-03 DIAGNOSIS — I5020 Unspecified systolic (congestive) heart failure: Secondary | ICD-10-CM

## 2022-02-03 MED ORDER — ENTRESTO 49-51 MG PO TABS: 1.0000 | ORAL_TABLET | Freq: Two times a day (BID) | ORAL | 3 refills | Status: DC

## 2022-02-03 MED ORDER — CARVEDILOL 6.25 MG PO TABS: 6.2500 mg | ORAL_TABLET | Freq: Two times a day (BID) | ORAL | 3 refills | Status: DC

## 2022-02-03 NOTE — Patient Instructions (Signed)
Medication Instructions:  ?Your physician has recommended you make the following change in your medication: ? ?1) INCREASE Entresto 49-'51mg'$  twice daily ?2) INCREASE Carvedilol (Coreg) 6.'25mg'$  twice daily (start this 2 weeks after new dose of Entresto) ? ?*If you need a refill on your cardiac medications before your next appointment, please call your pharmacy* ? ?Lab Work: ?NONE ?If you have labs (blood work) drawn today and your tests are completely normal, you will receive your results only by: ?MyChart Message (if you have MyChart) OR ?A paper copy in the mail ?If you have any lab test that is abnormal or we need to change your treatment, we will call you to review the results. ? ?Testing/Procedures: ?Your physician has requested that you have an echocardiogram 1 week prior to office visit in 3 months (June 2023). Echocardiography is a painless test that uses sound waves to create images of your heart. It provides your doctor with information about the size and shape of your heart and how well your heart?s chambers and valves are working. This procedure takes approximately one hour. There are no restrictions for this procedure. ? ?Follow-Up: ?At [facility], you and your health needs are our priority.  As part of our continuing mission to provide you with exceptional heart care, we have created designated Provider Care Teams.  These Care Teams include your primary Cardiologist (physician) and Advanced Practice Providers (APPs -  Physician Assistants and Nurse Practitioners) who all work together to provide you with the care you need, when you need it. ? ?Your next appointment:   ?3 month(s) ? ?The format for your next appointment:   ?In Person ? ?Provider:   ?Mertie Moores, MD ?

## 2022-02-03 NOTE — Progress Notes (Signed)
?Cardiology Office Note:   ? ?Date:  02/03/2022  ? ?ID:  Janet [name], DOB 01/03/1966, MRN [id number] ? ?PCP:  Camillia Herter, NP  ?[facility] Cardiologist:  Solveig Fangman  ?[place] Electrophysiologist:  None  ? ?Referring MD: Camillia Herter, NP  ? ?Chief Complaint  ?Patient presents with  ? Congestive Heart Failure  ?   ?  ?  ? ?Sept. 8, 2021  ? ?Janet [name] is a 56 y.o. adult with a hx of *obesity, asthma, HTN who we are asked to see by Carla Drape , NP for further evaluation of her shortness of breath  ? ?Is short of breath, primarily at night ?For the past 6 months  ?Has gained 30 lbs during that time  ?Long standing lymphedema  ?Walks on occasion ?Is going to school  ( [employer]) social work major ?Walk on campus  ?Has DOE walking around campus  ?Has had both covid vaccines.  ? ?No CP .  ?Has just recently improved her diet  ?Decided to improve her diet when she was referred to a cardiologist  ? ?Nov. 21. 2022: ? ?Janet [name] is seen for follow up of her CHF, obesity  ? ?Was hospitalized in May, 2022 ? ?Echocardiogram from September 23, 2021 reveals moderately depressed left ventricular systolic function with an ejection fraction of 30 to 35%.  She has grade 1 diastolic dysfunction.  She has mild dilatation of her ascending aorta. ? ?She had a right heart catheterization performed in May, 2022.  She had mildly elevated pulmonary pressures.  She did not have coronary angiography. ? ?She is on Farxiga 10 mg a day, Lasix 20 mg a day, Toprol-XL 25 mg a day and Entresto 24-26 mg twice a day. ? ?Despite medical therapy.  Her left ventricular systolic function has not changed significantly. ? ?Sprinkles MSG on her food. ?Still eating lots of salt,   orders subs from [employer] .  ?Wt is 280 lbs.  ? ?She needs to have a colonoscipy ?She is at low risk form a CT standpoint for colonoscopy  ? ?February 03, 2022: ?Janet [name] seen today for a follow-up visit of her congestive heart failure.  Her ejection fraction has not  improved on medical therapy. ?We obtained a coronary CT angiogram because of her lack of improvement of her ejection fraction.  Her coronary calcium score is 0.  She has normal coronary arteries. ? ?She is feeling better  ?Wt is 267 lbs.( Down 13 lbs in 4 months )  ? ?The plan is to increase her Entresto to 49-51 twice a day.  2 weeks after that we will increase the carvedilol to 6.25 mg twice a day. ? ?We will get an echocardiogram in approximately 3 months with an office visit with me or an APP approximately 1 week after that. ?If her ejection fraction is starting to improve we will continue with the current plan.  If her EF is not improving then we will refer her to the heart failure clinic. ? ?Past Medical History:  ?Diagnosis Date  ? Asthma   ? CHF (congestive heart failure) ([place])   ? Congestive heart failure (CHF) ([place])   ? Depression   ? Elevated brain natriuretic peptide (BNP) level   ? Hypertension   ? Lymphedema   ? SOB (shortness of breath)   ? ? ?Past Surgical History:  ?Procedure Laterality Date  ? ABDOMINAL HYSTERECTOMY    ? CESAREAN SECTION    ? HERNIA REPAIR    ? umbilical  as a child  ? RIGHT/LEFT HEART CATH AND CORONARY ANGIOGRAPHY N/A 04/01/2021  ? Procedure: RIGHT/LEFT HEART CATH AND CORONARY ANGIOGRAPHY;  Surgeon: Jettie Booze, MD;  Location: [facility];  Service: Cardiovascular;  Laterality: N/A;  ? TUBAL LIGATION    ? ? ?Current Medications: ?Current Meds  ?Medication Sig  ? albuterol (PROVENTIL) (5 MG/ML) 0.5% nebulizer solution Take 0.5 mLs (2.5 mg total) by nebulization every 6 (six) hours as needed for wheezing or shortness of breath.  ? albuterol (VENTOLIN HFA) 108 (90 Base) MCG/ACT inhaler Inhale 1-2 puffs into the lungs every 6 (six) hours as needed for wheezing or shortness of breath.  ? ARIPiprazole (ABILIFY) 10 MG tablet Take 10 mg by mouth daily.  ? Budeson-Glycopyrrol-Formoterol (BREZTRI AEROSPHERE) 160-9-4.8 MCG/ACT AERO Inhale 2 puffs into the lungs in the morning  and at bedtime.  ? carvedilol (COREG) 6.25 MG tablet Take 1 tablet (6.25 mg total) by mouth 2 (two) times daily.  ? dapagliflozin propanediol (FARXIGA) 10 MG TABS tablet Take 1 tablet (10 mg total) by mouth daily.  ? fluticasone (FLONASE) 50 MCG/ACT nasal spray Place 2 sprays into both nostrils daily.  ? furosemide (LASIX) 20 MG tablet Take 1 tablet (20 mg total) by mouth daily.  ? hydrOXYzine (ATARAX) 10 MG tablet Take 1 tablet (10 mg total) by mouth 2 (two) times daily.  ? ibuprofen (ADVIL) 600 MG tablet Take 600 mg by mouth daily as needed for mild pain.  ? ipratropium-albuterol (DUONEB) 0.5-2.5 (3) MG/3ML SOLN Take 3 mLs by nebulization every 6 (six) hours as needed (wheezing, shortness of breath).  ? montelukast (SINGULAIR) 10 MG tablet Take 1 tablet (10 mg total) by mouth at bedtime.  ? sacubitril-valsartan (ENTRESTO) 49-51 MG Take 1 tablet by mouth 2 (two) times daily.  ? spironolactone (ALDACTONE) 25 MG tablet Take 1 tablet (25 mg total) by mouth daily.  ? traZODone (DESYREL) 100 MG tablet Take 150 mg by mouth at bedtime.  ? [DISCONTINUED] carvedilol (COREG) 3.125 MG tablet Take 3.125 mg by mouth 2 (two) times daily.  ? [DISCONTINUED] sacubitril-valsartan (ENTRESTO) 24-26 MG Take 1 tablet by mouth 2 (two) times daily.  ?  ? ?Allergies:   Clarithromycin and Penicillins  ? ?Social History  ? ?Socioeconomic History  ? Marital status: Single  ?  Spouse name: Not on file  ? Number of children: 2  ? Years of education: Not on file  ? Highest education level: Not on file  ?Occupational History  ? Occupation: Ship broker  ?Tobacco Use  ? Smoking status: Never  ?  Passive exposure: Yes  ? Smokeless tobacco: Never  ?Vaping Use  ? Vaping Use: Never used  ?Substance and Sexual Activity  ? Alcohol use: Yes  ?  Comment: occasional  ? Drug use: Not Currently  ?  Types: Marijuana, "Crack" cocaine  ?  Comment: uses marijuana occ  ? Sexual activity: Yes  ?  Birth control/protection: Surgical  ?Other Topics Concern  ? Not on file   ?Social History Narrative  ? Not on file  ? ?Social Determinants of Health  ? ?Financial Resource Strain: Not on file  ?Food Insecurity: Not on file  ?Transportation Needs: Not on file  ?Physical Activity: Not on file  ?Stress: Not on file  ?Social Connections: Not on file  ?  ? ?Family History: ?The patient's family history includes Congestive Heart Failure in her maternal grandmother and mother; Deep vein thrombosis in her mother; Hypertension in her mother; Obesity in her brother  and sister; Other in her father; Stroke in her mother. ? ?ROS:   ?Please see the history of present illness.    ? All other systems reviewed and are negative. ? ?EKGs/Labs/Other Studies Reviewed:   ? ?The following studies were reviewed today: ? ? ?EKG: February 03, 2022: Normal sinus rhythm 70.  She has T wave inversions in the inferior and lateral leads.  Is basically unchanged from previous EKGs. ? ?Recent Labs: ?03/29/2021: B Natriuretic Peptide 336.1 ?03/30/2021: Magnesium 2.1; Platelets 203 ?04/01/2021: Hemoglobin 15.3 ?10/10/2021: BUN 15; Creatinine, Ser 1.14; Potassium 4.5; Sodium 139  ?Recent Lipid Panel ?   ?Component Value Date/Time  ? CHOL 170 01/12/2021 1127  ? TRIG 95 01/12/2021 1127  ? HDL 74 01/12/2021 1127  ? CHOLHDL 2.3 01/12/2021 1127  ? [place] 79 01/12/2021 1127  ? ? ?Physical Exam:   ? ? ?Physical Exam: ?Blood pressure 124/86, pulse 70, height '5\' 8"'$  (1.727 m), weight 267 lb (121.1 kg), SpO2 96 %. ? ?GEN:  Well nourished, well developed in no acute distress ?HEENT: Normal ?NECK: No JVD; No carotid bruits ?LYMPHATICS: No lymphadenopathy ?CARDIAC: RRR , no murmurs, rubs, gallops ?RESPIRATORY:  Clear to auscultation without rales, wheezing or rhonchi  ?ABDOMEN: Soft, non-tender, non-distended ?MUSCULOSKELETAL:  No edema; No deformity  ?SKIN: Warm and dry ?NEUROLOGIC:  Alert and oriented x 3 ? ? ? ?ASSESSMENT:   ? ?1. HFrEF (heart failure with reduced ejection fraction) ([place])   ? ? ? ?PLAN:   ? ?  ? ?DOE:  has improved some  with her weight loss  ? ? ?  ?2.  Chronic combined CHF:   she seems to be feeling better.   Her EF has not increased on current meds. ?Coronary CTA did not reveal any evidence of CAD .   We will  increa

## 2022-02-21 ENCOUNTER — Other Ambulatory Visit: Payer: Self-pay | Admitting: Family

## 2022-02-21 ENCOUNTER — Other Ambulatory Visit: Payer: Self-pay | Admitting: Internal Medicine

## 2022-02-21 DIAGNOSIS — Z1231 Encounter for screening mammogram for malignant neoplasm of breast: Secondary | ICD-10-CM

## 2022-02-25 ENCOUNTER — Other Ambulatory Visit: Payer: Self-pay | Admitting: Family

## 2022-02-25 DIAGNOSIS — I428 Other cardiomyopathies: Secondary | ICD-10-CM

## 2022-02-25 DIAGNOSIS — I5020 Unspecified systolic (congestive) heart failure: Secondary | ICD-10-CM

## 2022-02-27 ENCOUNTER — Telehealth (HOSPITAL_COMMUNITY): Payer: Self-pay | Admitting: *Deleted

## 2022-02-27 NOTE — Telephone Encounter (Signed)
Confirmed appointment completed health history.[name], RN,BSN ?02/27/2022 6:09 PM  ?

## 2022-02-28 ENCOUNTER — Encounter (HOSPITAL_COMMUNITY): Payer: Self-pay

## 2022-02-28 ENCOUNTER — Encounter (HOSPITAL_COMMUNITY)
Admission: RE | Admit: 2022-02-28 | Discharge: 2022-02-28 | Disposition: A | Discharge status: Home / Self Care | Payer: Medicare Other | Source: Ambulatory Visit | Attending: Cardiovascular Disease | Admitting: Cardiovascular Disease

## 2022-02-28 VITALS — BP 104/70 | HR 78 | Ht 68.0 in | Wt 280.6 lb

## 2022-02-28 DIAGNOSIS — I428 Other cardiomyopathies: Secondary | ICD-10-CM | POA: Insufficient documentation

## 2022-02-28 DIAGNOSIS — I5022 Chronic systolic (congestive) heart failure: Secondary | ICD-10-CM | POA: Insufficient documentation

## 2022-02-28 NOTE — Progress Notes (Signed)
Cardiac Individual Treatment Plan ? ?Patient Details  ?Name: Janet [name] ?MRN: [id number] ?Date of Birth: [birth year] ?Referring Provider:   ?Flowsheet Row CARDIAC REHAB PHASE II ORIENTATION from 02/28/2022 in [facility]  ?Referring Provider Nahser, Wonda Cheng, MD.  ? ?  ? ? ?Initial Encounter Date:  ?Flowsheet Row CARDIAC REHAB PHASE II ORIENTATION from 02/28/2022 in [place]  ?Date 02/28/22  ? ?  ? ? ?Visit Diagnosis: Heart failure, chronic systolic (HCC) ? ?Patient's Home Medications on Admission: ? ?Current Outpatient Medications:  ?  albuterol (VENTOLIN HFA) 108 (90 Base) MCG/ACT inhaler, Inhale 1-2 puffs into the lungs every 6 (six) hours as needed for wheezing or shortness of breath., Disp: 1 each, Rfl: 1 ?  aspirin 81 MG chewable tablet, Chew 81 mg by mouth daily. aspirin 81 mg chewable tablet, Disp: , Rfl:  ?  BLACK CURRANT SEED OIL PO, Take 5 mLs by mouth in the morning and at bedtime., Disp: , Rfl:  ?  Budeson-Glycopyrrol-Formoterol (BREZTRI AEROSPHERE) 160-9-4.8 MCG/ACT AERO, Inhale 2 puffs into the lungs in the morning and at bedtime., Disp: 10.7 g, Rfl: 5 ?  carvedilol (COREG) 3.125 MG tablet, Take 6.25 mg by mouth 2 (two) times daily with a meal., Disp: , Rfl:  ?  dapagliflozin propanediol (FARXIGA) 10 MG TABS tablet, Take 1 tablet (10 mg total) by mouth daily., Disp: 30 tablet, Rfl: 5 ?  fluticasone (FLONASE) 50 MCG/ACT nasal spray, Place 2 sprays into both nostrils daily. (Patient taking differently: Place 2 sprays into both nostrils daily as needed for allergies.), Disp: 16 g, Rfl: 0 ?  furosemide (LASIX) 20 MG tablet, Take 1 tablet (20 mg total) by mouth daily., Disp: 30 tablet, Rfl: 5 ?  ibuprofen (ADVIL) 600 MG tablet, Take 600 mg by mouth daily as needed for mild pain., Disp: , Rfl:  ?  ipratropium-albuterol (DUONEB) 0.5-2.5 (3) MG/3ML SOLN, Take 3 mLs by nebulization every 6 (six) hours as needed (wheezing, shortness of breath).,  Disp: 360 mL, Rfl: 0 ?  montelukast (SINGULAIR) 10 MG tablet, Take 1 tablet (10 mg total) by mouth at bedtime., Disp: 90 tablet, Rfl: 0 ?  Multiple Vitamins-Minerals (ADULT ONE DAILY GUMMIES) CHEW, Chew 2 tablets by mouth daily with breakfast., Disp: , Rfl:  ?  sacubitril-valsartan (ENTRESTO) 24-26 MG, Take 2 tablets by mouth 2 (two) times daily., Disp: , Rfl:  ?  traZODone (DESYREL) 150 MG tablet, Take 150 mg by mouth at bedtime., Disp: , Rfl:  ?  albuterol (PROVENTIL) (5 MG/ML) 0.5% nebulizer solution, Take 0.5 mLs (2.5 mg total) by nebulization every 6 (six) hours as needed for wheezing or shortness of breath. (Patient not taking: Reported on 02/23/2022), Disp: 20 mL, Rfl: 1 ?  ARIPiprazole (ABILIFY) 10 MG tablet, Take 10 mg by mouth daily., Disp: , Rfl:  ?  carvedilol (COREG) 6.25 MG tablet, Take 1 tablet (6.25 mg total) by mouth 2 (two) times daily., Disp: 180 tablet, Rfl: 3 ?  hydrOXYzine (ATARAX) 10 MG tablet, Take 1 tablet (10 mg total) by mouth 2 (two) times daily., Disp: 180 tablet, Rfl: 3 ?  sacubitril-valsartan (ENTRESTO) 49-51 MG, Take 1 tablet by mouth 2 (two) times daily., Disp: 180 tablet, Rfl: 3 ?  spironolactone (ALDACTONE) 25 MG tablet, TAKE 1 TABLET(25 MG) BY MOUTH DAILY, Disp: 90 tablet, Rfl: 3 ? ?Past Medical History: ?Past Medical History:  ?Diagnosis Date  ? Asthma   ? CHF (congestive heart failure) ([place])   ? Congestive  heart failure (CHF) ([place])   ? Depression   ? Elevated brain natriuretic peptide (BNP) level   ? Hypertension   ? Lymphedema   ? SOB (shortness of breath)   ? ? ?Tobacco Use: ?Social History  ? ?Tobacco Use  ?Smoking Status Never  ? Passive exposure: Yes  ?Smokeless Tobacco Never  ? ? ?Labs: ?Review Flowsheet   ? ?  ?  Latest Ref Rng & Units 01/12/2021 04/01/2021  ?Labs for ITP Cardiac and Pulmonary Rehab  ?Cholestrol 100 - 199 mg/dL 170     ?LDL (calc) 0 - 99 mg/dL 79     ?HDL-C >39 mg/dL 74     ?Trlycerides 0 - 149 mg/dL 95     ?PH, Arterial 7.350 - 7.450  7.313    ?PCO2 arterial  32.0 - 48.0 mmHg  62.3    ?Bicarbonate 20.0 - 28.0 mmol/L  32.0    ? 32.4    ? 31.6    ?TCO2 22 - 32 mmol/L  34    ? 34    ? 33    ?O2 Saturation %  73.0    ? 72.0    ? 99.0    ?  ? ? Multiple values from one day are sorted in reverse-chronological order  ?  ?  ? ? ?Capillary Blood Glucose: ?No results found for: GLUCAP ? ? ?Exercise Target Goals: ?Exercise Program Goal: ?Individual exercise prescription set using results from initial 6 min walk test and THRR while considering  patient?s activity barriers and safety.  ? ?Exercise Prescription Goal: ?Initial exercise prescription builds to 30-45 minutes a day of aerobic activity, 2-3 days per week.  Home exercise guidelines will be given to patient during program as part of exercise prescription that the participant will acknowledge. ? ?Activity Barriers & Risk Stratification: ? Activity Barriers & Cardiac Risk Stratification - 02/28/22 1025   ? ?  ? Activity Barriers & Cardiac Risk Stratification  ? Activity Barriers Other (comment);Shortness of Breath   ? Comments Lymphedema- both legs; bilateral knee pain.   ? Cardiac Risk Stratification High   ? ?  ?  ? ?  ? ? ?6 Minute Walk: ? 6 Minute Walk   ? ? [place] Name 02/28/22 1045  ?  ?  ?  ? 6 Minute Walk  ? Phase Initial    ? Distance 968 feet    ? Walk Time 6 minutes    ? # of Rest Breaks 0    ? MPH 1.83    ? METS 2.22    ? RPE 17    ? Perceived Dyspnea  1    ? VO2 Peak 7.76    ? Symptoms Yes (comment)    ? Comments Mild SOB, RPD=1, chronic bilateral knee pain "5/10"; bilatreal hip pain "4/10".    ? Resting HR 78 bpm    ? Resting BP 104/70    ? Resting Oxygen Saturation  97 %    ? Exercise Oxygen Saturation  during 6 min walk 98 %    ? Max Ex. HR 86 bpm    ? Max Ex. BP 138/78    ? 2 Minute Post BP 114/74    ? ?  ?  ? ?  ? ? ?Oxygen Initial Assessment: ? ? ?Oxygen Re-Evaluation: ? ? ?Oxygen Discharge (Final Oxygen Re-Evaluation): ? ? ?Initial Exercise Prescription: ? Initial Exercise Prescription - 02/28/22 1400   ? ?  ?  Date of Initial Exercise RX and Referring  Provider  ? Date 02/28/22   ? Referring Provider Nahser, Wonda Cheng, MD.   ? Expected Discharge Date 04/28/22   ?  ? T5 Nustep  ? Level 1   ? SPM 75   ? Minutes 20   ? METs 1.8   ?  ? Track  ? Laps 7   ? Minutes 10   ? METs 2.22   ?  ? Prescription Details  ? Frequency (times per week) 3   ? Duration Progress to 30 minutes of continuous aerobic without signs/symptoms of physical distress   ?  ? Intensity  ? THRR 40-80% of Max Heartrate [phone]   ? Ratings of Perceived Exertion 11-13   ? Perceived Dyspnea 0-4   ?  ? Progression  ? Progression Continue to progress workloads to maintain intensity without signs/symptoms of physical distress.   ?  ? Resistance Training  ? Training Prescription Yes   ? Weight 2 lbs   ? Reps 10-15   ? ?  ?  ? ?  ? ? ?Perform Capillary Blood Glucose checks as needed. ? ?Exercise Prescription Changes: ? ? ?Exercise Comments: ? ? ?Exercise Goals and Review: ? ? Exercise Goals   ? ? [place] Name 02/28/22 1033  ?  ?  ?  ?  ?  ? Exercise Goals  ? Increase Physical Activity Yes      ? Intervention Provide advice, education, support and counseling about physical activity/exercise needs.;Develop an individualized exercise prescription for aerobic and resistive training based on initial evaluation findings, risk stratification, comorbidities and participant's personal goals.      ? Expected Outcomes Short Term: Attend rehab on a regular basis to increase amount of physical activity.;Long Term: Exercising regularly at least 3-5 days a week.;Long Term: Add in home exercise to make exercise part of routine and to increase amount of physical activity.      ? Increase Strength and Stamina Yes      ? Intervention Provide advice, education, support and counseling about physical activity/exercise needs.;Develop an individualized exercise prescription for aerobic and resistive training based on initial evaluation findings, risk stratification, comorbidities and  participant's personal goals.      ? Expected Outcomes Short Term: Increase workloads from initial exercise prescription for resistance, speed, and METs.;Short Term: Perform resistance training exercises routin

## 2022-02-28 NOTE — Progress Notes (Signed)
Cardiac Rehab Medication Review by a Nurse ? ?Does the patient  feel that his/her medications are working for him/her?  YES ? ?Has the patient been experiencing any side effects to the medications prescribed?   NO ? ?Does the patient measure his/her own blood pressure or blood glucose at home?  YES  ? ?Does the patient have any problems obtaining medications due to transportation or finances?    NO ? ?Understanding of regimen: good ?Understanding of indications: good ?Potential of compliance: good ? ? ? ?Nurse  comments: Janet [name] is taking her medications as prescribed and has a good understanding of what his medications are for. Janet [name] says she feels better now that she is taking her heart failure medications. Janet [name] has a wrist BP monitor and will bring in next week for comparison. ? ? ? ?Harrell Gave RN ?02/28/2022 1:15 PM ?  ?

## 2022-03-06 ENCOUNTER — Encounter (HOSPITAL_COMMUNITY)
Admission: RE | Admit: 2022-03-06 | Discharge: 2022-03-06 | Disposition: A | Discharge status: Home / Self Care | Payer: Medicare Other | Source: Ambulatory Visit | Attending: Cardiovascular Disease | Admitting: Cardiovascular Disease

## 2022-03-06 VITALS — Ht 68.0 in | Wt 277.6 lb

## 2022-03-06 DIAGNOSIS — I428 Other cardiomyopathies: Secondary | ICD-10-CM | POA: Diagnosis not present

## 2022-03-06 DIAGNOSIS — I5022 Chronic systolic (congestive) heart failure: Secondary | ICD-10-CM

## 2022-03-06 NOTE — Progress Notes (Signed)
Daily Session Note ? ?Patient Details  ?Name: Janet [name] ?MRN: [id number] ?Date of Birth: 11/06/1966 ?Referring Provider:   ?Flowsheet Row CARDIAC REHAB PHASE II ORIENTATION from 02/28/2022 in [facility]  ?Referring Provider Nahser, Janet Cheng, MD.  ? ?  ? ? ?Encounter Date: 03/06/2022 ? ?Check In: ? Session Check In - 03/06/22 1037   ? ?  ? Check-In  ? Supervising physician immediately available to respond to emergencies [facility] immediately available   ? Physician(s) Dr. Pietro [name]   ? Location [facility]   ? Staff Present Janet [name] BS, ACSM EP-C, Exercise Physiologist;Janet Lias, RN, Environmental consultant, MS, ACSM CEP, Exercise Physiologist;Janet Intel, MS, ACSM-CEP, CCRP, Exercise Physiologist;Janet [name], Therapist, sports, BSN   ? Virtual Visit No   ? Medication changes reported     No   ? Fall or balance concerns reported    No   ? Tobacco Cessation No Change   ? Warm-up and Cool-down Performed as group-led instruction   ? Resistance Training Performed Yes   ? VAD Patient? No   ? PAD/SET Patient? No   ?  ? Pain Assessment  ? Currently in Pain? No/denies   ? Pain Score 0-No pain   ? Multiple Pain Sites No   ? ?  ?  ? ?  ? ? ?Capillary Blood Glucose: ?No results found for this or any previous visit (from the past 24 hour(s)). ? ? ? ?Social History  ? ?Tobacco Use  ?Smoking Status Never  ? Passive exposure: Yes  ?Smokeless Tobacco Never  ? ? ?Goals Met:  ?Exercise tolerated well ?No report of concerns or symptoms today ?Strength training completed today ? ?Goals Unmet:  ?Not Applicable ? ?Comments: Janet [name] started cardiac rehab today.  Pt tolerated light exercise without difficulty. VSS, telemetry-Sinus Rhythm with PVC's, asymptomatic.  Medication list reconciled. Pt denies barriers to medicaiton compliance.  PSYCHOSOCIAL ASSESSMENT:  PHQ-0. Pt exhibits positive coping skills, hopeful outlook with supportive family. No psychosocial needs identified at this  time, no psychosocial interventions necessary.  Janet [name] is somewhat deconditioned. Janet [name] is looking forward to participating in phase 2 cardiac rehab.   Pt oriented to exercise equipment and routine.    Understanding verbalized. Janet Pall, RN,BSN ?03/06/2022 4:37 PM  ? ? ?Dr. Fransico [name] is Medical Director for Cardiac Rehab at [facility]. ?

## 2022-03-06 NOTE — Progress Notes (Signed)
Janet [name] 56 y.o. adult ?Nutrition Note ?She is motivated to make lifestyle changes to aid with cardiac/pulmonary rehab. Patient has medical history of chronic systolic heart failure, HTN, DOE, lymphedema. She lives at home with her youngest daughter. They both do the grocery shopping and cooking; her daughter is very supportive of making lifestyle changes. She reports eating out frequently and using many canned foods.  ? ?Labs: GFR 57 ? ?Nutrition Diagnosis ?Morbid Obesity related to excessive energy intake as evidenced by a BM 42.2 ?Excessive sodium intake related to over consumption of processed food as evidenced by frequent consumption of convenience food/ canned vegetables and eating out frequently. ? ?Nutrition Intervention ?Pt?s individual nutrition plan reviewed with pt. ?Benefits of adopting Heart Healthy diet discussed.   ?Continue client-centered nutrition education by RD, as part of interdisciplinary care. ? ?Monitor/Evaluation: ?Patient reports motivation to make lifestyle changes for adherence to heart healthy diet recommendation and weight management. We discussed current diet intake and strategies for increasing fiber and decreasing sodium. Patient amicable to RD suggestions and verbalizes understanding. Will follow-up as needed.  ? ?8 minutes spent in review of topics related to a heart healthy diet including sodium intake, blood sugar control, weight management, and fiber intake. ? ?Goal(s) ?Patient will transition from regular canned food to "no salt added" products or frozen options ?Patient will increase vegetable to 1/2 the plate at lunch and dinner. Encouraged to order a side of vegetables when eating out.  ?Pt to identify and limit food sources of saturated fat, trans fat, refined carbohydrates and sodium ?Pt to identify food quantities necessary to achieve weight loss of 6-24 lb at graduation from cardiac rehab.  ?Pt able to name foods that affect blood glucose. Continue to limit  simple sugars, refined carbohydrates, sugary beverages, etc.  ?Pt to describe the benefit of including lean protein/plant proteins, fruits, vegetables, whole grains, nuts/seeds, and low-fat dairy products in a heart healthy meal plan. ?Pt to practice mindful and intuitive eating exercises ? ?Plan:  ?Will provide client-centered nutrition education as part of interdisciplinary care ?Monitor and evaluate progress toward nutrition goal with team. ? ? ?Aldona [place], MS, RDN, LDN ? ?

## 2022-03-06 NOTE — Progress Notes (Signed)
QUALITY OF LIFE SCORE REVIEW ? Pt completed Quality of Life survey as a participant in Cardiac Rehab.  Scores 21.0 or below are considered low.  Pt score very low in several areas Overall 13.27, Health and Function 11.23, socioeconomic 13.33, physiological and spiritual 11.5 , family 21.4 . Patient quality of life slightly altered by physical constraints which limits ability to perform as prior to recent cardiac illness.Cheresa denies being depressed but does have anxiety and stress concerns due to  health and finances. Tonyetta says she is receiving counseling once a month Brayley would like to go back to school and to work.   Offered emotional support and reassurance.  Will continue to monitor and intervene as necessary. Permission granted by Mateo Flow to forward to her primary care provider Durene Fruits NP.[name], RN,BSN ?03/06/2022 5:03 PM  ?

## 2022-03-07 ENCOUNTER — Ambulatory Visit
Admission: RE | Admit: 2022-03-07 | Discharge: 2022-03-07 | Disposition: A | Discharge status: Home / Self Care | Payer: Medicare Other | Source: Ambulatory Visit | Attending: Family | Admitting: Family

## 2022-03-07 DIAGNOSIS — Z1231 Encounter for screening mammogram for malignant neoplasm of breast: Secondary | ICD-10-CM | POA: Diagnosis not present

## 2022-03-08 ENCOUNTER — Encounter (HOSPITAL_COMMUNITY)
Admission: RE | Admit: 2022-03-08 | Discharge: 2022-03-08 | Disposition: A | Discharge status: Home / Self Care | Payer: Medicare Other | Source: Ambulatory Visit | Attending: Cardiovascular Disease | Admitting: Cardiovascular Disease

## 2022-03-08 DIAGNOSIS — I5022 Chronic systolic (congestive) heart failure: Secondary | ICD-10-CM

## 2022-03-08 DIAGNOSIS — I428 Other cardiomyopathies: Secondary | ICD-10-CM | POA: Diagnosis not present

## 2022-03-10 ENCOUNTER — Telehealth (HOSPITAL_COMMUNITY): Payer: Self-pay | Admitting: *Deleted

## 2022-03-10 ENCOUNTER — Encounter (HOSPITAL_COMMUNITY): Payer: Medicare Other

## 2022-03-10 NOTE — Telephone Encounter (Signed)
Janet [name] called to report that she was up all night and will not be able to attend exercise today.[name], RN,BSN ?03/10/2022 7:37 AM  ?

## 2022-03-13 ENCOUNTER — Encounter (HOSPITAL_COMMUNITY)
Admission: RE | Admit: 2022-03-13 | Discharge: 2022-03-13 | Disposition: A | Discharge status: Home / Self Care | Payer: Medicare Other | Source: Ambulatory Visit | Attending: Cardiovascular Disease | Admitting: Cardiovascular Disease

## 2022-03-13 DIAGNOSIS — I5022 Chronic systolic (congestive) heart failure: Secondary | ICD-10-CM | POA: Diagnosis not present

## 2022-03-13 DIAGNOSIS — I428 Other cardiomyopathies: Secondary | ICD-10-CM | POA: Diagnosis not present

## 2022-03-15 ENCOUNTER — Encounter (HOSPITAL_COMMUNITY)
Admission: RE | Admit: 2022-03-15 | Discharge: 2022-03-15 | Disposition: A | Discharge status: Home / Self Care | Payer: Medicare Other | Source: Ambulatory Visit | Attending: Cardiovascular Disease | Admitting: Cardiovascular Disease

## 2022-03-15 VITALS — BP 118/84 | Wt 279.1 lb

## 2022-03-15 DIAGNOSIS — I5022 Chronic systolic (congestive) heart failure: Secondary | ICD-10-CM

## 2022-03-15 DIAGNOSIS — I428 Other cardiomyopathies: Secondary | ICD-10-CM | POA: Diagnosis not present

## 2022-03-17 ENCOUNTER — Encounter (HOSPITAL_COMMUNITY): Payer: Medicare Other

## 2022-03-17 ENCOUNTER — Telehealth (HOSPITAL_COMMUNITY): Payer: Self-pay | Admitting: *Deleted

## 2022-03-17 NOTE — Telephone Encounter (Signed)
Renessa called to report she had a death in her family and will not be present for exercise today.[name], RN,BSN ?03/17/2022 10:08 AM  ?

## 2022-03-20 ENCOUNTER — Encounter (HOSPITAL_COMMUNITY): Payer: Medicare Other

## 2022-03-22 ENCOUNTER — Encounter (HOSPITAL_COMMUNITY): Payer: Medicare Other

## 2022-03-24 ENCOUNTER — Encounter (HOSPITAL_COMMUNITY): Payer: Medicare Other

## 2022-03-24 ENCOUNTER — Telehealth (HOSPITAL_COMMUNITY): Payer: Self-pay | Admitting: *Deleted

## 2022-03-24 NOTE — Telephone Encounter (Signed)
Left message to call cardiac rehab regarding cardiac rehab appointment.[name], RN,BSN ?03/24/2022 11:47 AM  ?

## 2022-03-27 ENCOUNTER — Encounter (HOSPITAL_COMMUNITY): Payer: Medicare Other

## 2022-03-29 ENCOUNTER — Encounter (HOSPITAL_COMMUNITY): Payer: Medicare Other

## 2022-03-29 NOTE — Progress Notes (Signed)
Cardiac Individual Treatment Plan ? ?Patient Details  ?Name: Janet [name] ?MRN: [id number] ?Date of Birth: [birth year] ?Referring Provider:   ?Flowsheet Row CARDIAC REHAB PHASE II ORIENTATION from 02/28/2022 in [facility]  ?Referring Provider Nahser, Wonda Cheng, MD.  ? ?  ? ? ?Initial Encounter Date:  ?Flowsheet Row CARDIAC REHAB PHASE II ORIENTATION from 02/28/2022 in [facility]  ?Date 02/28/22  ? ?  ? ? ?Visit Diagnosis: Heart failure, chronic systolic (HCC) ? ?Patient's Home Medications on Admission: ? ?Current Outpatient Medications:  ?  albuterol (PROVENTIL) (5 MG/ML) 0.5% nebulizer solution, Take 0.5 mLs (2.5 mg total) by nebulization every 6 (six) hours as needed for wheezing or shortness of breath. (Patient not taking: Reported on 03/06/2022), Disp: 20 mL, Rfl: 1 ?  albuterol (VENTOLIN HFA) 108 (90 Base) MCG/ACT inhaler, Inhale 1-2 puffs into the lungs every 6 (six) hours as needed for wheezing or shortness of breath., Disp: 1 each, Rfl: 1 ?  ARIPiprazole (ABILIFY) 10 MG tablet, Take 10 mg by mouth daily., Disp: , Rfl:  ?  aspirin 81 MG chewable tablet, Chew 81 mg by mouth daily. aspirin 81 mg chewable tablet, Disp: , Rfl:  ?  BLACK CURRANT SEED OIL PO, Take 5 mLs by mouth in the morning and at bedtime. (Patient not taking: Reported on 03/06/2022), Disp: , Rfl:  ?  Budeson-Glycopyrrol-Formoterol (BREZTRI AEROSPHERE) 160-9-4.8 MCG/ACT AERO, Inhale 2 puffs into the lungs in the morning and at bedtime., Disp: 10.7 g, Rfl: 5 ?  carvedilol (COREG) 3.125 MG tablet, Take 6.25 mg by mouth 2 (two) times daily with a meal., Disp: , Rfl:  ?  carvedilol (COREG) 6.25 MG tablet, Take 1 tablet (6.25 mg total) by mouth 2 (two) times daily., Disp: 180 tablet, Rfl: 3 ?  dapagliflozin propanediol (FARXIGA) 10 MG TABS tablet, Take 1 tablet (10 mg total) by mouth daily., Disp: 30 tablet, Rfl: 5 ?  fluticasone (FLONASE) 50 MCG/ACT nasal spray, Place 2 sprays into both  nostrils daily. (Patient taking differently: Place 2 sprays into both nostrils daily as needed for allergies.), Disp: 16 g, Rfl: 0 ?  furosemide (LASIX) 20 MG tablet, Take 1 tablet (20 mg total) by mouth daily., Disp: 30 tablet, Rfl: 5 ?  hydrOXYzine (ATARAX) 10 MG tablet, Take 1 tablet (10 mg total) by mouth 2 (two) times daily., Disp: 180 tablet, Rfl: 3 ?  ibuprofen (ADVIL) 600 MG tablet, Take 600 mg by mouth daily as needed for mild pain. (Patient not taking: Reported on 03/06/2022), Disp: , Rfl:  ?  ipratropium-albuterol (DUONEB) 0.5-2.5 (3) MG/3ML SOLN, Take 3 mLs by nebulization every 6 (six) hours as needed (wheezing, shortness of breath)., Disp: 360 mL, Rfl: 0 ?  montelukast (SINGULAIR) 10 MG tablet, Take 1 tablet (10 mg total) by mouth at bedtime., Disp: 90 tablet, Rfl: 0 ?  Multiple Vitamins-Minerals (ADULT ONE DAILY GUMMIES) CHEW, Chew 2 tablets by mouth daily with breakfast., Disp: , Rfl:  ?  sacubitril-valsartan (ENTRESTO) 24-26 MG, Take 2 tablets by mouth 2 (two) times daily., Disp: , Rfl:  ?  sacubitril-valsartan (ENTRESTO) 49-51 MG, Take 1 tablet by mouth 2 (two) times daily. (Patient not taking: Reported on 03/06/2022), Disp: 180 tablet, Rfl: 3 ?  spironolactone (ALDACTONE) 25 MG tablet, TAKE 1 TABLET(25 MG) BY MOUTH DAILY, Disp: 90 tablet, Rfl: 3 ?  traZODone (DESYREL) 150 MG tablet, Take 150 mg by mouth at bedtime., Disp: , Rfl:  ? ?Past Medical History: ?Past Medical  History:  ?Diagnosis Date  ? Asthma   ? CHF (congestive heart failure) ([place])   ? Congestive heart failure (CHF) ([place])   ? Depression   ? Elevated brain natriuretic peptide (BNP) level   ? Hypertension   ? Lymphedema   ? SOB (shortness of breath)   ? ? ?Tobacco Use: ?Social History  ? ?Tobacco Use  ?Smoking Status Never  ? Passive exposure: Yes  ?Smokeless Tobacco Never  ? ? ?Labs: ?Review Flowsheet   ? ?  ?  Latest Ref Rng & Units 01/12/2021 04/01/2021  ?Labs for ITP Cardiac and Pulmonary Rehab  ?Cholestrol 100 - 199 mg/dL 170     ?LDL  (calc) 0 - 99 mg/dL 79     ?HDL-C >39 mg/dL 74     ?Trlycerides 0 - 149 mg/dL 95     ?PH, Arterial 7.350 - 7.450  7.313    ?PCO2 arterial 32.0 - 48.0 mmHg  62.3    ?Bicarbonate 20.0 - 28.0 mmol/L  32.0    ? 32.4    ? 31.6    ?TCO2 22 - 32 mmol/L  34    ? 34    ? 33    ?O2 Saturation %  73.0    ? 72.0    ? 99.0    ?  ? ? Multiple values from one day are sorted in reverse-chronological order  ?  ?  ? ? ?Capillary Blood Glucose: ?No results found for: GLUCAP ? ? ?Exercise Target Goals: ?Exercise Program Goal: ?Individual exercise prescription set using results from initial 6 min walk test and THRR while considering  patient?s activity barriers and safety.  ? ?Exercise Prescription Goal: ?Initial exercise prescription builds to 30-45 minutes a day of aerobic activity, 2-3 days per week.  Home exercise guidelines will be given to patient during program as part of exercise prescription that the participant will acknowledge. ? ?Activity Barriers & Risk Stratification: ? Activity Barriers & Cardiac Risk Stratification - 02/28/22 1025   ? ?  ? Activity Barriers & Cardiac Risk Stratification  ? Activity Barriers Other (comment);Shortness of Breath   ? Comments Lymphedema- both legs; bilateral knee pain.   ? Cardiac Risk Stratification High   ? ?  ?  ? ?  ? ? ?6 Minute Walk: ? 6 Minute Walk   ? ? [place] Name 02/28/22 1045  ?  ?  ?  ? 6 Minute Walk  ? Phase Initial    ? Distance 968 feet    ? Walk Time 6 minutes    ? # of Rest Breaks 0    ? MPH 1.83    ? METS 2.22    ? RPE 17    ? Perceived Dyspnea  1    ? VO2 Peak 7.76    ? Symptoms Yes (comment)    ? Comments Mild SOB, RPD=1, chronic bilateral knee pain "5/10"; bilatreal hip pain "4/10".    ? Resting HR 78 bpm    ? Resting BP 104/70    ? Resting Oxygen Saturation  97 %    ? Exercise Oxygen Saturation  during 6 min walk 98 %    ? Max Ex. HR 86 bpm    ? Max Ex. BP 138/78    ? 2 Minute Post BP 114/74    ? ?  ?  ? ?  ? ? ?Oxygen Initial Assessment: ? ? ?Oxygen  Re-Evaluation: ? ? ?Oxygen Discharge (Final Oxygen Re-Evaluation): ? ? ?Initial Exercise Prescription: ?  Initial Exercise Prescription - 02/28/22 1400   ? ?  ? Date of Initial Exercise RX and Referring Provider  ? Date 02/28/22   ? Referring Provider Nahser, Wonda Cheng, MD.   ? Expected Discharge Date 04/28/22   ?  ? T5 Nustep  ? Level 1   ? SPM 75   ? Minutes 20   ? METs 1.8   ?  ? Track  ? Laps 7   ? Minutes 10   ? METs 2.22   ?  ? Prescription Details  ? Frequency (times per week) 3   ? Duration Progress to 30 minutes of continuous aerobic without signs/symptoms of physical distress   ?  ? Intensity  ? THRR 40-80% of Max Heartrate [phone]   ? Ratings of Perceived Exertion 11-13   ? Perceived Dyspnea 0-4   ?  ? Progression  ? Progression Continue to progress workloads to maintain intensity without signs/symptoms of physical distress.   ?  ? Resistance Training  ? Training Prescription Yes   ? Weight 2 lbs   ? Reps 10-15   ? ?  ?  ? ?  ? ? ?Perform Capillary Blood Glucose checks as needed. ? ?Exercise Prescription Changes: ? ? Exercise Prescription Changes   ? ? [place] Name 03/06/22 1021 03/13/22 1038 03/15/22 1026  ?  ?  ?  ? Response to Exercise  ? Blood Pressure (Admit) 118/82 104/74 118/84    ? Blood Pressure (Exercise) 122/70 118/78 146/74    ? Blood Pressure (Exit) 115/60 98/70 102/82    ? Heart Rate (Admit) 65 bpm 81 bpm 79 bpm    ? Heart Rate (Exercise) 103 bpm 108 bpm 110 bpm    ? Heart Rate (Exit) 75 bpm 81 bpm 73 bpm    ? Oxygen Saturation (Exercise) 98 % -- --    ? Rating of Perceived Exertion (Exercise) '13 13 13    '$ ? Perceived Dyspnea (Exercise) 0 -- --    ? Symptoms None None None    ? Comments Off to a good start with exercise. Seated rest break taken during walking station. -- --    ? Duration Progress to 30 minutes of  aerobic without signs/symptoms of physical distress Progress to 30 minutes of  aerobic without signs/symptoms of physical distress Progress to 30 minutes of  aerobic without signs/symptoms  of physical distress    ? Intensity THRR unchanged THRR unchanged THRR unchanged    ?  ? Progression  ? Progression Continue to progress workloads to maintain intensity without signs/symptoms of physical distress. Continue

## 2022-03-30 ENCOUNTER — Other Ambulatory Visit: Payer: Self-pay | Admitting: Family

## 2022-03-30 DIAGNOSIS — R928 Other abnormal and inconclusive findings on diagnostic imaging of breast: Secondary | ICD-10-CM

## 2022-03-31 ENCOUNTER — Telehealth (HOSPITAL_COMMUNITY): Payer: Self-pay | Admitting: *Deleted

## 2022-03-31 ENCOUNTER — Encounter (HOSPITAL_COMMUNITY): Payer: Medicare Other

## 2022-03-31 NOTE — Telephone Encounter (Signed)
Left message to call cardiac rehab will discharge from the program as Lella has not exercised at cardiac rehab in over two weeks. Aryani attended 4 exercise sessions between 03/06/22- 03/15/22 ?

## 2022-04-03 ENCOUNTER — Encounter (HOSPITAL_COMMUNITY): Payer: Medicare Other

## 2022-04-05 ENCOUNTER — Encounter (HOSPITAL_COMMUNITY): Payer: Medicare Other

## 2022-04-07 ENCOUNTER — Encounter (HOSPITAL_COMMUNITY): Payer: Medicare Other

## 2022-04-10 ENCOUNTER — Encounter (HOSPITAL_COMMUNITY): Payer: Medicare Other

## 2022-04-11 ENCOUNTER — Other Ambulatory Visit: Payer: Medicare Other

## 2022-04-11 ENCOUNTER — Encounter (HOSPITAL_COMMUNITY): Payer: Self-pay | Admitting: Cardiovascular Disease

## 2022-04-12 ENCOUNTER — Encounter (HOSPITAL_COMMUNITY): Payer: Medicare Other

## 2022-04-14 ENCOUNTER — Encounter (HOSPITAL_COMMUNITY): Payer: Medicare Other

## 2022-04-19 ENCOUNTER — Encounter (HOSPITAL_COMMUNITY): Payer: Medicare Other

## 2022-04-21 ENCOUNTER — Encounter (HOSPITAL_COMMUNITY): Payer: Medicare Other

## 2022-04-24 ENCOUNTER — Encounter (HOSPITAL_COMMUNITY): Payer: Medicare Other

## 2022-04-25 ENCOUNTER — Telehealth (HOSPITAL_COMMUNITY): Payer: Self-pay | Admitting: Cardiovascular Disease

## 2022-04-25 NOTE — Telephone Encounter (Signed)
I reached out to patient to update her on the purpose and need for ECHO. Pt apologized, stating she has been busy and recently got a new phone, but understands and does want to make the appt for the ECHO. Pt is also supposed to see Dr Acie Fredrickson approximately one week after that appt. Will route message to [facility] and Edmon Crape.

## 2022-04-25 NOTE — Telephone Encounter (Signed)
Just an FYI. We have made several attempts to contact this patient including sending a letter to schedule or reschedule their echocardiogram. We will be removing the patient from the echo [place].  04/11/22 MAILED LETTER [facility]  04/06/22 [facility] to schedule @ 3:07/[facility]  04/04/22 [facility] to schedule @ 11:19/[facility]      Thank you

## 2022-04-26 ENCOUNTER — Encounter (HOSPITAL_COMMUNITY): Payer: Medicare Other

## 2022-04-28 ENCOUNTER — Encounter (HOSPITAL_COMMUNITY): Payer: Medicare Other

## 2022-05-12 ENCOUNTER — Ambulatory Visit
Admission: RE | Admit: 2022-05-12 | Discharge: 2022-05-12 | Disposition: A | Discharge status: Home / Self Care | Payer: Medicare Other | Source: Ambulatory Visit | Attending: Family | Admitting: Family

## 2022-05-12 DIAGNOSIS — N6012 Diffuse cystic mastopathy of left breast: Secondary | ICD-10-CM | POA: Diagnosis not present

## 2022-05-12 DIAGNOSIS — R922 Inconclusive mammogram: Secondary | ICD-10-CM | POA: Diagnosis not present

## 2022-05-12 DIAGNOSIS — R928 Other abnormal and inconclusive findings on diagnostic imaging of breast: Secondary | ICD-10-CM

## 2022-05-16 ENCOUNTER — Ambulatory Visit (HOSPITAL_COMMUNITY): Payer: Medicare Other | Attending: Internal Medicine

## 2022-05-16 DIAGNOSIS — I5020 Unspecified systolic (congestive) heart failure: Secondary | ICD-10-CM | POA: Diagnosis not present

## 2022-05-17 LAB — ECHOCARDIOGRAM COMPLETE
Area-P 1/2: 2.46 cm2
P 1/2 time: 492 msec
S' Lateral: 4.8 cm

## 2022-05-19 ENCOUNTER — Other Ambulatory Visit: Payer: Self-pay

## 2022-05-19 DIAGNOSIS — J4540 Moderate persistent asthma, uncomplicated: Secondary | ICD-10-CM

## 2022-05-19 MED ORDER — ALBUTEROL SULFATE HFA 108 (90 BASE) MCG/ACT IN AERS: 1.0000 | INHALATION_SPRAY | Freq: Four times a day (QID) | RESPIRATORY_TRACT | 1 refills | Status: DC | PRN

## 2022-05-22 ENCOUNTER — Telehealth: Payer: Self-pay

## 2022-05-22 DIAGNOSIS — I77819 Aortic ectasia, unspecified site: Secondary | ICD-10-CM

## 2022-05-22 DIAGNOSIS — I351 Nonrheumatic aortic (valve) insufficiency: Secondary | ICD-10-CM

## 2022-05-22 DIAGNOSIS — I5020 Unspecified systolic (congestive) heart failure: Secondary | ICD-10-CM

## 2022-05-22 NOTE — Telephone Encounter (Signed)
Able to reach patient by calling daughter's number on DPR. States she has recently switched phones and new # is [phone]. Will update chart. Reviewed results and recommendations. Patient agrees to plan. Orders placed at this time.

## 2022-05-22 NOTE — Telephone Encounter (Signed)
-----   Message from Thayer Headings, MD sent at 05/18/2022  1:01 PM EDT ----- LV EF has improved slightly to 40%. Aortic insufficiency is moderate  Mildly dilatation of the ascending aorta   Dr. Margaretann Loveless has suggested a cardiac MRI to further evaluate her aortic insufficiency  Please refer to the [facility]F clinic for further evaluation of her CHF

## 2022-05-30 ENCOUNTER — Ambulatory Visit: Payer: Self-pay

## 2022-05-30 ENCOUNTER — Telehealth: Payer: Medicare Other | Admitting: Physician Assistant

## 2022-05-30 DIAGNOSIS — B3731 Acute candidiasis of vulva and vagina: Secondary | ICD-10-CM | POA: Diagnosis not present

## 2022-05-30 DIAGNOSIS — H1031 Unspecified acute conjunctivitis, right eye: Secondary | ICD-10-CM | POA: Diagnosis not present

## 2022-05-30 MED ORDER — POLYMYXIN B-TRIMETHOPRIM 10000-0.1 UNIT/ML-% OP SOLN: OPHTHALMIC | 0 refills | Status: DC

## 2022-05-30 MED ORDER — FLUCONAZOLE 150 MG PO TABS
150.0000 mg | ORAL_TABLET | Freq: Once | ORAL | 0 refills | Status: AC
Start: 2022-05-30 — End: 2022-05-30

## 2022-05-30 NOTE — Telephone Encounter (Signed)
No available appts this week or until 07/20.Marland Kitchen pt can visit Mobile Medicine unit at [facility] [address] [place] [address] on tomorrow 05/31/22 for evaluation

## 2022-05-30 NOTE — Patient Instructions (Signed)
Janet [name], thank you for joining Leeanne Rio, PA-C for today's virtual visit.  While this provider is not your primary care provider (PCP), if your PCP is located in our provider database this encounter information will be shared with them immediately following your visit.  Consent: (Patient) Janet [name] provided verbal consent for this virtual visit at the beginning of the encounter.  Current Medications:  Current Outpatient Medications:    albuterol (PROVENTIL) (5 MG/ML) 0.5% nebulizer solution, Take 0.5 mLs (2.5 mg total) by nebulization every 6 (six) hours as needed for wheezing or shortness of breath. (Patient not taking: Reported on 03/06/2022), Disp: 20 mL, Rfl: 1   albuterol (VENTOLIN HFA) 108 (90 Base) MCG/ACT inhaler, Inhale 1-2 puffs into the lungs every 6 (six) hours as needed for wheezing or shortness of breath., Disp: 1 each, Rfl: 1   ARIPiprazole (ABILIFY) 10 MG tablet, Take 10 mg by mouth daily., Disp: , Rfl:    aspirin 81 MG chewable tablet, Chew 81 mg by mouth daily. aspirin 81 mg chewable tablet, Disp: , Rfl:    BLACK CURRANT SEED OIL PO, Take 5 mLs by mouth in the morning and at bedtime. (Patient not taking: Reported on 03/06/2022), Disp: , Rfl:    Budeson-Glycopyrrol-Formoterol (BREZTRI AEROSPHERE) 160-9-4.8 MCG/ACT AERO, Inhale 2 puffs into the lungs in the morning and at bedtime., Disp: 10.7 g, Rfl: 5   carvedilol (COREG) 3.125 MG tablet, Take 6.25 mg by mouth 2 (two) times daily with a meal., Disp: , Rfl:    carvedilol (COREG) 6.25 MG tablet, Take 1 tablet (6.25 mg total) by mouth 2 (two) times daily., Disp: 180 tablet, Rfl: 3   dapagliflozin propanediol (FARXIGA) 10 MG TABS tablet, Take 1 tablet (10 mg total) by mouth daily., Disp: 30 tablet, Rfl: 5   fluticasone (FLONASE) 50 MCG/ACT nasal spray, Place 2 sprays into both nostrils daily. (Patient taking differently: Place 2 sprays into both nostrils daily as needed for allergies.), Disp: 16 g, Rfl:  0   furosemide (LASIX) 20 MG tablet, Take 1 tablet (20 mg total) by mouth daily., Disp: 30 tablet, Rfl: 5   hydrOXYzine (ATARAX) 10 MG tablet, Take 1 tablet (10 mg total) by mouth 2 (two) times daily., Disp: 180 tablet, Rfl: 3   ibuprofen (ADVIL) 600 MG tablet, Take 600 mg by mouth daily as needed for mild pain. (Patient not taking: Reported on 03/06/2022), Disp: , Rfl:    ipratropium-albuterol (DUONEB) 0.5-2.5 (3) MG/3ML SOLN, Take 3 mLs by nebulization every 6 (six) hours as needed (wheezing, shortness of breath)., Disp: 360 mL, Rfl: 0   montelukast (SINGULAIR) 10 MG tablet, Take 1 tablet (10 mg total) by mouth at bedtime., Disp: 90 tablet, Rfl: 0   Multiple Vitamins-Minerals (ADULT ONE DAILY GUMMIES) CHEW, Chew 2 tablets by mouth daily with breakfast., Disp: , Rfl:    sacubitril-valsartan (ENTRESTO) 24-26 MG, Take 2 tablets by mouth 2 (two) times daily., Disp: , Rfl:    sacubitril-valsartan (ENTRESTO) 49-51 MG, Take 1 tablet by mouth 2 (two) times daily. (Patient not taking: Reported on 03/06/2022), Disp: 180 tablet, Rfl: 3   spironolactone (ALDACTONE) 25 MG tablet, TAKE 1 TABLET(25 MG) BY MOUTH DAILY, Disp: 90 tablet, Rfl: 3   traZODone (DESYREL) 150 MG tablet, Take 150 mg by mouth at bedtime., Disp: , Rfl:    Medications ordered in this encounter:  No orders of the defined types were placed in this encounter.    *If you need refills on other medications prior  to your next appointment, please contact your pharmacy*  Follow-Up: Call back or seek an in-person evaluation if the symptoms worsen or if the condition fails to improve as anticipated.  Other Instructions   If you have been instructed to have an in-person evaluation today at a local Urgent Care facility, please use the link below. It will take you to a list of all of our available [facility], including address, phone number and hours of operation. Please do not delay care.  [facility]  If you or a  family member do not have a primary care provider, use the link below to schedule a visit and establish care. When you choose a [facility] primary care physician or advanced practice provider, you gain a long-term partner in health. Find a Primary Care Provider  Learn more about [facility]'s in-office and virtual care options: [facility]

## 2022-05-30 NOTE — Progress Notes (Signed)
Virtual Visit Consent   KC SEDLAK, you are scheduled for a virtual visit with a [facility] provider today. Just as with appointments in the office, your consent must be obtained to participate. Your consent will be active for this visit and any virtual visit you may have with one of our providers in the next 365 days. If you have a MyChart account, a copy of this consent can be sent to you electronically.  As this is a virtual visit, video technology does not allow for your provider to perform a traditional examination. This may limit your provider's ability to fully assess your condition. If your provider identifies any concerns that need to be evaluated in person or the need to arrange testing (such as labs, EKG, etc.), we will make arrangements to do so. Although advances in technology are sophisticated, we cannot ensure that it will always work on either your end or our end. If the connection with a video visit is poor, the visit may have to be switched to a telephone visit. With either a video or telephone visit, we are not always able to ensure that we have a secure connection.  By engaging in this virtual visit, you consent to the provision of healthcare and authorize for your insurance to be billed (if applicable) for the services provided during this visit. Depending on your insurance coverage, you may receive a charge related to this service.  I need to obtain your verbal consent now. Are you willing to proceed with your visit today? Janet [name] has provided verbal consent on 05/30/2022 for a virtual visit (video or telephone). Janet [name], [place]  Date: 05/30/2022 2:45 PM  Virtual Visit via Video Note   I, Janet [name], connected with  Janet [name]  ([id number], 11/13/1966) on 05/30/22 at  2:15 PM EDT by a video-enabled telemedicine application and verified that I am speaking with the correct person using two identifiers.  Location: Patient: Virtual  Visit Location Patient: Home Provider: Virtual Visit Location Provider: Home Office   I discussed the limitations of evaluation and management by telemedicine and the availability of in person appointments. The patient expressed understanding and agreed to proceed.    History of Present Illness: Janet [name] is a 56 y.o. who identifies as a female who was assigned adult at birth, and is being seen today for irritation and redness of the R eye starting yesterday along with some itch. Notes symptoms started after getting pool water in her eye. Notes drainage this morning and matting. Denies vision changes. Has been applying compresses to the eye.  Denies symptoms of L eye. Does not wear corrective lenses.   Also notes vaginal itching and discomfort over past couple of days. Is concerned about a yeast infection. Is sexually active with 1 female partner, denying concern for STI. Denies urinary changes. Denies dyspareunia. Is on Farxiga for her DM II.   HPI: HPI  Problems:  Patient Active Problem List   Diagnosis Date Noted   Acute systolic heart failure ([place])    Essential hypertension 02/17/2021   Asthma    Lymphedema    Elevated brain natriuretic peptide (BNP) level    DOE (dyspnea on exertion) 07/28/2020   Morbid obesity ([place]) 07/28/2020    Allergies:  Allergies  Allergen Reactions   Clarithromycin Other (See Comments)    GI upset   Penicillins Rash and Hives   Medications:  Current Outpatient Medications:    fluconazole (DIFLUCAN) 150 MG tablet,  Take 1 tablet (150 mg total) by mouth once for 1 dose., Disp: 1 tablet, Rfl: 0   trimethoprim-polymyxin b (POLYTRIM) ophthalmic solution, Apply 1-2 drops into affected eye QID x 5 days., Disp: 10 mL, Rfl: 0   albuterol (PROVENTIL) (5 MG/ML) 0.5% nebulizer solution, Take 0.5 mLs (2.5 mg total) by nebulization every 6 (six) hours as needed for wheezing or shortness of breath. (Patient not taking: Reported on 03/06/2022), Disp: 20 mL, Rfl:  1   albuterol (VENTOLIN HFA) 108 (90 Base) MCG/ACT inhaler, Inhale 1-2 puffs into the lungs every 6 (six) hours as needed for wheezing or shortness of breath., Disp: 1 each, Rfl: 1   ARIPiprazole (ABILIFY) 10 MG tablet, Take 10 mg by mouth daily., Disp: , Rfl:    aspirin 81 MG chewable tablet, Chew 81 mg by mouth daily. aspirin 81 mg chewable tablet, Disp: , Rfl:    BLACK CURRANT SEED OIL PO, Take 5 mLs by mouth in the morning and at bedtime. (Patient not taking: Reported on 03/06/2022), Disp: , Rfl:    Budeson-Glycopyrrol-Formoterol (BREZTRI AEROSPHERE) 160-9-4.8 MCG/ACT AERO, Inhale 2 puffs into the lungs in the morning and at bedtime., Disp: 10.7 g, Rfl: 5   carvedilol (COREG) 3.125 MG tablet, Take 6.25 mg by mouth 2 (two) times daily with a meal., Disp: , Rfl:    carvedilol (COREG) 6.25 MG tablet, Take 1 tablet (6.25 mg total) by mouth 2 (two) times daily., Disp: 180 tablet, Rfl: 3   dapagliflozin propanediol (FARXIGA) 10 MG TABS tablet, Take 1 tablet (10 mg total) by mouth daily., Disp: 30 tablet, Rfl: 5   fluticasone (FLONASE) 50 MCG/ACT nasal spray, Place 2 sprays into both nostrils daily. (Patient taking differently: Place 2 sprays into both nostrils daily as needed for allergies.), Disp: 16 g, Rfl: 0   furosemide (LASIX) 20 MG tablet, Take 1 tablet (20 mg total) by mouth daily., Disp: 30 tablet, Rfl: 5   hydrOXYzine (ATARAX) 10 MG tablet, Take 1 tablet (10 mg total) by mouth 2 (two) times daily., Disp: 180 tablet, Rfl: 3   ibuprofen (ADVIL) 600 MG tablet, Take 600 mg by mouth daily as needed for mild pain. (Patient not taking: Reported on 03/06/2022), Disp: , Rfl:    ipratropium-albuterol (DUONEB) 0.5-2.5 (3) MG/3ML SOLN, Take 3 mLs by nebulization every 6 (six) hours as needed (wheezing, shortness of breath)., Disp: 360 mL, Rfl: 0   montelukast (SINGULAIR) 10 MG tablet, Take 1 tablet (10 mg total) by mouth at bedtime., Disp: 90 tablet, Rfl: 0   Multiple Vitamins-Minerals (ADULT ONE DAILY  GUMMIES) CHEW, Chew 2 tablets by mouth daily with breakfast., Disp: , Rfl:    sacubitril-valsartan (ENTRESTO) 24-26 MG, Take 2 tablets by mouth 2 (two) times daily., Disp: , Rfl:    sacubitril-valsartan (ENTRESTO) 49-51 MG, Take 1 tablet by mouth 2 (two) times daily. (Patient not taking: Reported on 03/06/2022), Disp: 180 tablet, Rfl: 3   spironolactone (ALDACTONE) 25 MG tablet, TAKE 1 TABLET(25 MG) BY MOUTH DAILY, Disp: 90 tablet, Rfl: 3   traZODone (DESYREL) 150 MG tablet, Take 150 mg by mouth at bedtime., Disp: , Rfl:   Observations/Objective: Patient is well-developed, well-nourished in no acute distress.  Resting comfortably at home.  Head is normocephalic, atraumatic.  No labored breathing. Speech is clear and coherent with logical content.  Patient is alert and oriented at baseline.  R eye with noted conjunctival injection and some crusting of eyelashes noted. Left conjunctiva within normal limits. Pupils are equal and round EOMI.  Assessment and Plan: 1. Acute bacterial conjunctivitis of right eye - trimethoprim-polymyxin b (POLYTRIM) ophthalmic solution; Apply 1-2 drops into affected eye QID x 5 days.  Dispense: 10 mL; Refill: 0  Continue warm compresses. Good hand hygiene reviewed. Rx Polytrim OP.   2. Yeast vaginitis - fluconazole (DIFLUCAN) 150 MG tablet; Take 1 tablet (150 mg total) by mouth once for 1 dose.  Dispense: 1 tablet; Refill: 0  Diflucan x 1. Supportive measures reviewed. Will need in-person evaluation if not resolving.   Follow Up Instructions: I discussed the assessment and treatment plan with the patient. The patient was provided an opportunity to ask questions and all were answered. The patient agreed with the plan and demonstrated an understanding of the instructions.  A copy of instructions were sent to the patient via MyChart unless otherwise noted below.   The patient was advised to call back or seek an in-person evaluation if the symptoms worsen or if the  condition fails to improve as anticipated.  Time:  I spent 10 minutes with the patient via telehealth technology discussing the above problems/concerns.    Janet Rio, PA-C

## 2022-05-30 NOTE — Telephone Encounter (Signed)
Chief Complaint: Right eye redness, discharge and redness, vaginal itching Symptoms: ibid Frequency: Eye problem started last night/this morning. Vaginal itching started last week Pertinent Negatives: Patient denies  Disposition: '[]'$ ED /'[]'$ Urgent Care (no appt availability in office) / '[x]'$ Appointment(In office/virtual)/ '[]'$  [facility]/ '[]'$ Home Care/ '[]'$ Refused Recommended Disposition /'[]'$ [facility]/ '[]'$  Follow-up with PCP Additional Notes: Pt woke up with rt eye pain, redness and discharge. PT used cool wet compresses and eye feels better, but is still red.   PT started with vaginal itching last week.     Summary: Possible Pink eye + Yeast infection   Pt called requesting an appt for a possible yeast infection and also pink eye. No appt available soon enough, wants to discuss same-day options      Reason for Disposition  [1] Vulvar itching AND [2] not improved > 3 days following CARE ADVICE  Answer Assessment - Initial Assessment Questions 1. EYE DISCHARGE: "Is the discharge in one or both eyes?" "What color is it?" "How much is there?" "When did the discharge start?"      Right eye 2. REDNESS OF SCLERA: "Is the redness in one or both eyes?" "When did the redness start?"      yes 3. EYELIDS: "Are the eyelids red or swollen?" If Yes, ask: "How much?"      no 4. VISION: "Is there any difficulty seeing clearly?"      no 5. PAIN: "Is there any pain? If Yes, ask: "How bad is it?" (Scale 1-10; or mild, moderate, severe)    - MILD (1-3): doesn't interfere with normal activities     - MODERATE (4-7): interferes with normal activities or awakens from sleep    - SEVERE (8-10): excruciating pain, unable to do any normal activities       Not now 6. CONTACT LENS: "Do you wear contacts?"     no 7. OTHER SYMPTOMS: "Do you have any other symptoms?" (e.g., fever, runny nose, cough)     no 8. PREGNANCY: "Is there any chance you are pregnant?" "When was your last menstrual  period?"     na  Answer Assessment - Initial Assessment Questions 1. DISCHARGE: "Describe the discharge." (e.g., white, yellow, green, gray, foamy, cottage cheese-like)     none 2. ODOR: "Is there a bad odor?"     none 3. ONSET: "When did the discharge begin?"     na 4. RASH: "Is there a rash in that area?" If Yes, ask: "Describe it." (e.g., redness, blisters, sores, bumps)     itchy 5. ABDOMINAL PAIN: "Are you having any abdominal pain?" If Yes, ask: "What does it feel like? " (e.g., crampy, dull, intermittent, constant)      na 6. ABDOMINAL PAIN SEVERITY: If present, ask: "How bad is it?"  (e.g., mild, moderate, severe)  - MILD - doesn't interfere with normal activities   - MODERATE - interferes with normal activities or awakens from sleep   - SEVERE - patient doesn't want to move (R/O peritonitis)      na 7. CAUSE: "What do you think is causing the discharge?" "Have you had the same problem before? What happened then?"     Yeast infection 8. OTHER SYMPTOMS: "Do you have any other symptoms?" (e.g., fever, itching, vaginal bleeding, pain with urination, injury to genital area, vaginal foreign body)     no 9. PREGNANCY: "Is there any chance you are pregnant?" "When was your last menstrual period?"     na  Answer  Assessment - Initial Assessment Questions 1. SYMPTOM: "What's the main symptom you're concerned about?" (e.g., pain, itching, dryness)     itching 2. LOCATION: "Where is the  itching located?" (e.g., inside/outside, left/right)     Outside of vagina 3. ONSET: "When did the  itching  start?"     A few days ago 4. PAIN: "Is there any pain?" If Yes, ask: "How bad is it?" (Scale: 1-10; mild, moderate, severe)     no 5. ITCHING: "Is there any itching?" If Yes, ask: "How bad is it?" (Scale: 1-10; mild, moderate, severe)     moderate 6. CAUSE: "What do you think is causing the discharge?" "Have you had the same problem before? What happened then?"     yeast 7. OTHER SYMPTOMS:  "Do you have any other symptoms?" (e.g., fever, itching, vaginal bleeding, pain with urination, injury to genital area, vaginal foreign body)     no 8. PREGNANCY: "Is there any chance you are pregnant?" "When was your last menstrual period?"     na  Protocols used: Eye - Pus or Discharge-A-AH, Vaginal Discharge-A-AH, Vaginal Symptoms-A-AH, Vulvar Symptoms-A-AH

## 2022-05-31 NOTE — Telephone Encounter (Signed)
Appears that pt had a video visit on 05-30-22 in the afternoon for symptoms

## 2022-06-02 ENCOUNTER — Ambulatory Visit (HOSPITAL_COMMUNITY)
Admission: EM | Admit: 2022-06-02 | Discharge: 2022-06-02 | Disposition: A | Discharge status: Home / Self Care | Payer: Medicare Other | Attending: Family Medicine | Admitting: Family Medicine

## 2022-06-02 ENCOUNTER — Encounter (HOSPITAL_COMMUNITY): Payer: Self-pay | Admitting: Emergency Medicine

## 2022-06-02 DIAGNOSIS — Z113 Encounter for screening for infections with a predominantly sexual mode of transmission: Secondary | ICD-10-CM | POA: Insufficient documentation

## 2022-06-02 DIAGNOSIS — N898 Other specified noninflammatory disorders of vagina: Secondary | ICD-10-CM

## 2022-06-02 LAB — HIV ANTIBODY (ROUTINE TESTING W REFLEX): HIV Screen 4th Generation wRfx: NONREACTIVE

## 2022-06-02 MED ORDER — FLUCONAZOLE 150 MG PO TABS: 150.0000 mg | ORAL_TABLET | ORAL | 0 refills | Status: DC

## 2022-06-02 NOTE — ED Triage Notes (Signed)
Pt reports had intercourse about month ago for first time in years and having vaginal discharge and irritation and would like to be tested for STDs.

## 2022-06-02 NOTE — ED Provider Notes (Signed)
Janet [name]    CSN: [id number] Arrival date & time: 06/02/22  8527      History   Chief Complaint Chief Complaint  Patient presents with   Vaginal Discharge    HPI Janet [name] is a 56 y.o. adult.   HPI Patient with a history of diabetes managed by [place] presents today with 5 days of worsening vaginal irritation.  She had a ED visit 3 days ago however has been unable to pick up her prescribed Diflucan.  She endorses some changes in vaginal discharge, slight odor, but persistent itching.  She recently had sex for the first time around a month ago and endorses some vaginal dryness during the sexual intercourse however would like to be tested for STDs.  Denies any known exposure.  Denies any UTI symptoms. Past Medical History:  Diagnosis Date   Asthma    CHF (congestive heart failure) (HCC)    Congestive heart failure (CHF) (HCC)    Depression    Elevated brain natriuretic peptide (BNP) level    Hypertension    Lymphedema    SOB (shortness of breath)     Patient Active Problem List   Diagnosis Date Noted   Acute systolic heart failure ([place])    Essential hypertension 02/17/2021   Asthma    Lymphedema    Elevated brain natriuretic peptide (BNP) level    DOE (dyspnea on exertion) 07/28/2020   Morbid obesity ([place]) 07/28/2020    Past Surgical History:  Procedure Laterality Date   ABDOMINAL HYSTERECTOMY     BREAST BIOPSY Right    CESAREAN SECTION     HERNIA REPAIR     umbilical as a child   RIGHT/LEFT HEART CATH AND CORONARY ANGIOGRAPHY N/A 04/01/2021   Procedure: RIGHT/LEFT HEART CATH AND CORONARY ANGIOGRAPHY;  Surgeon: Jettie Booze, MD;  Location: [facility];  Service: Cardiovascular;  Laterality: N/A;   TUBAL LIGATION      OB History   No obstetric history on file.      Home Medications    Prior to Admission medications   Medication Sig Start Date End Date Taking? Authorizing Provider  fluconazole (DIFLUCAN) 150 MG tablet  Take 1 tablet (150 mg total) by mouth every 3 (three) days. 06/02/22  Yes Scot Jun, FNP  albuterol (PROVENTIL) (5 MG/ML) 0.5% nebulizer solution Take 0.5 mLs (2.5 mg total) by nebulization every 6 (six) hours as needed for wheezing or shortness of breath. Patient not taking: Reported on 03/06/2022 03/21/21   Camillia Herter, NP  albuterol (VENTOLIN HFA) 108 (90 Base) MCG/ACT inhaler Inhale 1-2 puffs into the lungs every 6 (six) hours as needed for wheezing or shortness of breath. 05/19/22   Camillia Herter, NP  ARIPiprazole (ABILIFY) 10 MG tablet Take 10 mg by mouth daily.    [provider]  aspirin 81 MG chewable tablet Chew 81 mg by mouth daily. aspirin 81 mg chewable tablet    [provider]  BLACK CURRANT SEED OIL PO Take 5 mLs by mouth in the morning and at bedtime. Patient not taking: Reported on 03/06/2022    [provider]  Budeson-Glycopyrrol-Formoterol (BREZTRI AEROSPHERE) 160-9-4.8 MCG/ACT AERO Inhale 2 puffs into the lungs in the morning and at bedtime. 06/08/21   Hunsucker, Bonna Gains, MD  carvedilol (COREG) 3.125 MG tablet Take 6.25 mg by mouth 2 (two) times daily with a meal.    [provider]  carvedilol (COREG) 6.25 MG tablet Take 1 tablet (6.25  mg total) by mouth 2 (two) times daily. 02/03/22   Nahser, Wonda Cheng, MD  dapagliflozin propanediol (FARXIGA) 10 MG TABS tablet Take 1 tablet (10 mg total) by mouth daily. 12/26/21   Nahser, Wonda Cheng, MD  fluticasone (FLONASE) 50 MCG/ACT nasal spray Place 2 sprays into both nostrils daily. Patient taking differently: Place 2 sprays into both nostrils daily as needed for allergies. 03/21/21   Camillia Herter, NP  furosemide (LASIX) 20 MG tablet Take 1 tablet (20 mg total) by mouth daily. 12/26/21 06/24/22  Nahser, Wonda Cheng, MD  hydrOXYzine (ATARAX) 10 MG tablet Take 1 tablet (10 mg total) by mouth 2 (two) times daily. 12/26/21   Nahser, Wonda Cheng, MD  ibuprofen (ADVIL) 600 MG tablet Take 600 mg by mouth daily as  needed for mild pain. Patient not taking: Reported on 03/06/2022 10/06/20   [provider]  ipratropium-albuterol (DUONEB) 0.5-2.5 (3) MG/3ML SOLN Take 3 mLs by nebulization every 6 (six) hours as needed (wheezing, shortness of breath). 04/03/21   [place] Eric J, DO  montelukast (SINGULAIR) 10 MG tablet Take 1 tablet (10 mg total) by mouth at bedtime. 09/09/21   Camillia Herter, NP  Multiple Vitamins-Minerals (ADULT ONE DAILY GUMMIES) CHEW Chew 2 tablets by mouth daily with breakfast.    [provider]  sacubitril-valsartan (ENTRESTO) 24-26 MG Take 2 tablets by mouth 2 (two) times daily.    [provider]  sacubitril-valsartan (ENTRESTO) 49-51 MG Take 1 tablet by mouth 2 (two) times daily. Patient not taking: Reported on 03/06/2022 02/03/22   Nahser, Wonda Cheng, MD  spironolactone (ALDACTONE) 25 MG tablet TAKE 1 TABLET(25 MG) BY MOUTH DAILY 02/27/22   Nahser, Wonda Cheng, MD  traZODone (DESYREL) 150 MG tablet Take 150 mg by mouth at bedtime.    [provider]  trimethoprim-polymyxin b (POLYTRIM) ophthalmic solution Apply 1-2 drops into affected eye QID x 5 days. 05/30/22   Brunetta Jeans, PA-C    Family History Family History  Problem Relation Age of Onset   Hypertension Mother    Stroke Mother    Deep vein thrombosis Mother    Congestive Heart Failure Mother    Other Father        history unknown   Obesity Sister    Obesity Brother    Congestive Heart Failure Maternal Grandmother     Social History Social History   Tobacco Use   Smoking status: Never    Passive exposure: Yes   Smokeless tobacco: Never  Vaping Use   Vaping Use: Never used  Substance Use Topics   Alcohol use: Yes    Comment: occasional   Drug use: Not Currently    Types: Marijuana    Comment: uses marijuana twice a week     Allergies   Clarithromycin and Penicillins   Review of Systems Review of Systems Pertinent negatives listed in HPI   Physical Exam Triage Vital  Signs ED Triage Vitals  Enc Vitals Group     BP 06/02/22 0846 (!) 133/93     Pulse Rate 06/02/22 0846 67     Resp 06/02/22 0846 19     Temp 06/02/22 0846 98.4 F (36.9 C)     Temp Source 06/02/22 0846 Oral     SpO2 06/02/22 0846 98 %     Weight --      Height --      Head Circumference --      Peak Flow --      Pain  Score 06/02/22 0845 0     Pain Loc --      Pain Edu? --      Excl. in [place]? --    No data found.  Updated Vital Signs BP (!) 133/93 (BP Location: Right Arm)   Pulse 67   Temp 98.4 F (36.9 C) (Oral)   Resp 19   SpO2 98%   Visual Acuity Right Eye Distance:   Left Eye Distance:   Bilateral Distance:    Right Eye Near:   Left Eye Near:    Bilateral Near:     Physical Exam Vitals reviewed.  Constitutional:      Appearance: Normal appearance.  HENT:     Head: Normocephalic and atraumatic.  Eyes:     Extraocular Movements: Extraocular movements intact.     Pupils: Pupils are equal, round, and reactive to light.  Cardiovascular:     Rate and Rhythm: Normal rate and regular rhythm.  Pulmonary:     Effort: Pulmonary effort is normal.     Breath sounds: Normal breath sounds.  Skin:    General: Skin is warm and dry.     Capillary Refill: Capillary refill takes less than 2 seconds.  Neurological:     General: No focal deficit present.     Mental Status: She is alert.  Psychiatric:        Mood and Affect: Mood normal.        Behavior: Behavior normal.   Vaginal cytology pending  UC Treatments / Results  Labs (all labs ordered are listed, but only abnormal results are displayed) Labs Reviewed  HIV ANTIBODY (ROUTINE TESTING W REFLEX)  RPR  CERVICOVAGINAL ANCILLARY ONLY    EKG   Radiology No results found.  Procedures Procedures (including critical care time)  Medications Ordered in UC Medications - No data to display  Initial Impression / Assessment and Plan / UC Course  I have reviewed the triage vital signs and the nursing  notes.  Pertinent labs & imaging results that were available during my care of the patient were reviewed by me and considered in my medical decision making (see chart for details).    Vaginal irritation Screen for STDs Given treatment of diabetes with Wilder Glade I am extending treatment of Diflucan to 3 doses 1 dose every 3 days.  Advised patient to pick up the dose that she received from her telehealth visit take in the 2 doses that I prescribed today and take 1 dose every 3 days until complete.  Vaginal cytology and HIV and RPR results will be available within 24 to 48 hours.  Advised that results will update to MyChart.  Our office will contact her if any additional treatment is warranted.  Return as needed. Final Clinical Impressions(s) / UC Diagnoses   Final diagnoses:  Vaginal irritation  Screen for STD (sexually transmitted disease)     Discharge Instructions      Your test will result within 24 to 48 hours.  If any treatment is warranted our office will contact you directly.  Given that you are taking Wilder Glade I am prescribing an additional 2 doses of Diflucan.  I would like for you to take the dose that you received 2 days ago first and then take another dose in 3 days followed by a subsequent dose in 3 days.     ED Prescriptions     Medication Sig Dispense Auth. Provider   fluconazole (DIFLUCAN) 150 MG tablet Take 1 tablet (150 mg total)  by mouth every 3 (three) days. 2 tablet Scot Jun, FNP      PDMP not reviewed this encounter.   Scot Jun, FNP 06/02/22 1002

## 2022-06-02 NOTE — Discharge Instructions (Addendum)
Your test will result within 24 to 48 hours.  If any treatment is warranted our office will contact you directly.  Given that you are taking Wilder Glade I am prescribing an additional 2 doses of Diflucan.  I would like for you to take the dose that you received 2 days ago first and then take another dose in 3 days followed by a subsequent dose in 3 days.

## 2022-06-03 LAB — RPR: RPR Ser Ql: NONREACTIVE

## 2022-06-05 LAB — CERVICOVAGINAL ANCILLARY ONLY
Bacterial Vaginitis (gardnerella): NEGATIVE
Candida Glabrata: NEGATIVE
Candida Vaginitis: NEGATIVE
Chlamydia: NEGATIVE
Comment: NEGATIVE
Comment: NEGATIVE
Comment: NEGATIVE
Comment: NEGATIVE
Comment: NEGATIVE
Comment: NORMAL
Neisseria Gonorrhea: NEGATIVE
Trichomonas: POSITIVE — AB

## 2022-06-06 ENCOUNTER — Telehealth (HOSPITAL_COMMUNITY): Payer: Self-pay | Admitting: Emergency Medicine

## 2022-06-06 MED ORDER — METRONIDAZOLE 500 MG PO TABS: 500.0000 mg | ORAL_TABLET | Freq: Two times a day (BID) | ORAL | 0 refills | Status: DC

## 2022-07-25 NOTE — Progress Notes (Signed)
Patient ID: Janet [name], adult    DOB: [birth year]  MRN: [id number]  CC: Follow-Up  Subjective: Janet [name] is a 56 y.o. adult who presents for follow-up.  Her concerns today include:  - Established with Cardiology for management of  HFrEF, chronic combined CHF, and DOE. Follow-up appointment scheduled soon. - Established with Pulmonology for management of asthma.  - Elects Cologuard for colon cancer screening.  - Right arm mass present for 2 years. Over time decreased in size. However, recently becoming larger. Endorses pain especially with movement. Denies additional symptoms.    Patient Active Problem List   Diagnosis Date Noted   Acute systolic heart failure ([place])    Essential hypertension 02/17/2021   Asthma    Lymphedema    Elevated brain natriuretic peptide (BNP) level    DOE (dyspnea on exertion) 07/28/2020   Morbid obesity ([place]) 07/28/2020   Chest wall pain 06/01/2017     Current Outpatient Medications on File Prior to Visit  Medication Sig Dispense Refill   albuterol (PROVENTIL) (5 MG/ML) 0.5% nebulizer solution Take 0.5 mLs (2.5 mg total) by nebulization every 6 (six) hours as needed for wheezing or shortness of breath. (Patient not taking: Reported on 03/06/2022) 20 mL 1   albuterol (VENTOLIN HFA) 108 (90 Base) MCG/ACT inhaler Inhale 1-2 puffs into the lungs every 6 (six) hours as needed for wheezing or shortness of breath. 1 each 1   ARIPiprazole (ABILIFY) 5 MG tablet Take 5 mg by mouth daily.     aspirin 81 MG chewable tablet Chew 81 mg by mouth daily. aspirin 81 mg chewable tablet     BLACK CURRANT SEED OIL PO Take 5 mLs by mouth in the morning and at bedtime. (Patient not taking: Reported on 03/06/2022)     Budeson-Glycopyrrol-Formoterol (BREZTRI AEROSPHERE) 160-9-4.8 MCG/ACT AERO Inhale 2 puffs into the lungs in the morning and at bedtime. 10.7 g 5   carvedilol (COREG) 3.125 MG tablet Take 6.25 mg by mouth 2 (two) times daily with a meal.      carvedilol (COREG) 6.25 MG tablet Take 1 tablet (6.25 mg total) by mouth 2 (two) times daily. 180 tablet 3   dapagliflozin propanediol (FARXIGA) 10 MG TABS tablet Take 1 tablet (10 mg total) by mouth daily. 30 tablet 5   fluticasone (FLONASE) 50 MCG/ACT nasal spray Place 2 sprays into both nostrils daily. (Patient taking differently: Place 2 sprays into both nostrils daily as needed for allergies.) 16 g 0   furosemide (LASIX) 20 MG tablet Take 1 tablet (20 mg total) by mouth daily. 30 tablet 5   hydrOXYzine (ATARAX) 10 MG tablet Take 1 tablet (10 mg total) by mouth 2 (two) times daily. 180 tablet 3   ibuprofen (ADVIL) 600 MG tablet Take 600 mg by mouth daily as needed for mild pain. (Patient not taking: Reported on 03/06/2022)     ipratropium-albuterol (DUONEB) 0.5-2.5 (3) MG/3ML SOLN Take 3 mLs by nebulization every 6 (six) hours as needed (wheezing, shortness of breath). 360 mL 0   montelukast (SINGULAIR) 10 MG tablet Take 1 tablet (10 mg total) by mouth at bedtime. 90 tablet 0   Multiple Vitamins-Minerals (ADULT ONE DAILY GUMMIES) CHEW Chew 2 tablets by mouth daily with breakfast.     sacubitril-valsartan (ENTRESTO) 24-26 MG Take 2 tablets by mouth 2 (two) times daily.     sacubitril-valsartan (ENTRESTO) 49-51 MG Take 1 tablet by mouth 2 (two) times daily. (Patient not taking: Reported on 03/06/2022) 180 tablet 3  spironolactone (ALDACTONE) 25 MG tablet TAKE 1 TABLET(25 MG) BY MOUTH DAILY 90 tablet 3   traZODone (DESYREL) 150 MG tablet Take 150 mg by mouth at bedtime.     trimethoprim-polymyxin b (POLYTRIM) ophthalmic solution Apply 1-2 drops into affected eye QID x 5 days. 10 mL 0   No current facility-administered medications on file prior to visit.    Allergies  Allergen Reactions   Clarithromycin Other (See Comments)    GI upset   Penicillins Rash and Hives    Social History   Socioeconomic History   Marital status: Single    Spouse name: Not on file   Number of children: 2    Years of education: 14   Highest education level: Associate degree: academic program  Occupational History   Occupation: Ship broker   Occupation: disabled  Tobacco Use   Smoking status: Never    Passive exposure: Yes   Smokeless tobacco: Never  Vaping Use   Vaping Use: Never used  Substance and Sexual Activity   Alcohol use: Yes    Comment: occasional   Drug use: Not Currently    Types: Marijuana    Comment: uses marijuana twice a week   Sexual activity: Yes    Birth control/protection: Surgical  Other Topics Concern   Not on file  Social History Narrative   Not on file   Social Determinants of Health   Financial Resource Strain: Not on file  Food Insecurity: Not on file  Transportation Needs: Not on file  Physical Activity: Not on file  Stress: Not on file  Social Connections: Not on file  Intimate Partner Violence: Not on file    Family History  Problem Relation Age of Onset   Hypertension Mother    Stroke Mother    Deep vein thrombosis Mother    Congestive Heart Failure Mother    Other Father        history unknown   Obesity Sister    Obesity Brother    Congestive Heart Failure Maternal Grandmother     Past Surgical History:  Procedure Laterality Date   ABDOMINAL HYSTERECTOMY     BREAST BIOPSY Right    CESAREAN SECTION     HERNIA REPAIR     umbilical as a child   RIGHT/LEFT HEART CATH AND CORONARY ANGIOGRAPHY N/A 04/01/2021   Procedure: RIGHT/LEFT HEART CATH AND CORONARY ANGIOGRAPHY;  Surgeon: Jettie Booze, MD;  Location: [facility];  Service: Cardiovascular;  Laterality: N/A;   TUBAL LIGATION      ROS: Review of Systems Negative except as stated above  PHYSICAL EXAM: BP 101/72 (BP Location: Left Arm, Patient Position: Sitting, Cuff Size: Large)   Pulse 79   Temp 98.3 F (36.8 C)   Resp 16   Ht 5' 7.99" (1.727 m)   Wt 277 lb (125.6 kg)   SpO2 95%   BMI 42.13 kg/m   Physical Exam HENT:     Head: Normocephalic and atraumatic.   Eyes:     Extraocular Movements: Extraocular movements intact.     Conjunctiva/sclera: Conjunctivae normal.     Pupils: Pupils are equal, round, and reactive to light.  Cardiovascular:     Rate and Rhythm: Normal rate and regular rhythm.     Pulses: Normal pulses.     Heart sounds: Normal heart sounds.  Pulmonary:     Effort: Pulmonary effort is normal.     Breath sounds: Normal breath sounds.  Musculoskeletal:     Cervical back:  Normal range of motion and neck supple.     Comments: Right forearm mass tender to palpation without drainage or erythema.  Neurological:     General: No focal deficit present.     Mental Status: She is alert and oriented to person, place, and time.  Psychiatric:        Mood and Affect: Mood normal.        Behavior: Behavior normal.     ASSESSMENT AND PLAN: 1. Lipoma of right upper extremity - Referral to Dermatology for further evaluation and management. - Ambulatory referral to Dermatology  2. Diabetes mellitus screening - Update screening.  - Hemoglobin A1c  3. Colon cancer screening - Cologuard screening for colon cancer.  - Cologuard   Patient was given the opportunity to ask questions.  Patient verbalized understanding of the plan and was able to repeat key elements of the plan. Patient was given clear instructions to go to Emergency Department or return to medical center if symptoms don't improve, worsen, or new problems develop.The patient verbalized understanding.   Orders Placed This Encounter  Procedures   Hemoglobin A1c   Cologuard   Ambulatory referral to Dermatology   Follow-up with primary provider as scheduled.  Camillia Herter, NP

## 2022-08-02 ENCOUNTER — Encounter: Payer: Self-pay | Admitting: Family

## 2022-08-02 ENCOUNTER — Ambulatory Visit (INDEPENDENT_AMBULATORY_CARE_PROVIDER_SITE_OTHER): Payer: Medicare Other | Admitting: Family

## 2022-08-02 VITALS — BP 101/72 | HR 79 | Temp 98.3°F | Resp 16 | Ht 67.99 in | Wt 277.0 lb

## 2022-08-02 DIAGNOSIS — Z131 Encounter for screening for diabetes mellitus: Secondary | ICD-10-CM

## 2022-08-02 DIAGNOSIS — D1721 Benign lipomatous neoplasm of skin and subcutaneous tissue of right arm: Secondary | ICD-10-CM

## 2022-08-02 DIAGNOSIS — Z1211 Encounter for screening for malignant neoplasm of colon: Secondary | ICD-10-CM | POA: Diagnosis not present

## 2022-08-02 DIAGNOSIS — I10 Essential (primary) hypertension: Secondary | ICD-10-CM

## 2022-08-02 NOTE — Progress Notes (Signed)
Pt presents for mass on right arm  -present for approx 2 years was smaller has grown larger -repetitive motion causes pain to the area

## 2022-08-02 NOTE — Patient Instructions (Signed)
Colorectal Cancer Screening  Colorectal cancer screening is a group of tests that are used to check for colorectal cancer before symptoms develop. Colorectal refers to the colon and rectum. The colon and rectum are located at the end of the digestive tract and carry stool (feces) out of the body. Who should have screening? All adults who are 35-55 years old should have screening. Your health care provider may recommend screening before age 74. You will have tests every 1-10 years, depending on your results and the type of screening test. Screening recommendations for adults who are 85-75 years old vary depending on a person's health. People older than age 10 should no longer get colorectal cancer screening. You may have screening tests starting before age 45, or more often than other people, if you have any of these risk factors: A personal or family history of colorectal cancer or abnormal growths known as polyps in your colon. Inflammatory bowel disease, such as ulcerative colitis or Crohn's disease. A history of having radiation treatment to the abdomen or the area between the hip bones (pelvic area) for cancer. A type of genetic syndrome that is passed from parent to child (hereditary), such as: Lynch syndrome. Familial adenomatous polyposis. Turcot syndrome. Peutz-Jeghers syndrome. MUTYH-associated polyposis (MAP). A personal history of diabetes. Types of tests There are several types of colorectal screening tests. You may have one or more of the following: Guaiac-based fecal occult blood testing. For this test, a stool sample is checked for hidden (occult) blood, which could be a sign of colorectal cancer. Fecal immunochemical test (FIT). For this test, a stool sample is checked for blood, which could be a sign of colorectal cancer. Stool DNA test. For this test, a stool sample is checked for blood and changes in DNA that could lead to colorectal cancer. Sigmoidoscopy. During this test, a  thin, flexible tube with a camera on the end, called a sigmoidoscope, is used to examine the rectum and the lower colon. Colonoscopy. During this test, a long, flexible tube with a camera on the end, called a colonoscope, is used to examine the entire colon and rectum. Also, sometimes a tissue sample is taken to be looked at under a microscope (biopsy) or small polyps are removed during this test. Virtual colonoscopy. Instead of a colonoscope, this type of colonoscopy uses a CT scan to take pictures of the colon and rectum. A CT scan is a type of X-ray that is made using computers. What are the benefits of screening? Screening reduces your risk for colorectal cancer and can help identify cancer at an early stage, when the cancer can be removed or treated more easily. It is common for polyps to form in the lining of the colon, especially as you age. These polyps may be cancerous or become cancerous over time. Screening can identify these polyps. What are the risks of screening? Generally, these are safe tests. However, problems may occur, including: The need for more tests to confirm results from a stool sample test. Stool sample tests have fewer risks than other types of screening tests. Being exposed to low levels of radiation, if you had a test involving X-rays. This may slightly increase your cancer risk. The benefit of detecting cancer outweighs the slight increase in risk. Bleeding, damage to the intestine, or infection caused by a sigmoidoscopy or colonoscopy. A reaction to medicines given during a sigmoidoscopy or colonoscopy. Talk with your health care provider to understand your risk for colorectal cancer and to make a  screening plan that is right for you. Questions to ask your health care provider When should I start colorectal cancer screening? What is my risk for colorectal cancer? How often do I need screening? Which screening tests do I need? How do I get my test results? What do my  results mean? Where to find more information Learn more about colorectal cancer screening from: The American Cancer Society: cancer.[place]: cancer.gov Summary Colorectal cancer screening is a group of tests used to check for colorectal cancer before symptoms develop. All adults who are 80-70 years old should have screening. Your health care provider may recommend screening before age 7. You may have screening tests starting before age 68, or more often than other people, if you have certain risk factors. Screening reduces your risk for colorectal cancer and can help identify cancer at an early stage, when the cancer can be removed or treated more easily. Talk with your health care provider to understand your risk for colorectal cancer and to make a screening plan that is right for you. This information is not intended to replace advice given to you by your health care provider. Make sure you discuss any questions you have with your health care provider. Document Revised: 02/25/2020 Document Reviewed: 02/25/2020 Elsevier Patient Education  [place].

## 2022-08-03 ENCOUNTER — Other Ambulatory Visit (HOSPITAL_COMMUNITY): Payer: Self-pay | Admitting: Cardiovascular Disease

## 2022-08-03 ENCOUNTER — Other Ambulatory Visit: Payer: Self-pay | Admitting: Cardiovascular Disease

## 2022-08-03 ENCOUNTER — Encounter: Payer: Self-pay | Admitting: Family

## 2022-08-03 DIAGNOSIS — I77810 Thoracic aortic ectasia: Secondary | ICD-10-CM

## 2022-08-03 DIAGNOSIS — R7303 Prediabetes: Secondary | ICD-10-CM | POA: Insufficient documentation

## 2022-08-03 LAB — HEMOGLOBIN A1C
Est. average glucose Bld gHb Est-mCnc: 123 mg/dL
Hgb A1c MFr Bld: 5.9 % — ABNORMAL HIGH (ref 4.8–5.6)

## 2022-08-04 ENCOUNTER — Telehealth (HOSPITAL_COMMUNITY): Payer: Self-pay | Admitting: *Deleted

## 2022-08-04 NOTE — Telephone Encounter (Signed)
Attempted to call patient regarding upcoming cardiac MRI appointment. Unable to leave a message.  Gordy Clement RN Navigator Cardiac Imaging [facility] [phone] Office [phone] Cell

## 2022-08-07 ENCOUNTER — Ambulatory Visit (HOSPITAL_COMMUNITY)
Admission: RE | Admit: 2022-08-07 | Discharge: 2022-08-07 | Disposition: A | Discharge status: Home / Self Care | Payer: Medicare Other | Source: Ambulatory Visit | Attending: Cardiovascular Disease | Admitting: Cardiovascular Disease

## 2022-08-07 DIAGNOSIS — I77810 Thoracic aortic ectasia: Secondary | ICD-10-CM | POA: Diagnosis not present

## 2022-08-07 DIAGNOSIS — I351 Nonrheumatic aortic (valve) insufficiency: Secondary | ICD-10-CM | POA: Diagnosis not present

## 2022-08-07 DIAGNOSIS — I5020 Unspecified systolic (congestive) heart failure: Secondary | ICD-10-CM | POA: Insufficient documentation

## 2022-08-07 DIAGNOSIS — I77819 Aortic ectasia, unspecified site: Secondary | ICD-10-CM | POA: Insufficient documentation

## 2022-08-07 MED ORDER — GADOBUTROL 1 MMOL/ML IV SOLN
10.0000 mL | Freq: Once | INTRAVENOUS | Status: AC | PRN
  Administered 2022-08-07: 10 mL via INTRAVENOUS

## 2022-08-09 ENCOUNTER — Telehealth: Payer: Self-pay

## 2022-08-09 DIAGNOSIS — I5020 Unspecified systolic (congestive) heart failure: Secondary | ICD-10-CM

## 2022-08-09 NOTE — Telephone Encounter (Signed)
-----   Message from Thayer Headings, MD sent at 08/08/2022  6:30 PM EDT ----- Please refer to advanced heart failure clinic for further evaluation of her CHF

## 2022-08-09 NOTE — Telephone Encounter (Signed)
Called and spoke with patient who understands the need to see the Advanced heart failure clinic. I informed her that they are very backed up with appointments, so it will likely be 2-3 months before she's scheduled to be seen, but we would like her to have a second opinion. She agrees to plan. Referral placed at this time.

## 2022-08-17 DIAGNOSIS — Z1211 Encounter for screening for malignant neoplasm of colon: Secondary | ICD-10-CM | POA: Diagnosis not present

## 2022-08-23 ENCOUNTER — Other Ambulatory Visit: Payer: Self-pay | Admitting: Family

## 2022-08-23 DIAGNOSIS — R195 Other fecal abnormalities: Secondary | ICD-10-CM

## 2022-08-23 DIAGNOSIS — Z1211 Encounter for screening for malignant neoplasm of colon: Secondary | ICD-10-CM

## 2022-08-23 LAB — COLOGUARD: COLOGUARD: POSITIVE — AB

## 2022-08-28 ENCOUNTER — Telehealth (HOSPITAL_COMMUNITY): Payer: Self-pay | Admitting: Surgery

## 2022-08-28 NOTE — Telephone Encounter (Signed)
Patient called and new patient appt scheduled in [facility] Clinic with Dr. Daniel Nones for Friday October 13th.

## 2022-08-31 ENCOUNTER — Other Ambulatory Visit: Payer: Self-pay | Admitting: Family

## 2022-08-31 DIAGNOSIS — J4540 Moderate persistent asthma, uncomplicated: Secondary | ICD-10-CM

## 2022-08-31 NOTE — Telephone Encounter (Signed)
Order complete. 

## 2022-09-01 ENCOUNTER — Ambulatory Visit (HOSPITAL_COMMUNITY)
Admission: RE | Admit: 2022-09-01 | Discharge: 2022-09-01 | Disposition: A | Discharge status: Home / Self Care | Payer: Medicare Other | Source: Ambulatory Visit | Attending: Cardiology | Admitting: Cardiology

## 2022-09-01 VITALS — BP 130/90 | HR 75 | Wt 279.8 lb

## 2022-09-01 DIAGNOSIS — Z87440 Personal history of urinary (tract) infections: Secondary | ICD-10-CM | POA: Insufficient documentation

## 2022-09-01 DIAGNOSIS — E669 Obesity, unspecified: Secondary | ICD-10-CM | POA: Insufficient documentation

## 2022-09-01 DIAGNOSIS — J45909 Unspecified asthma, uncomplicated: Secondary | ICD-10-CM | POA: Diagnosis not present

## 2022-09-01 DIAGNOSIS — I5020 Unspecified systolic (congestive) heart failure: Secondary | ICD-10-CM

## 2022-09-01 DIAGNOSIS — I5022 Chronic systolic (congestive) heart failure: Secondary | ICD-10-CM | POA: Insufficient documentation

## 2022-09-01 DIAGNOSIS — I10 Essential (primary) hypertension: Secondary | ICD-10-CM | POA: Diagnosis not present

## 2022-09-01 DIAGNOSIS — I428 Other cardiomyopathies: Secondary | ICD-10-CM | POA: Diagnosis not present

## 2022-09-01 DIAGNOSIS — Z79899 Other long term (current) drug therapy: Secondary | ICD-10-CM | POA: Diagnosis not present

## 2022-09-01 DIAGNOSIS — I5021 Acute systolic (congestive) heart failure: Secondary | ICD-10-CM | POA: Diagnosis not present

## 2022-09-01 DIAGNOSIS — I110 Hypertensive heart disease with heart failure: Secondary | ICD-10-CM | POA: Diagnosis not present

## 2022-09-01 LAB — BASIC METABOLIC PANEL
Anion gap: 8 (ref 5–15)
BUN: 11 mg/dL (ref 6–20)
CO2: 24 mmol/L (ref 22–32)
Calcium: 9.2 mg/dL (ref 8.9–10.3)
Chloride: 108 mmol/L (ref 98–111)
Creatinine, Ser: 1.08 mg/dL — ABNORMAL HIGH (ref 0.44–1.00)
GFR, Estimated: >60 mL/min (ref >60)
Glucose, Bld: 109 mg/dL — ABNORMAL HIGH (ref 70–99)
Potassium: 4.3 mmol/L (ref 3.5–5.1)
Sodium: 140 mmol/L (ref 135–145)

## 2022-09-01 LAB — BRAIN NATRIURETIC PEPTIDE: B Natriuretic Peptide: 78.3 pg/mL (ref 0.0–100.0)

## 2022-09-01 MED ORDER — ENTRESTO 49-51 MG PO TABS: 1.0000 | ORAL_TABLET | Freq: Two times a day (BID) | ORAL | 3 refills | Status: DC

## 2022-09-01 MED ORDER — CARVEDILOL 6.25 MG PO TABS: 6.2500 mg | ORAL_TABLET | Freq: Two times a day (BID) | ORAL | 3 refills | Status: DC

## 2022-09-01 MED ORDER — FUROSEMIDE 20 MG PO TABS: 20.0000 mg | ORAL_TABLET | Freq: Every day | ORAL | 5 refills | Status: DC

## 2022-09-01 NOTE — Patient Instructions (Signed)
Medication Changes:  Increase Furosemide to 40 mg (2 tabs) daily FOR 5 DAYS ONLY, then resume 20 mg Daily  Take Carvedilol 6.25 mg Twice daily   Take Entresto 49/51 mg Twice daily   Lab Work:  Labs done today, your results will be available in MyChart, we will contact you for abnormal readings.  Testing/Procedures:  none  Referrals:  You have been referred to Cardiac Rehab, they will call you to schedule  Special Instructions // Education:  Do the following things EVERYDAY: Weigh yourself in the morning before breakfast. Write it down and keep it in a log. Take your medicines as prescribed Eat low salt foods--Limit salt (sodium) to 2000 mg per day.  Stay as active as you can everyday Limit all fluids for the day to less than 2 liters  Follow-Up:   Please follow up with our heart failure pharmacist in 2-3 weeks  Your physician recommends that you schedule a follow-up appointment in: 2 months    At the [facility], you and your health needs are our priority. We have a designated team specialized in the treatment of Heart Failure. This Care Team includes your primary Heart Failure Specialized Cardiologist (physician), Advanced Practice Providers (APPs- Physician Assistants and Nurse Practitioners), and Pharmacist who all work together to provide you with the care you need, when you need it.   You may see any of the following providers on your designated Care Team at your next follow up:  Dr. Glori Bickers Dr. Loralie Champagne Dr. Roxana Hires, NP Lyda Jester, [place] [facility] [place], [place] Forestine Na, NP Audry Riles, PharmD   Please be sure to bring in all your medications bottles to every appointment.   Need to Contact us:  If you have any questions or concerns before your next appointment please send [place] a message through [place] or call our office at [phone].    TO LEAVE A MESSAGE FOR THE NURSE SELECT OPTION  2, PLEASE LEAVE A MESSAGE INCLUDING: YOUR NAME DATE OF BIRTH CALL BACK NUMBER REASON FOR CALL**this is important as we prioritize the call backs  YOU WILL RECEIVE A CALL BACK THE SAME DAY AS LONG AS YOU CALL BEFORE 4:00 PM '

## 2022-09-04 NOTE — Progress Notes (Signed)
ADVANCED HEART FAILURE CLINIC NOTE  Referring Physician: Camillia Herter, NP  Primary Care: Camillia Herter, NP Primary Cardiologist: Mertie Moores, MD  HPI: Janet [name] is a very pleasant 56 y.o. adult with T2DM, asthma, HTN, obesity, hx of substance abuse and HFrEF presenting today to establish care. Janet [name] reports being initially diagnosed with CHF in early 2022 after presenting to the hospital with shortness of breath and chest pressure.  She believes her CHF was brought on by use of crack/cocaine.  Since diagnosis to her LVEF has ranged from 25% to 40%.  While she has worked very hard to control her substance use, she believes that her drops and LV function have been due to unfortunate relapses.  Today she reports being free of any type of drug use for roughly 8 to 9 months.  Her last relapse was after the death of a close family member.  From a functional standpoint she is doing very well.  NYHA IIb functional class with moderate dyspnea only after walking uphill or greater than 50 feet.  No reported chest pain, palpitations, syncope, lightheadedness.  She reports compliance with medications.   Activity level/exercise tolerance: NYHA IIb Orthopnea:  Sleeps on 2 pillows Paroxysmal noctural dyspnea: No Chest pain/pressure: No Orthostatic lightheadedness: No Palpitations: No Lower extremity edema: Intermittent Presyncope/syncope: No Cough: No  Past Medical History:  Diagnosis Date   Asthma    CHF (congestive heart failure) (HCC)    Congestive heart failure (CHF) (HCC)    Depression    Elevated brain natriuretic peptide (BNP) level    Hypertension    Lymphedema    SOB (shortness of breath)     Current Outpatient Medications  Medication Sig Dispense Refill   albuterol (PROVENTIL) (5 MG/ML) 0.5% nebulizer solution Take 0.5 mLs (2.5 mg total) by nebulization every 6 (six) hours as needed for wheezing or shortness of breath. 20 mL 1   albuterol (VENTOLIN HFA) 108  (90 Base) MCG/ACT inhaler Inhale 1-2 puffs into the lungs every 6 (six) hours as needed for wheezing or shortness of breath. 1 each 1   ARIPiprazole (ABILIFY) 5 MG tablet Take 5 mg by mouth daily.     aspirin 81 MG chewable tablet Chew 81 mg by mouth daily. aspirin 81 mg chewable tablet     Budeson-Glycopyrrol-Formoterol (BREZTRI AEROSPHERE) 160-9-4.8 MCG/ACT AERO Inhale 2 puffs into the lungs in the morning and at bedtime. 10.7 g 5   dapagliflozin propanediol (FARXIGA) 10 MG TABS tablet Take 1 tablet (10 mg total) by mouth daily. 30 tablet 5   fluticasone (FLONASE) 50 MCG/ACT nasal spray Place 2 sprays into both nostrils daily. (Patient taking differently: Place 2 sprays into both nostrils daily as needed for allergies.) 16 g 0   hydrOXYzine (ATARAX) 10 MG tablet Take 1 tablet (10 mg total) by mouth 2 (two) times daily. 180 tablet 3   ibuprofen (ADVIL) 600 MG tablet Take 600 mg by mouth daily as needed for mild pain.     montelukast (SINGULAIR) 10 MG tablet TAKE 1 TABLET(10 MG) BY MOUTH AT BEDTIME 30 tablet 2   Multiple Vitamins-Minerals (ADULT ONE DAILY GUMMIES) CHEW Chew 2 tablets by mouth daily with breakfast.     spironolactone (ALDACTONE) 25 MG tablet TAKE 1 TABLET(25 MG) BY MOUTH DAILY 90 tablet 3   traZODone (DESYREL) 150 MG tablet Take 150 mg by mouth at bedtime.     BLACK CURRANT SEED OIL PO Take 5 mLs by mouth in the morning and  at bedtime. (Patient not taking: Reported on 03/06/2022)     carvedilol (COREG) 6.25 MG tablet Take 1 tablet (6.25 mg total) by mouth 2 (two) times daily. 60 tablet 3   furosemide (LASIX) 20 MG tablet Take 1 tablet (20 mg total) by mouth daily. 30 tablet 5   ipratropium-albuterol (DUONEB) 0.5-2.5 (3) MG/3ML SOLN Take 3 mLs by nebulization every 6 (six) hours as needed (wheezing, shortness of breath). (Patient not taking: Reported on 09/01/2022) 360 mL 0   sacubitril-valsartan (ENTRESTO) 49-51 MG Take 1 tablet by mouth 2 (two) times daily. 60 tablet 3    trimethoprim-polymyxin b (POLYTRIM) ophthalmic solution Apply 1-2 drops into affected eye QID x 5 days. 10 mL 0   No current facility-administered medications for this encounter.    Allergies  Allergen Reactions   Clarithromycin Other (See Comments)    GI upset   Penicillins Rash and Hives      Social History   Socioeconomic History   Marital status: Single    Spouse name: Not on file   Number of children: 2   Years of education: 14   Highest education level: Associate degree: academic program  Occupational History   Occupation: Ship broker   Occupation: disabled  Tobacco Use   Smoking status: Never    Passive exposure: Yes   Smokeless tobacco: Never  Vaping Use   Vaping Use: Never used  Substance and Sexual Activity   Alcohol use: Yes    Comment: occasional   Drug use: Not Currently    Types: Marijuana    Comment: uses marijuana twice a week   Sexual activity: Yes    Birth control/protection: Surgical  Other Topics Concern   Not on file  Social History Narrative   Not on file   Social Determinants of Health   Financial Resource Strain: Not on file  Food Insecurity: Not on file  Transportation Needs: Not on file  Physical Activity: Not on file  Stress: Not on file  Social Connections: Not on file  Intimate Partner Violence: Not on file      Family History  Problem Relation Age of Onset   Hypertension Mother    Stroke Mother    Deep vein thrombosis Mother    Congestive Heart Failure Mother    Other Father        history unknown   Obesity Sister    Obesity Brother    Congestive Heart Failure Maternal Grandmother     PHYSICAL EXAM: Vitals:   09/01/22 1217  BP: (!) 130/90  Pulse: 75  SpO2: 97%   GENERAL: Well nourished, well developed, and in no apparent distress at rest.  HEENT: Negative for arcus senilis or xanthelasma. There is no scleral icterus.  The mucous membranes are pink and moist.   NECK: Supple, No masses. Normal carotid upstrokes  without bruits. No masses or thyromegaly.    CHEST: There are no chest wall deformities. There is no chest wall tenderness. Respirations are unlabored.  Lungs-coarse, CTA bilaterally CARDIAC:  JVP: Elevated to 10 cm H2O         Normal S1, S2  Normal rate with regular rhythm. No murmurs, rubs or gallops.  Pulses are 2+ and symmetrical in upper and lower extremities.  1+ edema.  ABDOMEN: Soft, non-tender, non-distended. There are no masses or hepatomegaly. There are normal bowel sounds.  EXTREMITIES: Warm and well perfused with no cyanosis, clubbing.  LYMPHATIC: No axillary or supraclavicular lymphadenopathy.  NEUROLOGIC: Patient is oriented x3 with no  focal or lateralizing neurologic deficits.  PSYCH: Patients affect is appropriate, there is no evidence of anxiety or depression.  SKIN: Warm and dry; no lesions or wounds.   DATA REVIEW  ECG: Sinus rhythm with LVH  ECHO: 05/16/22: LVEF 40%, RV function normal.  09/22/21: LVEF 30-35%, RV normal. LA moderately dilated.   CATH: 3/61/22:  LV end diastolic pressure is moderately elevated. There is no aortic valve stenosis. Hemodynamic findings consistent with mild pulmonary hypertension. Ao sat 99%, PA sat 73%, mean PA pressure 33 mmHg, mean PCWP 22 mmHg, cardiac output 6.2 L/min, cardiac index 2.6. No angiographically apparent coronary artery disease. Significant tortuosity noted in the RCA and the circumflex.  CMR (08/07/22): 1. Moderate LVE/LVH with abnormal septal motion and global hypokinesis LVEF 23%  2. Mild non specific mid myocardial gadolinium uptake in anterior wall  3.  Low normal RVEF 45%  4.  Tri leaflet AV with mild AR Regurgitant fraction 19%  5.  Mild Ascending thoracic aorta/sinus dilatation 3.8 cm  ASSESSMENT & PLAN:  Stage C, HFrEF, Nonischemic CMP Etiology of ES:[id number], likely related to subustance abuse NYHA class / AHA Stage:IIB - III Volume status & Diuretics: Mildly hypervolemic, increase lasix to '40mg'$  for  5 days then resume '20mg'$  daily Vasodilators:Entresto 49/'51mg'$  BID Beta-Blocker:Increase coreg to 6.'25mg'$  BID MRA: Spironolactone '25mg'$  daily Cardiometabolic:Holding farxiga due to recent UTI, will plan on restarting.  Devices therapies & Valvulopathies:Repeat TTE while on GDMT; I suspect LVEF from CMR will improve.  Advanced therapies:Not indicated  2. HTN - See above, Entresto 49/51  3. Obesity  - Discussed lifestyle modifications   4. Substance abuse - Several months without any crack/cocaine; her last relapse was unfortunately due to death of a close family member. She is working hard to avoid any drugs at this time.    Consuelo Thayne Advanced Heart Failure Mechanical Circulatory Support

## 2022-09-19 ENCOUNTER — Inpatient Hospital Stay (HOSPITAL_COMMUNITY): Admission: RE | Admit: 2022-09-19 | Payer: Medicare Other | Source: Ambulatory Visit

## 2022-09-25 ENCOUNTER — Other Ambulatory Visit (HOSPITAL_COMMUNITY): Payer: Self-pay

## 2022-09-27 ENCOUNTER — Inpatient Hospital Stay (HOSPITAL_COMMUNITY): Admission: RE | Admit: 2022-09-27 | Payer: Medicare Other | Source: Ambulatory Visit

## 2022-10-11 ENCOUNTER — Encounter (HOSPITAL_COMMUNITY): Payer: Self-pay

## 2022-10-27 ENCOUNTER — Ambulatory Visit (HOSPITAL_COMMUNITY)
Admission: RE | Admit: 2022-10-27 | Discharge: 2022-10-27 | Disposition: A | Discharge status: Home / Self Care | Payer: Medicare Other | Source: Ambulatory Visit | Attending: Cardiology | Admitting: Cardiology

## 2022-10-27 ENCOUNTER — Encounter (HOSPITAL_COMMUNITY): Payer: Self-pay | Admitting: Cardiology

## 2022-10-27 VITALS — BP 110/70 | HR 68 | Wt 277.0 lb

## 2022-10-27 DIAGNOSIS — I110 Hypertensive heart disease with heart failure: Secondary | ICD-10-CM | POA: Insufficient documentation

## 2022-10-27 DIAGNOSIS — I5020 Unspecified systolic (congestive) heart failure: Secondary | ICD-10-CM

## 2022-10-27 DIAGNOSIS — E669 Obesity, unspecified: Secondary | ICD-10-CM | POA: Insufficient documentation

## 2022-10-27 DIAGNOSIS — I5022 Chronic systolic (congestive) heart failure: Secondary | ICD-10-CM | POA: Insufficient documentation

## 2022-10-27 DIAGNOSIS — Z79899 Other long term (current) drug therapy: Secondary | ICD-10-CM | POA: Insufficient documentation

## 2022-10-27 DIAGNOSIS — J45909 Unspecified asthma, uncomplicated: Secondary | ICD-10-CM | POA: Diagnosis not present

## 2022-10-27 DIAGNOSIS — I428 Other cardiomyopathies: Secondary | ICD-10-CM | POA: Insufficient documentation

## 2022-10-27 DIAGNOSIS — E119 Type 2 diabetes mellitus without complications: Secondary | ICD-10-CM | POA: Diagnosis not present

## 2022-10-27 DIAGNOSIS — R0789 Other chest pain: Secondary | ICD-10-CM | POA: Diagnosis not present

## 2022-10-27 DIAGNOSIS — F1910 Other psychoactive substance abuse, uncomplicated: Secondary | ICD-10-CM | POA: Insufficient documentation

## 2022-10-27 LAB — BASIC METABOLIC PANEL
Anion gap: 7 (ref 5–15)
BUN: 17 mg/dL (ref 6–20)
CO2: 23 mmol/L (ref 22–32)
Calcium: 9.1 mg/dL (ref 8.9–10.3)
Chloride: 110 mmol/L (ref 98–111)
Creatinine, Ser: 1.04 mg/dL — ABNORMAL HIGH (ref 0.44–1.00)
GFR, Estimated: >60 mL/min (ref >60)
Glucose, Bld: 105 mg/dL — ABNORMAL HIGH (ref 70–99)
Potassium: 4 mmol/L (ref 3.5–5.1)
Sodium: 140 mmol/L (ref 135–145)

## 2022-10-27 LAB — BRAIN NATRIURETIC PEPTIDE: B Natriuretic Peptide: 109 pg/mL — ABNORMAL HIGH (ref 0.0–100.0)

## 2022-10-27 MED ORDER — CARVEDILOL 12.5 MG PO TABS: 12.5000 mg | ORAL_TABLET | Freq: Two times a day (BID) | ORAL | 3 refills | Status: DC

## 2022-10-27 NOTE — Progress Notes (Signed)
ADVANCED HEART FAILURE CLINIC NOTE  Referring Physician: Camillia Herter, NP  Primary Care: Camillia Herter, NP Primary Cardiologist: Mertie Moores, MD  HPI: Janet [name] is a very pleasant 56 y.o. adult with T2DM, asthma, HTN, obesity, hx of substance abuse and HFrEF presenting today to establish care. Janet [name] reports being initially diagnosed with CHF in early 2022 after presenting to the hospital with shortness of breath and chest pressure.  She believes her CHF was brought on by use of crack/cocaine.  Since diagnosis to her LVEF has ranged from 25% to 40%.  While she has worked very hard to control her substance use, she believes that her drops and LV function have been due to unfortunate relapses.  Today she reports being free of any type of drug use for roughly 8 to 9 months.  Her last relapse was after the death of a close family member.  Interval hx: Janet [name] has done remarkably well over the last several weeks.  She has remained compliant with medications and seen a sustained improvement in exercise capacity.  She states that she is now able to take care of her grandkids and has only minimal dyspnea on exertion.  No PND, no chest pain, no lightheadedness.  Activity level/exercise tolerance: NYHA IIb Orthopnea:  Sleeps on 2 pillows Paroxysmal noctural dyspnea: No Chest pain/pressure: No Orthostatic lightheadedness: No Palpitations: No Lower extremity edema: Intermittent Presyncope/syncope: No Cough: No  Past Medical History:  Diagnosis Date   Asthma    CHF (congestive heart failure) (HCC)    Congestive heart failure (CHF) (HCC)    Depression    Elevated brain natriuretic peptide (BNP) level    Hypertension    Lymphedema    SOB (shortness of breath)     Current Outpatient Medications  Medication Sig Dispense Refill   albuterol (PROVENTIL) (5 MG/ML) 0.5% nebulizer solution Take 0.5 mLs (2.5 mg total) by nebulization every 6 (six) hours as needed for  wheezing or shortness of breath. 20 mL 1   albuterol (VENTOLIN HFA) 108 (90 Base) MCG/ACT inhaler Inhale 1-2 puffs into the lungs every 6 (six) hours as needed for wheezing or shortness of breath. 1 each 1   ARIPiprazole (ABILIFY) 5 MG tablet Take 5 mg by mouth daily.     aspirin 81 MG chewable tablet Chew 81 mg by mouth daily. aspirin 81 mg chewable tablet     Budeson-Glycopyrrol-Formoterol (BREZTRI AEROSPHERE) 160-9-4.8 MCG/ACT AERO Inhale 2 puffs into the lungs in the morning and at bedtime. 10.7 g 5   carvedilol (COREG) 6.25 MG tablet Take 1 tablet (6.25 mg total) by mouth 2 (two) times daily. 60 tablet 3   dapagliflozin propanediol (FARXIGA) 10 MG TABS tablet Take 1 tablet (10 mg total) by mouth daily. 30 tablet 5   fluticasone (FLONASE) 50 MCG/ACT nasal spray Place 2 sprays into both nostrils daily. (Patient taking differently: Place 2 sprays into both nostrils daily as needed for allergies.) 16 g 0   furosemide (LASIX) 20 MG tablet Take 1 tablet (20 mg total) by mouth daily. 30 tablet 5   hydrOXYzine (ATARAX) 10 MG tablet Take 1 tablet (10 mg total) by mouth 2 (two) times daily. 180 tablet 3   ibuprofen (ADVIL) 600 MG tablet Take 600 mg by mouth daily as needed for mild pain.     montelukast (SINGULAIR) 10 MG tablet TAKE 1 TABLET(10 MG) BY MOUTH AT BEDTIME 30 tablet 2   Multiple Vitamins-Minerals (ADULT ONE DAILY GUMMIES) CHEW Chew 2  tablets by mouth daily with breakfast.     sacubitril-valsartan (ENTRESTO) 49-51 MG Take 1 tablet by mouth 2 (two) times daily. 60 tablet 3   spironolactone (ALDACTONE) 25 MG tablet TAKE 1 TABLET(25 MG) BY MOUTH DAILY 90 tablet 3   traZODone (DESYREL) 150 MG tablet Take 150 mg by mouth at bedtime.     BLACK CURRANT SEED OIL PO Take 5 mLs by mouth in the morning and at bedtime. (Patient not taking: Reported on 03/06/2022)     No current facility-administered medications for this encounter.    Allergies  Allergen Reactions   Clarithromycin Other (See Comments)     GI upset   Penicillins Rash, Hives and Other (See Comments)      Social History   Socioeconomic History   Marital status: Single    Spouse name: Not on file   Number of children: 2   Years of education: 14   Highest education level: Associate degree: academic program  Occupational History   Occupation: Ship broker   Occupation: disabled  Tobacco Use   Smoking status: Never    Passive exposure: Yes   Smokeless tobacco: Never  Vaping Use   Vaping Use: Never used  Substance and Sexual Activity   Alcohol use: Yes    Comment: occasional   Drug use: Not Currently    Types: Marijuana    Comment: uses marijuana twice a week   Sexual activity: Yes    Birth control/protection: Surgical  Other Topics Concern   Not on file  Social History Narrative   Not on file   Social Determinants of Health   Financial Resource Strain: Not on file  Food Insecurity: Not on file  Transportation Needs: Not on file  Physical Activity: Not on file  Stress: Not on file  Social Connections: Not on file  Intimate Partner Violence: Not on file      Family History  Problem Relation Age of Onset   Hypertension Mother    Stroke Mother    Deep vein thrombosis Mother    Congestive Heart Failure Mother    Other Father        history unknown   Obesity Sister    Obesity Brother    Congestive Heart Failure Maternal Grandmother     PHYSICAL EXAM: Vitals:   10/27/22 1034  BP: 110/70  Pulse: 68  SpO2: 97%   GENERAL: Well nourished, well developed, and in no apparent distress at rest.  HEENT: Negative for arcus senilis or xanthelasma. There is no scleral icterus.  The mucous membranes are pink and moist.   NECK: Supple, No masses. Normal carotid upstrokes without bruits. No masses or thyromegaly.    CHEST: There are no chest wall deformities. There is no chest wall tenderness. Respirations are unlabored.  Lungs-CTA bilaterally CARDIAC:  JVP: 8 cm         Normal S1, S2  Normal rate with  regular rhythm. No murmurs, rubs or gallops.  Pulses are 2+ and symmetrical in upper and lower extremities.  1+ edema.  ABDOMEN: Soft, non-tender, non-distended. There are no masses or hepatomegaly. There are normal bowel sounds.  EXTREMITIES: Warm, mild 1+ edema LYMPHATIC: No axillary or supraclavicular lymphadenopathy.  NEUROLOGIC: Patient is oriented x3 with no focal or lateralizing neurologic deficits.  PSYCH: Patients affect is appropriate, there is no evidence of anxiety or depression.  SKIN: Warm and dry; no lesions or wounds.   DATA REVIEW  ECG: Sinus rhythm with LVH  ECHO: 05/16/22: LVEF 40%,  RV function normal.  09/22/21: LVEF 30-35%, RV normal. LA moderately dilated.   CATH: 07/15/40:  LV end diastolic pressure is moderately elevated. There is no aortic valve stenosis. Hemodynamic findings consistent with mild pulmonary hypertension. Ao sat 99%, PA sat 73%, mean PA pressure 33 mmHg, mean PCWP 22 mmHg, cardiac output 6.2 L/min, cardiac index 2.6. No angiographically apparent coronary artery disease. Significant tortuosity noted in the RCA and the circumflex.  CMR (08/07/22): 1. Moderate LVE/LVH with abnormal septal motion and global hypokinesis LVEF 23%  2. Mild non specific mid myocardial gadolinium uptake in anterior wall  3.  Low normal RVEF 45%  4.  Tri leaflet AV with mild AR Regurgitant fraction 19%  5.  Mild Ascending thoracic aorta/sinus dilatation 3.8 cm  ASSESSMENT & PLAN:  Stage C, HFrEF, Nonischemic CMP Etiology of RA:[id number], likely related to subustance abuse NYHA class / AHA Stage:IIB  Volume status & Diuretics: Mildly hypervolemic, increase lasix to '40mg'$  for 5 days then resume '20mg'$  daily Vasodilators:Entresto 49/'51mg'$  BID Beta-Blocker:increase Coreg to 12.5 mg twice daily MRA: Spironolactone '25mg'$  daily Cardiometabolic: Continue Farxiga 10 mg daily Devices therapies & Valvulopathies: Improvement in functional status after starting GDMT.  Will repeat  echocardiogram at follow-up. Advanced therapies:Not indicated  2. HTN - See above, Entresto 49/51  3. Obesity  - Discussed lifestyle modifications   4. Substance abuse -Doing very well now.  Compliant with medications.  Compliant with appointments.  No recent history of substance abuse.  Remains very motivated to continue to improve her health.   Janet [name] Advanced Heart Failure Mechanical Circulatory Support

## 2022-10-27 NOTE — Patient Instructions (Signed)
INCREASE Carvedilol to 12.'5mg'$  Twice daily  INCREASE Lasix to 40 mg daily till Sunday, then return to normal dose.  Labs done today, your results will be available in MyChart, we will contact you for abnormal readings.  Your physician has requested that you have an echocardiogram. Echocardiography is a painless test that uses sound waves to create images of your heart. It provides your doctor with information about the size and shape of your heart and how well your heart's chambers and valves are working. This procedure takes approximately one hour. There are no restrictions for this procedure. Please do NOT wear cologne, perfume, aftershave, or lotions (deodorant is allowed). Please arrive 15 minutes prior to your appointment time.  Your physician recommends that you schedule a follow-up appointment in: 2 months  If you have any questions or concerns before your next appointment please send [place] a message through [place] or call our office at [phone].    TO LEAVE A MESSAGE FOR THE NURSE SELECT OPTION 2, PLEASE LEAVE A MESSAGE INCLUDING: YOUR NAME DATE OF BIRTH CALL BACK NUMBER REASON FOR CALL**this is important as we prioritize the call backs  YOU WILL RECEIVE A CALL BACK THE SAME DAY AS LONG AS YOU CALL BEFORE 4:00 PM  At the [facility], you and your health needs are our priority. As part of our continuing mission to provide you with exceptional heart care, we have created designated Provider Care Teams. These Care Teams include your primary Cardiologist (physician) and Advanced Practice Providers (APPs- Physician Assistants and Nurse Practitioners) who all work together to provide you with the care you need, when you need it.   You may see any of the following providers on your designated Care Team at your next follow up: Dr Glori Bickers Dr Loralie Champagne Dr. Roxana Hires, NP Lyda Jester, [place] [facility] [place], [place] Forestine Na,  NP Audry Riles, PharmD   Please be sure to bring in all your medications bottles to every appointment.

## 2022-11-03 ENCOUNTER — Telehealth: Payer: Medicare Other | Admitting: Family Medicine

## 2022-11-03 ENCOUNTER — Ambulatory Visit: Payer: Self-pay

## 2022-11-03 DIAGNOSIS — J40 Bronchitis, not specified as acute or chronic: Secondary | ICD-10-CM

## 2022-11-03 MED ORDER — AZITHROMYCIN 250 MG PO TABS: ORAL_TABLET | ORAL | 0 refills | Status: AC

## 2022-11-03 MED ORDER — PREDNISONE 20 MG PO TABS: 20.0000 mg | ORAL_TABLET | Freq: Two times a day (BID) | ORAL | 0 refills | Status: AC

## 2022-11-03 MED ORDER — BENZONATATE 200 MG PO CAPS: 200.0000 mg | ORAL_CAPSULE | Freq: Two times a day (BID) | ORAL | 0 refills | Status: AC | PRN

## 2022-11-03 NOTE — Progress Notes (Signed)
Virtual Visit Consent   Janet [name], you are scheduled for a virtual visit with a [place] provider today. Just as with appointments in the office, your consent must be obtained to participate. Your consent will be active for this visit and any virtual visit you may have with one of our providers in the next 365 days. If you have a MyChart account, a copy of this consent can be sent to you electronically.  As this is a virtual visit, video technology does not allow for your provider to perform a traditional examination. This may limit your provider's ability to fully assess your condition. If your provider identifies any concerns that need to be evaluated in person or the need to arrange testing (such as labs, EKG, etc.), we will make arrangements to do so. Although advances in technology are sophisticated, we cannot ensure that it will always work on either your end or our end. If the connection with a video visit is poor, the visit may have to be switched to a telephone visit. With either a video or telephone visit, we are not always able to ensure that we have a secure connection.  By engaging in this virtual visit, you consent to the provision of healthcare and authorize for your insurance to be billed (if applicable) for the services provided during this visit. Depending on your insurance coverage, you may receive a charge related to this service.  I need to obtain your verbal consent now. Are you willing to proceed with your visit today? Janet [name] has provided verbal consent on 11/03/2022 for a virtual visit (video or telephone). Dellia Nims, FNP  Date: 11/03/2022 11:18 AM  Virtual Visit via Video Note   I, Dellia Nims, connected with  Janet [name]  ([id number], 08-05-1966) on 11/03/22 at 11:15 AM EST by a video-enabled telemedicine application and verified that I am speaking with the correct person using two identifiers.  Location: Patient: Virtual Visit Location  Patient: Home Provider: Virtual Visit Location Provider: Home Office   I discussed the limitations of evaluation and management by telemedicine and the availability of in person appointments. The patient expressed understanding and agreed to proceed.    History of Present Illness: Janet [name] is a 56 y.o. who identifies as a female who was assigned adult at birth, and is being seen today for cough, head congestion for 12 days worsening with wheezing fever and chills. Daughter and grandchild are sick also. Has not tested for covid. In no distress. She says she can not take biaxin but zpack works well.   HPI: HPI  Problems:  Patient Active Problem List   Diagnosis Date Noted   Prediabetes 50/38/8828   Acute systolic heart failure (HCC)    Essential hypertension 02/17/2021   Asthma    Lymphedema    Elevated brain natriuretic peptide (BNP) level    DOE (dyspnea on exertion) 07/28/2020   Morbid obesity ([place]) 07/28/2020   Chest wall pain 06/01/2017    Allergies:  Allergies  Allergen Reactions   Clarithromycin Other (See Comments)    GI upset   Penicillins Rash, Hives and Other (See Comments)   Medications:  Current Outpatient Medications:    albuterol (PROVENTIL) (5 MG/ML) 0.5% nebulizer solution, Take 0.5 mLs (2.5 mg total) by nebulization every 6 (six) hours as needed for wheezing or shortness of breath., Disp: 20 mL, Rfl: 1   albuterol (VENTOLIN HFA) 108 (90 Base) MCG/ACT inhaler, Inhale 1-2 puffs into the lungs every 6 (  six) hours as needed for wheezing or shortness of breath., Disp: 1 each, Rfl: 1   ARIPiprazole (ABILIFY) 5 MG tablet, Take 5 mg by mouth daily., Disp: , Rfl:    aspirin 81 MG chewable tablet, Chew 81 mg by mouth daily. aspirin 81 mg chewable tablet, Disp: , Rfl:    BLACK CURRANT SEED OIL PO, Take 5 mLs by mouth in the morning and at bedtime. (Patient not taking: Reported on 03/06/2022), Disp: , Rfl:    Budeson-Glycopyrrol-Formoterol (BREZTRI AEROSPHERE)  160-9-4.8 MCG/ACT AERO, Inhale 2 puffs into the lungs in the morning and at bedtime., Disp: 10.7 g, Rfl: 5   carvedilol (COREG) 12.5 MG tablet, Take 1 tablet (12.5 mg total) by mouth 2 (two) times daily., Disp: 180 tablet, Rfl: 3   dapagliflozin propanediol (FARXIGA) 10 MG TABS tablet, Take 1 tablet (10 mg total) by mouth daily., Disp: 30 tablet, Rfl: 5   fluticasone (FLONASE) 50 MCG/ACT nasal spray, Place 2 sprays into both nostrils daily. (Patient taking differently: Place 2 sprays into both nostrils daily as needed for allergies.), Disp: 16 g, Rfl: 0   furosemide (LASIX) 20 MG tablet, Take 1 tablet (20 mg total) by mouth daily., Disp: 30 tablet, Rfl: 5   hydrOXYzine (ATARAX) 10 MG tablet, Take 1 tablet (10 mg total) by mouth 2 (two) times daily., Disp: 180 tablet, Rfl: 3   ibuprofen (ADVIL) 600 MG tablet, Take 600 mg by mouth daily as needed for mild pain., Disp: , Rfl:    montelukast (SINGULAIR) 10 MG tablet, TAKE 1 TABLET(10 MG) BY MOUTH AT BEDTIME, Disp: 30 tablet, Rfl: 2   Multiple Vitamins-Minerals (ADULT ONE DAILY GUMMIES) CHEW, Chew 2 tablets by mouth daily with breakfast., Disp: , Rfl:    sacubitril-valsartan (ENTRESTO) 49-51 MG, Take 1 tablet by mouth 2 (two) times daily., Disp: 60 tablet, Rfl: 3   spironolactone (ALDACTONE) 25 MG tablet, TAKE 1 TABLET(25 MG) BY MOUTH DAILY, Disp: 90 tablet, Rfl: 3   traZODone (DESYREL) 150 MG tablet, Take 150 mg by mouth at bedtime., Disp: , Rfl:   Observations/Objective: Patient is well-developed, well-nourished in no acute distress.  Resting comfortably  at home.  Head is normocephalic, atraumatic.  No labored breathing.  Speech is clear and coherent with logical content.  Patient is alert and oriented at baseline.    Assessment and Plan: 1. Bronchitis  Increase fluids, humidifier at night, tylenol as directed, urgent care as needed.   Follow Up Instructions: I discussed the assessment and treatment plan with the patient. The patient was  provided an opportunity to ask questions and all were answered. The patient agreed with the plan and demonstrated an understanding of the instructions.  A copy of instructions were sent to the patient via MyChart unless otherwise noted below.     The patient was advised to call back or seek an in-person evaluation if the symptoms worsen or if the condition fails to improve as anticipated.  Time:  I spent 10 minutes with the patient via telehealth technology discussing the above problems/concerns.    Dellia Nims, FNP

## 2022-11-03 NOTE — Telephone Encounter (Signed)
   Chief Complaint: Wheezing - cough - sinus congestion and drainage - fever last night Symptoms: above Frequency: 2 days Pertinent Negatives: Patient denies Chest pain Disposition: '[]'$ ED /'[]'$ Urgent Care (no appt availability in office) / '[]'$ Appointment(In office/virtual)/ '[x]'$  [place] [facility]/ '[]'$ Home Care/ '[]'$ Refused Recommended Disposition /'[]'$ [place] [facility]/ '[]'$  Follow-up with PCP Additional Notes: Pt started with S/S 2 days ago. Pt is wheezing, and coughing up yellow phlegm. PT thinks she had a fever last night. Pt has only taken Tylenol for fever. Pt has heart failure and is afraid to take OTC medications. Pt understands that she may be referred  to UC for in person evaluation. Also Hx of asthma.    Reason for Disposition  [1] Known COPD or other severe lung disease (i.e., bronchiectasis, cystic fibrosis, lung surgery) AND [2] worsening symptoms (i.e., increased sputum purulence or amount, increased breathing difficulty  Answer Assessment - Initial Assessment Questions 1. ONSET: "When did the cough begin?"      2 days ago 2. SEVERITY: "How bad is the cough today?"      Moderate 3. SPUTUM: "Describe the color of your sputum" (none, dry cough; clear, white, yellow, green)     Yellow 4. HEMOPTYSIS: "Are you coughing up any blood?" If so ask: "How much?" (flecks, streaks, tablespoons, etc.)     1st this morning  - flecks 5. DIFFICULTY BREATHING: "Are you having difficulty breathing?" If Yes, ask: "How bad is it?" (e.g., mild, moderate, severe)    - MILD: No SOB at rest, mild SOB with walking, speaks normally in sentences, can lie down, no retractions, pulse < 100.    - MODERATE: SOB at rest, SOB with minimal exertion and prefers to sit, cannot lie down flat, speaks in phrases, mild retractions, audible wheezing, pulse 100-120.    - SEVERE: Very SOB at rest, speaks in single words, struggling to breathe, sitting hunched forward, retractions, pulse > 120      Mild - Moderate 6.  FEVER: "Do you have a fever?" If Yes, ask: "What is your temperature, how was it measured, and when did it start?"     Yes - last night 7. CARDIAC HISTORY: "Do you have any history of heart disease?" (e.g., heart attack, congestive heart failure)      Heart failure 8. LUNG HISTORY: "Do you have any history of lung disease?"  (e.g., pulmonary embolus, asthma, emphysema)     asthma 9. PE RISK FACTORS: "Do you have a history of blood clots?" (or: recent major surgery, recent prolonged travel, bedridden)     no 10. OTHER SYMPTOMS: "Do you have any other symptoms?" (e.g., runny nose, wheezing, chest pain)       BA, Nose nose 11. PREGNANCY: "Is there any chance you are pregnant?" "When was your last menstrual period?"        12. TRAVEL: "Have you traveled out of the country in the last month?" (e.g., travel history, exposures)  Protocols used: Cough - Acute Productive-A-AH

## 2022-11-03 NOTE — Patient Instructions (Signed)

## 2022-11-08 ENCOUNTER — Encounter: Payer: Self-pay | Admitting: Gastroenterology

## 2022-11-15 ENCOUNTER — Telehealth: Payer: Self-pay | Admitting: Family

## 2022-11-15 NOTE — Telephone Encounter (Signed)
Left message for patient to call back and schedule Medicare Annual Wellness Visit (AWV) either virtually or phone    my jabber number [phone]   *due 08/20/22 awvi per [facility]    45 min for awv-i and in office appointments 30 min for awv-s  phone/virtual appointments

## 2022-11-22 ENCOUNTER — Other Ambulatory Visit: Payer: Self-pay | Admitting: Family

## 2022-11-22 DIAGNOSIS — I5020 Unspecified systolic (congestive) heart failure: Secondary | ICD-10-CM

## 2022-11-22 DIAGNOSIS — I428 Other cardiomyopathies: Secondary | ICD-10-CM

## 2022-11-28 ENCOUNTER — Ambulatory Visit (HOSPITAL_COMMUNITY): Payer: Medicare Other

## 2022-12-01 ENCOUNTER — Ambulatory Visit (AMBULATORY_SURGERY_CENTER): Payer: 59

## 2022-12-01 ENCOUNTER — Telehealth: Payer: Self-pay

## 2022-12-01 VITALS — Ht 68.0 in | Wt 276.0 lb

## 2022-12-01 DIAGNOSIS — Z1211 Encounter for screening for malignant neoplasm of colon: Secondary | ICD-10-CM

## 2022-12-01 MED ORDER — NA SULFATE-K SULFATE-MG SULF 17.5-3.13-1.6 GM/177ML PO SOLN: 1.0000 | Freq: Once | ORAL | 0 refills | Status: AC

## 2022-12-01 NOTE — Telephone Encounter (Signed)
Spoke with patient today and learned that she is scheduled for ECHO on 12/14/22.  Her previous ECHO on 05/16/22  shows EF 40% and NO Aortic Stenosis. Her colonoscopy is scheduled for 12/29/22.

## 2022-12-01 NOTE — Progress Notes (Signed)
No egg or soy allergy known to patient  No issues known to pt with past sedation with any surgeries or procedures Patient denies ever being told they had issues or difficulty with intubation  No FH of Malignant Hyperthermia Pt is not on diet pills Pt is not on  home 02  Pt is not on blood thinners  Pt denies issues with constipation  No A fib or A flutter Have any cardiac testing pending--yes 12/14/22 Echo Pt instructed to use [employer] or GoodRx for a price reduction on prep  Patient's chart reviewed by Osvaldo Angst CNRA prior to previsit and patient appropriate for the [place].  Previsit completed and red dot placed by patient's name on their procedure day (on provider's schedule).

## 2022-12-14 ENCOUNTER — Ambulatory Visit (HOSPITAL_COMMUNITY)
Admission: RE | Admit: 2022-12-14 | Discharge: 2022-12-14 | Disposition: A | Discharge status: Home / Self Care | Payer: 59 | Source: Ambulatory Visit | Attending: Cardiology | Admitting: Cardiology

## 2022-12-14 DIAGNOSIS — I5022 Chronic systolic (congestive) heart failure: Secondary | ICD-10-CM | POA: Insufficient documentation

## 2022-12-14 DIAGNOSIS — I110 Hypertensive heart disease with heart failure: Secondary | ICD-10-CM | POA: Insufficient documentation

## 2022-12-14 DIAGNOSIS — I351 Nonrheumatic aortic (valve) insufficiency: Secondary | ICD-10-CM | POA: Diagnosis not present

## 2022-12-14 LAB — ECHOCARDIOGRAM COMPLETE
Area-P 1/2: 2.91 cm2
P 1/2 time: 884 msec
S' Lateral: 5.9 cm

## 2022-12-15 ENCOUNTER — Telehealth: Payer: Self-pay | Admitting: *Deleted

## 2022-12-15 NOTE — Telephone Encounter (Signed)
Dr. Havery Moros,  Unfortunately yesterday's cardiac echo revealed an EF which is below the minimum for [place].  Her procedure will need to be done at the hospital.  Thanks,  Osvaldo Angst CRNA

## 2022-12-15 NOTE — Telephone Encounter (Signed)
Lm on vm for patient to return call to discuss upcoming colonoscopy appt. [place] colonoscopy appt cancelled.

## 2022-12-15 NOTE — Telephone Encounter (Signed)
Thanks for catching this Janet [name]. [place], can you cancel her procedure in the [facility] and book an office follow up with me to discuss if she wants to do a colonoscopy at the hospital or not. I can discuss options with her. thanks

## 2022-12-17 ENCOUNTER — Encounter: Payer: Self-pay | Admitting: Emergency Medicine

## 2022-12-17 ENCOUNTER — Ambulatory Visit
Admission: EM | Admit: 2022-12-17 | Discharge: 2022-12-17 | Disposition: A | Discharge status: Home / Self Care | Payer: 59 | Attending: Nurse Practitioner | Admitting: Nurse Practitioner

## 2022-12-17 DIAGNOSIS — R52 Pain, unspecified: Secondary | ICD-10-CM | POA: Diagnosis not present

## 2022-12-17 DIAGNOSIS — Z1152 Encounter for screening for COVID-19: Secondary | ICD-10-CM | POA: Insufficient documentation

## 2022-12-17 DIAGNOSIS — J111 Influenza due to unidentified influenza virus with other respiratory manifestations: Secondary | ICD-10-CM

## 2022-12-17 DIAGNOSIS — J4540 Moderate persistent asthma, uncomplicated: Secondary | ICD-10-CM | POA: Diagnosis not present

## 2022-12-17 DIAGNOSIS — R051 Acute cough: Secondary | ICD-10-CM | POA: Insufficient documentation

## 2022-12-17 DIAGNOSIS — R062 Wheezing: Secondary | ICD-10-CM

## 2022-12-17 MED ORDER — ALBUTEROL SULFATE (5 MG/ML) 0.5% IN NEBU: 2.5000 mg | INHALATION_SOLUTION | Freq: Four times a day (QID) | RESPIRATORY_TRACT | 0 refills | Status: DC | PRN

## 2022-12-17 MED ORDER — PREDNISONE 20 MG PO TABS: 40.0000 mg | ORAL_TABLET | Freq: Every day | ORAL | 0 refills | Status: AC

## 2022-12-17 MED ORDER — IPRATROPIUM-ALBUTEROL 0.5-2.5 (3) MG/3ML IN SOLN
3.0000 mL | Freq: Once | RESPIRATORY_TRACT | Status: AC
  Administered 2022-12-17: 3 mL via RESPIRATORY_TRACT

## 2022-12-17 MED ORDER — OSELTAMIVIR PHOSPHATE 75 MG PO CAPS: 75.0000 mg | ORAL_CAPSULE | Freq: Two times a day (BID) | ORAL | 0 refills | Status: DC

## 2022-12-17 MED ORDER — PROMETHAZINE-DM 6.25-15 MG/5ML PO SYRP: 2.5000 mL | ORAL_SOLUTION | Freq: Four times a day (QID) | ORAL | 0 refills | Status: DC | PRN

## 2022-12-17 NOTE — Discharge Instructions (Signed)
Tamiflu twice daily for 5 days Promethazine DM as needed for cough.  Please note this medication can make you drowsy.  Do not drink alcohol or drive on this medication Prednisone daily for 5 days Refilled your albuterol nebulizer solution as requested Over-the-counter Tylenol or ibuprofen as needed for fever reduction Rest and fluids Follow-up with your PCP in 2 to 3 days for recheck Please go to the emergency room if you have any worsening symptoms

## 2022-12-17 NOTE — ED Triage Notes (Signed)
Patient c/o cough, bodyaches, wheezing, taste and smell come and goes x 2 days.  Patient doing at home nebulizers.

## 2022-12-17 NOTE — ED Provider Notes (Signed)
[facility]    CSN: [id number] Arrival date & time: 12/17/22  1502      History   Chief Complaint Chief Complaint  Patient presents with   Cough    HPI Janet [name] is a 57 y.o. adult who presents for evaluation of URI symptoms for 2 days. Patient reports associated symptoms of cough, congestion, fever, chills, body aches, wheezing. Denies N/V/D, ear pain, sore throat, shortness of breath. Patient does have a hx of asthma.  Has an albuterol inhaler and nebulizer and has been using with temporary relief of her wheezing.  no smoking.  Family members have similar symptoms.  No recent travel. Pt is vaccinated for COVID. Pt is not vaccinated for flu this season. Pt has taken cough medicine OTC for symptoms. Pt has no other concerns at this time.    Cough Associated symptoms: fever, myalgias and wheezing     Past Medical History:  Diagnosis Date   Asthma    CHF (congestive heart failure) (HCC)    Congestive heart failure (CHF) (HCC)    Depression    Elevated brain natriuretic peptide (BNP) level    Hypertension    Lymphedema    SOB (shortness of breath)    Substance abuse [facility])     Patient Active Problem List   Diagnosis Date Noted   Prediabetes 50/53/9767   Acute systolic heart failure (HCC)    Essential hypertension 02/17/2021   Asthma    Lymphedema    Elevated brain natriuretic peptide (BNP) level    DOE (dyspnea on exertion) 07/28/2020   Morbid obesity ([place]) 07/28/2020   Chest wall pain 06/01/2017    Past Surgical History:  Procedure Laterality Date   ABDOMINAL HYSTERECTOMY     BREAST BIOPSY Right    CESAREAN SECTION     HERNIA REPAIR     umbilical as a child   RIGHT/LEFT HEART CATH AND CORONARY ANGIOGRAPHY N/A 04/01/2021   Procedure: RIGHT/LEFT HEART CATH AND CORONARY ANGIOGRAPHY;  Surgeon: Jettie Booze, MD;  Location: [place] [facility];  Service: Cardiovascular;  Laterality: N/A;   TUBAL LIGATION      OB History   No obstetric  history on file.      Home Medications    Prior to Admission medications   Medication Sig Start Date End Date Taking? Authorizing Provider  albuterol (VENTOLIN HFA) 108 (90 Base) MCG/ACT inhaler Inhale 1-2 puffs into the lungs every 6 (six) hours as needed for wheezing or shortness of breath. 05/19/22  Yes Camillia Herter, NP  aspirin 81 MG chewable tablet Chew 81 mg by mouth daily. aspirin 81 mg chewable tablet   Yes [provider]  Budeson-Glycopyrrol-Formoterol (BREZTRI AEROSPHERE) 160-9-4.8 MCG/ACT AERO Inhale 2 puffs into the lungs in the morning and at bedtime. 06/08/21  Yes Hunsucker, Bonna Gains, MD  carvedilol (COREG) 12.5 MG tablet Take 1 tablet (12.5 mg total) by mouth 2 (two) times daily. 10/27/22  Yes Sabharwal, Aditya, DO  fluticasone (FLONASE) 50 MCG/ACT nasal spray Place 2 sprays into both nostrils daily. 03/21/21  Yes Minette Brine, Amy J, NP  furosemide (LASIX) 20 MG tablet Take 1 tablet (20 mg total) by mouth daily. 09/01/22  Yes Sabharwal, Aditya, DO  hydrOXYzine (ATARAX) 10 MG tablet Take 1 tablet (10 mg total) by mouth 2 (two) times daily. 12/26/21  Yes Nahser, Wonda Cheng, MD  ibuprofen (ADVIL) 600 MG tablet Take 600 mg by mouth daily as needed for mild pain. 10/06/20  Yes [provider]  montelukast (SINGULAIR) 10 MG tablet TAKE 1 TABLET(10 MG) BY MOUTH AT BEDTIME 08/31/22  Yes Minette Brine, Amy J, NP  Multiple Vitamins-Minerals (ADULT ONE DAILY GUMMIES) CHEW Chew 2 tablets by mouth daily with breakfast.   Yes [provider]  oseltamivir (TAMIFLU) 75 MG capsule Take 1 capsule (75 mg total) by mouth every 12 (twelve) hours. 12/17/22  Yes Melynda Ripple, NP  predniSONE (DELTASONE) 20 MG tablet Take 2 tablets (40 mg total) by mouth daily with breakfast for 5 days. 12/17/22 12/22/22 Yes Melynda Ripple, NP  promethazine-dextromethorphan (PROMETHAZINE-DM) 6.25-15 MG/5ML syrup Take 2.5 mLs by mouth 4 (four) times daily as needed for cough. 12/17/22  Yes Melynda Ripple, NP   sacubitril-valsartan (ENTRESTO) 49-51 MG Take 1 tablet by mouth 2 (two) times daily. 09/01/22  Yes Sabharwal, Aditya, DO  spironolactone (ALDACTONE) 25 MG tablet TAKE 1 TABLET(25 MG) BY MOUTH DAILY 02/27/22  Yes Nahser, Wonda Cheng, MD  traZODone (DESYREL) 150 MG tablet Take 150 mg by mouth at bedtime.   Yes [provider]  albuterol (PROVENTIL) (5 MG/ML) 0.5% nebulizer solution Take 0.5 mLs (2.5 mg total) by nebulization every 6 (six) hours as needed for wheezing or shortness of breath. 12/17/22   Melynda Ripple, NP  ARIPiprazole (ABILIFY) 5 MG tablet Take 5 mg by mouth daily. 07/26/22   [provider]  dapagliflozin propanediol (FARXIGA) 10 MG TABS tablet Take 1 tablet (10 mg total) by mouth daily. 12/26/21   Nahser, Wonda Cheng, MD    Family History Family History  Problem Relation Age of Onset   Colon polyps Mother    Hypertension Mother    Stroke Mother    Deep vein thrombosis Mother    Congestive Heart Failure Mother    Other Father        history unknown   Obesity Sister    Obesity Brother    Congestive Heart Failure Maternal Grandmother    Colon cancer Neg Hx    Esophageal cancer Neg Hx    Rectal cancer Neg Hx    Stomach cancer Neg Hx     Social History Social History   Tobacco Use   Smoking status: Never    Passive exposure: Yes   Smokeless tobacco: Never  Vaping Use   Vaping Use: Never used  Substance Use Topics   Alcohol use: Yes    Comment: occasional   Drug use: Not Currently    Types: Marijuana    Comment: uses marijuana twice a week     Allergies   Clarithromycin and Penicillins   Review of Systems Review of Systems  Constitutional:  Positive for fever.  HENT:  Positive for congestion.   Respiratory:  Positive for cough and wheezing.   Musculoskeletal:  Positive for myalgias.     Physical Exam Triage Vital Signs ED Triage Vitals  Enc Vitals Group     BP 12/17/22 1510 119/78     Pulse Rate 12/17/22 1510 84     Resp 12/17/22 1510  20     Temp 12/17/22 1510 (!) 100.4 F (38 C)     Temp Source 12/17/22 1510 Oral     SpO2 12/17/22 1510 93 %     Weight 12/17/22 1515 275 lb (124.7 kg)     Height 12/17/22 1515 '5\' 8"'$  (1.727 m)     Head Circumference --      Peak Flow --      Pain Score 12/17/22 1515 9     Pain Loc --  Pain Edu? --      Excl. in [place]? --    No data found.  Updated Vital Signs BP 119/78 (BP Location: Left Arm)   Pulse 84   Temp (!) 100.4 F (38 C) (Oral)   Resp 20   Ht '5\' 8"'$  (1.727 m)   Wt 275 lb (124.7 kg)   SpO2 93%   BMI 41.81 kg/m   Visual Acuity Right Eye Distance:   Left Eye Distance:   Bilateral Distance:    Right Eye Near:   Left Eye Near:    Bilateral Near:     Physical Exam Vitals and nursing note reviewed.  Constitutional:      General: She is not in acute distress.    Appearance: She is well-developed. She is not ill-appearing.  HENT:     Head: Normocephalic and atraumatic.     Right Ear: Tympanic membrane and ear canal normal.     Left Ear: Tympanic membrane and ear canal normal.     Nose: Congestion present.     Mouth/Throat:     Mouth: Mucous membranes are moist.     Pharynx: Oropharynx is clear. Uvula midline. No oropharyngeal exudate or posterior oropharyngeal erythema.     Tonsils: No tonsillar exudate or tonsillar abscesses.  Eyes:     Conjunctiva/sclera: Conjunctivae normal.     Pupils: Pupils are equal, round, and reactive to light.  Cardiovascular:     Rate and Rhythm: Normal rate and regular rhythm.     Heart sounds: Normal heart sounds.  Pulmonary:     Effort: Pulmonary effort is normal.     Breath sounds: Wheezing present.     Comments: Expiratory wheezes bilateral bases Musculoskeletal:     Cervical back: Normal range of motion and neck supple.  Lymphadenopathy:     Cervical: No cervical adenopathy.  Skin:    General: Skin is warm and dry.  Neurological:     General: No focal deficit present.     Mental Status: She is alert and oriented to  person, place, and time.  Psychiatric:        Mood and Affect: Mood normal.        Behavior: Behavior normal.      UC Treatments / Results  Labs (all labs ordered are listed, but only abnormal results are displayed) Labs Reviewed  SARS CORONAVIRUS 2 (TAT 6-24 HRS)    EKG   Radiology No results found.  Procedures Procedures (including critical care time)  Medications Ordered in UC Medications  ipratropium-albuterol (DUONEB) 0.5-2.5 (3) MG/3ML nebulizer solution 3 mL (3 mLs Nebulization Given 12/17/22 1530)    Initial Impression / Assessment and Plan / UC Course  I have reviewed the triage vital signs and the nursing notes.  Pertinent labs & imaging results that were available during my care of the patient were reviewed by me and considered in my medical decision making (see chart for details).     Reviewed exam and symptoms with patient.  Wheezing improved after nebulizer COVID PCR and will contact if positive.  Patient is interested in Paxlovid if she is positive but medication review will have to be done prior to prescribing Discussed symptoms consistent with influenza, start Tamiflu Prednisone daily for 5 days Refilled albuterol nebulizer solution as requested Promethazine DM as needed for cough.  Side effect profile reviewed.  Patient states insurance does not cover Tessalon Rest and fluids Follow-up with PCP in 2 to 3 days for recheck Strict ER precautions reviewed  and patient verbalized understanding Final Clinical Impressions(s) / UC Diagnoses   Final diagnoses:  Wheezing  Acute cough  Body aches  Influenza-like illness     Discharge Instructions      Tamiflu twice daily for 5 days Promethazine DM as needed for cough.  Please note this medication can make you drowsy.  Do not drink alcohol or drive on this medication Prednisone daily for 5 days Refilled your albuterol nebulizer solution as requested Over-the-counter Tylenol or ibuprofen as needed for  fever reduction Rest and fluids Follow-up with your PCP in 2 to 3 days for recheck Please go to the emergency room if you have any worsening symptoms     ED Prescriptions     Medication Sig Dispense Auth. Provider   oseltamivir (TAMIFLU) 75 MG capsule Take 1 capsule (75 mg total) by mouth every 12 (twelve) hours. 10 capsule Melynda Ripple, NP   predniSONE (DELTASONE) 20 MG tablet Take 2 tablets (40 mg total) by mouth daily with breakfast for 5 days. 10 tablet Melynda Ripple, NP   albuterol (PROVENTIL) (5 MG/ML) 0.5% nebulizer solution Take 0.5 mLs (2.5 mg total) by nebulization every 6 (six) hours as needed for wheezing or shortness of breath. 20 mL Melynda Ripple, NP   promethazine-dextromethorphan (PROMETHAZINE-DM) 6.25-15 MG/5ML syrup Take 2.5 mLs by mouth 4 (four) times daily as needed for cough. 118 mL Melynda Ripple, NP      PDMP not reviewed this encounter.   Melynda Ripple, NP 12/17/22 [phone]

## 2022-12-18 LAB — SARS CORONAVIRUS 2 (TAT 6-24 HRS): SARS Coronavirus 2: NEGATIVE

## 2022-12-18 NOTE — Telephone Encounter (Signed)
Lm on vm for patient to return call 

## 2022-12-19 NOTE — Telephone Encounter (Signed)
Pt returned call. She apologized for not answering, she has been sick with the flu. I explained to patient the reason for cancelling [place] colonoscopy. Pt has been scheduled for a follow up with Dr. Havery Moros on Monday, 02/26/23 at 1:20 pm. Pt is aware that appt information will be available in MyChart. Pt verbalized understanding of all information and had no concerns at the end of the call.

## 2022-12-20 IMAGING — MG MM DIGITAL SCREENING BILAT W/ TOMO AND CAD
8 series · 8 of 24 positions shown · non-contrast
Comparison: No prior exams

CLINICAL DATA: Screening.

EXAM:
DIGITAL SCREENING BILATERAL MAMMOGRAM WITH TOMOSYNTHESIS AND CAD
TECHNIQUE: Bilateral screening digital craniocaudal and mediolateral oblique
mammograms were obtained. Bilateral screening digital breast
tomosynthesis was performed. The images were evaluated with
computer-aided detection.

[R CC synth-2D]
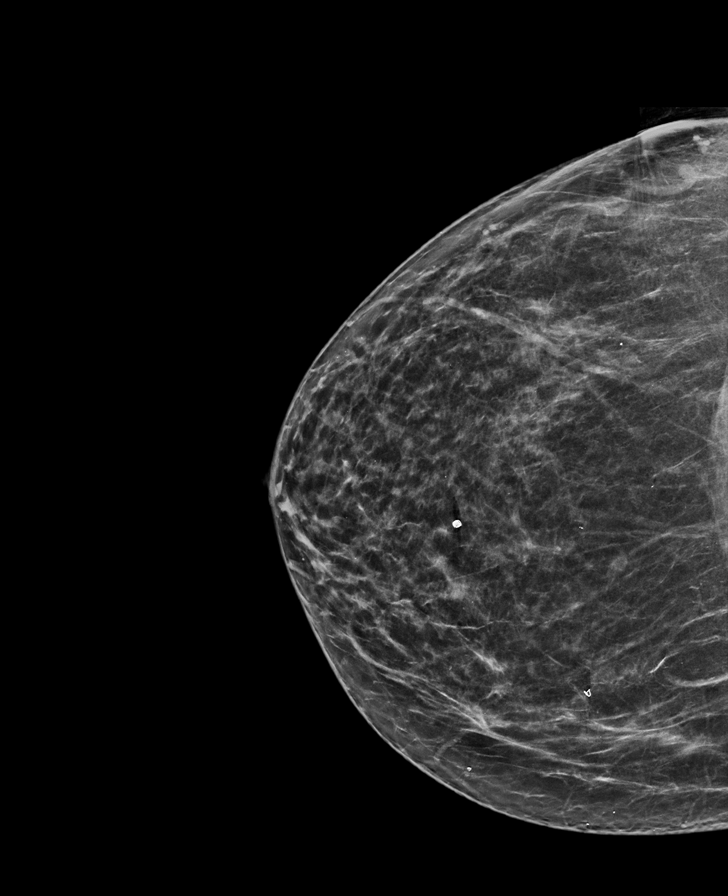

[R MLO synth-2D]
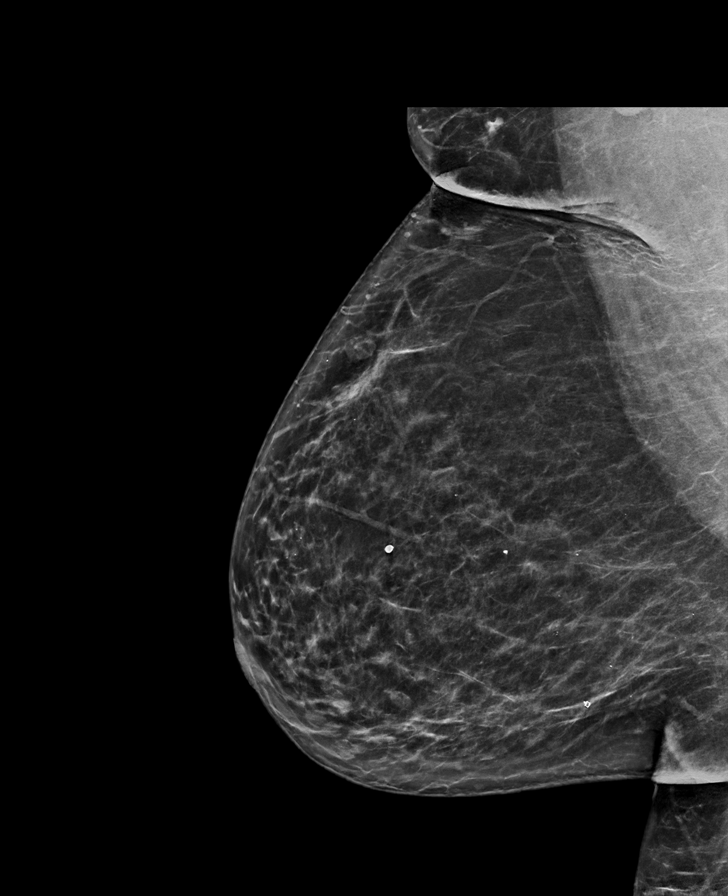

[L CC synth-2D]
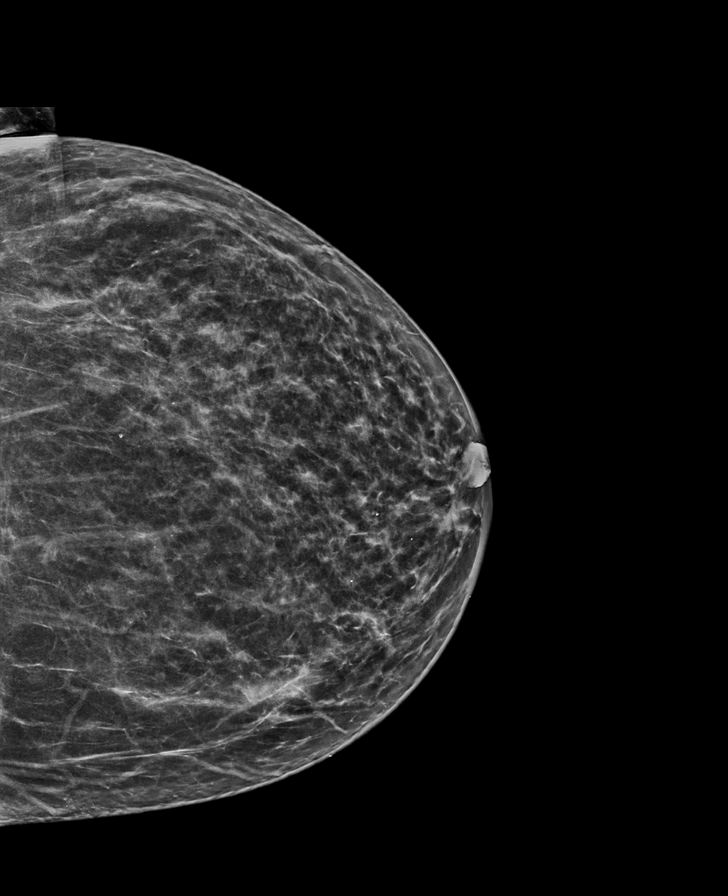

[L MLO synth-2D]
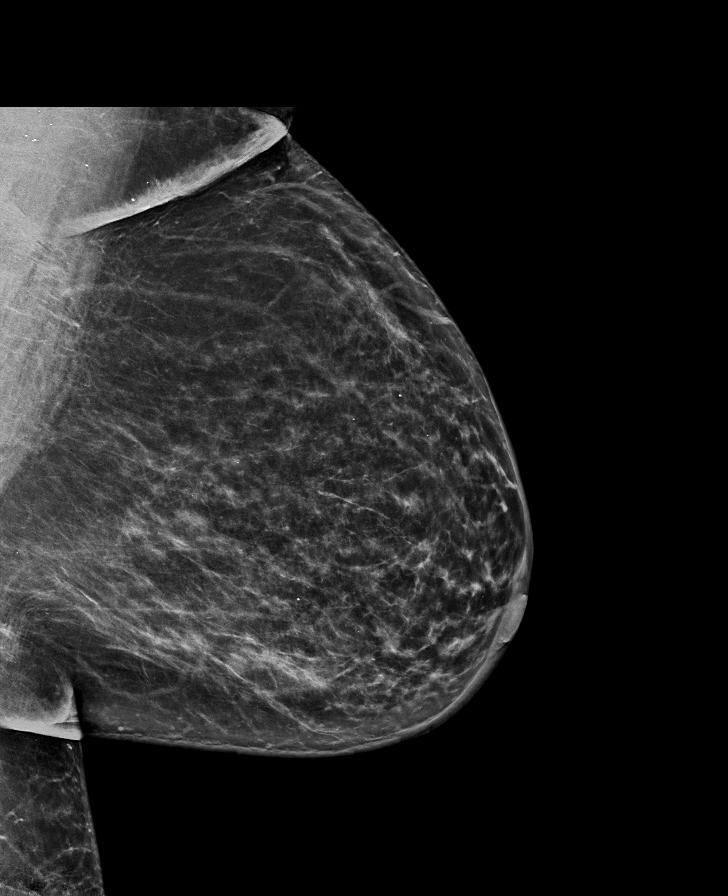

[L MLO tomo · tomo slice 42/83.0]
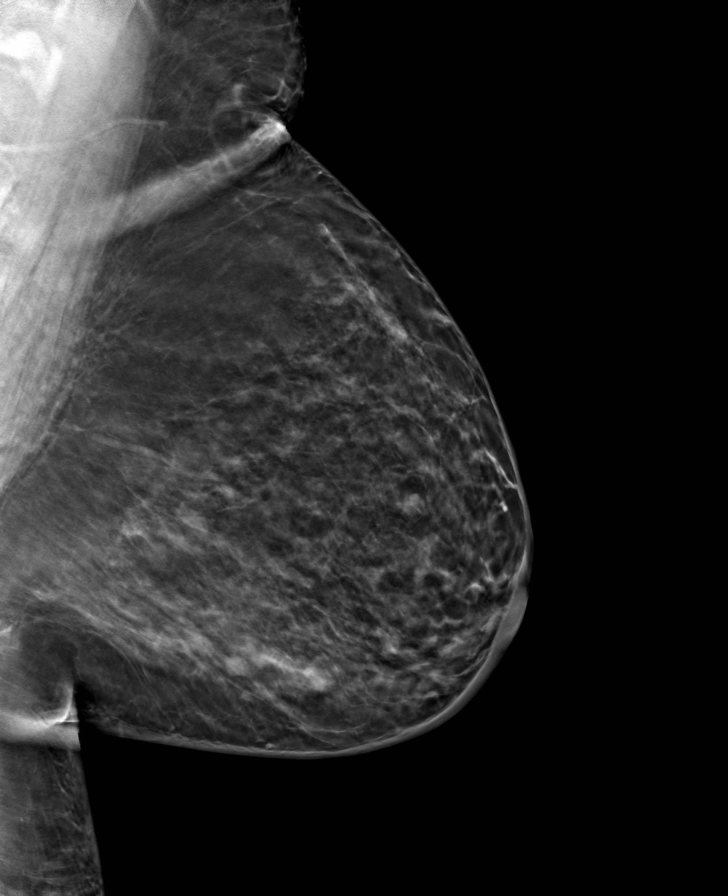

[R MLO tomo · tomo slice 39/78.0]
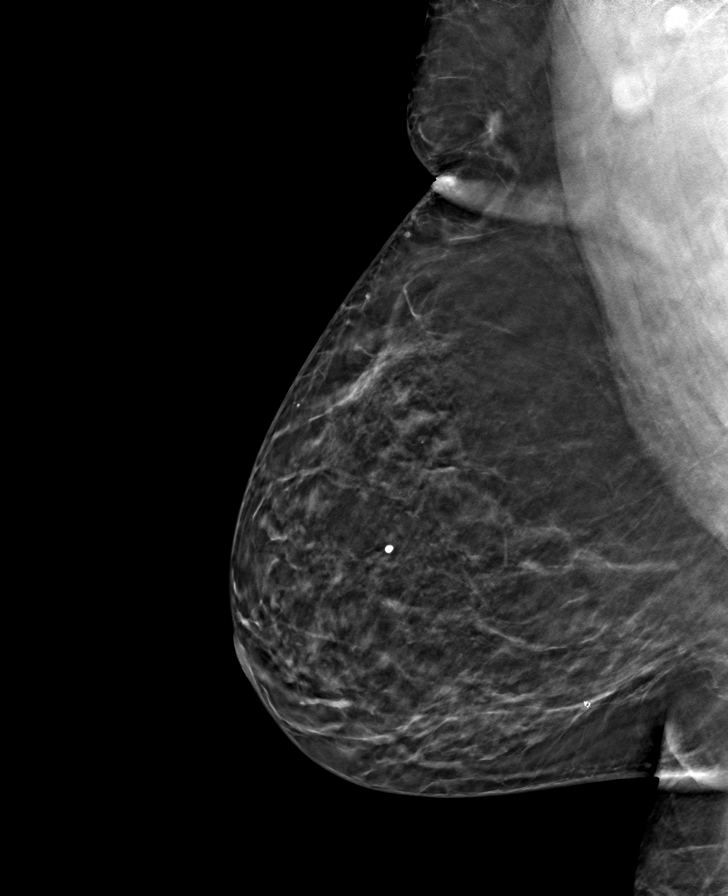

[L CC tomo · tomo slice 37/73.0]
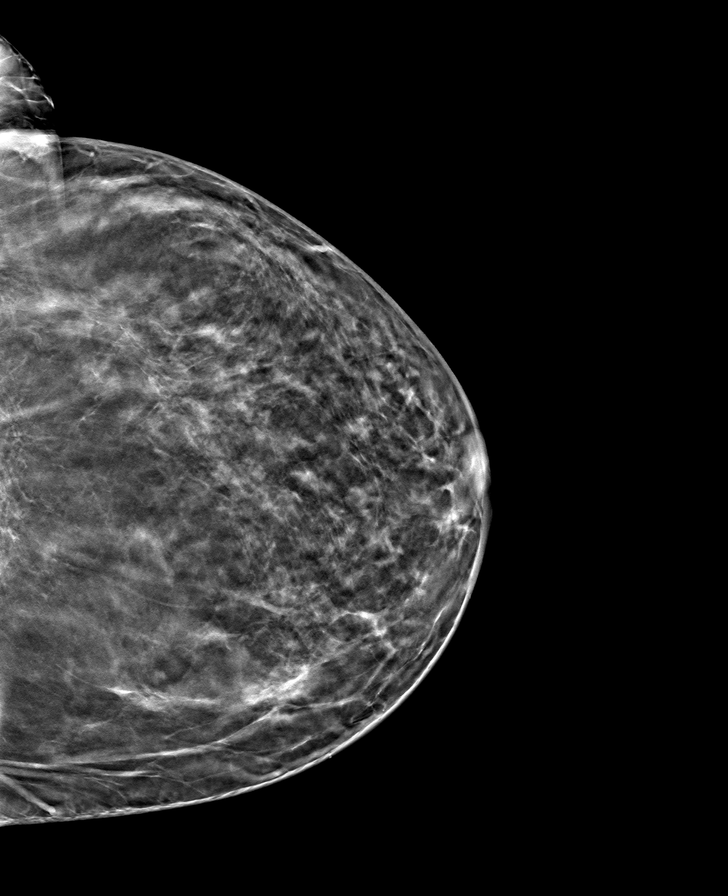

[R CC tomo · tomo slice 35/69.0]
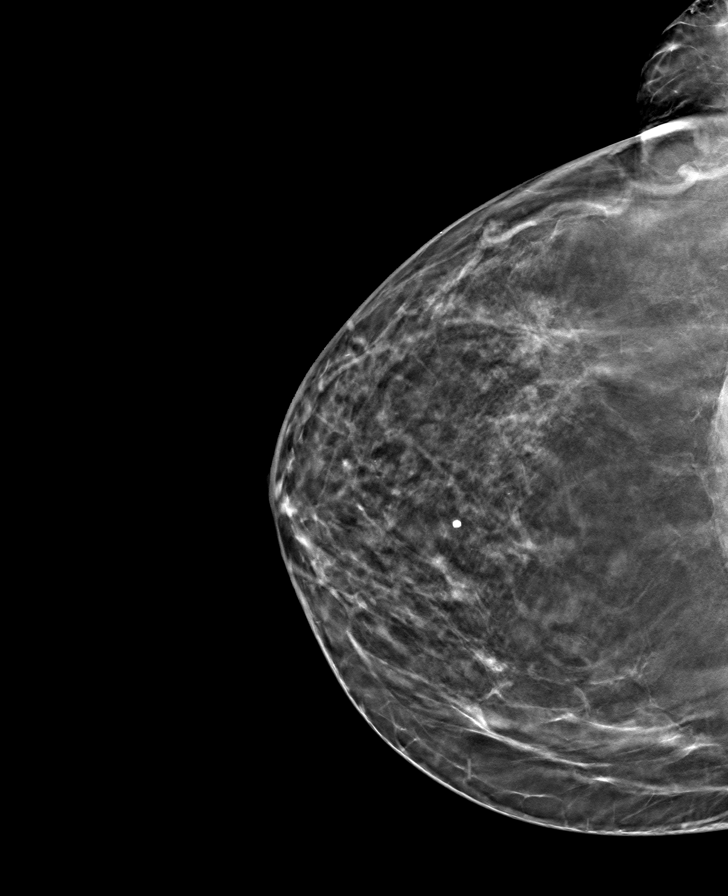

[8 of 24 positions shown; findings below may reference images not displayed]

ACR Breast Density Category b: There are scattered areas of
fibroglandular density.
FINDINGS: In the left breast, a possible mass warrants further evaluation. In
the right breast, no findings suspicious for malignancy.
IMPRESSION: Further evaluation is suggested for a possible mass in the left
breast.

RECOMMENDATION:
Diagnostic mammogram and possibly ultrasound of the left breast.
(Code:[id number])

The patient will be contacted regarding the findings, and additional
imaging will be scheduled.

BI-RADS CATEGORY  0: Incomplete. Need additional imaging evaluation
and/or prior mammograms for comparison.

## 2022-12-27 ENCOUNTER — Other Ambulatory Visit: Payer: Self-pay

## 2022-12-27 DIAGNOSIS — J4540 Moderate persistent asthma, uncomplicated: Secondary | ICD-10-CM

## 2022-12-27 MED ORDER — ALBUTEROL SULFATE HFA 108 (90 BASE) MCG/ACT IN AERS: 1.0000 | INHALATION_SPRAY | Freq: Four times a day (QID) | RESPIRATORY_TRACT | 1 refills | Status: DC | PRN

## 2022-12-29 ENCOUNTER — Encounter: Payer: Medicare Other | Admitting: Gastroenterology

## 2023-01-02 ENCOUNTER — Other Ambulatory Visit: Payer: Self-pay | Admitting: Family

## 2023-01-02 DIAGNOSIS — J4540 Moderate persistent asthma, uncomplicated: Secondary | ICD-10-CM

## 2023-01-02 NOTE — Telephone Encounter (Signed)
Order complete. 

## 2023-01-08 ENCOUNTER — Encounter (HOSPITAL_COMMUNITY): Payer: Self-pay | Admitting: Cardiology

## 2023-01-08 ENCOUNTER — Ambulatory Visit (HOSPITAL_COMMUNITY)
Admission: RE | Admit: 2023-01-08 | Discharge: 2023-01-08 | Disposition: A | Discharge status: Home / Self Care | Payer: 59 | Source: Ambulatory Visit | Attending: Cardiology | Admitting: Cardiology

## 2023-01-08 VITALS — BP 100/60 | HR 68 | Wt 279.4 lb

## 2023-01-08 DIAGNOSIS — J45909 Unspecified asthma, uncomplicated: Secondary | ICD-10-CM | POA: Diagnosis not present

## 2023-01-08 DIAGNOSIS — F1910 Other psychoactive substance abuse, uncomplicated: Secondary | ICD-10-CM | POA: Insufficient documentation

## 2023-01-08 DIAGNOSIS — E119 Type 2 diabetes mellitus without complications: Secondary | ICD-10-CM | POA: Insufficient documentation

## 2023-01-08 DIAGNOSIS — I10 Essential (primary) hypertension: Secondary | ICD-10-CM

## 2023-01-08 DIAGNOSIS — E669 Obesity, unspecified: Secondary | ICD-10-CM | POA: Diagnosis not present

## 2023-01-08 DIAGNOSIS — I428 Other cardiomyopathies: Secondary | ICD-10-CM | POA: Diagnosis not present

## 2023-01-08 DIAGNOSIS — Z95810 Presence of automatic (implantable) cardiac defibrillator: Secondary | ICD-10-CM | POA: Diagnosis not present

## 2023-01-08 DIAGNOSIS — I110 Hypertensive heart disease with heart failure: Secondary | ICD-10-CM | POA: Diagnosis not present

## 2023-01-08 DIAGNOSIS — Z79899 Other long term (current) drug therapy: Secondary | ICD-10-CM | POA: Insufficient documentation

## 2023-01-08 DIAGNOSIS — I5022 Chronic systolic (congestive) heart failure: Secondary | ICD-10-CM | POA: Diagnosis not present

## 2023-01-08 LAB — BASIC METABOLIC PANEL
Anion gap: 10 (ref 5–15)
BUN: 12 mg/dL (ref 6–20)
CO2: 22 mmol/L (ref 22–32)
Calcium: 9.3 mg/dL (ref 8.9–10.3)
Chloride: 107 mmol/L (ref 98–111)
Creatinine, Ser: 1.08 mg/dL — ABNORMAL HIGH (ref 0.44–1.00)
GFR, Estimated: >60 mL/min (ref >60)
Glucose, Bld: 101 mg/dL — ABNORMAL HIGH (ref 70–99)
Potassium: 4.1 mmol/L (ref 3.5–5.1)
Sodium: 139 mmol/L (ref 135–145)

## 2023-01-08 LAB — BRAIN NATRIURETIC PEPTIDE: B Natriuretic Peptide: 72.1 pg/mL (ref 0.0–100.0)

## 2023-01-08 NOTE — Progress Notes (Signed)
ReDS Vest / Clip - 01/08/23 1000       ReDS Vest / Clip   Station Marker D    Ruler Value 34    ReDS Value Range Moderate volume overload    ReDS Actual Value 36

## 2023-01-08 NOTE — Addendum Note (Signed)
Encounter addended by: Stanford Scotland, RN on: 01/08/2023 10:47 AM  Actions taken: Flowsheet accepted, Clinical Note Signed

## 2023-01-08 NOTE — Progress Notes (Signed)
ADVANCED HEART FAILURE CLINIC NOTE  Referring Physician: Camillia Herter, NP  Primary Care: Camillia Herter, NP Primary Cardiologist: Mertie Moores, MD  HPI: Janet [name] is a very pleasant 57 y.o. adult with T2DM, asthma, HTN, obesity, hx of substance abuse and HFrEF presenting today to establish care. Janet [name] reports being initially diagnosed with CHF in early 2022 after presenting to the hospital with shortness of breath and chest pressure.  She believes her CHF was brought on by use of crack/cocaine.  Since diagnosis to her LVEF has ranged from 25% to 40%.  While she has worked very hard to control her substance use, she believes that her drops and LV function have been due to unfortunate relapses.  Today she reports being free of any type of drug use for roughly 8 to 9 months.  Her last relapse was after the death of a close family member.  Interval hx: Since her last visit, Janet [name] reports that she has gained weight. She has also not been taking her lasix as prescribed; reports that she has been feeling lazy about taking it. She has a colonoscopy pending. She feels that her breathing is now slightly worse. She is having more frequent episodes of PND lately also.   Activity level/exercise tolerance: NYHA IIb Orthopnea:  Sleeps on 2 pillows Paroxysmal noctural dyspnea: yes, infrequent.  Chest pain/pressure: No Orthostatic lightheadedness: No Palpitations: No Lower extremity edema: Intermittent Presyncope/syncope: No Cough: No  Past Medical History:  Diagnosis Date   Asthma    CHF (congestive heart failure) (HCC)    Congestive heart failure (CHF) (HCC)    Depression    Elevated brain natriuretic peptide (BNP) level    Hypertension    Lymphedema    SOB (shortness of breath)    Substance abuse (HCC)     Current Outpatient Medications  Medication Sig Dispense Refill   albuterol (PROVENTIL) (5 MG/ML) 0.5% nebulizer solution Take 0.5 mLs (2.5 mg total) by  nebulization every 6 (six) hours as needed for wheezing or shortness of breath. 20 mL 0   albuterol (VENTOLIN HFA) 108 (90 Base) MCG/ACT inhaler Inhale 1-2 puffs into the lungs every 6 (six) hours as needed for wheezing or shortness of breath. 1 each 1   aspirin 81 MG chewable tablet Chew 81 mg by mouth daily. aspirin 81 mg chewable tablet     Budeson-Glycopyrrol-Formoterol (BREZTRI AEROSPHERE) 160-9-4.8 MCG/ACT AERO Inhale 2 puffs into the lungs in the morning and at bedtime. 10.7 g 5   carvedilol (COREG) 12.5 MG tablet Take 1 tablet (12.5 mg total) by mouth 2 (two) times daily. 180 tablet 3   dapagliflozin propanediol (FARXIGA) 10 MG TABS tablet Take 1 tablet (10 mg total) by mouth daily. 30 tablet 5   fluticasone (FLONASE) 50 MCG/ACT nasal spray Place 2 sprays into both nostrils daily. 16 g 0   furosemide (LASIX) 20 MG tablet Take 1 tablet (20 mg total) by mouth daily. 30 tablet 5   hydrOXYzine (ATARAX) 50 MG tablet Take 50 mg by mouth at bedtime.     ibuprofen (ADVIL) 600 MG tablet Take 600 mg by mouth daily as needed for mild pain.     montelukast (SINGULAIR) 10 MG tablet TAKE 1 TABLET(10 MG) BY MOUTH AT BEDTIME 30 tablet 2   Multiple Vitamins-Minerals (ADULT ONE DAILY GUMMIES) CHEW Chew 2 tablets by mouth daily with breakfast.     sacubitril-valsartan (ENTRESTO) 49-51 MG Take 1 tablet by mouth 2 (two) times daily. 60 tablet  3   spironolactone (ALDACTONE) 25 MG tablet TAKE 1 TABLET(25 MG) BY MOUTH DAILY 90 tablet 3   traZODone (DESYREL) 150 MG tablet Take 150 mg by mouth at bedtime.     No current facility-administered medications for this encounter.    Allergies  Allergen Reactions   Clarithromycin Other (See Comments)    GI upset   Penicillins Rash, Hives and Other (See Comments)      Social History   Socioeconomic History   Marital status: Single    Spouse name: Not on file   Number of children: 2   Years of education: 14   Highest education level: Associate degree: academic  program  Occupational History   Occupation: Ship broker   Occupation: disabled  Tobacco Use   Smoking status: Never    Passive exposure: Yes   Smokeless tobacco: Never  Vaping Use   Vaping Use: Never used  Substance and Sexual Activity   Alcohol use: Yes    Comment: occasional   Drug use: Not Currently    Types: Marijuana    Comment: uses marijuana twice a week   Sexual activity: Yes    Birth control/protection: Surgical  Other Topics Concern   Not on file  Social History Narrative   Not on file   Social Determinants of Health   Financial Resource Strain: Not on file  Food Insecurity: Not on file  Transportation Needs: Not on file  Physical Activity: Not on file  Stress: Not on file  Social Connections: Not on file  Intimate Partner Violence: Not on file      Family History  Problem Relation Age of Onset   Colon polyps Mother    Hypertension Mother    Stroke Mother    Deep vein thrombosis Mother    Congestive Heart Failure Mother    Other Father        history unknown   Obesity Sister    Obesity Brother    Congestive Heart Failure Maternal Grandmother    Colon cancer Neg Hx    Esophageal cancer Neg Hx    Rectal cancer Neg Hx    Stomach cancer Neg Hx     PHYSICAL EXAM: Vitals:   01/08/23 1014  BP: 100/60  Pulse: 68  SpO2: 96%   GENERAL: Well nourished, well developed, and in no apparent distress at rest.  HEENT: Negative for arcus senilis or xanthelasma. There is no scleral icterus.  The mucous membranes are pink and moist.   NECK: Supple, No masses. Normal carotid upstrokes without bruits. No masses or thyromegaly.    CHEST: There are no chest wall deformities. There is no chest wall tenderness. Respirations are unlabored.  Lungs-CTA bilaterally CARDIAC:  JVP: difficult to assess due to body habitus.          Normal S1, S2  Normal rate with regular rhythm. No murmurs, rubs or gallops.  Pulses are 2+ and symmetrical in upper and lower extremities. 1+  edema, warm to touch.  ABDOMEN: Soft, non-tender, non-distended. There are no masses or hepatomegaly. There are normal bowel sounds.  EXTREMITIES: Warm, 1+ edema.  LYMPHATIC: No axillary or supraclavicular lymphadenopathy.  NEUROLOGIC: Patient is oriented x3 with no focal or lateralizing neurologic deficits.  PSYCH: Patients affect is appropriate, there is no evidence of anxiety or depression.  SKIN: Warm and dry; no lesions or wounds.   DATA REVIEW  ECG: Sinus rhythm with LVH  ECHO: 05/16/22: LVEF 40%, RV function normal.  09/22/21: LVEF 30-35%, RV normal.  LA moderately dilated.  12/14/22: LVEF 35%; normal RV function.   CATH: [id number]:  LV end diastolic pressure is moderately elevated. There is no aortic valve stenosis. Hemodynamic findings consistent with mild pulmonary hypertension. Ao sat 99%, PA sat 73%, mean PA pressure 33 mmHg, mean PCWP 22 mmHg, cardiac output 6.2 L/min, cardiac index 2.6. No angiographically apparent coronary artery disease. Significant tortuosity noted in the RCA and the circumflex.  CMR (08/07/22): 1. Moderate LVE/LVH with abnormal septal motion and global hypokinesis LVEF 23%  2. Mild non specific mid myocardial gadolinium uptake in anterior wall  3.  Low normal RVEF 45%  4.  Tri leaflet AV with mild AR Regurgitant fraction 19%  5.  Mild Ascending thoracic aorta/sinus dilatation 3.8 cm  ASSESSMENT & PLAN:  Stage C, HFrEF, Nonischemic CMP Etiology of [id number], likely related to subustance abuse NYHA class / AHA Stage:IIB  Volume status & Diuretics: Mildly hypervolemic; REDs today 36% .Will increase lasix to 28m for the next 3 days.  Vasodilators:Entresto 49/529mBID Beta-Blocker: continue Coreg to 12.5 mg twice daily MRA: Spironolactone 2536maily Cardiometabolic: Continue Farxiga 10 mg daily Devices therapies & Valvulopathies: LVEF 35%; will continue GDMT before pursuing ICD placement.  Advanced therapies:Not indicated  2. HTN - See  above, Entresto 49/51 - well controlled.   3. Obesity  - Discussed lifestyle modifications   4. Substance abuse -Doing very well now.  Compliant with medications.  Compliant with appointments.  No recent history of substance abuse.  Remains very motivated to continue to improve her health.  5. Shortness of breath - using inhalers more frequently.  - No PFTs on record.  - Will obtain PFTs.   Quamere Mussell Advanced Heart Failure Mechanical Circulatory Support

## 2023-01-08 NOTE — Patient Instructions (Signed)
INCREASE Lasix to 51m for 3 days, then return to normal dose.  Labs done today, your results will be available in MyChart, we will contact you for abnormal readings.  Your physician has recommended that you have a pulmonary function test. Pulmonary Function Tests are a group of tests that measure how well air moves in and out of your lungs. YOU WILL BE CALLED TO HAVE THIS TEST ARRANGED.  Your physician recommends that you schedule a follow-up appointment in: 4 months (June 2024)  ** please call the office in April to arrange your follow up appointment. **  If you have any questions or concerns before your next appointment please send u[place]aa message through [email or web address] call our office at [phone]    TO LEAVE A MESSAGE FOR THE NURSE SELECT OPTION 2, PLEASE LEAVE A MESSAGE INCLUDING: YOUR NAME DATE OF BIRTH CALL BACK NUMBER REASON FOR CALL**this is important as we prioritize the call backs  YOU WILL RECEIVE A CALL BACK THE SAME DAY AS LONG AS YOU CALL BEFORE 4:00 PM  At the [facility] you and your health needs are our priority. As part of our continuing mission to provide you with exceptional heart care, we have created designated Provider Care Teams. These Care Teams include your primary Cardiologist (physician) and Advanced Practice Providers (APPs- Physician Assistants and Nurse Practitioners) who all work together to provide you with the care you need, when you need it.   You may see any of the following providers on your designated Care Team at your next follow up: Dr DGlori BickersDr DLoralie ChampagneDr. ARoxana Hires NP BLyda Jester PUtahJSomers[facility] CenterLBur[facility] PUtahAForestine Na NP LAudry Riles PharmD   Please be sure to bring in all your medications bottles to every appointment.    Thank you for choosing [facility]

## 2023-01-08 NOTE — Addendum Note (Signed)
Encounter addended by: Stanford Scotland, RN on: 01/08/2023 11:06 AM  Actions taken: Charge Capture section accepted

## 2023-01-15 ENCOUNTER — Ambulatory Visit (INDEPENDENT_AMBULATORY_CARE_PROVIDER_SITE_OTHER): Payer: 59

## 2023-01-15 DIAGNOSIS — Z0100 Encounter for examination of eyes and vision without abnormal findings: Secondary | ICD-10-CM

## 2023-01-15 DIAGNOSIS — Z0000 Encounter for general adult medical examination without abnormal findings: Secondary | ICD-10-CM | POA: Diagnosis not present

## 2023-01-15 DIAGNOSIS — J302 Other seasonal allergic rhinitis: Secondary | ICD-10-CM

## 2023-01-15 MED ORDER — FLUTICASONE PROPIONATE 50 MCG/ACT NA SUSP: 2.0000 | Freq: Every day | NASAL | 1 refills | Status: DC

## 2023-01-15 NOTE — Progress Notes (Signed)
.I connected with  Janet [name] on 01/15/23 by a audio enabled telemedicine application and verified that I am speaking with the correct person using two identifiers.  Patient Location: Home  Provider Location: Office/Clinic  I discussed the limitations of evaluation and management by telemedicine. The patient expressed understanding and agreed to proceed.   Subjective:   Janet [name] is a 57 y.o. female who presents for Medicare Annual (Subsequent) preventive examination.  Review of Systems    Defer to PCP  Cardiac Risk Factors include: advanced age (>68mn, >>30women);hypertension;obesity (BMI >30kg/m2)     Objective:    Today's Vitals   01/15/23 1107  PainSc: 0-No pain   There is no height or weight on file to calculate BMI.     01/15/2023   11:20 AM 03/29/2021   10:26 PM  Advanced Directives  Does Patient Have a Medical Advance Directive? No No  Would patient like information on creating a medical advance directive? Yes (Inpatient - patient defers creating a medical advance directive at this time - Information given) No - Patient declined    Current Medications (verified) Outpatient Encounter Medications as of 01/15/2023  Medication Sig   ARIPiprazole (ABILIFY) 10 MG tablet Take 10 mg by mouth daily.   hydrOXYzine (VISTARIL) 50 MG capsule Take 50 mg by mouth 2 (two) times daily.   albuterol (PROVENTIL) (5 MG/ML) 0.5% nebulizer solution Take 0.5 mLs (2.5 mg total) by nebulization every 6 (six) hours as needed for wheezing or shortness of breath.   albuterol (VENTOLIN HFA) 108 (90 Base) MCG/ACT inhaler Inhale 1-2 puffs into the lungs every 6 (six) hours as needed for wheezing or shortness of breath.   aspirin 81 MG chewable tablet Chew 81 mg by mouth daily. aspirin 81 mg chewable tablet   Budeson-Glycopyrrol-Formoterol (BREZTRI AEROSPHERE) 160-9-4.8 MCG/ACT AERO Inhale 2 puffs into the lungs in the morning and at bedtime.   carvedilol (COREG) 12.5 MG tablet Take  1 tablet (12.5 mg total) by mouth 2 (two) times daily.   dapagliflozin propanediol (FARXIGA) 10 MG TABS tablet Take 1 tablet (10 mg total) by mouth daily.   fluticasone (FLONASE) 50 MCG/ACT nasal spray Place 2 sprays into both nostrils daily.   furosemide (LASIX) 20 MG tablet Take 1 tablet (20 mg total) by mouth daily.   montelukast (SINGULAIR) 10 MG tablet TAKE 1 TABLET(10 MG) BY MOUTH AT BEDTIME   Multiple Vitamins-Minerals (ADULT ONE DAILY GUMMIES) CHEW Chew 2 tablets by mouth daily with breakfast.   sacubitril-valsartan (ENTRESTO) 49-51 MG Take 1 tablet by mouth 2 (two) times daily.   spironolactone (ALDACTONE) 25 MG tablet TAKE 1 TABLET(25 MG) BY MOUTH DAILY   traZODone (DESYREL) 150 MG tablet Take 150 mg by mouth at bedtime.   [DISCONTINUED] hydrOXYzine (ATARAX) 50 MG tablet Take 50 mg by mouth 2 (two) times daily.   [DISCONTINUED] ibuprofen (ADVIL) 600 MG tablet Take 600 mg by mouth daily as needed for mild pain.   No facility-administered encounter medications on file as of 01/15/2023.    Allergies (verified) Clarithromycin and Penicillins   History: Past Medical History:  Diagnosis Date   Asthma    CHF (congestive heart failure) (HCC)    Congestive heart failure (CHF) (HCC)    Depression    Elevated brain natriuretic peptide (BNP) level    Hypertension    Lymphedema    SOB (shortness of breath)    Substance abuse ([facility]    Past Surgical History:  Procedure Laterality Date   ABDOMINAL  HYSTERECTOMY     BREAST BIOPSY Right    CESAREAN SECTION     HERNIA REPAIR     umbilical as a child   RIGHT/LEFT HEART CATH AND CORONARY ANGIOGRAPHY N/A 04/01/2021   Procedure: RIGHT/LEFT HEART CATH AND CORONARY ANGIOGRAPHY;  Surgeon: Jettie Booze, MD;  Location: [facility];  Service: Cardiovascular;  Laterality: N/A;   TUBAL LIGATION     Family History  Problem Relation Age of Onset   Colon polyps Mother    Hypertension Mother    Stroke Mother    Deep vein thrombosis  Mother    Congestive Heart Failure Mother    Other Father        history unknown   Obesity Sister    Obesity Brother    Congestive Heart Failure Maternal Grandmother    Colon cancer Neg Hx    Esophageal cancer Neg Hx    Rectal cancer Neg Hx    Stomach cancer Neg Hx    Social History   Socioeconomic History   Marital status: Single    Spouse name: Not on file   Number of children: 2   Years of education: 14   Highest education level: Associate degree: academic program  Occupational History   Occupation: Ship broker   Occupation: disabled  Tobacco Use   Smoking status: Never    Passive exposure: Current   Smokeless tobacco: Never  Vaping Use   Vaping Use: Never used  Substance and Sexual Activity   Alcohol use: Yes    Comment: occasional   Drug use: Yes    Types: Marijuana    Comment: uses marijuana twice a week   Sexual activity: Yes    Birth control/protection: Surgical  Other Topics Concern   Not on file  Social History Narrative   Not on file   Social Determinants of Health   Financial Resource Strain: Not on file  Food Insecurity: Food Insecurity Present (01/15/2023)   Hunger Vital Sign    Worried About Running Out of Food in the Last Year: Sometimes true    Ran Out of Food in the Last Year: Sometimes true  Transportation Needs: Unmet Transportation Needs (01/15/2023)   PRAPARE - Hydrologist (Medical): Yes    Lack of Transportation (Non-Medical): Yes  Physical Activity: Not on file  Stress: Not on file  Social Connections: Not on file    Tobacco Counseling Counseling given: Not Answered   Clinical Intake:  Pre-visit preparation completed: Yes  Pain : 0-10 Pain Score: 0-No pain     Diabetes: No  How often do you need to have someone help you when you read instructions, pamphlets, or other written materials from your doctor or pharmacy?: 1 - Never  Diabetic?No   Interpreter Needed?: No      Activities of Daily  Living    01/15/2023   11:14 AM 01/14/2023   10:44 PM  In your present state of health, do you have any difficulty performing the following activities:  Hearing? 0 0  Vision? 1 1  Difficulty concentrating or making decisions? 0 0  Walking or climbing stairs? 1 1  Dressing or bathing? 0 0  Doing errands, shopping? 1 1  Preparing Food and eating ? N N  Using the Toilet? N N  In the past six months, have you accidently leaked urine? N N  Do you have problems with loss of bowel control? N N  Managing your Medications? N N  Managing your Finances? N N  Housekeeping or managing your Housekeeping? N Y    Patient Care Team: Camillia Herter, NP as PCP - General (Nurse Practitioner) Nahser, Wonda Cheng, MD as PCP - Cardiology (Cardiology) Nahser, Wonda Cheng, MD as Consulting Physician (Cardiology)  Indicate any recent Medical Services you may have received from other than [facility] providers in the past year (date may be approximate).     Assessment:   This is a routine wellness examination for Janet [name].  Hearing/Vision screen No results found.  Dietary issues and exercise activities discussed: Current Exercise Habits: The patient does not participate in regular exercise at present, Exercise limited by: cardiac condition(s)   Goals Addressed   None   Depression Screen    01/15/2023   11:20 AM 08/02/2022    2:19 PM 02/28/2022    1:45 PM 03/21/2021   11:13 AM 01/12/2021   10:27 AM 12/22/2020    1:42 PM  PHQ 2/9 Scores  PHQ - 2 Score 0 0 0 0 3 0  PHQ- 9 Score    0 14     Fall Risk    01/15/2023   11:20 AM 01/14/2023   10:44 PM 02/28/2022   10:25 AM 03/21/2021   11:13 AM 12/22/2020    1:42 PM  Fall Risk   Falls in the past year? 0 0 0 0 0  Number falls in past yr: 0 0  0 0  Injury with Fall? 0 0  0 0  Risk for fall due to : No Fall Risks  Other (Comment) No Fall Risks No Fall Risks  Risk for fall due to: Comment   Single leg stand less than 10 seconds    Follow up Falls evaluation completed   Falls evaluation completed Falls evaluation completed Falls evaluation completed    R[place]:  Any stairs in or around the home? Yes  If so, are there any without handrails? No  Home free of loose throw rugs in walkways, pet beds, electrical cords, etc? Yes  Adequate lighting in your home to reduce risk of falls? Yes   ASSISTIVE DEVICES UTILIZED TO PREVENT FALLS:  Life alert? No  Use of a cane, walker or w/c? No  Grab bars in the bathroom? No  Shower chair or bench in shower? No  Elevated toilet seat or a handicapped toilet? No   TIMED UP AND GO:  Was the test performed? No .  Length of time to ambulate 10 feet: N/A sec.    Cognitive Function:        01/15/2023   11:16 AM  6CIT Screen  What Year? 0 points  What month? 0 points  What time? 0 points  Count back from 20 0 points  Months in reverse 0 points  Repeat phrase 0 points  Total Score 0 points    Immunizations Immunization History  Administered Date(s) Administered   PFIZER(Purple Top)SARS-COV-2 Vaccination 07/05/2020, 07/29/2020    TDAP status: Due, Education has been provided regarding the importance of this vaccine. Advised may receive this vaccine at local pharmacy or Health Dept. Aware to provide a copy of the vaccination record if obtained from local pharmacy or Health Dept. Verbalized acceptance and understanding.  Flu Vaccine status: Due, Education has been provided regarding the importance of this vaccine. Advised may receive this vaccine at local pharmacy or Health Dept. Aware to provide a copy of the vaccination record if obtained from local pharmacy or Health Dept. Verbalized  acceptance and understanding.  Pneumococcal vaccine status: Due, Education has been provided regarding the importance of this vaccine. Advised may receive this vaccine at local pharmacy or Health Dept. Aware to provide a copy of the vaccination record if obtained from local pharmacy or Health  Dept. Verbalized acceptance and understanding.  Covid-19 vaccine status: Completed vaccines  Qualifies for Shingles Vaccine? Yes   Zostavax completed No   Shingrix Completed?: No.    Education has been provided regarding the importance of this vaccine. Patient has been advised to call insurance company to determine out of pocket expense if they have not yet received this vaccine. Advised may also receive vaccine at local pharmacy or Health Dept. Verbalized acceptance and understanding.  Screening Tests Health Maintenance  Topic Date Due   DTaP/Tdap/Td (1 - Tdap) Never done   PAP SMEAR-Modifier  Never done   COLONOSCOPY (Pts 45-98yr Insurance coverage will need to be confirmed)  Never done   COVID-19 Vaccine (3 - Pfizer risk series) 08/26/2020   INFLUENZA VACCINE  02/18/2023 (Originally 06/20/2022)   Zoster Vaccines- Shingrix (1 of 2) 04/15/2023 (Originally 08/01/1986)   Medicare Annual Wellness (AWV)  01/16/2024   MAMMOGRAM  03/07/2024   Hepatitis C Screening  Completed   HIV Screening  Completed   HPV VACCINES  Aged Out    Health Maintenance  Health Maintenance Due  Topic Date Due   DTaP/Tdap/Td (1 - Tdap) Never done   PAP SMEAR-Modifier  Never done   COLONOSCOPY (Pts 45-454yrInsurance coverage will need to be confirmed)  Never done   COVID-19 Vaccine (3 - PfOconeeisk series) 08/26/2020    Colorectal cancer screening: Referral to GI placed Scheduled for 02/26/23. Pt aware the office will call re: appt.  Mammogram status: Completed 05/13/23. Repeat every year  Bone Density status:  Pt does not meet age criteria for procedure.   Lung Cancer Screening: (Low Dose CT Chest recommended if Age 362-80ears, 30 pack-year currently smoking OR have quit w/in 15years.) does not qualify.   Lung Cancer Screening Referral: N/A  Additional Screening:  Hepatitis C Screening: does qualify; Completed 01/12/2021  Vision Screening: Recommended annual ophthalmology exams for early detection  of glaucoma and other disorders of the eye. Is the patient up to date with their annual eye exam?  No  Who is the provider or what is the name of the office in which the patient attends annual eye exams? N/A If pt is not established with a provider, would they like to be referred to a provider to establish care? Yes .   Dental Screening: Recommended annual dental exams for proper oral hygiene  Community Resource Referral / Chronic Care Management: CRR required this visit?  Yes  Food pantry resources mailed to patient.   CCM required this visit?  No      Plan:     I have personally reviewed and noted the following in the patient's chart:   Medical and social history Use of alcohol, tobacco or illicit drugs  Current medications and supplements including opioid prescriptions. Patient is not currently taking opioid prescriptions. Functional ability and status Nutritional status Physical activity Advanced directives List of other physicians Hospitalizations, surgeries, and ER visits in previous 12 months Vitals Screenings to include cognitive, depression, and falls Referrals and appointments  In addition, I have reviewed and discussed with patient certain preventive protocols, quality metrics, and best practice recommendations. A written personalized care plan for preventive services as well as general preventive health recommendations were provided  to patient.     Elmon Else, [place]   01/15/2023   Nurse Notes: .Non-Face to face 40 minute visit   . Janet [name] , Thank you for taking time to come for your Medicare Wellness Visit. I appreciate your ongoing commitment to your health goals. Please review the following plan we discussed and let me know if I can assist you in the future.   These are the goals we discussed:  Goals   None     This is a list of the screening recommended for you and due dates:  Health Maintenance  Topic Date Due   DTaP/Tdap/Td vaccine (1 -  Tdap) Never done   Pap Smear  Never done   Colon Cancer Screening  Never done   COVID-19 Vaccine (3 - Pfizer risk series) 08/26/2020   Flu Shot  02/18/2023*   Zoster (Shingles) Vaccine (1 of 2) 04/15/2023*   Medicare Annual Wellness Visit  01/16/2024   Mammogram  03/07/2024   Hepatitis C Screening: USPSTF Recommendation to screen - Ages 18-79 yo.  Completed   HIV Screening  Completed   HPV Vaccine  Aged Out  *Topic was postponed. The date shown is not the original due date.

## 2023-01-16 ENCOUNTER — Emergency Department (HOSPITAL_COMMUNITY)
Admission: EM | Admit: 2023-01-16 | Discharge: 2023-01-16 | Disposition: A | Discharge status: Home / Self Care | Payer: 59 | Attending: Emergency Medicine | Admitting: Emergency Medicine

## 2023-01-16 ENCOUNTER — Emergency Department (HOSPITAL_COMMUNITY): Payer: 59

## 2023-01-16 DIAGNOSIS — R062 Wheezing: Secondary | ICD-10-CM

## 2023-01-16 DIAGNOSIS — R0602 Shortness of breath: Secondary | ICD-10-CM | POA: Diagnosis not present

## 2023-01-16 DIAGNOSIS — Z7982 Long term (current) use of aspirin: Secondary | ICD-10-CM | POA: Insufficient documentation

## 2023-01-16 DIAGNOSIS — J4541 Moderate persistent asthma with (acute) exacerbation: Secondary | ICD-10-CM | POA: Diagnosis not present

## 2023-01-16 DIAGNOSIS — F17200 Nicotine dependence, unspecified, uncomplicated: Secondary | ICD-10-CM | POA: Insufficient documentation

## 2023-01-16 DIAGNOSIS — Z7951 Long term (current) use of inhaled steroids: Secondary | ICD-10-CM | POA: Diagnosis not present

## 2023-01-16 DIAGNOSIS — Z7952 Long term (current) use of systemic steroids: Secondary | ICD-10-CM | POA: Insufficient documentation

## 2023-01-16 DIAGNOSIS — Z20822 Contact with and (suspected) exposure to covid-19: Secondary | ICD-10-CM | POA: Insufficient documentation

## 2023-01-16 DIAGNOSIS — R079 Chest pain, unspecified: Secondary | ICD-10-CM | POA: Diagnosis not present

## 2023-01-16 LAB — BASIC METABOLIC PANEL
Anion gap: 10 (ref 5–15)
BUN: 12 mg/dL (ref 6–20)
CO2: 25 mmol/L (ref 22–32)
Calcium: 9.1 mg/dL (ref 8.9–10.3)
Chloride: 104 mmol/L (ref 98–111)
Creatinine, Ser: 1.3 mg/dL — ABNORMAL HIGH (ref 0.44–1.00)
GFR, Estimated: 49 mL/min — ABNORMAL LOW (ref >60)
Glucose, Bld: 109 mg/dL — ABNORMAL HIGH (ref 70–99)
Potassium: 4 mmol/L (ref 3.5–5.1)
Sodium: 139 mmol/L (ref 135–145)

## 2023-01-16 LAB — RESP PANEL BY RT-PCR (RSV, FLU A&B, COVID)  RVPGX2
Influenza A by PCR: NEGATIVE
Influenza B by PCR: NEGATIVE
Resp Syncytial Virus by PCR: NEGATIVE
SARS Coronavirus 2 by RT PCR: NEGATIVE

## 2023-01-16 LAB — CBC
HCT: 45.1 % (ref 36.0–46.0)
Hemoglobin: 14.7 g/dL (ref 12.0–15.0)
MCH: 27.7 pg (ref 26.0–34.0)
MCHC: 32.6 g/dL (ref 30.0–36.0)
MCV: 84.9 fL (ref 80.0–100.0)
Platelets: 230 10*3/uL (ref 150–400)
RBC: 5.31 MIL/uL — ABNORMAL HIGH (ref 3.87–5.11)
RDW: 15.2 % (ref 11.5–15.5)
WBC: 7 10*3/uL (ref 4.0–10.5)
nRBC: 0.3 % — ABNORMAL HIGH (ref 0.0–0.2)

## 2023-01-16 LAB — TROPONIN I (HIGH SENSITIVITY)
Troponin I (High Sensitivity): 15 ng/L (ref <18)
Troponin I (High Sensitivity): 16 ng/L (ref <18)

## 2023-01-16 LAB — BRAIN NATRIURETIC PEPTIDE: B Natriuretic Peptide: 119.7 pg/mL — ABNORMAL HIGH (ref 0.0–100.0)

## 2023-01-16 MED ORDER — PREDNISONE 20 MG PO TABS: 60.0000 mg | ORAL_TABLET | Freq: Every day | ORAL | 0 refills | Status: DC

## 2023-01-16 MED ORDER — ALBUTEROL SULFATE (2.5 MG/3ML) 0.083% IN NEBU
5.0000 mg | INHALATION_SOLUTION | Freq: Once | RESPIRATORY_TRACT | Status: AC
  Administered 2023-01-16: 5 mg via RESPIRATORY_TRACT
  Filled 2023-01-16: qty 6

## 2023-01-16 MED ORDER — IPRATROPIUM BROMIDE 0.02 % IN SOLN
0.5000 mg | Freq: Once | RESPIRATORY_TRACT | Status: AC
  Administered 2023-01-16: 0.5 mg via RESPIRATORY_TRACT
  Filled 2023-01-16: qty 2.5

## 2023-01-16 MED ORDER — ALBUTEROL SULFATE HFA 108 (90 BASE) MCG/ACT IN AERS: 2.0000 | INHALATION_SPRAY | RESPIRATORY_TRACT | 1 refills | Status: DC | PRN

## 2023-01-16 MED ORDER — ALBUTEROL SULFATE (2.5 MG/3ML) 0.083% IN NEBU: 2.5000 mg | INHALATION_SOLUTION | RESPIRATORY_TRACT | 1 refills | Status: DC | PRN

## 2023-01-16 MED ORDER — METHYLPREDNISOLONE SODIUM SUCC 125 MG IJ SOLR
125.0000 mg | Freq: Once | INTRAMUSCULAR | Status: AC
  Administered 2023-01-16: 125 mg via INTRAVENOUS
  Filled 2023-01-16: qty 2

## 2023-01-16 NOTE — Discharge Instructions (Signed)
It was our pleasure to provide your ER care today - we hope that you feel better.  Take prednisone as prescribed. Use albuterol treatment every 3-4 hours as need.  Avoid any smoking or dusty/smoky environments.   Follow up with primary care doctor in one week if symptoms fail to improve/resolve.  Return to ER if worse, new symptoms, high fevers, chest pain, increased trouble breathing, or other concern.

## 2023-01-16 NOTE — ED Triage Notes (Signed)
Pt from home for eval of sob and L sided chest pain, both worse with exertion or lying flat. CP also worse when she coughs. Hx CHF. Missed a dose or two of her diuretic last week but none over the last couple of days.

## 2023-01-16 NOTE — ED Provider Notes (Signed)
[facility] Provider Note   CSN: [id number] Arrival date & time: 01/16/23  [id number]     History  Chief Complaint  Patient presents with   Shortness of Breath   Chest Pain    Janet [name] is a 57 y.o. adult.  Pt with hx asthma, c/o sob and wheezing in the past couple days. Has noted non prod cough, congestion. W cough notes transient chest pain - no other exertional chest pain, no constant or pleuritic chest pain. No sore throat or trouble swallowing. Denies leg pain or swelling. No hx cad or fam hx premature cad. No hx dvt or pe. Uses albuterol tx prn, and has been using at home but wheezing has persisted. Occasional smoking.   The history is provided by the patient and medical records.  Shortness of Breath Associated symptoms: chest pain, cough and wheezing   Associated symptoms: no abdominal pain, no fever, no headaches, no neck pain, no rash, no sore throat and no vomiting   Chest Pain Associated symptoms: cough and shortness of breath   Associated symptoms: no abdominal pain, no back pain, no fever, no headache, no palpitations and no vomiting        Home Medications Prior to Admission medications   Medication Sig Start Date End Date Taking? Authorizing Provider  albuterol (PROVENTIL) (2.5 MG/3ML) 0.083% nebulizer solution Take 3 mLs (2.5 mg total) by nebulization every 4 (four) hours as needed for wheezing or shortness of breath. 01/16/23  Yes Lajean Saver, MD  albuterol (VENTOLIN HFA) 108 (90 Base) MCG/ACT inhaler Inhale 2 puffs into the lungs every 4 (four) hours as needed for wheezing or shortness of breath. 01/16/23  Yes Lajean Saver, MD  predniSONE (DELTASONE) 20 MG tablet Take 3 tablets (60 mg total) by mouth daily. 01/17/23  Yes Lajean Saver, MD  albuterol (PROVENTIL) (5 MG/ML) 0.5% nebulizer solution Take 0.5 mLs (2.5 mg total) by nebulization every 6 (six) hours as needed for wheezing or shortness of breath. 12/17/22    Melynda Ripple, NP  albuterol (VENTOLIN HFA) 108 (90 Base) MCG/ACT inhaler Inhale 1-2 puffs into the lungs every 6 (six) hours as needed for wheezing or shortness of breath. 12/27/22   Camillia Herter, NP  ARIPiprazole (ABILIFY) 10 MG tablet Take 10 mg by mouth daily. 01/04/23   [provider]  aspirin 81 MG chewable tablet Chew 81 mg by mouth daily. aspirin 81 mg chewable tablet    [provider]  Budeson-Glycopyrrol-Formoterol (BREZTRI AEROSPHERE) 160-9-4.8 MCG/ACT AERO Inhale 2 puffs into the lungs in the morning and at bedtime. 06/08/21   Hunsucker, Bonna Gains, MD  carvedilol (COREG) 12.5 MG tablet Take 1 tablet (12.5 mg total) by mouth 2 (two) times daily. 10/27/22   Sabharwal, Aditya, DO  dapagliflozin propanediol (FARXIGA) 10 MG TABS tablet Take 1 tablet (10 mg total) by mouth daily. 12/26/21   Nahser, Wonda Cheng, MD  fluticasone (FLONASE) 50 MCG/ACT nasal spray Place 2 sprays into both nostrils daily. 01/15/23   Camillia Herter, NP  furosemide (LASIX) 20 MG tablet Take 1 tablet (20 mg total) by mouth daily. 09/01/22   Sabharwal, Aditya, DO  hydrOXYzine (VISTARIL) 50 MG capsule Take 50 mg by mouth 2 (two) times daily. 01/02/23   [provider]  montelukast (SINGULAIR) 10 MG tablet TAKE 1 TABLET(10 MG) BY MOUTH AT BEDTIME 01/02/23   Camillia Herter, NP  Multiple Vitamins-Minerals (ADULT ONE DAILY GUMMIES) CHEW Chew 2 tablets by mouth  daily with breakfast.    [provider]  sacubitril-valsartan (ENTRESTO) 49-51 MG Take 1 tablet by mouth 2 (two) times daily. 09/01/22   Sabharwal, Aditya, DO  spironolactone (ALDACTONE) 25 MG tablet TAKE 1 TABLET(25 MG) BY MOUTH DAILY 02/27/22   Nahser, Wonda Cheng, MD  traZODone (DESYREL) 150 MG tablet Take 150 mg by mouth at bedtime.    [provider]      Allergies    Clarithromycin and Penicillins    Review of Systems   Review of Systems  Constitutional:  Negative for chills and fever.  HENT:  Positive for congestion and  rhinorrhea. Negative for sore throat.   Eyes:  Negative for redness.  Respiratory:  Positive for cough, shortness of breath and wheezing.   Cardiovascular:  Positive for chest pain. Negative for palpitations and leg swelling.  Gastrointestinal:  Negative for abdominal pain and vomiting.  Genitourinary:  Negative for dysuria and flank pain.  Musculoskeletal:  Negative for back pain and neck pain.  Skin:  Negative for rash.  Neurological:  Negative for headaches.  Hematological:  Does not bruise/bleed easily.  Psychiatric/Behavioral:  Negative for confusion.     Physical Exam Updated Vital Signs BP 125/86 (BP Location: Right Arm)   Pulse 70   Temp 98.1 F (36.7 C) (Oral)   Resp 15   SpO2 100%  Physical Exam Vitals and nursing note reviewed.  Constitutional:      Appearance: Normal appearance. She is well-developed.  HENT:     Head: Atraumatic.     Nose: Congestion present.     Mouth/Throat:     Mouth: Mucous membranes are moist.     Pharynx: Oropharynx is clear. No oropharyngeal exudate or posterior oropharyngeal erythema.  Eyes:     General: No scleral icterus.    Conjunctiva/sclera: Conjunctivae normal.     Pupils: Pupils are equal, round, and reactive to light.  Neck:     Trachea: No tracheal deviation.     Comments: No stiffness or rigidity.  Cardiovascular:     Rate and Rhythm: Normal rate and regular rhythm.     Pulses: Normal pulses.     Heart sounds: Normal heart sounds. No murmur heard.    No friction rub. No gallop.  Pulmonary:     Effort: Pulmonary effort is normal. No accessory muscle usage or respiratory distress.     Breath sounds: Wheezing present.     Comments: Diffuse wheezing bil.  Abdominal:     General: Bowel sounds are normal. There is no distension.     Palpations: Abdomen is soft.     Tenderness: There is no abdominal tenderness.  Genitourinary:    Comments: No cva tenderness. Musculoskeletal:        General: No swelling or tenderness.      Cervical back: Normal range of motion and neck supple. No rigidity.     Right lower leg: No edema.     Left lower leg: No edema.  Lymphadenopathy:     Cervical: No cervical adenopathy.  Skin:    General: Skin is warm and dry.     Findings: No rash.  Neurological:     Mental Status: She is alert.     Comments: Alert, speech clear.   Psychiatric:        Mood and Affect: Mood normal.     ED Results / Procedures / Treatments   Labs (all labs ordered are listed, but only abnormal results are displayed) Results for orders placed or  performed during the hospital encounter of 01/16/23  Resp panel by RT-PCR (RSV, Flu A&B, Covid) Anterior Nasal Swab   Specimen: Anterior Nasal Swab  Result Value Ref Range   SARS Coronavirus 2 by RT PCR NEGATIVE NEGATIVE   Influenza A by PCR NEGATIVE NEGATIVE   Influenza B by PCR NEGATIVE NEGATIVE   Resp Syncytial Virus by PCR NEGATIVE NEGATIVE  Basic metabolic panel  Result Value Ref Range   Sodium 139 135 - 145 mmol/L   Potassium 4.0 3.5 - 5.1 mmol/L   Chloride 104 98 - 111 mmol/L   CO2 25 22 - 32 mmol/L   Glucose, Bld 109 (H) 70 - 99 mg/dL   BUN 12 6 - 20 mg/dL   Creatinine, Ser 1.30 (H) 0.44 - 1.00 mg/dL   Calcium 9.1 8.9 - 10.3 mg/dL   GFR, Estimated 49 (L) >60 mL/min   Anion gap 10 5 - 15  CBC  Result Value Ref Range   WBC 7.0 4.0 - 10.5 K/uL   RBC 5.31 (H) 3.87 - 5.11 MIL/uL   Hemoglobin 14.7 12.0 - 15.0 g/dL   HCT 45.1 36.0 - 46.0 %   MCV 84.9 80.0 - 100.0 fL   MCH 27.7 26.0 - 34.0 pg   MCHC 32.6 30.0 - 36.0 g/dL   RDW 15.2 11.5 - 15.5 %   Platelets 230 150 - 400 K/uL   nRBC 0.3 (H) 0.0 - 0.2 %  Brain natriuretic peptide  Result Value Ref Range   B Natriuretic Peptide 119.7 (H) 0.0 - 100.0 pg/mL  Troponin I (High Sensitivity)  Result Value Ref Range   Troponin I (High Sensitivity) 15 <18 ng/L  Troponin I (High Sensitivity)  Result Value Ref Range   Troponin I (High Sensitivity) 16 <18 ng/L   DG Chest 2 View  Result Date:  01/16/2023 CLINICAL DATA:  Chest pain and shortness of breath EXAM: CHEST - 2 VIEW COMPARISON:  03/29/2021 FINDINGS: Normal heart size. Aortic tortuosity. No acute infiltrate or edema. No effusion or pneumothorax. No acute osseous findings. Spondylosis. IMPRESSION: No active cardiopulmonary disease. Electronically Signed   By: Jorje Guild M.D.   On: 01/16/2023 07:49     EKG EKG Interpretation  Date/Time:  Tuesday January 16 2023 07:23:43 EST Ventricular Rate:  71 PR Interval:  154 QRS Duration: 88 QT Interval:  422 QTC Calculation: 458 R Axis:   -22 Text Interpretation: Normal sinus rhythm Nonspecific T wave abnormality No significant change since last tracing Confirmed by Lajean Saver [phone]) on 01/16/2023 8:32:42 AM  Radiology DG Chest 2 View  Result Date: 01/16/2023 CLINICAL DATA:  Chest pain and shortness of breath EXAM: CHEST - 2 VIEW COMPARISON:  03/29/2021 FINDINGS: Normal heart size. Aortic tortuosity. No acute infiltrate or edema. No effusion or pneumothorax. No acute osseous findings. Spondylosis. IMPRESSION: No active cardiopulmonary disease. Electronically Signed   By: Jorje Guild M.D.   On: 01/16/2023 07:49    Procedures Procedures    Medications Ordered in ED Medications  methylPREDNISolone sodium succinate (SOLU-MEDROL) 125 mg/2 mL injection 125 mg (125 mg Intravenous Given 01/16/23 1002)  albuterol (PROVENTIL) (2.5 MG/3ML) 0.083% nebulizer solution 5 mg (5 mg Nebulization Given 01/16/23 0943)  ipratropium (ATROVENT) nebulizer solution 0.5 mg (0.5 mg Nebulization Given 01/16/23 1007)  albuterol (PROVENTIL) (2.5 MG/3ML) 0.083% nebulizer solution 5 mg (5 mg Nebulization Given 01/16/23 1111)  ipratropium (ATROVENT) nebulizer solution 0.5 mg (0.5 mg Nebulization Given 01/16/23 1144)  albuterol (PROVENTIL) (2.5 MG/3ML) 0.083% nebulizer solution 5 mg (5  mg Nebulization Given 01/16/23 1144)    ED Course/ Medical Decision Making/ A&P                             Medical  Decision Making Problems Addressed: Moderate persistent asthma with acute exacerbation: acute illness or injury with systemic symptoms that poses a threat to life or bodily functions Wheezing: acute illness or injury with systemic symptoms that poses a threat to life or bodily functions  Amount and/or Complexity of Data Reviewed External Data Reviewed: radiology and notes. Labs: ordered. Decision-making details documented in ED Course. Radiology: ordered and independent interpretation performed. Decision-making details documented in ED Course. ECG/medicine tests: ordered and independent interpretation performed. Decision-making details documented in ED Course.  Risk Prescription drug management. Decision regarding hospitalization.   Iv ns. Continuous pulse ox and cardiac monitoring. Labs ordered/sent. Imaging ordered.   Differential diagnosis includes asthma exacerbation, uri/viral syndrome, pna, etc . Dispo decision including potential need for admission considered - will get labs and imaging and reassess.   Reviewed nursing notes and prior charts for additional history. External reports reviewed. Prior cardiac cath neg for cad, prior calcium score 0, prior cta chest neg for PE.   Hx asthma, feels similar.   Albuterol and atrovent tx. Solumedrol iv.   Cardiac monitor: sinus rhythm, rate 70.  Labs reviewed/interpreted by me - wbc and hgb normal. Trop normal.   Xrays reviewed/interpreted by me - no pna.   Wheezing persists but improving. Good air movement. Additional albuterol tx.   Pt is breathing comfortably, o2 sats 100%.    Shared decision making w pt, offered/discussed admission.  Pt indicates feeling much improved and feels ready for d/c, does not feel wants to stay or be admitted.   Pt is breathing comfortably and currently appears stable for d/c per her request.   Rec close pcp f/u.  Return precautions provided.          Final Clinical Impression(s) / ED  Diagnoses Final diagnoses:  Moderate persistent asthma with acute exacerbation  Wheezing    Rx / DC Orders ED Discharge Orders          Ordered    predniSONE (DELTASONE) 20 MG tablet  Daily        01/16/23 1223    albuterol (VENTOLIN HFA) 108 (90 Base) MCG/ACT inhaler  Every 4 hours PRN        01/16/23 1223    albuterol (PROVENTIL) (2.5 MG/3ML) 0.083% nebulizer solution  Every 4 hours PRN        01/16/23 1223              Lajean Saver, MD 01/16/23 1224

## 2023-01-17 NOTE — Progress Notes (Addendum)
Patient ID: Janet [name], adult    DOB: [birth year]  MRN: [id number]  CC: Emergency Department Follow-Up  Subjective: Janet [name] is a 57 y.o. adult who presents for emergency department follow-up.   Her concerns today include:  01/16/2023 [facility] Emergency Department at [facility] per MD note:                       Medical Decision Making Problems Addressed: Moderate persistent asthma with acute exacerbation: acute illness or injury with systemic symptoms that poses a threat to life or bodily functions Wheezing: acute illness or injury with systemic symptoms that poses a threat to life or bodily functions   Amount and/or Complexity of Data Reviewed External Data Reviewed: radiology and notes. Labs: ordered. Decision-making details documented in ED Course. Radiology: ordered and independent interpretation performed. Decision-making details documented in ED Course. ECG/medicine tests: ordered and independent interpretation performed. Decision-making details documented in ED Course.   Risk Prescription drug management. Decision regarding hospitalization.     Iv ns. Continuous pulse ox and cardiac monitoring. Labs ordered/sent. Imaging ordered.    Differential diagnosis includes asthma exacerbation, uri/viral syndrome, pna, etc . Dispo decision including potential need for admission considered - will get labs and imaging and reassess.    Reviewed nursing notes and prior charts for additional history. External reports reviewed. Prior cardiac cath neg for cad, prior calcium score 0, prior cta chest neg for PE.    Hx asthma, feels similar.    Albuterol and atrovent tx. Solumedrol iv.    Cardiac monitor: sinus rhythm, rate 70.   Labs reviewed/interpreted by me - wbc and hgb normal. Trop normal.    Xrays reviewed/interpreted by me - no pna.    Wheezing persists but improving. Good air movement. Additional albuterol tx.    Pt is breathing comfortably, o2  sats 100%.     Shared decision making w pt, offered/discussed admission.  Pt indicates feeling much improved and feels ready for d/c, does not feel wants to stay or be admitted.    Pt is breathing comfortably and currently appears stable for d/c per her request.    Rec close pcp f/u.   Return precautions provided.   Today's visit 01/22/2023: - Reports doing ok since Urgent Care visit. She denies issues with breathing. She denies red flag symptoms associated with breathing. She is taking her medications as prescribed. Still having some phlegm. States she hasn't been able to follow-up with Pulmonology for asthma management due to her busy schedule. - Lipoma of right upper extremity getting larger and causing discomfort when completing activities of daily living. She denies additional symptoms.  - States she has a colonoscopy scheduled in April 2024.  - Established with Cardiology.  - No further issues/concerns for discussion today.  Patient Active Problem List   Diagnosis Date Noted   Prediabetes 123456   Acute systolic heart failure (HCC)    Essential hypertension 02/17/2021   Asthma    Lymphedema    Elevated brain natriuretic peptide (BNP) level    DOE (dyspnea on exertion) 07/28/2020   Morbid obesity ([place]) 07/28/2020     Current Outpatient Medications on File Prior to Visit  Medication Sig Dispense Refill   albuterol (PROVENTIL) (2.5 MG/3ML) 0.083% nebulizer solution Take 3 mLs (2.5 mg total) by nebulization every 4 (four) hours as needed for wheezing or shortness of breath. 75 mL 1   albuterol (PROVENTIL) (5 MG/ML) 0.5% nebulizer solution Take 0.5  mLs (2.5 mg total) by nebulization every 6 (six) hours as needed for wheezing or shortness of breath. 20 mL 0   albuterol (VENTOLIN HFA) 108 (90 Base) MCG/ACT inhaler Inhale 1-2 puffs into the lungs every 6 (six) hours as needed for wheezing or shortness of breath. 1 each 1   albuterol (VENTOLIN HFA) 108 (90 Base) MCG/ACT inhaler  Inhale 2 puffs into the lungs every 4 (four) hours as needed for wheezing or shortness of breath. 1 each 1   ARIPiprazole (ABILIFY) 10 MG tablet Take 10 mg by mouth daily.     aspirin 81 MG chewable tablet Chew 81 mg by mouth daily. aspirin 81 mg chewable tablet     Budeson-Glycopyrrol-Formoterol (BREZTRI AEROSPHERE) 160-9-4.8 MCG/ACT AERO Inhale 2 puffs into the lungs in the morning and at bedtime. 10.7 g 5   carvedilol (COREG) 12.5 MG tablet Take 1 tablet (12.5 mg total) by mouth 2 (two) times daily. 180 tablet 3   dapagliflozin propanediol (FARXIGA) 10 MG TABS tablet Take 1 tablet (10 mg total) by mouth daily. 30 tablet 5   fluticasone (FLONASE) 50 MCG/ACT nasal spray Place 2 sprays into both nostrils daily. 48 g 0   furosemide (LASIX) 20 MG tablet Take 1 tablet (20 mg total) by mouth daily. 30 tablet 5   hydrOXYzine (VISTARIL) 50 MG capsule Take 50 mg by mouth 2 (two) times daily.     montelukast (SINGULAIR) 10 MG tablet TAKE 1 TABLET(10 MG) BY MOUTH AT BEDTIME 30 tablet 2   Multiple Vitamins-Minerals (ADULT ONE DAILY GUMMIES) CHEW Chew 2 tablets by mouth daily with breakfast.     predniSONE (DELTASONE) 20 MG tablet Take 3 tablets (60 mg total) by mouth daily. 12 tablet 0   sacubitril-valsartan (ENTRESTO) 49-51 MG Take 1 tablet by mouth 2 (two) times daily. 60 tablet 3   spironolactone (ALDACTONE) 25 MG tablet TAKE 1 TABLET(25 MG) BY MOUTH DAILY 90 tablet 3   traZODone (DESYREL) 150 MG tablet Take 150 mg by mouth at bedtime.     No current facility-administered medications on file prior to visit.    Allergies  Allergen Reactions   Clarithromycin Other (See Comments)    GI upset   Penicillins Rash, Hives and Other (See Comments)    Social History   Socioeconomic History   Marital status: Single    Spouse name: Not on file   Number of children: 2   Years of education: 14   Highest education level: Associate degree: academic program  Occupational History   Occupation: Ship broker    Occupation: disabled  Tobacco Use   Smoking status: Never    Passive exposure: Current   Smokeless tobacco: Never  Vaping Use   Vaping Use: Never used  Substance and Sexual Activity   Alcohol use: Yes    Comment: occasional   Drug use: Yes    Types: Marijuana    Comment: uses marijuana twice a week   Sexual activity: Yes    Birth control/protection: Surgical  Other Topics Concern   Not on file  Social History Narrative   Not on file   Social Determinants of Health   Financial Resource Strain: Not on file  Food Insecurity: Food Insecurity Present (01/15/2023)   Hunger Vital Sign    Worried About Running Out of Food in the Last Year: Sometimes true    Ran Out of Food in the Last Year: Sometimes true  Transportation Needs: Unmet Transportation Needs (01/15/2023)   PRAPARE - Transportation  Lack of Transportation (Medical): Yes    Lack of Transportation (Non-Medical): Yes  Physical Activity: Not on file  Stress: Not on file  Social Connections: Not on file  Intimate Partner Violence: Not At Risk (01/15/2023)   Humiliation, Afraid, Rape, and Kick questionnaire    Fear of Current or Ex-Partner: No    Emotionally Abused: No    Physically Abused: No    Sexually Abused: No    Family History  Problem Relation Age of Onset   Colon polyps Mother    Hypertension Mother    Stroke Mother    Deep vein thrombosis Mother    Congestive Heart Failure Mother    Other Father        history unknown   Obesity Sister    Obesity Brother    Congestive Heart Failure Maternal Grandmother    Colon cancer Neg Hx    Esophageal cancer Neg Hx    Rectal cancer Neg Hx    Stomach cancer Neg Hx     Past Surgical History:  Procedure Laterality Date   ABDOMINAL HYSTERECTOMY     BREAST BIOPSY Right    CESAREAN SECTION     HERNIA REPAIR     umbilical as a child   RIGHT/LEFT HEART CATH AND CORONARY ANGIOGRAPHY N/A 04/01/2021   Procedure: RIGHT/LEFT HEART CATH AND CORONARY ANGIOGRAPHY;   Surgeon: Jettie Booze, MD;  Location: [facility];  Service: Cardiovascular;  Laterality: N/A;   TUBAL LIGATION      ROS: Review of Systems Negative except as stated above  PHYSICAL EXAM: BP 115/84 (BP Location: Left Arm, Patient Position: Sitting, Cuff Size: Large)   Pulse 65   Temp 98.3 F (36.8 C)   Resp 16   Ht 5' 7.99" (1.727 m)   Wt 278 lb (126.1 kg)   SpO2 95%   BMI 42.28 kg/m   Physical Exam HENT:     Head: Normocephalic and atraumatic.     Right Ear: Tympanic membrane, ear canal and external ear normal.     Left Ear: Tympanic membrane, ear canal and external ear normal.     Nose: Nose normal.     Mouth/Throat:     Mouth: Mucous membranes are moist.     Pharynx: Oropharynx is clear.  Eyes:     Extraocular Movements: Extraocular movements intact.     Conjunctiva/sclera: Conjunctivae normal.     Pupils: Pupils are equal, round, and reactive to light.  Cardiovascular:     Rate and Rhythm: Normal rate and regular rhythm.     Pulses: Normal pulses.     Heart sounds: Normal heart sounds.  Pulmonary:     Effort: Pulmonary effort is normal.     Breath sounds: Normal breath sounds.  Chest:     Comments: Patient declined.  Abdominal:     General: Bowel sounds are normal.     Palpations: Abdomen is soft.  Genitourinary:    General: Normal vulva.     Vagina: Normal.     Cervix: Normal.     Uterus: Normal.      Adnexa: Right adnexa normal and left adnexa normal.     Comments: Elmon Else, CMA present. Musculoskeletal:        General: Normal range of motion.     Right shoulder: Normal.     Left shoulder: Normal.     Right upper arm: Normal.     Left upper arm: Normal.     Right elbow: Normal.  Left elbow: Normal.     Right forearm: Normal.     Left forearm: Normal.     Right wrist: Normal.     Left wrist: Normal.     Right hand: Normal.     Left hand: Normal.     Cervical back: Normal, normal range of motion and neck supple.      Thoracic back: Normal.     Lumbar back: Normal.     Right hip: Normal.     Left hip: Normal.     Right upper leg: Normal.     Left upper leg: Normal.     Right knee: Normal.     Left knee: Normal.     Right lower leg: Normal.     Left lower leg: Normal.     Right ankle: Normal.     Left ankle: Normal.  Feet:     Comments: +1 edema bilateral feet and ankles.  Skin:    General: Skin is warm and dry.     Capillary Refill: Capillary refill takes less than 2 seconds.  Neurological:     General: No focal deficit present.     Mental Status: She is alert and oriented to person, place, and time.  Psychiatric:        Mood and Affect: Mood normal.        Behavior: Behavior normal.     ASSESSMENT AND PLAN: 1. Annual physical exam - Counseled on 150 minutes of exercise per week as tolerated, healthy eating (including decreased daily intake of saturated fats, cholesterol, added sugars, sodium), STI prevention, and routine healthcare maintenance.  2. Screening for metabolic disorder - Routine screening.  - Hepatic Function Panel  3. Screening cholesterol level - Routine screening.  - Lipid panel  4. Thyroid disorder screen - Routine screening.  - TSH  5. Pap smear for cervical cancer screening - Cytology - PAP for cervical cancer screening.  - Cytology - PAP([place])  6. Routine screening for STI (sexually transmitted infection) - Routine screening.  - Cervicovaginal ancillary only  7. Colon cancer screening - Keep all scheduled appointments with Gastroenterology.   8. Stage 3a chronic kidney disease ([place]) - Referral to Nephrology for further evaluation/management.  - Ambulatory referral to Nephrology  9. Moderate persistent asthma with (acute) exacerbation 10. Wheezing - Continue present management.  - Referral to Pulmonology for further evaluation/management.  - Ambulatory referral to Pulmonology  11. Lipoma of right upper extremity - Referral to Dermatology for  further evaluation/management.  - Ambulatory referral to Dermatology  12. Need for Tdap vaccination - Administered.  - Tdap vaccine greater than or equal to 7yo IM   Patient was given the opportunity to ask questions.  Patient verbalized understanding of the plan and was able to repeat key elements of the plan. Patient was given clear instructions to go to Emergency Department or return to medical center if symptoms don't improve, worsen, or new problems develop.The patient verbalized understanding.   Orders Placed This Encounter  Procedures   Tdap vaccine greater than or equal to 7yo IM   Hepatic Function Panel   Lipid panel   TSH   Ambulatory referral to Pulmonology   Ambulatory referral to Nephrology   Ambulatory referral to Dermatology    Return in about 1 year (around 01/22/2024) for Physical per patient preference.  Camillia Herter, NP

## 2023-01-18 ENCOUNTER — Other Ambulatory Visit: Payer: Self-pay

## 2023-01-18 DIAGNOSIS — J302 Other seasonal allergic rhinitis: Secondary | ICD-10-CM

## 2023-01-18 MED ORDER — FLUTICASONE PROPIONATE 50 MCG/ACT NA SUSP: 2.0000 | Freq: Every day | NASAL | 0 refills | Status: DC

## 2023-01-22 ENCOUNTER — Other Ambulatory Visit (HOSPITAL_COMMUNITY)
Admission: RE | Admit: 2023-01-22 | Discharge: 2023-01-22 | Disposition: A | Discharge status: Home / Self Care | Payer: 59 | Source: Ambulatory Visit | Attending: Family | Admitting: Family

## 2023-01-22 ENCOUNTER — Encounter: Payer: Self-pay | Admitting: Family

## 2023-01-22 ENCOUNTER — Ambulatory Visit (INDEPENDENT_AMBULATORY_CARE_PROVIDER_SITE_OTHER): Payer: 59 | Admitting: Family

## 2023-01-22 VITALS — BP 115/84 | HR 65 | Temp 98.3°F | Resp 16 | Ht 67.99 in | Wt 278.0 lb

## 2023-01-22 DIAGNOSIS — Z124 Encounter for screening for malignant neoplasm of cervix: Secondary | ICD-10-CM

## 2023-01-22 DIAGNOSIS — N1831 Chronic kidney disease, stage 3a: Secondary | ICD-10-CM

## 2023-01-22 DIAGNOSIS — J4541 Moderate persistent asthma with (acute) exacerbation: Secondary | ICD-10-CM | POA: Diagnosis not present

## 2023-01-22 DIAGNOSIS — Z0000 Encounter for general adult medical examination without abnormal findings: Secondary | ICD-10-CM

## 2023-01-22 DIAGNOSIS — Z1329 Encounter for screening for other suspected endocrine disorder: Secondary | ICD-10-CM

## 2023-01-22 DIAGNOSIS — Z0001 Encounter for general adult medical examination with abnormal findings: Secondary | ICD-10-CM

## 2023-01-22 DIAGNOSIS — Z13220 Encounter for screening for lipoid disorders: Secondary | ICD-10-CM

## 2023-01-22 DIAGNOSIS — Z1151 Encounter for screening for human papillomavirus (HPV): Secondary | ICD-10-CM | POA: Insufficient documentation

## 2023-01-22 DIAGNOSIS — Z01419 Encounter for gynecological examination (general) (routine) without abnormal findings: Secondary | ICD-10-CM | POA: Diagnosis present

## 2023-01-22 DIAGNOSIS — Z113 Encounter for screening for infections with a predominantly sexual mode of transmission: Secondary | ICD-10-CM | POA: Insufficient documentation

## 2023-01-22 DIAGNOSIS — Z13228 Encounter for screening for other metabolic disorders: Secondary | ICD-10-CM | POA: Diagnosis not present

## 2023-01-22 DIAGNOSIS — Z23 Encounter for immunization: Secondary | ICD-10-CM

## 2023-01-22 DIAGNOSIS — Z1211 Encounter for screening for malignant neoplasm of colon: Secondary | ICD-10-CM

## 2023-01-22 DIAGNOSIS — D1721 Benign lipomatous neoplasm of skin and subcutaneous tissue of right arm: Secondary | ICD-10-CM

## 2023-01-22 DIAGNOSIS — R062 Wheezing: Secondary | ICD-10-CM

## 2023-01-22 NOTE — Progress Notes (Signed)
.  Pt presents for annual physical exam and wheezing -been taking Mucinex coughing up phlegm but no relief

## 2023-01-23 ENCOUNTER — Other Ambulatory Visit: Payer: Self-pay | Admitting: Cardiovascular Disease

## 2023-01-23 LAB — TSH: TSH: 3.78 u[IU]/mL (ref 0.450–4.500)

## 2023-01-23 LAB — HEPATIC FUNCTION PANEL
ALT: 14 IU/L (ref 0–32)
AST: 12 IU/L (ref 0–40)
Albumin: 3.8 g/dL (ref 3.8–4.9)
Alkaline Phosphatase: 64 IU/L (ref 44–121)
Bilirubin Total: 0.3 mg/dL (ref 0.0–1.2)
Bilirubin, Direct: <0.1 mg/dL (ref 0.00–0.40)
Total Protein: 6 g/dL (ref 6.0–8.5)

## 2023-01-23 LAB — LIPID PANEL
Chol/HDL Ratio: 2 ratio (ref 0.0–4.4)
Cholesterol, Total: 178 mg/dL (ref 100–199)
HDL: 90 mg/dL (ref >39)
LDL Chol Calc (NIH): 73 mg/dL (ref 0–99)
Triglycerides: 86 mg/dL (ref 0–149)
VLDL Cholesterol Cal: 15 mg/dL (ref 5–40)

## 2023-01-23 LAB — CERVICOVAGINAL ANCILLARY ONLY
Bacterial Vaginitis (gardnerella): NEGATIVE
Candida Glabrata: NEGATIVE
Candida Vaginitis: NEGATIVE
Chlamydia: NEGATIVE
Comment: NEGATIVE
Comment: NEGATIVE
Comment: NEGATIVE
Comment: NEGATIVE
Comment: NEGATIVE
Comment: NORMAL
Neisseria Gonorrhea: NEGATIVE
Trichomonas: NEGATIVE

## 2023-01-24 ENCOUNTER — Telehealth: Payer: Self-pay

## 2023-01-24 LAB — CYTOLOGY - PAP
Comment: NEGATIVE
Diagnosis: NEGATIVE
High risk HPV: NEGATIVE

## 2023-01-24 NOTE — Telephone Encounter (Signed)
     Patient  visit on 2/27  at [facility]  Have you been able to follow up with your primary care physician? Yes   The patient was or was not able to obtain any needed medicine or equipment? Yes    Are there diet recommendations that you are having difficulty following? Na   Patient expresses understanding of discharge instructions and education provided has no other needs at this time.  Yes      [facility] [phone] [address], [place], [place] [address] Phone: [phone] Email: [email or web address]

## 2023-01-24 NOTE — Telephone Encounter (Signed)
This is a CHF pt 

## 2023-01-25 ENCOUNTER — Telehealth: Payer: Self-pay | Admitting: Family

## 2023-01-25 NOTE — Telephone Encounter (Signed)
Copied from [place] [phone]. Topic: General - Other >> Jan 24, 2023 12:54 PM Oley Balm E wrote: Reason for CRM: Pt called reporting that she is missing a Rx for prednisone, states her PCP was supposed to have called it in following her appt earlier this week. [facility] [id number], [place] [id number] Phone: [phone] Fax: [phone]

## 2023-01-26 NOTE — Telephone Encounter (Signed)
Spoke w/pt states that provider was going to send her in some prednisone, adv pt provider is out of office for me to confirm, will f/u w/ her on 03/11 after speaking w/provider

## 2023-02-01 ENCOUNTER — Ambulatory Visit (HOSPITAL_COMMUNITY)
Admission: RE | Admit: 2023-02-01 | Discharge: 2023-02-01 | Disposition: A | Discharge status: Home / Self Care | Payer: 59 | Source: Ambulatory Visit | Attending: Cardiology | Admitting: Cardiology

## 2023-02-01 DIAGNOSIS — I5022 Chronic systolic (congestive) heart failure: Secondary | ICD-10-CM | POA: Insufficient documentation

## 2023-02-01 LAB — PULMONARY FUNCTION TEST
DL/VA % pred: 129 %
DL/VA: 5.34 ml/min/mmHg/L
DLCO cor % pred: 79 %
DLCO cor: 18.82 ml/min/mmHg
DLCO unc % pred: 82 %
DLCO unc: 19.53 ml/min/mmHg
FEF 25-75 Post: 1.37 L/sec
FEF 25-75 Pre: 0.56 L/sec
FEF2575-%Change-Post: 143 %
FEF2575-%Pred-Post: 49 %
FEF2575-%Pred-Pre: 20 %
FEV1-%Change-Post: 44 %
FEV1-%Pred-Post: 48 %
FEV1-%Pred-Pre: 33 %
FEV1-Post: 1.49 L
FEV1-Pre: 1.03 L
FEV1FVC-%Change-Post: 13 %
FEV1FVC-%Pred-Pre: 72 %
FEV6-%Change-Post: 27 %
FEV6-%Pred-Post: 59 %
FEV6-%Pred-Pre: 46 %
FEV6-Post: 2.28 L
FEV6-Pre: 1.79 L
FEV6FVC-%Pred-Post: 103 %
FEV6FVC-%Pred-Pre: 103 %
FVC-%Change-Post: 27 %
FVC-%Pred-Post: 57 %
FVC-%Pred-Pre: 45 %
FVC-Post: 2.28 L
FVC-Pre: 1.79 L
Post FEV1/FVC ratio: 65 %
Post FEV6/FVC ratio: 100 %
Pre FEV1/FVC ratio: 58 %
Pre FEV6/FVC Ratio: 100 %
RV % pred: 138 %
RV: 2.9 L
TLC % pred: 83 %
TLC: 4.74 L

## 2023-02-01 MED ORDER — ALBUTEROL SULFATE (2.5 MG/3ML) 0.083% IN NEBU
2.5000 mg | INHALATION_SOLUTION | Freq: Once | RESPIRATORY_TRACT | Status: AC
  Administered 2023-02-01: 2.5 mg via RESPIRATORY_TRACT

## 2023-02-08 ENCOUNTER — Other Ambulatory Visit: Payer: Self-pay

## 2023-02-08 ENCOUNTER — Ambulatory Visit: Payer: Self-pay

## 2023-02-08 DIAGNOSIS — R062 Wheezing: Secondary | ICD-10-CM

## 2023-02-08 MED ORDER — PREDNISONE 20 MG PO TABS: 60.0000 mg | ORAL_TABLET | Freq: Every day | ORAL | 0 refills | Status: DC

## 2023-02-08 NOTE — Progress Notes (Signed)
Per provider ok to send same Rx for prednisone in to pharmacy as was prescribed before

## 2023-02-08 NOTE — Telephone Encounter (Signed)
Prescription completed and sent.

## 2023-02-08 NOTE — Telephone Encounter (Signed)
      Chief Complaint: Pt. Seen 01/22/23 and states PCP was going to start her on Prednisone. Has not been called in. Still wheezing. Asking for prednisone be sent to [facility] on [address]. Symptoms: Wheezing . "I'm ok, just still wheezing." Frequency: 01/22/23 Pertinent Negatives: Patient denies  Disposition: [] ED /[] Urgent Care (no appt availability in office) / [] Appointment(In office/virtual)/ []  [place] [facility]/ [] Home Care/ [] Refused Recommended Disposition /[] [facility] [facility]/ [x]  Follow-up with PCP Additional Notes: Please advise pt.  Answer Assessment - Initial Assessment Questions 1. DRUG NAME: "What medicine do you need to have refilled?"     Prednisone 2. REFILLS REMAINING: "How many refills are remaining?" (Note: The label on the medicine or pill bottle will show how many refills are remaining. If there are no refills remaining, then a renewal may be needed.)     0 3. EXPIRATION DATE: "What is the expiration date?" (Note: The label states when the prescription will expire, and thus can no longer be refilled.)     N/a 4. PRESCRIBING HCP: "Who prescribed it?" Reason: If prescribed by specialist, call should be referred to that group.     [place] 5. SYMPTOMS: "Do you have any symptoms?"     Still wheezing 6. PREGNANCY: "Is there any chance that you are pregnant?" "When was your last menstrual period?"     No  Protocols used: Medication Refill and Renewal Call-A-AH

## 2023-02-22 ENCOUNTER — Other Ambulatory Visit: Payer: Self-pay | Admitting: Family

## 2023-02-22 DIAGNOSIS — I428 Other cardiomyopathies: Secondary | ICD-10-CM

## 2023-02-22 DIAGNOSIS — I5020 Unspecified systolic (congestive) heart failure: Secondary | ICD-10-CM

## 2023-02-26 ENCOUNTER — Telehealth: Payer: Self-pay | Admitting: Gastroenterology

## 2023-02-26 ENCOUNTER — Encounter: Payer: Self-pay | Admitting: Gastroenterology

## 2023-02-26 ENCOUNTER — Ambulatory Visit (INDEPENDENT_AMBULATORY_CARE_PROVIDER_SITE_OTHER): Payer: 59 | Admitting: Gastroenterology

## 2023-02-26 VITALS — BP 90/64 | HR 60 | Ht 67.99 in | Wt 274.0 lb

## 2023-02-26 DIAGNOSIS — Z1211 Encounter for screening for malignant neoplasm of colon: Secondary | ICD-10-CM

## 2023-02-26 DIAGNOSIS — I509 Heart failure, unspecified: Secondary | ICD-10-CM

## 2023-02-26 MED ORDER — SUTAB 1479-225-188 MG PO TABS: 24.0000 | ORAL_TABLET | Freq: Once | ORAL | 0 refills | Status: AC

## 2023-02-26 NOTE — Telephone Encounter (Signed)
Patient called and has a colonoscopy on 03/15/2023 at [facility]. Needs to reschedule. Please advise.

## 2023-02-26 NOTE — Progress Notes (Signed)
HPI :  57 y/o female with a history of CHF, HTN, wheezing, here to follow-up and schedule colonoscopy for colon cancer screening.  I previously saw her in July 2022.  She was seen at that time to discuss first time colonoscopy.  At the time she was being worked up for heart failure and we wanted that workup completed prior to proceeding with anesthesia.  I have not seen her since that time.  She states she has never had a colonoscopy.  No family history of colon cancer.  Her bowels are regular, she denies any constipation or diarrhea.  No blood in her stools.  She has been followed by cardiology for CHF.  She is on Entresto and regimen as outlined below.  She had an echocardiogram on December 14, 2022 -EF around 30 to 35%.  She has also had some problems with wheezing in the past, referred for PFTs and started on inhalers.  She states her respiratory status has been pretty stable at this time.  She denies any wheezing today.  Generally denies any cardiopulmonary symptoms in general and states she has been feeling well.  She does smoke marijuana rarely and thinks that can make her lung function worsen with wheezing. She was seen in the ED at the end of February with an asthma exacerbation, doing better recently.  She feels she is stable for colonoscopy at this point in time.  Echo 12/14/22: EF 30-35%, grade I DD    Past Medical History:  Diagnosis Date   Asthma    CHF (congestive heart failure)    Congestive heart failure (CHF)    Depression    Elevated brain natriuretic peptide (BNP) level    Hypertension    Lymphedema    SOB (shortness of breath)    Substance abuse      Past Surgical History:  Procedure Laterality Date   ABDOMINAL HYSTERECTOMY     BREAST BIOPSY Right    CESAREAN SECTION     HERNIA REPAIR     umbilical as a child   RIGHT/LEFT HEART CATH AND CORONARY ANGIOGRAPHY N/A 04/01/2021   Procedure: RIGHT/LEFT HEART CATH AND CORONARY ANGIOGRAPHY;  Surgeon: Corky Crafts, MD;  Location: [facility];  Service: Cardiovascular;  Laterality: N/A;   TUBAL LIGATION     Family History  Problem Relation Age of Onset   Colon polyps Mother    Hypertension Mother    Stroke Mother    Deep vein thrombosis Mother    Congestive Heart Failure Mother    Other Father        history unknown   Obesity Sister    Obesity Brother    Congestive Heart Failure Maternal Grandmother    Colon cancer Neg Hx    Esophageal cancer Neg Hx    Rectal cancer Neg Hx    Stomach cancer Neg Hx    Social History   Tobacco Use   Smoking status: Never    Passive exposure: Current   Smokeless tobacco: Never  Vaping Use   Vaping Use: Never used  Substance Use Topics   Alcohol use: Yes    Comment: 1 pint per week   Drug use: Yes    Types: Marijuana    Comment: uses marijuana twice a week   Current Outpatient Medications  Medication Sig Dispense Refill   albuterol (PROVENTIL) (2.5 MG/3ML) 0.083% nebulizer solution Take 3 mLs (2.5 mg total) by nebulization every 4 (four) hours as needed for wheezing or  shortness of breath. 75 mL 1   albuterol (PROVENTIL) (5 MG/ML) 0.5% nebulizer solution Take 0.5 mLs (2.5 mg total) by nebulization every 6 (six) hours as needed for wheezing or shortness of breath. 20 mL 0   albuterol (VENTOLIN HFA) 108 (90 Base) MCG/ACT inhaler Inhale 1-2 puffs into the lungs every 6 (six) hours as needed for wheezing or shortness of breath. 1 each 1   albuterol (VENTOLIN HFA) 108 (90 Base) MCG/ACT inhaler Inhale 2 puffs into the lungs every 4 (four) hours as needed for wheezing or shortness of breath. 1 each 1   ARIPiprazole (ABILIFY) 10 MG tablet Take 10 mg by mouth daily.     aspirin 81 MG chewable tablet Chew 81 mg by mouth daily. aspirin 81 mg chewable tablet     Budeson-Glycopyrrol-Formoterol (BREZTRI AEROSPHERE) 160-9-4.8 MCG/ACT AERO Inhale 2 puffs into the lungs in the morning and at bedtime. 10.7 g 5   carvedilol (COREG) 12.5 MG tablet Take 1  tablet (12.5 mg total) by mouth 2 (two) times daily. 180 tablet 3   dapagliflozin propanediol (FARXIGA) 10 MG TABS tablet Take 1 tablet (10 mg total) by mouth daily. 30 tablet 5   ENTRESTO 49-51 MG TAKE ONE TABLET BY MOUTH TWICE DAILY @ 9AM-5PM 180 tablet 11   fluticasone (FLONASE) 50 MCG/ACT nasal spray Place 2 sprays into both nostrils daily. 48 g 0   furosemide (LASIX) 20 MG tablet Take 1 tablet (20 mg total) by mouth daily. 30 tablet 5   hydrOXYzine (VISTARIL) 50 MG capsule Take 50 mg by mouth 2 (two) times daily.     montelukast (SINGULAIR) 10 MG tablet TAKE 1 TABLET(10 MG) BY MOUTH AT BEDTIME 30 tablet 2   Multiple Vitamins-Minerals (ADULT ONE DAILY GUMMIES) CHEW Chew 2 tablets by mouth daily with breakfast.     spironolactone (ALDACTONE) 25 MG tablet TAKE 1 TABLET(25 MG) BY MOUTH DAILY 90 tablet 3   traZODone (DESYREL) 150 MG tablet Take 150 mg by mouth at bedtime.     No current facility-administered medications for this visit.   Allergies  Allergen Reactions   Clarithromycin Other (See Comments)    GI upset   Penicillins Rash, Hives and Other (See Comments)     Review of Systems: All systems reviewed and negative except where noted in HPI.   Lab Results  Component Value Date   WBC 7.0 01/16/2023   HGB 14.7 01/16/2023   HCT 45.1 01/16/2023   MCV 84.9 01/16/2023   PLT 230 01/16/2023    Lab Results  Component Value Date   CREATININE 1.30 (H) 01/16/2023   BUN 12 01/16/2023   NA 139 01/16/2023   K 4.0 01/16/2023   CL 104 01/16/2023   CO2 25 01/16/2023    Lab Results  Component Value Date   ALT 14 01/22/2023   AST 12 01/22/2023   ALKPHOS 64 01/22/2023   BILITOT 0.3 01/22/2023     Physical Exam: BP 90/64   Pulse 60   Ht 5' 7.99" (1.727 m)   Wt 274 lb (124.3 kg)   SpO2 96%   BMI 41.67 kg/m  Constitutional: Pleasant,well-developed, female in no acute distress..  Cardiovascular: Normal rate, regular rhythm.  Pulmonary/chest: Effort normal and breath sounds  normal.  Extremities: no edema Neurological: Alert and oriented to person place and time. Psychiatric: Normal mood and affect. Behavior is normal.   ASSESSMENT: 57 y.o. adult here for assessment of the following  1. Colon cancer screening   2. Chronic congestive heart  failure, unspecified heart failure type    Is overdue for routine colon cancer screening.  She is asymptomatic from a bowel perspective.  We discussed options for colon cancer screening to include stool based testing versus optical colonoscopy.  Recommend optical colonoscopy if she is stable for it.  We discussed her history of CHF and asthma/wheezing.  Her EF is between 30 and 35%, putting her at higher than average risk for anesthesia.  If we proceed with colonoscopy her case would need to be done at the hospital with anesthesia support.  She appears stable from cardiopulmonary perspective.  On inhalers her breathing is improved, she denies any cardiopulmonary symptoms currently.  No further cardiac testing planned at this time, I think she is stable for procedures if done at the hospital.  Following discussion of risks and benefits she wishes to proceed.  I highly recommend she avoid all marijuana or tobacco products, especially around the time of her exam.  She should continue her pulmonary inhalers around the time of her exams as well.  She agrees with the plan further recommendations pending result.  PLAN: - colonoscopy for screening - to be scheduled at the hospital for anesthesia support  - no tobacco prior to the exam - continue pulmonary inhalers and her cardiac regimen   Harlin RainSteve Muskaan Smet, MD [facility]

## 2023-02-26 NOTE — Telephone Encounter (Signed)
Called and spoke to patient.  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  Offered her July 18th. She said agreed.  I will contact her as time get nearer regarding a previsit - via phone since she was already instructed today. Wants to use Sutab which was sent to [facility] today.

## 2023-02-26 NOTE — Patient Instructions (Addendum)
You have been scheduled for a colonoscopy at [facility] on 03-15-23. Please follow written instructions given to you at your visit today.  Please pick up your prep supplies at the pharmacy within the next 1-3 days. If you use inhalers (even only as needed), please bring them with you on the day of your procedure.  Please refrain from using marijuana prior to your procedure. Thank you  PLEASE CALL AND LET us KNOW AS SOON AS POSSIBLE IF YOU CAN NOT KEEP THIS DATE:  [phone]  Thank you for entrusting me with your care and for choosing [facility], Dr. Ileene Patrick   If your blood pressure at your visit was 140/90 or greater, please contact your primary care physician to follow up on this.  _______________________________________________________  If you are age 25 or older, your body mass index should be between 23-30. Your Body mass index is 41.67 kg/m. If this is out of the aforementioned range listed, please consider follow up with your Primary Care Provider.  If you are age 60 or younger, your body mass index should be between 19-25. Your Body mass index is 41.67 kg/m. If this is out of the aformentioned range listed, please consider follow up with your Primary Care Provider.   ________________________________________________________  The [facility] providers would like to encourage you to use [facility] to communicate with providers for non-urgent requests or questions.  Due to long hold times on the telephone, sending your provider a message by [facility] may be a faster and more efficient way to get a response.  Please allow 48 business hours for a response.  Please remember that this is for non-urgent requests.  _______________________________________________________  Due to recent changes in healthcare laws, you may see the results of your imaging and laboratory studies on MyChart before your provider has had a chance to review them.  We understand that in some  cases there may be results that are confusing or concerning to you. Not all laboratory results come back in the same time frame and the provider may be waiting for multiple results in order to interpret others.  Please give [place] 48 hours in order for your provider to thoroughly review all the results before contacting the office for clarification of your results.

## 2023-02-27 ENCOUNTER — Ambulatory Visit (INDEPENDENT_AMBULATORY_CARE_PROVIDER_SITE_OTHER): Payer: 59 | Admitting: Pulmonary Disease

## 2023-02-27 ENCOUNTER — Encounter: Payer: Self-pay | Admitting: Pulmonary Disease

## 2023-02-27 VITALS — BP 128/74 | HR 72 | Wt 276.4 lb

## 2023-02-27 DIAGNOSIS — J4541 Moderate persistent asthma with (acute) exacerbation: Secondary | ICD-10-CM | POA: Diagnosis not present

## 2023-02-27 MED ORDER — PREDNISONE 20 MG PO TABS: 40.0000 mg | ORAL_TABLET | Freq: Every day | ORAL | 0 refills | Status: AC

## 2023-02-27 NOTE — Addendum Note (Signed)
Addended byVilma Meckel on: 02/27/2023 09:15 AM   Modules accepted: Orders

## 2023-02-27 NOTE — Telephone Encounter (Signed)
Patient rescheduled to July 18th at 7:30 am

## 2023-02-27 NOTE — Progress Notes (Signed)
@Patient  ID: Janet [name], adult    DOB: 06/25/1966, 57 y.o.   MRN: [id number]  CC: DOE  Referring provider: Rema Fendt, NP  HPI:   57 year old whom we are seeing in follow up  for evaluation of dyspnea on exertion and wheeze. ED notes 11/2022 and 12/2022. Most recent GI note reviewed. Most recent cardiology note reviewed.   Patient lost to follow-up.  Last seen a year ago.  Seems like things have been going well.  From asthma standpoint.  Pressure really help.  Minimal symptoms.  She is Like Viral Illness 57/2024.  Some Fever Runny Nose Etc.  This Prompted or Caused Worsening Respiratory Symptoms.  Wheeze.  Shortness of Breath.  ED Visit 11/2022.  Treated for Asthma Exacerbation.  Improved.  Recurrent Symptoms 12/2022, Late Month.  Again Improved with Prednisone Taper.  She Reports That She Is Using Pressure Every Night.  But Misses Morning Doses.  Stressed Importance of Twice Daily Dosing.  Discussed Role and Rationale for Testing and Consideration of Biologic Therapy Given Recurrent Exacerbations Recently.  Likely Due To Environmental Changes.  Prednisone Prescribed Today.  HPI at initial visit: Patient was ports dyspnea on exertion present for many months.  She noted increasing lower examinee swelling.  She presented to the hospital.  TTE 03/30/2021 revealed EF 30 to 35%, mildly dilated LA. EF further reduced from TTE reviewed 08/12/2020 when was 46%.  She had a right heart cath and left heart cath 04/01/2021 with no coronary disease, elevated LVEDP 30, mean PA pressure in the 30s.  She received extensive diuresis.  She had great improvement in dyspnea.  Her dyspnea is quite improved in the outpatient setting.  Weight is stable.  Feels like swelling is doing well.  History of atopic symptoms.  Had used albuterol inhaler and duo nebs with mild improvement in symptoms at times.  Has found Symbicort to be somewhat helpful.  Continues Singulair.  Breathing does get worse with pollens or  seasonal changes.  No environmental factors she can identify.  No time during there things are better or worse.  No other relieving or exacerbating factors.  Chest x-ray 11/2020 and 03/2021 both reviewed and interpreted as clear lungs.  CT chest 03/29/2021 reviewed and interpreted as mild mosaicism, otherwise clear lungs.  PMH: asthma, NICM Surgical History: Hysterectomy, C-section, hernia repair Family history: Mother with hypertension, CVA, DVT, CHF Social history: Never smoker, lives in [place] / Pulmonary Flowsheets:   ACT:  Asthma Control Test ACT Total Score  02/27/2023  8:50 AM 12  04/27/2021  1:48 PM 16    MMRC:     No data to display          Epworth:      No data to display          Tests:   FENO:  No results found for: "NITRICOXIDE"  PFT:    Latest Ref Rng & Units 02/01/2023    9:33 AM  PFT Results  FVC-Pre L 1.79   FVC-Predicted Pre % 45   FVC-Post L 2.28   FVC-Predicted Post % 57   Pre FEV1/FVC % % 58   Post FEV1/FCV % % 65   FEV1-Pre L 1.03   FEV1-Predicted Pre % 33   FEV1-Post L 1.49   DLCO uncorrected ml/min/mmHg 19.53   DLCO UNC% % 82   DLCO corrected ml/min/mmHg 18.82   DLCO COR %Predicted % 79   DLVA Predicted % 129   TLC L  4.74   TLC % Predicted % 83   RV % Predicted % 138    WALK:      No data to display          Imaging: Personally reviewed and as per EMR discussion in this note No results found.   Lab Results: Personally reviewed CBC    Component Value Date/Time   WBC 7.0 01/16/2023 0721   RBC 5.31 (H) 01/16/2023 0721   HGB 14.7 01/16/2023 0721   HCT 45.1 01/16/2023 0721   PLT 230 01/16/2023 0721   MCV 84.9 01/16/2023 0721   MCH 27.7 01/16/2023 0721   MCHC 32.6 01/16/2023 0721   RDW 15.2 01/16/2023 0721    BMET    Component Value Date/Time   NA 139 01/16/2023 0721   NA 139 10/10/2021 1655   K 4.0 01/16/2023 0721   CL 104 01/16/2023 0721   CO2 25 01/16/2023 0721   GLUCOSE 109 (H)  01/16/2023 0721   BUN 12 01/16/2023 0721   BUN 15 10/10/2021 1655   CREATININE 1.30 (H) 01/16/2023 0721   CALCIUM 9.1 01/16/2023 0721   GFRNONAA 49 (L) 01/16/2023 0721    BNP    Component Value Date/Time   BNP 119.7 (H) 01/16/2023 0721    ProBNP No results found for: "PROBNP"  Specialty Problems       Pulmonary Problems   DOE (dyspnea on exertion)   Asthma    Allergies  Allergen Reactions   Clarithromycin Other (See Comments)    GI upset   Penicillins Rash, Hives and Other (See Comments)    Immunization History  Administered Date(s) Administered   PFIZER(Purple Top)SARS-COV-2 Vaccination 07/05/2020, 07/29/2020   Tdap 01/22/2023    Past Medical History:  Diagnosis Date   Asthma    CHF (congestive heart failure)    Congestive heart failure (CHF)    Depression    Elevated brain natriuretic peptide (BNP) level    Hypertension    Lymphedema    SOB (shortness of breath)    Substance abuse     Tobacco History: Social History   Tobacco Use  Smoking Status Never   Passive exposure: Current  Smokeless Tobacco Never   Counseling given: Not Answered   Continue to not smoke  Outpatient Encounter Medications as of 02/27/2023  Medication Sig   albuterol (PROVENTIL) (2.5 MG/3ML) 0.083% nebulizer solution Take 3 mLs (2.5 mg total) by nebulization every 4 (four) hours as needed for wheezing or shortness of breath.   albuterol (PROVENTIL) (5 MG/ML) 0.5% nebulizer solution Take 0.5 mLs (2.5 mg total) by nebulization every 6 (six) hours as needed for wheezing or shortness of breath.   albuterol (VENTOLIN HFA) 108 (90 Base) MCG/ACT inhaler Inhale 1-2 puffs into the lungs every 6 (six) hours as needed for wheezing or shortness of breath.   albuterol (VENTOLIN HFA) 108 (90 Base) MCG/ACT inhaler Inhale 2 puffs into the lungs every 4 (four) hours as needed for wheezing or shortness of breath.   ARIPiprazole (ABILIFY) 10 MG tablet Take 10 mg by mouth daily.   aspirin 81 MG  chewable tablet Chew 81 mg by mouth daily. aspirin 81 mg chewable tablet   Budeson-Glycopyrrol-Formoterol (BREZTRI AEROSPHERE) 160-9-4.8 MCG/ACT AERO Inhale 2 puffs into the lungs in the morning and at bedtime.   carvedilol (COREG) 12.5 MG tablet Take 1 tablet (12.5 mg total) by mouth 2 (two) times daily.   dapagliflozin propanediol (FARXIGA) 10 MG TABS tablet Take 1 tablet (10 mg total) by mouth  daily.   ENTRESTO 49-51 MG TAKE ONE TABLET BY MOUTH TWICE DAILY @ 9AM-5PM   fluticasone (FLONASE) 50 MCG/ACT nasal spray Place 2 sprays into both nostrils daily.   furosemide (LASIX) 20 MG tablet Take 1 tablet (20 mg total) by mouth daily.   hydrOXYzine (VISTARIL) 50 MG capsule Take 50 mg by mouth 2 (two) times daily.   montelukast (SINGULAIR) 10 MG tablet TAKE 1 TABLET(10 MG) BY MOUTH AT BEDTIME   Multiple Vitamins-Minerals (ADULT ONE DAILY GUMMIES) CHEW Chew 2 tablets by mouth daily with breakfast.   predniSONE (DELTASONE) 20 MG tablet Take 2 tablets (40 mg total) by mouth daily with breakfast for 5 days.   spironolactone (ALDACTONE) 25 MG tablet TAKE 1 TABLET(25 MG) BY MOUTH DAILY   traZODone (DESYREL) 150 MG tablet Take 150 mg by mouth at bedtime.   No facility-administered encounter medications on file as of 02/27/2023.     Review of Systems  Review of Systems  N/a Physical Exam  BP 128/74 (BP Location: Left Arm, Patient Position: Sitting, Cuff Size: Normal)   Pulse 72   Wt 276 lb 6.4 oz (125.4 kg)   SpO2 95%   BMI 42.04 kg/m   Wt Readings from Last 5 Encounters:  02/27/23 276 lb 6.4 oz (125.4 kg)  02/26/23 274 lb (124.3 kg)  01/22/23 278 lb (126.1 kg)  01/08/23 279 lb 6.4 oz (126.7 kg)  12/17/22 275 lb (124.7 kg)    BMI Readings from Last 5 Encounters:  02/27/23 42.04 kg/m  02/26/23 41.67 kg/m  01/22/23 42.28 kg/m  01/08/23 42.48 kg/m  12/17/22 41.81 kg/m     Physical Exam General: Sitting in chair, no acute distress Eyes: EOMI, no icterus Neck: Supple, no JVP  appreciated Cardiovascular: Regular rate and rhythm, no murmurs Pulmonary: Clear to auscultation bilaterally, sounds like upper airway sounds on early exhalation, possible mild wheeze, normal work of breathing Abdomen: Nondistended, bowel sounds present MSK: No synovitis, no joint effusion Neuro: Normal gait, no weakness Psych: Normal, full affect   Assessment & Plan:   DOE: Largely related to volume overload much improved on diuretics. Component of deconditioning, weight, asthma as well.  Possibly asthma flaring with change in environment recently.  Recent chest imaging clear.  See below.  Asthma: Atopic symptoms. 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 continue Breztri, stressed importance of twice daily dosing, she is missing morning doses and soundly fairly frequently.  CBC with differential, IgE data phenotype consideration of Biologics.  Prednisone prescription today.  If symptoms not improving in the next few weeks despite good adherence to inhaler therapy, must consider addition of biologic therapy.   Return in about 6 weeks (around 04/10/2023) for f/u Dr. Judeth Horn.   Karren Burly, MD 02/27/2023   I spent 40 minutes in the care of the patient including review of records, face-to-face visit, coordination of care.

## 2023-02-27 NOTE — Patient Instructions (Signed)
Nice to see you again  Please try to take Breztri 2 puffs twice a day every day.  Rinse your mouth with water after every use  I worry may be a viral infection in January triggered worsening symptoms and now with a change in environment, new living situation carpet etc. it is causing her asthma to continue to flare  Will get blood work today to see if additional medications in addition to the inhalers would help your asthma symptoms  I sent in prednisone, take 40 mg a day for 5 days then stop given you have wheeze on exam and more short of breath over the last few days  It is possible that extra fluid call shortness of breath and wheeze as well.  Please keep a close eye on your weights etc. and continue your Lasix as prescribed.  Return to clinic in 6 weeks or sooner as needed with Dr. Judeth Horn

## 2023-03-08 ENCOUNTER — Ambulatory Visit: Payer: Self-pay

## 2023-03-08 ENCOUNTER — Telehealth: Payer: 59 | Admitting: Family Medicine

## 2023-03-08 DIAGNOSIS — B9689 Other specified bacterial agents as the cause of diseases classified elsewhere: Secondary | ICD-10-CM

## 2023-03-08 DIAGNOSIS — J208 Acute bronchitis due to other specified organisms: Secondary | ICD-10-CM | POA: Diagnosis not present

## 2023-03-08 MED ORDER — AZITHROMYCIN 250 MG PO TABS: ORAL_TABLET | ORAL | 0 refills | Status: AC

## 2023-03-08 NOTE — Progress Notes (Signed)
Virtual Visit Consent   Janet [name], you are scheduled for a virtual visit with a [facility] provider today. Just as with appointments in the office, your consent must be obtained to participate. Your consent will be active for this visit and any virtual visit you may have with one of our providers in the next 365 days. If you have a MyChart account, a copy of this consent can be sent to you electronically.  As this is a virtual visit, video technology does not allow for your provider to perform a traditional examination. This may limit your provider's ability to fully assess your condition. If your provider identifies any concerns that need to be evaluated in person or the need to arrange testing (such as labs, EKG, etc.), we will make arrangements to do so. Although advances in technology are sophisticated, we cannot ensure that it will always work on either your end or our end. If the connection with a video visit is poor, the visit may have to be switched to a telephone visit. With either a video or telephone visit, we are not always able to ensure that we have a secure connection.  By engaging in this virtual visit, you consent to the provision of healthcare and authorize for your insurance to be billed (if applicable) for the services provided during this visit. Depending on your insurance coverage, you may receive a charge related to this service.  I need to obtain your verbal consent now. Are you willing to proceed with your visit today? Janet [name] has provided verbal consent on 03/08/2023 for a virtual visit (video or telephone). Freddy Finner, NP  Date: 03/08/2023 11:19 AM  Virtual Visit via Video Note   I, Freddy Finner, connected with  Janet [name]  ([id number], [birth year]) on 03/08/23 at 11:15 AM EDT by a video-enabled telemedicine application and verified that I am speaking with the correct person using two identifiers.  Location: Patient: Virtual Visit  Location Patient: Home Provider: Virtual Visit Location Provider: Home Office   I discussed the limitations of evaluation and management by telemedicine and the availability of in person appointments. The patient expressed understanding and agreed to proceed.    History of Present Illness: Janet [name] is a 57 y.o. who identifies as a female who was assigned adult at birth, and is being seen today for shortness of breath  Onset was  3 days ago with coughing and runny nose Associated symptoms are shortness of breath, nasal drainage and chest congestion, hoarseness, feverish and chills Modifying factors are prednisone-40mg  x 5 days- started yesterday took it to help, breathing treatments q 4 hours Denies chest pain Exposure to sick contacts- known  Problems:  Patient Active Problem List   Diagnosis Date Noted   Prediabetes 08/03/2022   Acute systolic heart failure    Essential hypertension 02/17/2021   Asthma    Lymphedema    Elevated brain natriuretic peptide (BNP) level    DOE (dyspnea on exertion) 07/28/2020   Morbid obesity 07/28/2020    Allergies:  Allergies  Allergen Reactions   Clarithromycin Other (See Comments)    GI upset   Penicillins Rash, Hives and Other (See Comments)   Medications:  Current Outpatient Medications:    albuterol (PROVENTIL) (2.5 MG/3ML) 0.083% nebulizer solution, Take 3 mLs (2.5 mg total) by nebulization every 4 (four) hours as needed for wheezing or shortness of breath., Disp: 75 mL, Rfl: 1   albuterol (PROVENTIL) (5 MG/ML) 0.5% nebulizer solution,  Take 0.5 mLs (2.5 mg total) by nebulization every 6 (six) hours as needed for wheezing or shortness of breath., Disp: 20 mL, Rfl: 0   albuterol (VENTOLIN HFA) 108 (90 Base) MCG/ACT inhaler, Inhale 1-2 puffs into the lungs every 6 (six) hours as needed for wheezing or shortness of breath., Disp: 1 each, Rfl: 1   albuterol (VENTOLIN HFA) 108 (90 Base) MCG/ACT inhaler, Inhale 2 puffs into the lungs  every 4 (four) hours as needed for wheezing or shortness of breath., Disp: 1 each, Rfl: 1   ARIPiprazole (ABILIFY) 10 MG tablet, Take 10 mg by mouth daily., Disp: , Rfl:    aspirin 81 MG chewable tablet, Chew 81 mg by mouth daily. aspirin 81 mg chewable tablet, Disp: , Rfl:    Budeson-Glycopyrrol-Formoterol (BREZTRI AEROSPHERE) 160-9-4.8 MCG/ACT AERO, Inhale 2 puffs into the lungs in the morning and at bedtime., Disp: 10.7 g, Rfl: 5   carvedilol (COREG) 12.5 MG tablet, Take 1 tablet (12.5 mg total) by mouth 2 (two) times daily., Disp: 180 tablet, Rfl: 3   dapagliflozin propanediol (FARXIGA) 10 MG TABS tablet, Take 1 tablet (10 mg total) by mouth daily., Disp: 30 tablet, Rfl: 5   ENTRESTO 49-51 MG, TAKE ONE TABLET BY MOUTH TWICE DAILY @ 9AM-5PM, Disp: 180 tablet, Rfl: 11   fluticasone (FLONASE) 50 MCG/ACT nasal spray, Place 2 sprays into both nostrils daily., Disp: 48 g, Rfl: 0   furosemide (LASIX) 20 MG tablet, Take 1 tablet (20 mg total) by mouth daily., Disp: 30 tablet, Rfl: 5   hydrOXYzine (VISTARIL) 50 MG capsule, Take 50 mg by mouth 2 (two) times daily., Disp: , Rfl:    montelukast (SINGULAIR) 10 MG tablet, TAKE 1 TABLET(10 MG) BY MOUTH AT BEDTIME, Disp: 30 tablet, Rfl: 2   Multiple Vitamins-Minerals (ADULT ONE DAILY GUMMIES) CHEW, Chew 2 tablets by mouth daily with breakfast., Disp: , Rfl:    spironolactone (ALDACTONE) 25 MG tablet, TAKE 1 TABLET(25 MG) BY MOUTH DAILY, Disp: 90 tablet, Rfl: 3   traZODone (DESYREL) 150 MG tablet, Take 150 mg by mouth at bedtime., Disp: , Rfl:   Observations/Objective: Patient is well-developed, well-nourished in no acute distress.  Resting comfortably  at home.  Head is normocephalic, atraumatic.  No labored breathing.  Speech is clear and coherent with logical content.  Patient is alert and oriented at baseline.    Assessment and Plan:  1. Acute bacterial bronchitis  - azithromycin (ZITHROMAX) 250 MG tablet; Take 2 tablets on day 1, then 1 tablet  daily on days 2 through 5  Dispense: 6 tablet; Refill: 0  -Take meds as prescribed- delayed RX- she has started pred that she had on hand- advised to see how that and Flonase and Singular and her inhalers help- she denies needing a refill on her inhalers at this time -Rest -Use a cool mist humidifier especially during the winter months when heat dries out the air. - Use saline nose sprays frequently to help soothe nasal passages and promote drainage. -Saline irrigations of the nose can be very helpful if done frequently.             * 4X daily for 1 week*             * Use of a nettie pot can be helpful with this.  *Follow directions with this* *Boiled or distilled water only -stay hydrated by drinking plenty of fluids - Keep thermostat turn down low to prevent drying out sinuses - For any cough or  congestion- robitussin DM or Delsym as needed - For fever or aches or pains- take tylenol or ibuprofen as directed on bottle             * for fevers greater than 101 orally you may alternate ibuprofen and tylenol every 3 hours.  If you do not improve you will need a follow up visit in person.               Reviewed side effects, risks and benefits of medication.    Patient acknowledged agreement and understanding of the plan.   Past Medical, Surgical, Social History, Allergies, and Medications have been Reviewed.   Follow Up Instructions: I discussed the assessment and treatment plan with the patient. The patient was provided an opportunity to ask questions and all were answered. The patient agreed with the plan and demonstrated an understanding of the instructions.  A copy of instructions were sent to the patient via MyChart unless otherwise noted below.    The patient was advised to call back or seek an in-person evaluation if the symptoms worsen or if the condition fails to improve as anticipated.  Time:  I spent 10 minutes with the patient via telehealth technology discussing the  above problems/concerns.    Freddy Finner, NP

## 2023-03-08 NOTE — Telephone Encounter (Signed)
Pt would need to f/u w/Pulmonology to see if they can complete an video visit for symptoms.

## 2023-03-08 NOTE — Patient Instructions (Addendum)
Janet [name], thank you for joining Freddy Finner, NP for today's virtual visit.  While this provider is not your primary care provider (PCP), if your PCP is located in our provider database this encounter information will be shared with them immediately following your visit.   A [place] MyChart account gives you access to today's visit and all your visits, tests, and labs performed at [facility] " click here if you don't have a [facility] MyChart account or go to [email or web address]  Consent: (Patient) Janet [name] provided verbal consent for this virtual visit at the beginning of the encounter.  Current Medications:  Current Outpatient Medications:    [START ON 03/10/2023] azithromycin (ZITHROMAX) 250 MG tablet, Take 2 tablets on day 1, then 1 tablet daily on days 2 through 5, Disp: 6 tablet, Rfl: 0   albuterol (PROVENTIL) (2.5 MG/3ML) 0.083% nebulizer solution, Take 3 mLs (2.5 mg total) by nebulization every 4 (four) hours as needed for wheezing or shortness of breath., Disp: 75 mL, Rfl: 1   albuterol (PROVENTIL) (5 MG/ML) 0.5% nebulizer solution, Take 0.5 mLs (2.5 mg total) by nebulization every 6 (six) hours as needed for wheezing or shortness of breath., Disp: 20 mL, Rfl: 0   albuterol (VENTOLIN HFA) 108 (90 Base) MCG/ACT inhaler, Inhale 1-2 puffs into the lungs every 6 (six) hours as needed for wheezing or shortness of breath., Disp: 1 each, Rfl: 1   albuterol (VENTOLIN HFA) 108 (90 Base) MCG/ACT inhaler, Inhale 2 puffs into the lungs every 4 (four) hours as needed for wheezing or shortness of breath., Disp: 1 each, Rfl: 1   ARIPiprazole (ABILIFY) 10 MG tablet, Take 10 mg by mouth daily., Disp: , Rfl:    aspirin 81 MG chewable tablet, Chew 81 mg by mouth daily. aspirin 81 mg chewable tablet, Disp: , Rfl:    Budeson-Glycopyrrol-Formoterol (BREZTRI AEROSPHERE) 160-9-4.8 MCG/ACT AERO, Inhale 2 puffs into the lungs in the morning and at bedtime., Disp: 10.7  g, Rfl: 5   carvedilol (COREG) 12.5 MG tablet, Take 1 tablet (12.5 mg total) by mouth 2 (two) times daily., Disp: 180 tablet, Rfl: 3   dapagliflozin propanediol (FARXIGA) 10 MG TABS tablet, Take 1 tablet (10 mg total) by mouth daily., Disp: 30 tablet, Rfl: 5   ENTRESTO 49-51 MG, TAKE ONE TABLET BY MOUTH TWICE DAILY @ 9AM-5PM, Disp: 180 tablet, Rfl: 11   fluticasone (FLONASE) 50 MCG/ACT nasal spray, Place 2 sprays into both nostrils daily., Disp: 48 g, Rfl: 0   furosemide (LASIX) 20 MG tablet, Take 1 tablet (20 mg total) by mouth daily., Disp: 30 tablet, Rfl: 5   hydrOXYzine (VISTARIL) 50 MG capsule, Take 50 mg by mouth 2 (two) times daily., Disp: , Rfl:    montelukast (SINGULAIR) 10 MG tablet, TAKE 1 TABLET(10 MG) BY MOUTH AT BEDTIME, Disp: 30 tablet, Rfl: 2   Multiple Vitamins-Minerals (ADULT ONE DAILY GUMMIES) CHEW, Chew 2 tablets by mouth daily with breakfast., Disp: , Rfl:    spironolactone (ALDACTONE) 25 MG tablet, TAKE 1 TABLET(25 MG) BY MOUTH DAILY, Disp: 90 tablet, Rfl: 3   traZODone (DESYREL) 150 MG tablet, Take 150 mg by mouth at bedtime., Disp: , Rfl:    Medications ordered in this encounter:  Meds ordered this encounter  Medications   azithromycin (ZITHROMAX) 250 MG tablet    Sig: Take 2 tablets on day 1, then 1 tablet daily on days 2 through 5    Dispense:  6 tablet    Refill:  0    Order Specific Question:   Supervising Provider    Answer:   Merrilee Jansky [[id number]]     *If you need refills on other medications prior to your next appointment, please contact your pharmacy*  Follow-Up: Call back or seek an in-person evaluation if the symptoms worsen or if the condition fails to improve as anticipated.  [facility] [phone]  Other Instructions  - Take meds as prescribed- delayed Antibiotic - pick up Sat if not improved - Rest voice - Use a cool mist humidifier especially during the winter months when heat dries out the air. - Use saline nose sprays  frequently to help soothe nasal passages if they are drying out. - Stay hydrated by drinking plenty of fluids - Keep thermostat turn down low to prevent drying out which can cause a dry cough. - For any cough or congestion- robitussin DM or Delsym as needed - For fever or aches or pains- take tylenol or ibuprofen as directed on bottle             * for fevers greater than 101 orally you may alternate ibuprofen and tylenol every 3 hours.  If you do not improve you will need a follow up visit in person.                   If you have been instructed to have an in-person evaluation today at a local Urgent Care facility, please use the link below. It will take you to a list of all of our available [facility], including address, phone number and hours of operation. Please do not delay care.  [facility]  If you or a family member do not have a primary care provider, use the link below to schedule a visit and establish care. When you choose a [facility] primary care physician or advanced practice provider, you gain a long-term partner in health. Find a Primary Care Provider  Learn more about [facility]'s in-office and virtual care options: [facility] - Get Care Now

## 2023-03-08 NOTE — Telephone Encounter (Signed)
  Chief Complaint: Shortness of breath Symptoms: above Frequency: 3 days Pertinent Negatives: Patient denies  Disposition: [] ED /[] Urgent Care (no appt availability in office) / [] Appointment(In office/virtual)/ [x]  [facility]/ [] Home Care/ [] Refused Recommended Disposition /[] [facility]/ []  Follow-up with PCP Additional Notes: PT has a history of asthma. Pt is having mild/moderate SOB. PT is using her inhalers and breathing treatments and having good results. Pt has s/s of URI, cough, runny nose and congestion. PT was seen at pulmonary on the 9th pt was given steroids at that time, but did not begin them until now.  Pt desires vv so she does not have to leave her home. PT understands that VV may refer her to UC or ED for care.   Reason for Disposition  [1] MILD difficulty breathing (e.g., minimal/no SOB at rest, SOB with walking, pulse <100) AND [2] NEW-onset or WORSE than normal  Answer Assessment - Initial Assessment Questions 1. RESPIRATORY STATUS: "Describe your breathing?" (e.g., wheezing, shortness of breath, unable to speak, severe coughing)      Wheezing 2. ONSET: "When did this breathing problem begin?"      3 days 3. PATTERN "Does the difficult breathing come and go, or has it been constant since it started?"      Constant 4. SEVERITY: "How bad is your breathing?" (e.g., mild, moderate, severe)    - MILD: No SOB at rest, mild SOB with walking, speaks normally in sentences, can lie down, no retractions, pulse < 100.    - MODERATE: SOB at rest, SOB with minimal exertion and prefers to sit, cannot lie down flat, speaks in phrases, mild retractions, audible wheezing, pulse 100-120.    - SEVERE: Very SOB at rest, speaks in single words, struggling to breathe, sitting hunched forward, retractions, pulse > 120      mild 5. RECURRENT SYMPTOM: "Have you had difficulty breathing before?" If Yes, ask: "When was the last time?" and "What happened that time?"       yes 6. CARDIAC HISTORY: "Do you have any history of heart disease?" (e.g., heart attack, angina, bypass surgery, angioplasty)       7. LUNG HISTORY: "Do you have any history of lung disease?"  (e.g., pulmonary embolus, asthma, emphysema)     Asthma 8. CAUSE: "What do you think is causing the breathing problem?"      Bronchitis 9. OTHER SYMPTOMS: "Do you have any other symptoms? (e.g., dizziness, runny nose, cough, chest pain, fever)     Runny nose congestion cough Fever  Protocols used: Breathing Difficulty-A-AH

## 2023-03-30 ENCOUNTER — Other Ambulatory Visit: Payer: Self-pay | Admitting: Family

## 2023-03-30 DIAGNOSIS — J4540 Moderate persistent asthma, uncomplicated: Secondary | ICD-10-CM

## 2023-03-30 NOTE — Telephone Encounter (Signed)
Requested Prescriptions  Pending Prescriptions Disp Refills   montelukast (SINGULAIR) 10 MG tablet [Pharmacy Med Name: MONTELUKAST 10MG  TABLETS] 90 tablet 3    Sig: TAKE 1 TABLET(10 MG) BY MOUTH AT BEDTIME     Pulmonology:  Leukotriene Inhibitors Passed - 03/30/2023 10:25 AM      Passed - Valid encounter within last 12 months    Recent Outpatient Visits           2 months ago Annual physical exam   [facility], Amy J, NP   8 months ago Lipoma of right upper extremity   [facility], Amy J, NP   2 years ago Moderate persistent asthma, unspecified whether complicated   [facility], [place], NP   2 years ago Annual physical exam   [facility], Amy J, NP   2 years ago Encounter to establish care   [facility], Salomon Fick, NP       Future Appointments             In 1 week Hunsucker, Lesia Sago, MD [facility]  Pulmonary Care at [facility]

## 2023-04-10 ENCOUNTER — Ambulatory Visit: Payer: 59 | Admitting: Pulmonary Disease

## 2023-04-12 ENCOUNTER — Other Ambulatory Visit (HOSPITAL_COMMUNITY): Payer: Self-pay | Admitting: Cardiology

## 2023-04-12 DIAGNOSIS — I5020 Unspecified systolic (congestive) heart failure: Secondary | ICD-10-CM

## 2023-04-12 DIAGNOSIS — I428 Other cardiomyopathies: Secondary | ICD-10-CM

## 2023-04-12 MED ORDER — DAPAGLIFLOZIN PROPANEDIOL 10 MG PO TABS
10.0000 mg | ORAL_TABLET | Freq: Every day | ORAL | 5 refills | Status: DC
Start: 2023-04-12 — End: 2023-10-16

## 2023-04-12 MED ORDER — SPIRONOLACTONE 25 MG PO TABS
ORAL_TABLET | ORAL | 3 refills | Status: DC
Start: 2023-04-12 — End: 2024-03-14

## 2023-04-19 ENCOUNTER — Ambulatory Visit: Payer: 59 | Admitting: Pulmonary Disease

## 2023-04-30 ENCOUNTER — Telehealth: Payer: Self-pay

## 2023-04-30 NOTE — Telephone Encounter (Signed)
-----   Message from Cooper Render, CMA sent at 02/26/2023  5:16 PM EDT ----- Regarding: Previsit for colon 7-18 Call patient and let her know she has been scheduled for a "telephone" Previsit on 7-1 for her  colonoscopy at [facility] with [facility] on 06-07-23

## 2023-04-30 NOTE — Telephone Encounter (Signed)
Called patient but it just rings and rings.

## 2023-04-30 NOTE — Telephone Encounter (Signed)
Called patient but no answer.  Message sent via MyChart.

## 2023-05-02 NOTE — Telephone Encounter (Signed)
Called patient again but phone just rings and rings.  MyChart message has not been read.  Mailing letter to patient with appointment information for colonoscopy and Pre visit

## 2023-05-21 ENCOUNTER — Telehealth: Payer: Self-pay

## 2023-05-21 NOTE — Telephone Encounter (Signed)
Called patient at 10 & 10:15 with no answer.  No voicemail available.

## 2023-05-21 NOTE — Telephone Encounter (Signed)
Patient No Showed pre visit 05/21/23. Patient did not have voice mail and could not leave message.  Patient did not reschedule by 5pm, colonoscopy was cancelled.

## 2023-05-21 NOTE — Telephone Encounter (Signed)
Patient NO SHOW Pre visit 05/21/23

## 2023-05-23 ENCOUNTER — Telehealth: Payer: Self-pay

## 2023-05-23 NOTE — Telephone Encounter (Signed)
Patient NO SHOW pre visit on 05/21/23.  Pre visit cancelled and letter sent to patient.  Please cancel colonoscopy at [facility].

## 2023-05-23 NOTE — Telephone Encounter (Signed)
Hospital colonoscopy on 06/07/23 has been cancelled.

## 2023-06-04 ENCOUNTER — Emergency Department (HOSPITAL_COMMUNITY): Payer: 59

## 2023-06-04 ENCOUNTER — Other Ambulatory Visit: Payer: Self-pay

## 2023-06-04 ENCOUNTER — Emergency Department (HOSPITAL_COMMUNITY)
Admission: EM | Admit: 2023-06-04 | Discharge: 2023-06-04 | Disposition: A | Discharge status: Home / Self Care | Payer: 59 | Attending: Emergency Medicine | Admitting: Emergency Medicine

## 2023-06-04 ENCOUNTER — Encounter (HOSPITAL_COMMUNITY): Payer: Self-pay | Admitting: Emergency Medicine

## 2023-06-04 ENCOUNTER — Ambulatory Visit: Payer: Self-pay | Admitting: *Deleted

## 2023-06-04 DIAGNOSIS — Z7951 Long term (current) use of inhaled steroids: Secondary | ICD-10-CM | POA: Diagnosis not present

## 2023-06-04 DIAGNOSIS — I771 Stricture of artery: Secondary | ICD-10-CM | POA: Diagnosis not present

## 2023-06-04 DIAGNOSIS — J4521 Mild intermittent asthma with (acute) exacerbation: Secondary | ICD-10-CM | POA: Insufficient documentation

## 2023-06-04 DIAGNOSIS — I77810 Thoracic aortic ectasia: Secondary | ICD-10-CM | POA: Diagnosis not present

## 2023-06-04 DIAGNOSIS — Z7982 Long term (current) use of aspirin: Secondary | ICD-10-CM | POA: Insufficient documentation

## 2023-06-04 DIAGNOSIS — R059 Cough, unspecified: Secondary | ICD-10-CM | POA: Diagnosis not present

## 2023-06-04 DIAGNOSIS — R0602 Shortness of breath: Secondary | ICD-10-CM | POA: Diagnosis not present

## 2023-06-04 DIAGNOSIS — I110 Hypertensive heart disease with heart failure: Secondary | ICD-10-CM | POA: Insufficient documentation

## 2023-06-04 DIAGNOSIS — Z20822 Contact with and (suspected) exposure to covid-19: Secondary | ICD-10-CM | POA: Insufficient documentation

## 2023-06-04 DIAGNOSIS — I509 Heart failure, unspecified: Secondary | ICD-10-CM | POA: Diagnosis not present

## 2023-06-04 DIAGNOSIS — Z79899 Other long term (current) drug therapy: Secondary | ICD-10-CM | POA: Insufficient documentation

## 2023-06-04 LAB — TROPONIN I (HIGH SENSITIVITY)
Troponin I (High Sensitivity): 12 ng/L (ref <18)
Troponin I (High Sensitivity): 13 ng/L (ref <18)

## 2023-06-04 LAB — CBC
HCT: 46.7 % — ABNORMAL HIGH (ref 36.0–46.0)
Hemoglobin: 14.9 g/dL (ref 12.0–15.0)
MCH: 27.6 pg (ref 26.0–34.0)
MCHC: 31.9 g/dL (ref 30.0–36.0)
MCV: 86.5 fL (ref 80.0–100.0)
Platelets: 213 10*3/uL (ref 150–400)
RBC: 5.4 MIL/uL — ABNORMAL HIGH (ref 3.87–5.11)
RDW: 15.5 % (ref 11.5–15.5)
WBC: 3.8 10*3/uL — ABNORMAL LOW (ref 4.0–10.5)
nRBC: 0 % (ref 0.0–0.2)

## 2023-06-04 LAB — BASIC METABOLIC PANEL
Anion gap: 10 (ref 5–15)
BUN: 13 mg/dL (ref 6–20)
CO2: 22 mmol/L (ref 22–32)
Calcium: 9.2 mg/dL (ref 8.9–10.3)
Chloride: 106 mmol/L (ref 98–111)
Creatinine, Ser: 1.14 mg/dL — ABNORMAL HIGH (ref 0.44–1.00)
GFR, Estimated: 57 mL/min — ABNORMAL LOW (ref >60)
Glucose, Bld: 135 mg/dL — ABNORMAL HIGH (ref 70–99)
Potassium: 4.5 mmol/L (ref 3.5–5.1)
Sodium: 138 mmol/L (ref 135–145)

## 2023-06-04 LAB — RESP PANEL BY RT-PCR (RSV, FLU A&B, COVID)  RVPGX2
Influenza A by PCR: NEGATIVE
Influenza B by PCR: NEGATIVE
Resp Syncytial Virus by PCR: NEGATIVE
SARS Coronavirus 2 by RT PCR: NEGATIVE

## 2023-06-04 LAB — BRAIN NATRIURETIC PEPTIDE: B Natriuretic Peptide: 93 pg/mL (ref 0.0–100.0)

## 2023-06-04 MED ORDER — ALBUTEROL SULFATE HFA 108 (90 BASE) MCG/ACT IN AERS
2.0000 | INHALATION_SPRAY | RESPIRATORY_TRACT | Status: DC | PRN
  Administered 2023-06-04: 2 via RESPIRATORY_TRACT
  Filled 2023-06-04: qty 6.7

## 2023-06-04 MED ORDER — ALBUTEROL SULFATE (2.5 MG/3ML) 0.083% IN NEBU: 2.5000 mg | INHALATION_SOLUTION | RESPIRATORY_TRACT | 2 refills | Status: DC | PRN

## 2023-06-04 MED ORDER — PREDNISONE 10 MG PO TABS: 40.0000 mg | ORAL_TABLET | Freq: Every day | ORAL | 0 refills | Status: AC

## 2023-06-04 MED ORDER — ALBUTEROL SULFATE (2.5 MG/3ML) 0.083% IN NEBU
10.0000 mg/h | INHALATION_SOLUTION | Freq: Once | RESPIRATORY_TRACT | Status: AC
  Administered 2023-06-04: 10 mg/h via RESPIRATORY_TRACT
  Filled 2023-06-04: qty 12

## 2023-06-04 MED ORDER — PREDNISONE 20 MG PO TABS
60.0000 mg | ORAL_TABLET | Freq: Once | ORAL | Status: AC
  Administered 2023-06-04: 60 mg via ORAL
  Filled 2023-06-04: qty 3

## 2023-06-04 NOTE — ED Provider Notes (Signed)
[place] EMERGENCY DEPARTMENT AT [facility] Provider Note   CSN: [id number] Arrival date & time: 06/04/23  1054     History  Chief Complaint  Patient presents with  . Cough  . Shortness of Breath    Janet [name] is a 57 y.o. adult.   Cough Associated symptoms: shortness of breath   Shortness of Breath Associated symptoms: cough   Patient presents for cough and shortness of breath.  Medical history includes asthma, CHF, depression, HTN, prediabetes.  Over the past 3 days, patient has had shortness of breath, chest tightness, and cough productive of yellow sputum.  She has had a limited supply of her home albuterol nebulizer solution.  Because of this, she has been doing it approximately every 6-8 hours.  Symptoms have persisted, which prompted her to come to the ED today.  She denies any chest pain, fevers, chills.  Symptoms are reminiscent of prior asthma exacerbations.     Home Medications Prior to Admission medications   Medication Sig Start Date End Date Taking? Authorizing Provider  albuterol (PROVENTIL) (2.5 MG/3ML) 0.083% nebulizer solution Take 3 mLs (2.5 mg total) by nebulization every 4 (four) hours as needed for wheezing or shortness of breath. 01/16/23   Cathren Laine, MD  albuterol (PROVENTIL) (5 MG/ML) 0.5% nebulizer solution Take 0.5 mLs (2.5 mg total) by nebulization every 6 (six) hours as needed for wheezing or shortness of breath. 12/17/22   Radford Pax, NP  albuterol (VENTOLIN HFA) 108 (90 Base) MCG/ACT inhaler Inhale 1-2 puffs into the lungs every 6 (six) hours as needed for wheezing or shortness of breath. 12/27/22   Rema Fendt, NP  albuterol (VENTOLIN HFA) 108 (90 Base) MCG/ACT inhaler Inhale 2 puffs into the lungs every 4 (four) hours as needed for wheezing or shortness of breath. 01/16/23   Cathren Laine, MD  ARIPiprazole (ABILIFY) 10 MG tablet Take 10 mg by mouth daily. 01/04/23   [provider]  aspirin 81 MG chewable tablet  Chew 81 mg by mouth daily. aspirin 81 mg chewable tablet    [provider]  Budeson-Glycopyrrol-Formoterol (BREZTRI AEROSPHERE) 160-9-4.8 MCG/ACT AERO Inhale 2 puffs into the lungs in the morning and at bedtime. 06/08/21   Hunsucker, Lesia Sago, MD  carvedilol (COREG) 12.5 MG tablet Take 1 tablet (12.5 mg total) by mouth 2 (two) times daily. 10/27/22   Sabharwal, Aditya, DO  dapagliflozin propanediol (FARXIGA) 10 MG TABS tablet Take 1 tablet (10 mg total) by mouth daily. 04/12/23   Sabharwal, Aditya, DO  ENTRESTO 49-51 MG TAKE ONE TABLET BY MOUTH TWICE DAILY @ 9AM-5PM 01/24/23   Sabharwal, Aditya, DO  fluticasone (FLONASE) 50 MCG/ACT nasal spray Place 2 sprays into both nostrils daily. 01/18/23   Rema Fendt, NP  furosemide (LASIX) 20 MG tablet Take 1 tablet (20 mg total) by mouth daily. 09/01/22   Sabharwal, Aditya, DO  hydrOXYzine (VISTARIL) 50 MG capsule Take 50 mg by mouth 2 (two) times daily. 01/02/23   [provider]  montelukast (SINGULAIR) 10 MG tablet TAKE 1 TABLET(10 MG) BY MOUTH AT BEDTIME 03/30/23   Rema Fendt, NP  Multiple Vitamins-Minerals (ADULT ONE DAILY GUMMIES) CHEW Chew 2 tablets by mouth daily with breakfast.    [provider]  spironolactone (ALDACTONE) 25 MG tablet TAKE 1 TABLET(25 MG) BY MOUTH DAILY 04/12/23   Sabharwal, Aditya, DO  traZODone (DESYREL) 150 MG tablet Take 150 mg by mouth at bedtime.    [provider]  Allergies    Clarithromycin and Penicillins    Review of Systems   Review of Systems  Respiratory:  Positive for cough, chest tightness and shortness of breath.   All other systems reviewed and are negative.   Physical Exam Updated Vital Signs BP 112/85   Pulse 80   Temp 98.2 F (36.8 C) (Oral)   Resp 18   Ht 5\' 8"  (1.727 m)   Wt 131.1 kg   SpO2 97%   BMI 43.94 kg/m  Physical Exam Vitals and nursing note reviewed.  Constitutional:      General: She is not in acute distress.    Appearance: She is  well-developed. She is not ill-appearing, toxic-appearing or diaphoretic.  HENT:     Head: Normocephalic and atraumatic.     Mouth/Throat:     Mouth: Mucous membranes are moist.  Eyes:     Extraocular Movements: Extraocular movements intact.     Conjunctiva/sclera: Conjunctivae normal.  Cardiovascular:     Rate and Rhythm: Normal rate and regular rhythm.     Heart sounds: No murmur heard. Pulmonary:     Effort: Pulmonary effort is normal. No tachypnea, accessory muscle usage or respiratory distress.     Breath sounds: Wheezing present. No rhonchi or rales.  Chest:     Chest wall: No tenderness.  Abdominal:     Palpations: Abdomen is soft.     Tenderness: There is no abdominal tenderness.  Musculoskeletal:        General: No swelling.     Cervical back: Normal range of motion and neck supple.     Right lower leg: Edema present.     Left lower leg: Edema present.     Comments: Baseline edema in lower extremities  Skin:    General: Skin is warm and dry.     Coloration: Skin is not cyanotic or pale.  Neurological:     General: No focal deficit present.     Mental Status: She is alert and oriented to person, place, and time.  Psychiatric:        Mood and Affect: Mood normal.        Behavior: Behavior normal.     ED Results / Procedures / Treatments   Labs (all labs ordered are listed, but only abnormal results are displayed) Labs Reviewed  BASIC METABOLIC PANEL - Abnormal; Notable for the following components:      Result Value   Glucose, Bld 135 (*)    Creatinine, Ser 1.14 (*)    GFR, Estimated 57 (*)    All other components within normal limits  CBC - Abnormal; Notable for the following components:   WBC 3.8 (*)    RBC 5.40 (*)    HCT 46.7 (*)    All other components within normal limits  RESP PANEL BY RT-PCR (RSV, FLU A&B, COVID)  RVPGX2  BRAIN NATRIURETIC PEPTIDE  TROPONIN I (HIGH SENSITIVITY)  TROPONIN I (HIGH SENSITIVITY)    EKG EKG  Interpretation Date/Time:  Monday June 04 2023 11:32:14 EDT Ventricular Rate:  74 PR Interval:  160 QRS Duration:  104 QT Interval:  394 QTC Calculation: 437 R Axis:   -20  Text Interpretation: Sinus rhythm with Premature supraventricular complexes Incomplete right bundle branch block Septal infarct , age undetermined Confirmed by Gloris Manchester [phone]) on 06/04/2023 2:42:10 PM  Radiology DG Chest 2 View  Result Date: 06/04/2023 CLINICAL DATA:  Shortness of breath, cough EXAM: CHEST - 2 VIEW COMPARISON:  Previous studies including  the examination of 01/16/2023 FINDINGS: Transverse diameter of heart is slightly increased. Thoracic aorta is tortuous and ectatic. There are no signs of pulmonary edema or focal pulmonary consolidation. Small linear densities in left lower lung field may suggest minimal scarring or subsegmental atelectasis. There is no pleural effusion or pneumothorax. IMPRESSION: No active cardiopulmonary disease. Electronically Signed   By: Ernie Avena M.D.   On: 06/04/2023 12:46    Procedures Procedures    Medications Ordered in ED Medications  albuterol (VENTOLIN HFA) 108 (90 Base) MCG/ACT inhaler 2 puff (2 puffs Inhalation Given 06/04/23 1134)  albuterol (PROVENTIL) (2.5 MG/3ML) 0.083% nebulizer solution (has no administration in time range)  predniSONE (DELTASONE) tablet 60 mg (60 mg Oral Given 06/04/23 1454)    ED Course/ Medical Decision Making/ A&P Clinical Course as of 06/04/23 1633  Mon Jun 04, 2023  1553 Assumed care from Dr. Durwin Nora.  57 year old female with a history of asthma, CHF, and hypertension who presented to the emergency department with cough and wheezing.  Chest x-ray did not show any acute abnormalities.  Being treated for an asthma exacerbation at this time.  Will likely be able to be discharged if improved after nebulizers.  Troponin and BNP unremarkable.  COVID and flu in progress. [RP]    Clinical Course User Index [RP] Rondel Baton, MD                              Medical Decision Making Amount and/or Complexity of Data Reviewed Labs: ordered. Radiology: ordered.  Risk Prescription drug management.   This patient presents to the ED for concern of shortness of breath, this involves an extensive number of treatment options, and is a complaint that carries with it a high risk of complications and morbidity.  The differential diagnosis includes asthma exacerbation, pneumonia, URI, allergies, ACS, CHF   Co morbidities that complicate the patient evaluation  asthma, CHF, depression, HTN, prediabetes   Additional history obtained:  Additional history obtained from N/A External records from outside source obtained and reviewed including EMR   Lab Tests:  I Ordered, and personally interpreted labs.  The pertinent results include: Normal hemoglobin, slight leukopenia, normal troponin, normal electrolytes, baseline creatinine   Imaging Studies ordered:  I ordered imaging studies including chest x-ray  I independently visualized and interpreted imaging which showed no acute findings I agree with the radiologist interpretation   Cardiac Monitoring: / EKG:  The patient was maintained on a cardiac monitor.  I personally viewed and interpreted the cardiac monitored which showed an underlying rhythm of: Sinus rhythm  Problem List / ED Course / Critical interventions / Medication management  Patient presents for cough and shortness of breath over the past 3 days.  On arrival in the ED, vital signs, including SpO2 on room air are normal.  Patient is well-appearing on exam.  She has a significant bilateral lower extremity edema which she states is baseline for her.  She is able to speak in complete sentences.  She does not have increased work of breathing.  She does have diffuse wheezing on lung auscultation.  Patient was treated for asthma exacerbation with prednisone and continuous albuterol.  Workup, initiated prior to  patient was admitted in the ED, is reassuring.  BNP is pending at time of signout.  Patient will require reassessment prior to disposition.  Care patient was signed out to oncoming ED provider. I ordered medication including albuterol and prednisone for  asthma exacerbation Reevaluation of the patient after these medicines showed that the patient improved I have reviewed the patients home medicines and have made adjustments as needed   Social Determinants of Health:  Has access to outpatient care         Final Clinical Impression(s) / ED Diagnoses Final diagnoses:  Mild intermittent asthma with exacerbation    Rx / DC Orders ED Discharge Orders     None         Gloris Manchester, MD 06/04/23 [phone]

## 2023-06-04 NOTE — Telephone Encounter (Signed)
Patient in ED for evaluation

## 2023-06-04 NOTE — Telephone Encounter (Signed)
  Chief Complaint: audible wheezing , speaking causes worsening wheezing  Symptoms: see above  Frequency: couple of days  Pertinent Negatives: Patient denies chest pain  Disposition: [x] ED /[] Urgent Care (no appt availability in office) / [] Appointment(In office/virtual)/ []  [facility]/ [] Home Care/ [] Refused Recommended Disposition /[] [place] [facility]/ []  Follow-up with PCP Additional Notes:   Recommended patient go to ED now.  Call 911 if sx worsen. Has tried inhalers nebulizer's at home with no relief.   Reason for Disposition  [1] MODERATE difficulty breathing (e.g., speaks in phrases, SOB even at rest, pulse 100-120) AND [2] NEW-onset or WORSE than normal  Answer Assessment - Initial Assessment Questions 1. RESPIRATORY STATUS: "Describe your breathing?" (e.g., wheezing, shortness of breath, unable to speak, severe coughing)      Audible wheezing  2. ONSET: "When did this breathing problem begin?"      Couple of days ago  3. PATTERN "Does the difficult breathing come and go, or has it been constant since it started?"      Constant  4. SEVERITY: "How bad is your breathing?" (e.g., mild, moderate, severe)    - MILD: No SOB at rest, mild SOB with walking, speaks normally in sentences, can lie down, no retractions, pulse < 100.    - MODERATE: SOB at rest, SOB with minimal exertion and prefers to sit, cannot lie down flat, speaks in phrases, mild retractions, audible wheezing, pulse 100-120.    - SEVERE: Very SOB at rest, speaks in single words, struggling to breathe, sitting hunched forward, retractions, pulse > 120      na 5. RECURRENT SYMPTOM: "Have you had difficulty breathing before?" If Yes, ask: "When was the last time?" and "What happened that time?"      na 6. CARDIAC HISTORY: "Do you have any history of heart disease?" (e.g., heart attack, angina, bypass surgery, angioplasty)      See hx  7. LUNG HISTORY: "Do you have any history of lung disease?"  (e.g.,  pulmonary embolus, asthma, emphysema)     hx 8. CAUSE: "What do you think is causing the breathing problem?"      na 9. OTHER SYMPTOMS: "Do you have any other symptoms? (e.g., dizziness, runny nose, cough, chest pain, fever)     Cough, congestion audible wheezing  10. O2 SATURATION MONITOR:  "Do you use an oxygen saturation monitor (pulse oximeter) at home?" If Yes, ask: "What is your reading (oxygen level) today?" "What is your usual oxygen saturation reading?" (e.g., 95%)       na 11. PREGNANCY: "Is there any chance you are pregnant?" "When was your last menstrual period?"       na 12. TRAVEL: "Have you traveled out of the country in the last month?" (e.g., travel history, exposures)       na  Protocols used: Breathing Difficulty-A-AH

## 2023-06-04 NOTE — Telephone Encounter (Signed)
Call 911 to report to Emergency Department for immediate medical evaluation. Follow-up with Primary Care at hospital discharge.

## 2023-06-04 NOTE — ED Notes (Signed)
Dc instructions and scripts reviewed with pt no questions or concerns at this time. Will follow up with PCP

## 2023-06-04 NOTE — ED Triage Notes (Signed)
PT Presents POV with complaints of SOB, wheezing and productive cough x 2 days. Hx of Asthma had neb treatment at home at 830 this morning.

## 2023-06-04 NOTE — ED Provider Notes (Signed)
  Physical Exam  BP 112/85   Pulse 80   Temp 98.2 F (36.8 C) (Oral)   Resp 18   Ht 5\' 8"  (1.727 m)   Wt 131.1 kg   SpO2 97%   BMI 43.94 kg/m   Physical Exam  Procedures  Procedures  ED Course / MDM   Clinical Course as of 06/04/23 1719  Mon Jun 04, 2023  1553 Assumed care from Dr. Durwin Nora.  57 year old female with a history of asthma, CHF, and hypertension who presented to the emergency department with cough and wheezing.  Chest x-ray did not show any acute abnormalities.  Being treated for an asthma exacerbation at this time.  Will likely be able to be discharged if improved after nebulizers.  Troponin and BNP unremarkable.  COVID and flu in progress. [RP]  1713 Reassessed.  Patient reports that her breathing is much improved from prior.  Was requesting go home at this time.  Does still have some expiratory wheezing but is overall very well-appearing.  Was sent home with a nebulizer refill as well as prednisone and instructions to follow-up with her primary doctor in several days. [RP]    Clinical Course User Index [RP] Rondel Baton, MD   Medical Decision Making Amount and/or Complexity of Data Reviewed Labs: ordered. Radiology: ordered.  Risk Prescription drug management.      Rondel Baton, MD 06/04/23 [phone]

## 2023-06-04 NOTE — Discharge Instructions (Addendum)
A prescription was sent to your pharmacy for continued prednisone for the next 4 days.  Take as prescribed.  Refills for your nebulizer were also sent.  Continue breathing treatments as needed.  Return to the emergency department for any worsening symptoms.

## 2023-06-07 ENCOUNTER — Encounter (HOSPITAL_COMMUNITY): Payer: Self-pay

## 2023-06-07 ENCOUNTER — Ambulatory Visit (HOSPITAL_COMMUNITY): Admit: 2023-06-07 | Payer: 59 | Admitting: Gastroenterology

## 2023-06-07 SURGERY — COLONOSCOPY WITH PROPOFOL
Anesthesia: Monitor Anesthesia Care

## 2023-08-23 ENCOUNTER — Other Ambulatory Visit: Payer: Self-pay | Admitting: Family

## 2023-08-23 DIAGNOSIS — J4540 Moderate persistent asthma, uncomplicated: Secondary | ICD-10-CM

## 2023-08-23 NOTE — Telephone Encounter (Signed)
Requested Prescriptions  Pending Prescriptions Disp Refills   albuterol (VENTOLIN HFA) 108 (90 Base) MCG/ACT inhaler [Pharmacy Med Name: ALBUTEROL HFA INH (200 PUFFS) 8.5GM] 8.5 g 2    Sig: INHALE 1 TO 2 PUFFS INTO THE LUNGS EVERY 6 HOURS AS NEEDED FOR WHEEZING OR SHORTNESS OF BREATH     Pulmonology:  Beta Agonists 2 Passed - 08/23/2023 12:53 AM      Passed - Last BP in normal range    BP Readings from Last 1 Encounters:  06/04/23 115/78         Passed - Last Heart Rate in normal range    Pulse Readings from Last 1 Encounters:  06/04/23 75         Passed - Valid encounter within last 12 months    Recent Outpatient Visits           7 months ago Annual physical exam   [facility], Amy J, NP   1 year ago Lipoma of right upper extremity   [facility], Amy J, NP   2 years ago Moderate persistent asthma, unspecified whether complicated   [facility], [place], NP   2 years ago Annual physical exam   [facility], Amy J, NP   2 years ago Encounter to establish care   [facility], Salomon Fick, NP       Future Appointments             In 1 week Rema Fendt, NP [facility]

## 2023-09-03 ENCOUNTER — Ambulatory Visit (INDEPENDENT_AMBULATORY_CARE_PROVIDER_SITE_OTHER): Payer: 59 | Admitting: Family

## 2023-09-03 VITALS — BP 111/77 | HR 72 | Temp 98.4°F | Ht 68.0 in | Wt 274.0 lb

## 2023-09-03 DIAGNOSIS — Z23 Encounter for immunization: Secondary | ICD-10-CM

## 2023-09-03 DIAGNOSIS — Z1231 Encounter for screening mammogram for malignant neoplasm of breast: Secondary | ICD-10-CM | POA: Diagnosis not present

## 2023-09-03 DIAGNOSIS — J4540 Moderate persistent asthma, uncomplicated: Secondary | ICD-10-CM

## 2023-09-03 DIAGNOSIS — N1831 Chronic kidney disease, stage 3a: Secondary | ICD-10-CM | POA: Diagnosis not present

## 2023-09-03 DIAGNOSIS — E559 Vitamin D deficiency, unspecified: Secondary | ICD-10-CM | POA: Diagnosis not present

## 2023-09-03 DIAGNOSIS — R7303 Prediabetes: Secondary | ICD-10-CM

## 2023-09-03 MED ORDER — PREDNISONE 10 MG PO TABS
ORAL_TABLET | ORAL | 0 refills | Status: AC
Start: 2023-09-03 — End: 2023-09-09

## 2023-09-03 NOTE — Progress Notes (Signed)
Patient ID: ACADIA PAHLS, adult    DOB: 28-Feb-1966  MRN: [id number]  CC: Mammogram  Subjective: Janet [name] is a 57 y.o. adult who presents for mammogram.   Her concerns today include:  - Due for mammogram. - Established with Pulmonology for chronic conditions management. Next appointment December 2024. Reports she is doing well on regimen. However, recently wheezing. She denies red flag symptoms. Reports she took a breathing treatment this morning which helped. Reports she is no longer smoking. Requests Prednisone.  - Reports she was never able to establish with Nephrology and needs new referral.  - States she plans to schedule appointment for colonoscopy soon.  - Vitamin D screening.   Patient Active Problem List   Diagnosis Date Noted   Prediabetes 08/03/2022   Acute systolic heart failure (HCC)    Essential hypertension 02/17/2021   Asthma    Lymphedema    Elevated brain natriuretic peptide (BNP) level    DOE (dyspnea on exertion) 07/28/2020   Morbid obesity (HCC) 07/28/2020     Current Outpatient Medications on File Prior to Visit  Medication Sig Dispense Refill   albuterol (PROVENTIL) (2.5 MG/3ML) 0.083% nebulizer solution Take 3 mLs (2.5 mg total) by nebulization every 4 (four) hours as needed for wheezing or shortness of breath. 75 mL 2   albuterol (PROVENTIL) (5 MG/ML) 0.5% nebulizer solution Take 0.5 mLs (2.5 mg total) by nebulization every 6 (six) hours as needed for wheezing or shortness of breath. 20 mL 0   albuterol (VENTOLIN HFA) 108 (90 Base) MCG/ACT inhaler Inhale 2 puffs into the lungs every 4 (four) hours as needed for wheezing or shortness of breath. 1 each 1   albuterol (VENTOLIN HFA) 108 (90 Base) MCG/ACT inhaler INHALE 1 TO 2 PUFFS INTO THE LUNGS EVERY 6 HOURS AS NEEDED FOR WHEEZING OR SHORTNESS OF BREATH 8.5 g 2   ARIPiprazole (ABILIFY) 10 MG tablet Take 10 mg by mouth daily.     aspirin 81 MG chewable tablet Chew 81 mg by mouth daily. aspirin  81 mg chewable tablet     Budeson-Glycopyrrol-Formoterol (BREZTRI AEROSPHERE) 160-9-4.8 MCG/ACT AERO Inhale 2 puffs into the lungs in the morning and at bedtime. 10.7 g 5   carvedilol (COREG) 12.5 MG tablet Take 1 tablet (12.5 mg total) by mouth 2 (two) times daily. 180 tablet 3   dapagliflozin propanediol (FARXIGA) 10 MG TABS tablet Take 1 tablet (10 mg total) by mouth daily. 30 tablet 5   ENTRESTO 49-51 MG TAKE ONE TABLET BY MOUTH TWICE DAILY @ 9AM-5PM 180 tablet 11   fluticasone (FLONASE) 50 MCG/ACT nasal spray Place 2 sprays into both nostrils daily. 48 g 0   furosemide (LASIX) 20 MG tablet Take 1 tablet (20 mg total) by mouth daily. 30 tablet 5   hydrOXYzine (VISTARIL) 50 MG capsule Take 50 mg by mouth 2 (two) times daily.     montelukast (SINGULAIR) 10 MG tablet TAKE 1 TABLET(10 MG) BY MOUTH AT BEDTIME 90 tablet 3   Multiple Vitamins-Minerals (ADULT ONE DAILY GUMMIES) CHEW Chew 2 tablets by mouth daily with breakfast.     spironolactone (ALDACTONE) 25 MG tablet TAKE 1 TABLET(25 MG) BY MOUTH DAILY 90 tablet 3   traZODone (DESYREL) 150 MG tablet Take 150 mg by mouth at bedtime.     No current facility-administered medications on file prior to visit.    Allergies  Allergen Reactions   Clarithromycin Other (See Comments)    GI upset   Penicillins Rash, Hives and  Other (See Comments)    Social History   Socioeconomic History   Marital status: Single    Spouse name: Not on file   Number of children: 2   Years of education: 14   Highest education level: Associate degree: academic program  Occupational History   Occupation: Consulting civil engineer   Occupation: disabled  Tobacco Use   Smoking status: Never    Passive exposure: Current   Smokeless tobacco: Never  Vaping Use   Vaping status: Never Used  Substance and Sexual Activity   Alcohol use: Yes    Comment: 1 pint per week   Drug use: Yes    Types: Marijuana    Comment: uses marijuana twice a week   Sexual activity: Yes    Birth  control/protection: Surgical  Other Topics Concern   Not on file  Social History Narrative   Not on file   Social Determinants of Health   Financial Resource Strain: Low Risk  (09/03/2023)   Overall Financial Resource Strain (CARDIA)    Difficulty of Paying Living Expenses: Not hard at all  Food Insecurity: No Food Insecurity (09/03/2023)   Hunger Vital Sign    Worried About Running Out of Food in the Last Year: Never true    Ran Out of Food in the Last Year: Never true  Transportation Needs: No Transportation Needs (09/03/2023)   PRAPARE - Administrator, Civil Service (Medical): No    Lack of Transportation (Non-Medical): No  Physical Activity: Unknown (09/03/2023)   Exercise Vital Sign    Days of Exercise per Week: 0 days    Minutes of Exercise per Session: Not on file  Stress: Stress Concern Present (09/03/2023)   [employer] of Occupational Health - Occupational Stress Questionnaire    Feeling of Stress : To some extent  Social Connections: Socially Isolated (09/03/2023)   Social Connection and Isolation Panel [NHANES]    Frequency of Communication with Friends and Family: More than three times a week    Frequency of Social Gatherings with Friends and Family: Once a week    Attends Religious Services: Never    Database administrator or Organizations: No    Attends Engineer, structural: Not on file    Marital Status: Never married  Intimate Partner Violence: Not At Risk (01/15/2023)   Humiliation, Afraid, Rape, and Kick questionnaire    Fear of Current or Ex-Partner: No    Emotionally Abused: No    Physically Abused: No    Sexually Abused: No    Family History  Problem Relation Age of Onset   Colon polyps Mother    Hypertension Mother    Stroke Mother    Deep vein thrombosis Mother    Congestive Heart Failure Mother    Other Father        history unknown   Obesity Sister    Obesity Brother    Congestive Heart Failure Maternal  Grandmother    Colon cancer Neg Hx    Esophageal cancer Neg Hx    Rectal cancer Neg Hx    Stomach cancer Neg Hx     Past Surgical History:  Procedure Laterality Date   ABDOMINAL HYSTERECTOMY     BREAST BIOPSY Right    CESAREAN SECTION     HERNIA REPAIR     umbilical as a child   RIGHT/LEFT HEART CATH AND CORONARY ANGIOGRAPHY N/A 04/01/2021   Procedure: RIGHT/LEFT HEART CATH AND CORONARY ANGIOGRAPHY;  Surgeon: Corky Crafts, MD;  Location: [facility];  Service: Cardiovascular;  Laterality: N/A;   TUBAL LIGATION      ROS: Review of Systems Negative except as stated above  PHYSICAL EXAM: BP 111/77   Pulse 72   Temp 98.4 F (36.9 C) (Oral)   Ht 5\' 8"  (1.727 m)   Wt 274 lb (124.3 kg)   SpO2 92%   BMI 41.66 kg/m   Physical Exam HENT:     Head: Normocephalic and atraumatic.     Nose: Nose normal.     Mouth/Throat:     Mouth: Mucous membranes are moist.     Pharynx: Oropharynx is clear.  Eyes:     Extraocular Movements: Extraocular movements intact.     Conjunctiva/sclera: Conjunctivae normal.     Pupils: Pupils are equal, round, and reactive to light.  Cardiovascular:     Rate and Rhythm: Normal rate and regular rhythm.     Pulses: Normal pulses.     Heart sounds: Normal heart sounds.  Pulmonary:     Effort: Pulmonary effort is normal.     Breath sounds: Wheezing present.     Comments: Left upper and left lower normal. Right upper and right lower mild wheezing.  Musculoskeletal:        General: Normal range of motion.     Cervical back: Normal range of motion and neck supple.  Neurological:     General: No focal deficit present.     Mental Status: She is alert and oriented to person, place, and time.  Psychiatric:        Mood and Affect: Mood normal.        Behavior: Behavior normal.     ASSESSMENT AND PLAN: 1. Encounter for screening mammogram for malignant neoplasm of breast - Routine screening.  - MM Digital Screening; Future  2.  Moderate persistent asthma, unspecified whether complicated - Continue present management.  - Prednisone as prescribed. Counseled on medication adherence/adverse effects.  - Keep all scheduled appointments with Pulmonology. - Follow-up with primary provider as scheduled.  - predniSONE (DELTASONE) 10 MG tablet; Take 6 tablets (60 mg total) by mouth daily with breakfast for 1 day, THEN 5 tablets (50 mg total) daily with breakfast for 1 day, THEN 4 tablets (40 mg total) daily with breakfast for 1 day, THEN 3 tablets (30 mg total) daily with breakfast for 1 day, THEN 2 tablets (20 mg total) daily with breakfast for 1 day, THEN 1 tablet (10 mg total) daily with breakfast for 1 day.  Dispense: 21 tablet; Refill: 0  3. Stage 3a chronic kidney disease (HCC) - Referral to Nephrology for evaluation/management.  - Ambulatory referral to Nephrology  4. Prediabetes - Routine screening.  - Hemoglobin A1c  5. Vitamin D deficiency - Routine screening.  - Vitamin D, 25-hydroxy   Patient was given the opportunity to ask questions.  Patient verbalized understanding of the plan and was able to repeat key elements of the plan. Patient was given clear instructions to go to Emergency Department or return to medical center if symptoms don't improve, worsen, or new problems develop.The patient verbalized understanding.   Orders Placed This Encounter  Procedures   MM Digital Screening   Hemoglobin A1c   Vitamin D, 25-hydroxy   Ambulatory referral to Nephrology     Requested Prescriptions   Signed Prescriptions Disp Refills   predniSONE (DELTASONE) 10 MG tablet 21 tablet 0    Sig: Take 6 tablets (60 mg total) by mouth daily with breakfast  for 1 day, THEN 5 tablets (50 mg total) daily with breakfast for 1 day, THEN 4 tablets (40 mg total) daily with breakfast for 1 day, THEN 3 tablets (30 mg total) daily with breakfast for 1 day, THEN 2 tablets (20 mg total) daily with breakfast for 1 day, THEN 1 tablet (10  mg total) daily with breakfast for 1 day.    Follow-up with primary provider as scheduled.   Rema Fendt, NP

## 2023-09-03 NOTE — Progress Notes (Signed)
Patient asking for prednisone she states she is wheezing real bad. States no energy.  Needs for referral for mammogram.  Patient requestion Flu vaccine.

## 2023-09-04 ENCOUNTER — Other Ambulatory Visit: Payer: Self-pay | Admitting: Family

## 2023-09-04 DIAGNOSIS — E559 Vitamin D deficiency, unspecified: Secondary | ICD-10-CM | POA: Insufficient documentation

## 2023-09-04 LAB — VITAMIN D 25 HYDROXY (VIT D DEFICIENCY, FRACTURES): Vit D, 25-Hydroxy: 15.9 ng/mL — ABNORMAL LOW (ref 30.0–100.0)

## 2023-09-04 LAB — HEMOGLOBIN A1C
Est. average glucose Bld gHb Est-mCnc: 128 mg/dL
Hgb A1c MFr Bld: 6.1 % — ABNORMAL HIGH (ref 4.8–5.6)

## 2023-09-04 MED ORDER — VITAMIN D (ERGOCALCIFEROL) 1.25 MG (50000 UNIT) PO CAPS
50000.0000 [IU] | ORAL_CAPSULE | ORAL | 0 refills | Status: DC
Start: 2023-09-04 — End: 2023-10-15

## 2023-09-11 ENCOUNTER — Other Ambulatory Visit (HOSPITAL_COMMUNITY): Payer: Self-pay

## 2023-09-11 MED ORDER — CARVEDILOL 12.5 MG PO TABS: 12.5000 mg | ORAL_TABLET | Freq: Two times a day (BID) | ORAL | 0 refills | Status: DC

## 2023-09-12 ENCOUNTER — Other Ambulatory Visit (HOSPITAL_COMMUNITY): Payer: Self-pay | Admitting: Cardiology

## 2023-09-12 DIAGNOSIS — I428 Other cardiomyopathies: Secondary | ICD-10-CM

## 2023-09-12 DIAGNOSIS — I5020 Unspecified systolic (congestive) heart failure: Secondary | ICD-10-CM

## 2023-09-13 ENCOUNTER — Telehealth: Payer: Self-pay

## 2023-09-13 ENCOUNTER — Other Ambulatory Visit: Payer: Self-pay

## 2023-09-13 DIAGNOSIS — I509 Heart failure, unspecified: Secondary | ICD-10-CM

## 2023-09-13 DIAGNOSIS — Z1211 Encounter for screening for malignant neoplasm of colon: Secondary | ICD-10-CM

## 2023-09-13 NOTE — Telephone Encounter (Signed)
Called and spoke to patient. She cancelled her colonoscopy procedure scheduled for July 2024 and never rescheduled.  Rescheduled her for Thursday, 11-29-23 at 9:30am.  She was scheduled for a PV on Monday, 12-30 at 8:00AM. She was asked to arrive 10 minutes early for check in. Patient is not on Blood thinner. She understands she will need someone to bring her, stay with her and drive her home on the day of the procedure.

## 2023-09-19 NOTE — Progress Notes (Signed)
error 

## 2023-09-19 NOTE — Addendum Note (Signed)
Encounter addended by: Kieth Brightly, RMA on: 09/19/2023 11:08 AM  Actions taken: Clinical Note Signed

## 2023-09-24 ENCOUNTER — Telehealth: Payer: Self-pay | Admitting: Family

## 2023-09-24 NOTE — Telephone Encounter (Signed)
Patient called and stated that PCP Amy Zonia Kief NP has referred her to imaging to get a mammogram done and when imaging called her to schedule this mammogram, patient told them she had a lump in her left breast and imaging told the patient to call PCP office so PCP can order a different type of test. Please advise and follow back up with the patient @ # [phone].

## 2023-09-25 ENCOUNTER — Other Ambulatory Visit: Payer: Self-pay | Admitting: Family

## 2023-09-25 DIAGNOSIS — N6320 Unspecified lump in the left breast, unspecified quadrant: Secondary | ICD-10-CM

## 2023-09-25 DIAGNOSIS — Z1231 Encounter for screening mammogram for malignant neoplasm of breast: Secondary | ICD-10-CM

## 2023-09-25 DIAGNOSIS — N63 Unspecified lump in unspecified breast: Secondary | ICD-10-CM

## 2023-09-25 NOTE — Telephone Encounter (Signed)
Complete

## 2023-10-06 ENCOUNTER — Ambulatory Visit (INDEPENDENT_AMBULATORY_CARE_PROVIDER_SITE_OTHER): Payer: 59

## 2023-10-06 ENCOUNTER — Emergency Department (HOSPITAL_COMMUNITY)
Admission: EM | Admit: 2023-10-06 | Discharge: 2023-10-06 | Disposition: A | Discharge status: Home / Self Care | Payer: 59 | Attending: Emergency Medicine | Admitting: Emergency Medicine

## 2023-10-06 ENCOUNTER — Ambulatory Visit (HOSPITAL_COMMUNITY)
Admission: EM | Admit: 2023-10-06 | Discharge: 2023-10-06 | Disposition: A | Discharge status: Home / Self Care | Payer: 59 | Attending: Physician Assistant | Admitting: Physician Assistant

## 2023-10-06 ENCOUNTER — Encounter (HOSPITAL_COMMUNITY): Payer: Self-pay

## 2023-10-06 DIAGNOSIS — Z7982 Long term (current) use of aspirin: Secondary | ICD-10-CM | POA: Diagnosis not present

## 2023-10-06 DIAGNOSIS — R0602 Shortness of breath: Secondary | ICD-10-CM | POA: Diagnosis not present

## 2023-10-06 DIAGNOSIS — J4551 Severe persistent asthma with (acute) exacerbation: Secondary | ICD-10-CM

## 2023-10-06 DIAGNOSIS — J9601 Acute respiratory failure with hypoxia: Secondary | ICD-10-CM | POA: Diagnosis not present

## 2023-10-06 DIAGNOSIS — R0902 Hypoxemia: Secondary | ICD-10-CM | POA: Diagnosis not present

## 2023-10-06 DIAGNOSIS — J45909 Unspecified asthma, uncomplicated: Secondary | ICD-10-CM | POA: Diagnosis not present

## 2023-10-06 DIAGNOSIS — R600 Localized edema: Secondary | ICD-10-CM | POA: Diagnosis not present

## 2023-10-06 DIAGNOSIS — Z7951 Long term (current) use of inhaled steroids: Secondary | ICD-10-CM | POA: Insufficient documentation

## 2023-10-06 DIAGNOSIS — J40 Bronchitis, not specified as acute or chronic: Secondary | ICD-10-CM | POA: Diagnosis not present

## 2023-10-06 DIAGNOSIS — R112 Nausea with vomiting, unspecified: Secondary | ICD-10-CM | POA: Insufficient documentation

## 2023-10-06 DIAGNOSIS — R059 Cough, unspecified: Secondary | ICD-10-CM | POA: Diagnosis not present

## 2023-10-06 DIAGNOSIS — I509 Heart failure, unspecified: Secondary | ICD-10-CM | POA: Diagnosis not present

## 2023-10-06 LAB — CBC WITH DIFFERENTIAL/PLATELET
Abs Immature Granulocytes: 0.02 10*3/uL (ref 0.00–0.07)
Basophils Absolute: 0 10*3/uL (ref 0.0–0.1)
Basophils Relative: 1 %
Eosinophils Absolute: 0.3 10*3/uL (ref 0.0–0.5)
Eosinophils Relative: 5 %
HCT: 48.2 % — ABNORMAL HIGH (ref 36.0–46.0)
Hemoglobin: 15.1 g/dL — ABNORMAL HIGH (ref 12.0–15.0)
Immature Granulocytes: 0 %
Lymphocytes Relative: 26 %
Lymphs Abs: 1.7 10*3/uL (ref 0.7–4.0)
MCH: 27.2 pg (ref 26.0–34.0)
MCHC: 31.3 g/dL (ref 30.0–36.0)
MCV: 86.8 fL (ref 80.0–100.0)
Monocytes Absolute: 0.3 10*3/uL (ref 0.1–1.0)
Monocytes Relative: 4 %
Neutro Abs: 4.2 10*3/uL (ref 1.7–7.7)
Neutrophils Relative %: 64 %
Platelets: 231 10*3/uL (ref 150–400)
RBC: 5.55 MIL/uL — ABNORMAL HIGH (ref 3.87–5.11)
RDW: 15.4 % (ref 11.5–15.5)
WBC: 6.5 10*3/uL (ref 4.0–10.5)
nRBC: 0 % (ref 0.0–0.2)

## 2023-10-06 LAB — POC COVID19/FLU A&B COMBO
Covid Antigen, POC: NEGATIVE
Influenza A Antigen, POC: NEGATIVE
Influenza B Antigen, POC: NEGATIVE

## 2023-10-06 LAB — BRAIN NATRIURETIC PEPTIDE: B Natriuretic Peptide: 50.8 pg/mL (ref 0.0–100.0)

## 2023-10-06 LAB — BASIC METABOLIC PANEL
Anion gap: 10 (ref 5–15)
BUN: 19 mg/dL (ref 6–20)
CO2: 20 mmol/L — ABNORMAL LOW (ref 22–32)
Calcium: 9.1 mg/dL (ref 8.9–10.3)
Chloride: 107 mmol/L (ref 98–111)
Creatinine, Ser: 1.33 mg/dL — ABNORMAL HIGH (ref 0.44–1.00)
GFR, Estimated: 47 mL/min — ABNORMAL LOW (ref >60)
Glucose, Bld: 92 mg/dL (ref 70–99)
Potassium: 4.7 mmol/L (ref 3.5–5.1)
Sodium: 137 mmol/L (ref 135–145)

## 2023-10-06 MED ORDER — METHYLPREDNISOLONE SODIUM SUCC 125 MG IJ SOLR
INTRAMUSCULAR | Status: AC
  Filled 2023-10-06: qty 2

## 2023-10-06 MED ORDER — IPRATROPIUM-ALBUTEROL 0.5-2.5 (3) MG/3ML IN SOLN
3.0000 mL | Freq: Once | RESPIRATORY_TRACT | Status: AC
  Administered 2023-10-06: 3 mL via RESPIRATORY_TRACT

## 2023-10-06 MED ORDER — ALBUTEROL SULFATE (2.5 MG/3ML) 0.083% IN NEBU
2.5000 mg | INHALATION_SOLUTION | Freq: Once | RESPIRATORY_TRACT | Status: AC
  Administered 2023-10-06: 2.5 mg via RESPIRATORY_TRACT

## 2023-10-06 MED ORDER — PREDNISONE 20 MG PO TABS: 40.0000 mg | ORAL_TABLET | Freq: Every day | ORAL | 0 refills | Status: AC

## 2023-10-06 MED ORDER — METHYLPREDNISOLONE SODIUM SUCC 125 MG IJ SOLR
80.0000 mg | Freq: Once | INTRAMUSCULAR | Status: AC
  Administered 2023-10-06: 80 mg via INTRAMUSCULAR

## 2023-10-06 MED ORDER — ALBUTEROL SULFATE (2.5 MG/3ML) 0.083% IN NEBU
10.0000 mg/h | INHALATION_SOLUTION | Freq: Once | RESPIRATORY_TRACT | Status: AC
  Administered 2023-10-06: 10 mg/h via RESPIRATORY_TRACT
  Filled 2023-10-06: qty 3

## 2023-10-06 MED ORDER — IPRATROPIUM-ALBUTEROL 0.5-2.5 (3) MG/3ML IN SOLN
RESPIRATORY_TRACT | Status: AC
  Filled 2023-10-06: qty 3

## 2023-10-06 MED ORDER — DOXYCYCLINE HYCLATE 100 MG PO TABS: 100.0000 mg | ORAL_TABLET | Freq: Two times a day (BID) | ORAL | 0 refills | Status: AC

## 2023-10-06 MED ORDER — MAGNESIUM SULFATE 2 GM/50ML IV SOLN
2.0000 g | Freq: Once | INTRAVENOUS | Status: AC
  Administered 2023-10-06: 2 g via INTRAVENOUS
  Filled 2023-10-06: qty 50

## 2023-10-06 MED ORDER — ALBUTEROL SULFATE (2.5 MG/3ML) 0.083% IN NEBU
INHALATION_SOLUTION | RESPIRATORY_TRACT | Status: AC
Start: 2023-10-06 — End: ?
  Filled 2023-10-06: qty 3

## 2023-10-06 NOTE — ED Triage Notes (Signed)
Pt BIB [facility]. Pt has been Coughing 3-4 days. Urgent care did x-ray showed pneumonia. 80 IM solumedrol given at urgent care and 2 nebs. A&O X4. On 3L O2. Does not wear oxygen at home. VSS.

## 2023-10-06 NOTE — ED Provider Notes (Signed)
This is a 57 year old female with history of CHF, asthma here for productive cough, shortness of breath for 4 days.  Was seen urgent care and told she might have pneumonia and sent here due to oxygen saturation in the high 80s.  She is not normally on oxygen.  She has baseline lower extremity edema which is unchanged.  No chest pain.  Low suspicion for ACS or PE.  Plan at handoff was to reassess after albuterol and magnesium here likely discharge with azithromycin.  On reassessment she feels well.  Only mild to minimal wheeze.  She feels back to baseline.  I took her off of the oxygen she was placed on and she is satting 95% on room air.  She would like to go home.  Suspect component of bronchitis/reactive airway disease.  Ulcer with course of prednisone antibiotics.  Recommend close PCP follow.  Return precautions given.  Patient is comfortable this plan.     Laurence Spates, MD 10/06/23 1700

## 2023-10-06 NOTE — ED Notes (Signed)
Called ED charge nurse at [facility] and got VM.

## 2023-10-06 NOTE — ED Notes (Signed)
Patient is being discharged from the Urgent Care and sent to the Emergency Department via [facility] . Per Dorann Ou, PA, patient is in need of higher level of care due to hypoxia. Patient is aware and verbalizes understanding of plan of care.  Vitals:   10/06/23 1249 10/06/23 1310  BP:    Pulse: 73 80  Resp: 18   Temp:    SpO2: (!) 89% (!) 87%

## 2023-10-06 NOTE — ED Provider Notes (Signed)
[facility]    CSN: [id number] Arrival date & time: 10/06/23  1034      History   Chief Complaint Chief Complaint  Patient presents with   Cough   Shortness of Breath    HPI Janet [name] is a 57 y.o. female.   Patient presents today with a 3 to 4-day history of URI symptoms including congestion, cough, wheezing, shortness of breath.  She denies any fever, chest pain, vomiting.  She does have a history of asthma and has been hospitalized due to asthma with last episode several years ago.  Denies previous intubation related to asthma.  She has been using over-the-counter medication including Mucinex without improvement of symptoms.  Denies any recent antibiotics.  She was treated with prednisone taper a few months ago.  She has been using her albuterol nebulizer solution twice daily without improvement of symptoms.  She is on maintenance [employer] and reports compliance with this medicine.  She has had COVID several years ago.  She has had COVID-19 vaccinations.  Reports that her granddaughter was diagnosed with walking pneumonia and she had been around her.  Believes that this triggered her symptoms.  She denies any history of diabetes.    Past Medical History:  Diagnosis Date   Asthma    CHF (congestive heart failure) (HCC)    Congestive heart failure (CHF) (HCC)    Depression    Elevated brain natriuretic peptide (BNP) level    Hypertension    Lymphedema    SOB (shortness of breath)    Substance abuse [facility])     Patient Active Problem List   Diagnosis Date Noted   Vitamin D deficiency 09/04/2023   Prediabetes 08/03/2022   Acute systolic heart failure (HCC)    Essential hypertension 02/17/2021   Asthma    Lymphedema    Elevated brain natriuretic peptide (BNP) level    DOE (dyspnea on exertion) 07/28/2020   Morbid obesity (HCC) 07/28/2020    Past Surgical History:  Procedure Laterality Date   ABDOMINAL HYSTERECTOMY     BREAST BIOPSY Right     CESAREAN SECTION     HERNIA REPAIR     umbilical as a child   RIGHT/LEFT HEART CATH AND CORONARY ANGIOGRAPHY N/A 04/01/2021   Procedure: RIGHT/LEFT HEART CATH AND CORONARY ANGIOGRAPHY;  Surgeon: Corky Crafts, MD;  [facility];  Service: Cardiovascular;  Laterality: N/A;   TUBAL LIGATION      OB History   No obstetric history on file.      Home Medications    Prior to Admission medications   Medication Sig Start Date End Date Taking? Authorizing Provider  albuterol (PROVENTIL) (2.5 MG/3ML) 0.083% nebulizer solution Take 3 mLs (2.5 mg total) by nebulization every 4 (four) hours as needed for wheezing or shortness of breath. 06/04/23   Gloris Manchester, MD  albuterol (PROVENTIL) (5 MG/ML) 0.5% nebulizer solution Take 0.5 mLs (2.5 mg total) by nebulization every 6 (six) hours as needed for wheezing or shortness of breath. 12/17/22   Radford Pax, NP  albuterol (VENTOLIN HFA) 108 (90 Base) MCG/ACT inhaler Inhale 2 puffs into the lungs every 4 (four) hours as needed for wheezing or shortness of breath. 01/16/23   Cathren Laine, MD  albuterol (VENTOLIN HFA) 108 (90 Base) MCG/ACT inhaler INHALE 1 TO 2 PUFFS INTO THE LUNGS EVERY 6 HOURS AS NEEDED FOR WHEEZING OR SHORTNESS OF BREATH 08/23/23   Rema Fendt, NP  ARIPiprazole (ABILIFY) 10 MG  tablet Take 10 mg by mouth daily. 01/04/23   [provider]  aspirin 81 MG chewable tablet Chew 81 mg by mouth daily. aspirin 81 mg chewable tablet    [provider]  Budeson-Glycopyrrol-Formoterol (BREZTRI AEROSPHERE) 160-9-4.8 MCG/ACT AERO Inhale 2 puffs into the lungs in the morning and at bedtime. 06/08/21   Hunsucker, Lesia Sago, MD  carvedilol (COREG) 12.5 MG tablet Take 1 tablet (12.5 mg total) by mouth 2 (two) times daily. 09/11/23   Sabharwal, Aditya, DO  dapagliflozin propanediol (FARXIGA) 10 MG TABS tablet Take 1 tablet (10 mg total) by mouth daily. 04/12/23   Sabharwal, Aditya, DO  ENTRESTO 49-51 MG TAKE ONE  TABLET BY MOUTH TWICE DAILY @ 9AM-5PM 01/24/23   Sabharwal, Aditya, DO  fluticasone (FLONASE) 50 MCG/ACT nasal spray Place 2 sprays into both nostrils daily. 01/18/23   Rema Fendt, NP  furosemide (LASIX) 20 MG tablet TAKE ONE TABLET (20 MG) BY MOUTH DAILY AT 9AM 09/13/23   Sabharwal, Aditya, DO  hydrOXYzine (VISTARIL) 50 MG capsule Take 50 mg by mouth 2 (two) times daily. 01/02/23   [provider]  montelukast (SINGULAIR) 10 MG tablet TAKE 1 TABLET(10 MG) BY MOUTH AT BEDTIME 03/30/23   Rema Fendt, NP  Multiple Vitamins-Minerals (ADULT ONE DAILY GUMMIES) CHEW Chew 2 tablets by mouth daily with breakfast.    [provider]  spironolactone (ALDACTONE) 25 MG tablet TAKE 1 TABLET(25 MG) BY MOUTH DAILY 04/12/23   Sabharwal, Aditya, DO  traZODone (DESYREL) 150 MG tablet Take 150 mg by mouth at bedtime.    [provider]  Vitamin D, Ergocalciferol, (DRISDOL) 1.25 MG (50000 UNIT) CAPS capsule Take 1 capsule (50,000 Units total) by mouth every 7 (seven) days for 12 doses. 09/04/23 11/21/23  Rema Fendt, NP    Family History Family History  Problem Relation Age of Onset   Colon polyps Mother    Hypertension Mother    Stroke Mother    Deep vein thrombosis Mother    Congestive Heart Failure Mother    Other Father        history unknown   Obesity Sister    Obesity Brother    Congestive Heart Failure Maternal Grandmother    Colon cancer Neg Hx    Esophageal cancer Neg Hx    Rectal cancer Neg Hx    Stomach cancer Neg Hx     Social History Social History   Tobacco Use   Smoking status: Never    Passive exposure: Current   Smokeless tobacco: Never  Vaping Use   Vaping status: Never Used  Substance Use Topics   Alcohol use: Yes    Comment: 1 pint per week   Drug use: Yes    Types: Marijuana    Comment: uses marijuana twice a week     Allergies   Clarithromycin and Penicillins   Review of Systems Review of Systems  Constitutional:  Positive for  activity change. Negative for appetite change, fatigue and fever.  HENT:  Positive for congestion. Negative for sinus pressure, sneezing and sore throat.   Respiratory:  Positive for cough, shortness of breath and wheezing. Negative for chest tightness.   Cardiovascular:  Negative for chest pain.  Gastrointestinal:  Positive for nausea. Negative for abdominal pain, diarrhea and vomiting.  Neurological:  Negative for dizziness, light-headedness and headaches.     Physical Exam Triage Vital Signs ED Triage Vitals  Encounter Vitals Group     BP 10/06/23 1135 106/76  Systolic BP Percentile --      Diastolic BP Percentile --      Pulse Rate 10/06/23 1135 72     Resp 10/06/23 1135 18     Temp 10/06/23 1135 98.2 F (36.8 C)     Temp Source 10/06/23 1135 Oral     SpO2 10/06/23 1135 (!) 89 %     Weight --      Height --      Head Circumference --      Peak Flow --      Pain Score 10/06/23 1140 0     Pain Loc --      Pain Education --      Exclude from Growth Chart --    No data found.  Updated Vital Signs BP 106/76 (BP Location: Right Arm)   Pulse 80   Temp 98.2 F (36.8 C) (Oral)   Resp 18   SpO2 (!) 87%   Visual Acuity Right Eye Distance:   Left Eye Distance:   Bilateral Distance:    Right Eye Near:   Left Eye Near:    Bilateral Near:     Physical Exam Vitals reviewed.  Constitutional:      General: She is awake. She is not in acute distress.    Appearance: Normal appearance. She is well-developed. She is not ill-appearing.     Comments: Very pleasant female appears stated age in no acute distress sitting comfortably in exam room  HENT:     Head: Normocephalic and atraumatic.     Right Ear: Tympanic membrane, ear canal and external ear normal. Tympanic membrane is not erythematous or bulging.     Left Ear: Tympanic membrane, ear canal and external ear normal. Tympanic membrane is not erythematous or bulging.     Nose:     Right Sinus: No maxillary sinus  tenderness or frontal sinus tenderness.     Left Sinus: No maxillary sinus tenderness or frontal sinus tenderness.     Mouth/Throat:     Pharynx: Uvula midline. Posterior oropharyngeal erythema and postnasal drip present. No oropharyngeal exudate.  Cardiovascular:     Rate and Rhythm: Normal rate and regular rhythm.     Heart sounds: Normal heart sounds, S1 normal and S2 normal. No murmur heard. Pulmonary:     Effort: Pulmonary effort is normal.     Breath sounds: Wheezing present. No rhonchi or rales.     Comments: Widespread wheezing Psychiatric:        Behavior: Behavior is cooperative.      UC Treatments / Results  Labs (all labs ordered are listed, but only abnormal results are displayed) Labs Reviewed  POC COVID19/FLU A&B COMBO    EKG   Radiology DG Chest 2 View  Result Date: 10/06/2023 CLINICAL DATA:  57 year old female with history of cough and hypoxia. EXAM: CHEST - 2 VIEW COMPARISON:  Chest x-ray 06/04/2023. FINDINGS: Lung volumes are normal. No consolidative airspace disease. No pleural effusions. No pneumothorax. No pulmonary nodule or mass noted. Pulmonary vasculature and the cardiomediastinal silhouette are within normal limits. IMPRESSION: No radiographic evidence of acute cardiopulmonary disease. Electronically Signed   By: Trudie Reed M.D.   On: 10/06/2023 12:41    Procedures Procedures (including critical care time)  Medications Ordered in UC Medications  ipratropium-albuterol (DUONEB) 0.5-2.5 (3) MG/3ML nebulizer solution 3 mL (3 mLs Nebulization Given 10/06/23 1156)  methylPREDNISolone sodium succinate (SOLU-MEDROL) 125 mg/2 mL injection 80 mg (80 mg Intramuscular Given 10/06/23 1156)  albuterol (PROVENTIL) (  2.5 MG/3ML) 0.083% nebulizer solution 2.5 mg (2.5 mg Nebulization Given 10/06/23 1251)    Initial Impression / Assessment and Plan / UC Course  I have reviewed the triage vital signs and the nursing notes.  Pertinent labs & imaging results  that were available during my care of the patient were reviewed by me and considered in my medical decision making (see chart for details).     Patient was initially hypoxic on room air at 87% but then was placed on 3 L of oxygen and oxygen saturation improved to 93%.  On exam she had significant diffuse wheezing and so was given a DuoNeb and 80 mg of Solu-Medrol in clinic.  She reports feeling better and her oxygen saturation temporarily stabilized to around 92%.  After sitting quietly for several minutes her oxygen saturation dropped down into the 80s again so she was given a second breathing treatment in hopes that this would improve her oxygen saturation.  Unfortunately, it did not and after sitting quietly on room air for 10 minutes after the breathing treatment her oxygen saturation decreased to 87%.  Chest x-ray was obtained showed probable right lower lobe pneumonia.  COVID/flu testing was negative. Given her new oxygen requirement discussed that she would need to go to the emergency room and be transported by [facility].  She was agreeable.  She was stable at the time of discharge.  Final Clinical Impressions(s) / UC Diagnoses   Final diagnoses:  Acute respiratory failure with hypoxia (HCC)  Severe persistent asthma with exacerbation   Discharge Instructions   None    ED Prescriptions   None    PDMP not reviewed this encounter.   Jeani Hawking, PA-C 10/06/23 1314

## 2023-10-06 NOTE — ED Provider Notes (Signed)
Janet [name] EMERGENCY DEPARTMENT AT [facility] Provider Note   CSN: [id number] Arrival date & time: 10/06/23  1341     History  Chief Complaint  Patient presents with   Shortness of Breath    Janet [name] is a 57 y.o. female.  She has a history of congestive heart failure, asthma.  Has had cough productive of yellow sputum, shortness of breath for the last 4 days.  No fever.  She did have some nausea and a few episodes of vomiting.  Smokes marijuana no tobacco.  She went to urgent care today where she had a chest x-ray that showed a possible pneumonia, was given steroids and 2 breathing treatments.  Unfortunately her pulse ox dropped into the high 80s and so was placed on oxygen.  Transferred here for further evaluation.  She feels better on the oxygen.  She has chronic lymphedema and leg swelling, no leg pain.  The history is provided by the patient.  Cough Cough characteristics:  Productive Sputum characteristics:  Yellow Severity:  Moderate Onset quality:  Gradual Duration:  4 days Timing:  Intermittent Progression:  Worsening Smoker: yes   Relieved by:  Nothing Worsened by:  Activity Ineffective treatments:  Beta-agonist inhaler Associated symptoms: shortness of breath and wheezing   Associated symptoms: no chest pain and no fever        Home Medications Prior to Admission medications   Medication Sig Start Date End Date Taking? Authorizing Provider  albuterol (PROVENTIL) (2.5 MG/3ML) 0.083% nebulizer solution Take 3 mLs (2.5 mg total) by nebulization every 4 (four) hours as needed for wheezing or shortness of breath. 06/04/23   Gloris Manchester, MD  albuterol (PROVENTIL) (5 MG/ML) 0.5% nebulizer solution Take 0.5 mLs (2.5 mg total) by nebulization every 6 (six) hours as needed for wheezing or shortness of breath. 12/17/22   Radford Pax, NP  albuterol (VENTOLIN HFA) 108 (90 Base) MCG/ACT inhaler Inhale 2 puffs into the lungs every 4 (four) hours as needed for  wheezing or shortness of breath. 01/16/23   Cathren Laine, MD  albuterol (VENTOLIN HFA) 108 (90 Base) MCG/ACT inhaler INHALE 1 TO 2 PUFFS INTO THE LUNGS EVERY 6 HOURS AS NEEDED FOR WHEEZING OR SHORTNESS OF BREATH 08/23/23   Rema Fendt, NP  ARIPiprazole (ABILIFY) 10 MG tablet Take 10 mg by mouth daily. 01/04/23   [provider]  aspirin 81 MG chewable tablet Chew 81 mg by mouth daily. aspirin 81 mg chewable tablet    [provider]  Budeson-Glycopyrrol-Formoterol (BREZTRI AEROSPHERE) 160-9-4.8 MCG/ACT AERO Inhale 2 puffs into the lungs in the morning and at bedtime. 06/08/21   Hunsucker, Lesia Sago, MD  carvedilol (COREG) 12.5 MG tablet Take 1 tablet (12.5 mg total) by mouth 2 (two) times daily. 09/11/23   Sabharwal, Aditya, DO  dapagliflozin propanediol (FARXIGA) 10 MG TABS tablet Take 1 tablet (10 mg total) by mouth daily. 04/12/23   Sabharwal, Aditya, DO  ENTRESTO 49-51 MG TAKE ONE TABLET BY MOUTH TWICE DAILY @ 9AM-5PM 01/24/23   Sabharwal, Aditya, DO  fluticasone (FLONASE) 50 MCG/ACT nasal spray Place 2 sprays into both nostrils daily. 01/18/23   Rema Fendt, NP  furosemide (LASIX) 20 MG tablet TAKE ONE TABLET (20 MG) BY MOUTH DAILY AT 9AM 09/13/23   Sabharwal, Aditya, DO  hydrOXYzine (VISTARIL) 50 MG capsule Take 50 mg by mouth 2 (two) times daily. 01/02/23   [provider]  montelukast (SINGULAIR) 10 MG tablet TAKE 1 TABLET(10 MG) BY MOUTH  AT BEDTIME 03/30/23   Rema Fendt, NP  Multiple Vitamins-Minerals (ADULT ONE DAILY GUMMIES) CHEW Chew 2 tablets by mouth daily with breakfast.    [provider]  spironolactone (ALDACTONE) 25 MG tablet TAKE 1 TABLET(25 MG) BY MOUTH DAILY 04/12/23   Sabharwal, Aditya, DO  traZODone (DESYREL) 150 MG tablet Take 150 mg by mouth at bedtime.    [provider]  Vitamin D, Ergocalciferol, (DRISDOL) 1.25 MG (50000 UNIT) CAPS capsule Take 1 capsule (50,000 Units total) by mouth every 7 (seven) days for 12 doses.  09/04/23 11/21/23  Rema Fendt, NP      Allergies    Clarithromycin and Penicillins    Review of Systems   Review of Systems  Constitutional:  Negative for fever.  Eyes:  Negative for visual disturbance.  Respiratory:  Positive for cough, shortness of breath and wheezing.   Cardiovascular:  Positive for leg swelling. Negative for chest pain.    Physical Exam Updated Vital Signs BP 103/72 (BP Location: Right Arm)   Pulse 71   Temp 98 F (36.7 C) (Oral)   Resp 18   SpO2 95%  Physical Exam Vitals and nursing note reviewed.  Constitutional:      General: She is not in acute distress.    Appearance: She is well-developed.  HENT:     Head: Normocephalic and atraumatic.  Eyes:     Conjunctiva/sclera: Conjunctivae normal.  Cardiovascular:     Rate and Rhythm: Normal rate and regular rhythm.     Heart sounds: No murmur heard. Pulmonary:     Effort: Pulmonary effort is normal. No respiratory distress.     Breath sounds: Wheezing and rhonchi present.  Abdominal:     Palpations: Abdomen is soft.     Tenderness: There is no abdominal tenderness.  Musculoskeletal:        General: No swelling.     Cervical back: Neck supple.     Right lower leg: No tenderness. Edema present.     Left lower leg: No tenderness. Edema present.  Skin:    General: Skin is warm and dry.     Capillary Refill: Capillary refill takes less than 2 seconds.  Neurological:     General: No focal deficit present.     Mental Status: She is alert.     ED Results / Procedures / Treatments   Labs (all labs ordered are listed, but only abnormal results are displayed) Labs Reviewed  BASIC METABOLIC PANEL - Abnormal; Notable for the following components:      Result Value   CO2 20 (*)    Creatinine, Ser 1.33 (*)    GFR, Estimated 47 (*)    All other components within normal limits  CBC WITH DIFFERENTIAL/PLATELET - Abnormal; Notable for the following components:   RBC 5.55 (*)    Hemoglobin 15.1 (*)     HCT 48.2 (*)    All other components within normal limits  CULTURE, BLOOD (ROUTINE X 2)  CULTURE, BLOOD (ROUTINE X 2)  BRAIN NATRIURETIC PEPTIDE    EKG EKG Interpretation Date/Time:  Saturday October 06 2023 15:27:28 EST Ventricular Rate:  80 PR Interval:  158 QRS Duration:  92 QT Interval:  410 QTC Calculation: 472 R Axis:   -14  Text Interpretation: Sinus rhythm with Premature supraventricular complexes Incomplete right bundle branch block Moderate voltage criteria for LVH, may be normal variant ( R in aVL , Cornell product ) Nonspecific T wave abnormality Prolonged QT Abnormal ECG When  compared with ECG of 04-Jun-2023 11:32, No significant change since last tracing Confirmed by Meridee Score [phone]) on 10/06/2023 3:45:22 PM  Radiology DG Chest 2 View  Result Date: 10/06/2023 CLINICAL DATA:  57 year old female with history of cough and hypoxia. EXAM: CHEST - 2 VIEW COMPARISON:  Chest x-ray 06/04/2023. FINDINGS: Lung volumes are normal. No consolidative airspace disease. No pleural effusions. No pneumothorax. No pulmonary nodule or mass noted. Pulmonary vasculature and the cardiomediastinal silhouette are within normal limits. IMPRESSION: No radiographic evidence of acute cardiopulmonary disease. Electronically Signed   By: Trudie Reed M.D.   On: 10/06/2023 12:41    Procedures Procedures    Medications Ordered in ED Medications  magnesium sulfate IVPB 2 g 50 mL (has no administration in time range)  albuterol (PROVENTIL,VENTOLIN) solution continuous neb (has no administration in time range)    ED Course/ Medical Decision Making/ A&P Clinical Course as of 10/06/23 1707  Sat Oct 06, 2023  1538 RAD, lymphedema, cough x 4 days, negative CXR at UC, 88% RA there, got nebs here, mag, can maybe DC, maybe azithro [JD]    Clinical Course User Index [JD] Laurence Spates, MD                                 Medical Decision Making Amount and/or Complexity of Data  Reviewed Labs: ordered.  Risk Prescription drug management.   This patient complains of cough shortness of breath; this involves an extensive number of treatment Options and is a complaint that carries with it a high risk of complications and morbidity. The differential includes CHF, COPD, pneumonia, COVID, flu, asthma, bronchitis, PE  I ordered, reviewed and interpreted labs, which included CBC normal chemistries with mildly low bicarb elevated creatinine, BNP normal, blood culture sent I ordered medication IV magnesium, continuous albuterol neb and reviewed PMP when indicated. I independently    visualized and interpreted imaging which showed no acute infiltrate Additional history obtained from [facility] Previous records obtained and reviewed in epic including urgent care visit from today and recent ED notes Cardiac monitoring reviewed, sinus rhythm Social determinants considered, social isolation and stress Critical Interventions: None  After the interventions stated above, I reevaluated the patient and found patient to be breathing more comfortably Admission and further testing considered, her care is signed out to Dr. Earlene Plater to reassess and see if she is continuing to require oxygen if she does require oxygen she likely will need admission to the hospital for further management otherwise likely can be outpatient with steroids plus minus antibiotics.         Final Clinical Impression(s) / ED Diagnoses Final diagnoses:  Bronchitis    Rx / DC Orders ED Discharge Orders          Ordered    predniSONE (DELTASONE) 20 MG tablet  Daily        10/06/23 1701    doxycycline (VIBRA-TABS) 100 MG tablet  2 times daily        10/06/23 1701              Terrilee Files, MD 10/06/23 1710

## 2023-10-06 NOTE — ED Triage Notes (Signed)
Patient reports that she has had a productive cough with cream-colored sputum, SOB, and wheezing x 4 days.  Patient states she has been using her Albuterol nebulizer and off brand Mucinex.

## 2023-10-06 NOTE — ED Notes (Signed)
Called jamie at charge nurse number for [place]

## 2023-10-06 NOTE — Discharge Instructions (Signed)
Your x-ray today did not show any pneumonia.  Your lab work was reassuring.  You are being sent home on antibiotics and steroids.  You should use your inhaler as needed at home.  You should follow-up with your doctor early next week.  If you develop worsening difficulty breathing, chest pain or any other new concerning symptoms you should return to the ED.

## 2023-10-09 ENCOUNTER — Telehealth: Payer: Self-pay | Admitting: Family

## 2023-10-09 NOTE — Telephone Encounter (Signed)
Medication Refill -  Most Recent Primary Care Visit:  Provider: Ricky Stabs J  Department: [facility]  Visit Type: OFFICE VISIT  Date: 09/03/2023  Medication: predniSONE (DELTASONE) 20 MG tablet   Pt was prescribed medication at ED on 11/16. However, she stated she misplaced it. She is asking if the provider can send in a new RX; she had 4 tablets left. Please advise.   Has the patient contacted their pharmacy? No (Agent: If no, request that the patient contact the pharmacy for the refill. If patient does not wish to contact the pharmacy document the reason why and proceed with request.) (Agent: If yes, when and what did the pharmacy advise?)  Is this the correct pharmacy for this prescription? Yes If no, delete pharmacy and type the correct one.  This is the patient's preferred pharmacy:   [id number] - [place], [place] - [address] AT [facility] [address] [place] [address] Phone: [phone] Fax: [phone]    Has the prescription been filled recently? Yes  Is the patient out of the medication? Yes  Has the patient been seen for an appointment in the last year OR does the patient have an upcoming appointment? Yes  Can we respond through MyChart? Yes  Agent: Please be advised that Rx refills may take up to 3 business days. We ask that you follow-up with your pharmacy.

## 2023-10-11 ENCOUNTER — Ambulatory Visit
Admission: RE | Admit: 2023-10-11 | Discharge: 2023-10-11 | Disposition: A | Discharge status: Home / Self Care | Payer: 59 | Source: Ambulatory Visit | Attending: Family | Admitting: Family

## 2023-10-11 ENCOUNTER — Other Ambulatory Visit: Payer: Self-pay | Admitting: Family

## 2023-10-11 DIAGNOSIS — N6320 Unspecified lump in the left breast, unspecified quadrant: Secondary | ICD-10-CM

## 2023-10-11 DIAGNOSIS — Z1231 Encounter for screening mammogram for malignant neoplasm of breast: Secondary | ICD-10-CM

## 2023-10-11 DIAGNOSIS — N63 Unspecified lump in unspecified breast: Secondary | ICD-10-CM

## 2023-10-11 DIAGNOSIS — N6325 Unspecified lump in the left breast, overlapping quadrants: Secondary | ICD-10-CM | POA: Diagnosis not present

## 2023-10-11 DIAGNOSIS — N6322 Unspecified lump in the left breast, upper inner quadrant: Secondary | ICD-10-CM | POA: Diagnosis not present

## 2023-10-11 DIAGNOSIS — N6324 Unspecified lump in the left breast, lower inner quadrant: Secondary | ICD-10-CM | POA: Diagnosis not present

## 2023-10-12 ENCOUNTER — Telehealth: Payer: Self-pay | Admitting: Family

## 2023-10-12 NOTE — Telephone Encounter (Signed)
Patient dropped off document  [facility] Form , to be filled out by provider. Patient requested to send it back via Fax within 2-days. Document is located in providers tray at front office.Please fax signed form to [phone]

## 2023-10-15 ENCOUNTER — Ambulatory Visit (INDEPENDENT_AMBULATORY_CARE_PROVIDER_SITE_OTHER): Payer: 59 | Admitting: Family Medicine

## 2023-10-15 ENCOUNTER — Other Ambulatory Visit: Payer: Self-pay | Admitting: Family

## 2023-10-15 ENCOUNTER — Encounter: Payer: Self-pay | Admitting: Family Medicine

## 2023-10-15 VITALS — BP 105/71 | HR 83 | Temp 99.0°F | Resp 20 | Ht 68.0 in | Wt 260.6 lb

## 2023-10-15 DIAGNOSIS — J4541 Moderate persistent asthma with (acute) exacerbation: Secondary | ICD-10-CM | POA: Diagnosis not present

## 2023-10-15 DIAGNOSIS — Z09 Encounter for follow-up examination after completed treatment for conditions other than malignant neoplasm: Secondary | ICD-10-CM

## 2023-10-15 DIAGNOSIS — E66812 Obesity, class 2: Secondary | ICD-10-CM | POA: Diagnosis not present

## 2023-10-15 DIAGNOSIS — Z6839 Body mass index (BMI) 39.0-39.9, adult: Secondary | ICD-10-CM

## 2023-10-15 DIAGNOSIS — J40 Bronchitis, not specified as acute or chronic: Secondary | ICD-10-CM

## 2023-10-15 MED ORDER — PREDNISONE 20 MG PO TABS: 40.0000 mg | ORAL_TABLET | Freq: Every day | ORAL | 0 refills | Status: DC

## 2023-10-15 NOTE — Telephone Encounter (Signed)
Complete

## 2023-10-16 ENCOUNTER — Ambulatory Visit
Admission: RE | Admit: 2023-10-16 | Discharge: 2023-10-16 | Disposition: A | Discharge status: Home / Self Care | Payer: 59 | Source: Ambulatory Visit | Attending: Family | Admitting: Family

## 2023-10-16 ENCOUNTER — Encounter: Payer: Self-pay | Admitting: Family Medicine

## 2023-10-16 DIAGNOSIS — N6320 Unspecified lump in the left breast, unspecified quadrant: Secondary | ICD-10-CM

## 2023-10-16 DIAGNOSIS — N6324 Unspecified lump in the left breast, lower inner quadrant: Secondary | ICD-10-CM | POA: Diagnosis not present

## 2023-10-16 DIAGNOSIS — N6322 Unspecified lump in the left breast, upper inner quadrant: Secondary | ICD-10-CM | POA: Diagnosis not present

## 2023-10-16 DIAGNOSIS — N6012 Diffuse cystic mastopathy of left breast: Secondary | ICD-10-CM | POA: Diagnosis not present

## 2023-10-16 DIAGNOSIS — D0512 Intraductal carcinoma in situ of left breast: Secondary | ICD-10-CM | POA: Diagnosis not present

## 2023-10-16 DIAGNOSIS — D242 Benign neoplasm of left breast: Secondary | ICD-10-CM | POA: Diagnosis not present

## 2023-10-16 DIAGNOSIS — N6325 Unspecified lump in the left breast, overlapping quadrants: Secondary | ICD-10-CM | POA: Diagnosis not present

## 2023-10-16 DIAGNOSIS — N6022 Fibroadenosis of left breast: Secondary | ICD-10-CM | POA: Diagnosis not present

## 2023-10-16 HISTORY — PX: BREAST BIOPSY: SHX20

## 2023-10-16 NOTE — Progress Notes (Signed)
Established Patient Office Visit  Subjective    Patient ID: Janet [name], female    DOB: [birth year]  Age: 57 y.o. MRN: [id number]  CC:  Chief Complaint  Patient presents with   Hospitalization Follow-up    Stilling wheezing and coughing up yellow mucous, patient is still on oxygen    HPI Janet [name] presents for follow up of recent hospital discharge with diagnosis of bronchitis/RAD. Patient reports improvement since d/c.   Outpatient Encounter Medications as of 10/15/2023  Medication Sig   albuterol (PROVENTIL) (2.5 MG/3ML) 0.083% nebulizer solution Take 3 mLs (2.5 mg total) by nebulization every 4 (four) hours as needed for wheezing or shortness of breath.   albuterol (PROVENTIL) (5 MG/ML) 0.5% nebulizer solution Take 0.5 mLs (2.5 mg total) by nebulization every 6 (six) hours as needed for wheezing or shortness of breath.   albuterol (VENTOLIN HFA) 108 (90 Base) MCG/ACT inhaler Inhale 2 puffs into the lungs every 4 (four) hours as needed for wheezing or shortness of breath.   albuterol (VENTOLIN HFA) 108 (90 Base) MCG/ACT inhaler INHALE 1 TO 2 PUFFS INTO THE LUNGS EVERY 6 HOURS AS NEEDED FOR WHEEZING OR SHORTNESS OF BREATH   ARIPiprazole (ABILIFY) 10 MG tablet Take 10 mg by mouth daily.   aspirin 81 MG chewable tablet Chew 81 mg by mouth daily. aspirin 81 mg chewable tablet   Budeson-Glycopyrrol-Formoterol (BREZTRI AEROSPHERE) 160-9-4.8 MCG/ACT AERO Inhale 2 puffs into the lungs in the morning and at bedtime.   carvedilol (COREG) 12.5 MG tablet Take 1 tablet (12.5 mg total) by mouth 2 (two) times daily.   ENTRESTO 49-51 MG TAKE ONE TABLET BY MOUTH TWICE DAILY @ 9AM-5PM   fluticasone (FLONASE) 50 MCG/ACT nasal spray Place 2 sprays into both nostrils daily.   furosemide (LASIX) 20 MG tablet TAKE ONE TABLET (20 MG) BY MOUTH DAILY AT 9AM   hydrOXYzine (VISTARIL) 50 MG capsule Take 50 mg by mouth 2 (two) times daily.   montelukast (SINGULAIR) 10 MG tablet TAKE 1 TABLET(10  MG) BY MOUTH AT BEDTIME   Multiple Vitamins-Minerals (ADULT ONE DAILY GUMMIES) CHEW Chew 2 tablets by mouth daily with breakfast.   spironolactone (ALDACTONE) 25 MG tablet TAKE 1 TABLET(25 MG) BY MOUTH DAILY   traZODone (DESYREL) 150 MG tablet Take 150 mg by mouth at bedtime.   dapagliflozin propanediol (FARXIGA) 10 MG TABS tablet Take 1 tablet (10 mg total) by mouth daily.   [DISCONTINUED] predniSONE (DELTASONE) 20 MG tablet Take 2 tablets (40 mg total) by mouth daily with breakfast for 5 days.   [DISCONTINUED] Vitamin D, Ergocalciferol, (DRISDOL) 1.25 MG (50000 UNIT) CAPS capsule Take 1 capsule (50,000 Units total) by mouth every 7 (seven) days for 12 doses.   No facility-administered encounter medications on file as of 10/15/2023.    Past Medical History:  Diagnosis Date   Asthma    CHF (congestive heart failure) (HCC)    Congestive heart failure (CHF) (HCC)    Depression    Elevated brain natriuretic peptide (BNP) level    Hypertension    Lymphedema    SOB (shortness of breath)    Substance abuse (HCC)     Past Surgical History:  Procedure Laterality Date   ABDOMINAL HYSTERECTOMY     BREAST BIOPSY Right    CESAREAN SECTION     HERNIA REPAIR     umbilical as a child   RIGHT/LEFT HEART CATH AND CORONARY ANGIOGRAPHY N/A 04/01/2021   Procedure: RIGHT/LEFT HEART CATH AND CORONARY ANGIOGRAPHY;  Surgeon: Corky Crafts, MD;  Location: [facility] INVASIVE CV LAB;  Service: Cardiovascular;  Laterality: N/A;   TUBAL LIGATION      Family History  Problem Relation Age of Onset   Colon polyps Mother    Hypertension Mother    Stroke Mother    Deep vein thrombosis Mother    Congestive Heart Failure Mother    Other Father        history unknown   Obesity Sister    Obesity Brother    Congestive Heart Failure Maternal Grandmother    Colon cancer Neg Hx    Esophageal cancer Neg Hx    Rectal cancer Neg Hx    Stomach cancer Neg Hx     Social History   Socioeconomic History    Marital status: Single    Spouse name: Not on file   Number of children: 2   Years of education: 14   Highest education level: Associate degree: academic program  Occupational History   Occupation: Consulting civil engineer   Occupation: disabled  Tobacco Use   Smoking status: Never    Passive exposure: Current   Smokeless tobacco: Never  Vaping Use   Vaping status: Never Used  Substance and Sexual Activity   Alcohol use: Yes    Comment: 1 pint per week   Drug use: Yes    Types: Marijuana    Comment: uses marijuana twice a week   Sexual activity: Yes    Birth control/protection: Surgical  Other Topics Concern   Not on file  Social History Narrative   Not on file   Social Determinants of Health   Financial Resource Strain: Low Risk  (09/03/2023)   Overall Financial Resource Strain (CARDIA)    Difficulty of Paying Living Expenses: Not hard at all  Food Insecurity: No Food Insecurity (09/03/2023)   Hunger Vital Sign    Worried About Running Out of Food in the Last Year: Never true    Ran Out of Food in the Last Year: Never true  Transportation Needs: No Transportation Needs (09/03/2023)   PRAPARE - Administrator, Civil Service (Medical): No    Lack of Transportation (Non-Medical): No  Physical Activity: Unknown (09/03/2023)   Exercise Vital Sign    Days of Exercise per Week: 0 days    Minutes of Exercise per Session: Not on file  Stress: Stress Concern Present (09/03/2023)   [employer] of Occupational Health - Occupational Stress Questionnaire    Feeling of Stress : To some extent  Social Connections: Socially Isolated (09/03/2023)   Social Connection and Isolation Panel [NHANES]    Frequency of Communication with Friends and Family: More than three times a week    Frequency of Social Gatherings with Friends and Family: Once a week    Attends Religious Services: Never    Database administrator or Organizations: No    Attends Engineer, structural: Not on  file    Marital Status: Never married  Intimate Partner Violence: Not At Risk (01/15/2023)   Humiliation, Afraid, Rape, and Kick questionnaire    Fear of Current or Ex-Partner: No    Emotionally Abused: No    Physically Abused: No    Sexually Abused: No    Review of Systems  All other systems reviewed and are negative.       Objective    BP 105/71 (BP Location: Left Arm, Patient Position: Sitting, Cuff Size: Large)   Pulse 83   Temp 99 F (  37.2 C)   Resp 20   Ht 5\' 8"  (1.727 m)   Wt 260 lb 9.6 oz (118.2 kg)   SpO2 90%   BMI 39.62 kg/m   Physical Exam Vitals and nursing note reviewed.  Constitutional:      General: She is not in acute distress.    Appearance: She is obese.  Cardiovascular:     Rate and Rhythm: Normal rate and regular rhythm.  Pulmonary:     Effort: Pulmonary effort is normal.     Breath sounds: Normal breath sounds.  Abdominal:     Palpations: Abdomen is soft.     Tenderness: There is no abdominal tenderness.  Neurological:     General: No focal deficit present.     Mental Status: She is alert and oriented to person, place, and time.         Assessment & Plan:  1. Moderate persistent asthma with acute exacerbation Improving. Continue   2. Class 2 severe obesity due to excess calories with serious comorbidity and body mass index (BMI) of 39.0 to 39.9 in adult (HCC)   3. Hospital discharge follow-up  No follow-ups on file.   Tommie Raymond, MD

## 2023-10-17 LAB — SURGICAL PATHOLOGY

## 2023-10-22 ENCOUNTER — Encounter: Payer: Self-pay | Admitting: *Deleted

## 2023-10-25 ENCOUNTER — Telehealth: Payer: Self-pay

## 2023-10-25 NOTE — Telephone Encounter (Signed)
Transition Care Management Follow-up Telephone Call Date of discharge and from where: 10/06/2023 [facility] How have you been since you were released from the hospital? Patient stated she has completed her medication and feels much better. Any questions or concerns? No  Items Reviewed: Did the pt receive and understand the discharge instructions provided? Yes  Medications obtained and verified? Yes  Other? No  Any new allergies since your discharge? No  Dietary orders reviewed? Yes Do you have support at home? Yes   Follow up appointments reviewed:  PCP Hospital f/u appt confirmed? Yes  Scheduled to see Georganna Skeans, MD on 10/15/2023 @ [facility]. Specialist Hospital f/u appt confirmed? No  Scheduled to see  on  @ . Are transportation arrangements needed? No  If their condition worsens, is the pt aware to call PCP or go to the Emergency Dept.? Yes Was the patient provided with contact information for the PCP's office or ED? Yes Was to pt encouraged to call back with questions or concerns? Yes   Nashira Mcglynn [facility]  [facility], [facility] Guide Direct Dial: [phone]  Website: [email or web address]

## 2023-11-04 DIAGNOSIS — Z880 Allergy status to penicillin: Secondary | ICD-10-CM | POA: Diagnosis not present

## 2023-11-04 DIAGNOSIS — R0602 Shortness of breath: Secondary | ICD-10-CM | POA: Diagnosis not present

## 2023-11-04 DIAGNOSIS — Z881 Allergy status to other antibiotic agents status: Secondary | ICD-10-CM | POA: Diagnosis not present

## 2023-11-04 DIAGNOSIS — I110 Hypertensive heart disease with heart failure: Secondary | ICD-10-CM | POA: Diagnosis not present

## 2023-11-04 DIAGNOSIS — I5020 Unspecified systolic (congestive) heart failure: Secondary | ICD-10-CM | POA: Diagnosis not present

## 2023-11-04 DIAGNOSIS — Z1152 Encounter for screening for COVID-19: Secondary | ICD-10-CM | POA: Diagnosis not present

## 2023-11-04 DIAGNOSIS — Z7982 Long term (current) use of aspirin: Secondary | ICD-10-CM | POA: Diagnosis not present

## 2023-11-04 DIAGNOSIS — J984 Other disorders of lung: Secondary | ICD-10-CM | POA: Diagnosis not present

## 2023-11-04 DIAGNOSIS — Z79899 Other long term (current) drug therapy: Secondary | ICD-10-CM | POA: Diagnosis not present

## 2023-11-04 DIAGNOSIS — J45901 Unspecified asthma with (acute) exacerbation: Secondary | ICD-10-CM | POA: Diagnosis not present

## 2023-11-12 ENCOUNTER — Telehealth: Payer: Self-pay | Admitting: Family

## 2023-11-12 ENCOUNTER — Ambulatory Visit: Payer: Self-pay | Admitting: Surgery

## 2023-11-12 DIAGNOSIS — N6320 Unspecified lump in the left breast, unspecified quadrant: Secondary | ICD-10-CM

## 2023-11-12 DIAGNOSIS — N6321 Unspecified lump in the left breast, upper outer quadrant: Secondary | ICD-10-CM | POA: Diagnosis not present

## 2023-11-15 ENCOUNTER — Other Ambulatory Visit (HOSPITAL_COMMUNITY): Payer: Self-pay | Admitting: Cardiology

## 2023-11-15 ENCOUNTER — Other Ambulatory Visit (HOSPITAL_COMMUNITY): Payer: Self-pay

## 2023-11-15 DIAGNOSIS — I5020 Unspecified systolic (congestive) heart failure: Secondary | ICD-10-CM

## 2023-11-15 DIAGNOSIS — I428 Other cardiomyopathies: Secondary | ICD-10-CM

## 2023-11-15 MED ORDER — CARVEDILOL 12.5 MG PO TABS: 12.5000 mg | ORAL_TABLET | Freq: Two times a day (BID) | ORAL | 11 refills | Status: AC

## 2023-11-19 ENCOUNTER — Ambulatory Visit (AMBULATORY_SURGERY_CENTER): Payer: 59

## 2023-11-19 VITALS — Ht 68.0 in | Wt 265.4 lb

## 2023-11-19 DIAGNOSIS — Z1211 Encounter for screening for malignant neoplasm of colon: Secondary | ICD-10-CM

## 2023-11-19 MED ORDER — NA SULFATE-K SULFATE-MG SULF 17.5-3.13-1.6 GM/177ML PO SOLN: 1.0000 | Freq: Once | ORAL | 0 refills | Status: AC

## 2023-11-19 NOTE — Progress Notes (Addendum)
No egg or soy allergy known to patient  No issues known to pt with past sedation with any surgeries or procedures Patient denies ever being told they had issues or difficulty with intubation  No FH of Malignant Hyperthermia Pt is not on diet pills Pt is not on  home 02  Pt is not on blood thinners  Pt denies issues with constipation  No A fib or A flutter  Have any cardiac testing pending-- no  LOA: independent  Prep: suprep   PV competed with patient. Prep instructions sent via mychart aand hard copy given at [facility]. Goodrx coupon for provided to use for price reduction if needed.

## 2023-11-20 ENCOUNTER — Ambulatory Visit: Payer: Self-pay

## 2023-11-20 ENCOUNTER — Encounter: Payer: Self-pay | Admitting: Physician Assistant

## 2023-11-20 ENCOUNTER — Ambulatory Visit: Payer: 59 | Admitting: Physician Assistant

## 2023-11-20 VITALS — BP 92/72 | HR 74 | Ht 68.0 in | Wt 260.0 lb

## 2023-11-20 DIAGNOSIS — J209 Acute bronchitis, unspecified: Secondary | ICD-10-CM | POA: Diagnosis not present

## 2023-11-20 DIAGNOSIS — R197 Diarrhea, unspecified: Secondary | ICD-10-CM | POA: Diagnosis not present

## 2023-11-20 MED ORDER — BENZONATATE 100 MG PO CAPS: ORAL_CAPSULE | ORAL | 0 refills | Status: DC

## 2023-11-20 MED ORDER — AZITHROMYCIN 250 MG PO TABS: ORAL_TABLET | ORAL | 0 refills | Status: DC

## 2023-11-20 NOTE — Telephone Encounter (Signed)
     Chief Complaint: Non-productive cough, wheezing, SOB. Nebulizer helps for about 30 minutes. Symptoms: Above Frequency: 5 days ago Pertinent Negatives: Patient denies  Disposition: [] ED /[] Urgent Care (no appt availability in office) / [] Appointment(In office/virtual)/ []  [facility]/ [] Home Care/ [] Refused Recommended Disposition /[x] [facility]/ []  Follow-up with PCP Additional Notes: agrees with Mobile [facility].  Reason for Disposition  [1] MILD difficulty breathing (e.g., minimal/no SOB at rest, SOB with walking, pulse <100) AND [2] still present when not coughing  Answer Assessment - Initial Assessment Questions 1. ONSET: When did the cough begin?      5 days ago 2. SEVERITY: How bad is the cough today?      Severe 3. SPUTUM: Describe the color of your sputum (none, dry cough; clear, white, yellow, green)     None 4. HEMOPTYSIS: Are you coughing up any blood? If so ask: How much? (flecks, streaks, tablespoons, etc.)     No 5. DIFFICULTY BREATHING: Are you having difficulty breathing? If Yes, ask: How bad is it? (e.g., mild, moderate, severe)    - MILD: No SOB at rest, mild SOB with walking, speaks normally in sentences, can lie down, no retractions, pulse < 100.    - MODERATE: SOB at rest, SOB with minimal exertion and prefers to sit, cannot lie down flat, speaks in phrases, mild retractions, audible wheezing, pulse 100-120.    - SEVERE: Very SOB at rest, speaks in single words, struggling to breathe, sitting hunched forward, retractions, pulse > 120      Mild- moderate 6. FEVER: Do you have a fever? If Yes, ask: What is your temperature, how was it measured, and when did it start?     Chills, hot 7. CARDIAC HISTORY: Do you have any history of heart disease? (e.g., heart attack, congestive heart failure)      No 8. LUNG HISTORY: Do you have any history of lung disease?  (e.g., pulmonary embolus, asthma, emphysema)     Asthma 9. PE  RISK FACTORS: Do you have a history of blood clots? (or: recent major surgery, recent prolonged travel, bedridden)     No 10. OTHER SYMPTOMS: Do you have any other symptoms? (e.g., runny nose, wheezing, chest pain)       Wheezing 11. PREGNANCY: Is there any chance you are pregnant? When was your last menstrual period?       No 12. TRAVEL: Have you traveled out of the country in the last month? (e.g., travel history, exposures)       No  Protocols used: Cough - Acute Non-Productive-A-AH

## 2023-11-20 NOTE — Progress Notes (Signed)
 Established Patient Office Visit  Subjective   Patient ID: Janet [name], female    DOB: 15-Sep-1966  Age: 57 y.o. MRN: [id number]  Chief Complaint  Patient presents with   Cough   Wheezing      Discussed the use of AI scribe software for clinical note transcription with the patient, who gave verbal consent to proceed.  History of Present Illness       The patient, with a history of hypertension, presents with a five-day history of abdominal discomfort, diarrhea, and persistent cough. They believe the symptoms started after consuming a piece of brisket, suspecting food poisoning. The patient reports that the diarrhea was initially black and frequent, but has since improved, rating it a 4 out of 10 from a previous 10. The stool color has returned to a more normal shade, and the patient denies any foul odor.  The patient also reports a persistent cough, producing tan-colored, thick phlegm, sometimes yellowish. The cough is particularly bothersome at night, causing sleep disturbances for the past two nights. The patient completed a course of doxycycline  and prednisone  for a similar cough in December, prescribed during an ER visit in their hometown.  The patient also reports a decrease in appetite, having not eaten since the previous day. They have been sipping water, estimating an intake of about 16 ounces. The patient denies any current nausea but reports episodes of vomiting five days ago when the symptoms first started.  The patient is currently on blood pressure medication, specifically spironolactone , and reports that their blood pressure is usually well-controlled. They also mention an upcoming colonoscopy scheduled for January 9th.       Past Medical History:  Diagnosis Date   Asthma    CHF (congestive heart failure) (HCC)    Congestive heart failure (CHF) (HCC)    Depression    Elevated brain natriuretic peptide (BNP) level    Hypertension    Lymphedema    SOB  (shortness of breath)    Substance abuse (HCC)    Social History   Socioeconomic History   Marital status: Single    Spouse name: Not on file   Number of children: 2   Years of education: 14   Highest education level: Associate degree: academic program  Occupational History   Occupation: Consulting Civil Engineer   Occupation: disabled  Tobacco Use   Smoking status: Never    Passive exposure: Current   Smokeless tobacco: Never  Vaping Use   Vaping status: Never Used  Substance and Sexual Activity   Alcohol use: Yes    Comment: 1 pint per week   Drug use: Yes    Types: Marijuana    Comment: uses marijuana twice a week   Sexual activity: Yes    Birth control/protection: Surgical  Other Topics Concern   Not on file  Social History Narrative   Not on file   Social Drivers of Health   Financial Resource Strain: Low Risk  (09/03/2023)   Overall Financial Resource Strain (CARDIA)    Difficulty of Paying Living Expenses: Not hard at all  Food Insecurity: No Food Insecurity (09/03/2023)   Hunger Vital Sign    Worried About Running Out of Food in the Last Year: Never true    Ran Out of Food in the Last Year: Never true  Transportation Needs: No Transportation Needs (09/03/2023)   PRAPARE - Administrator, Civil Service (Medical): No    Lack of Transportation (Non-Medical): No  Physical Activity: Unknown (  09/03/2023)   Exercise Vital Sign    Days of Exercise per Week: 0 days    Minutes of Exercise per Session: Not on file  Stress: Stress Concern Present (09/03/2023)   [employer] of Occupational Health - Occupational Stress Questionnaire    Feeling of Stress : To some extent  Social Connections: Socially Isolated (09/03/2023)   Social Connection and Isolation Panel [NHANES]    Frequency of Communication with Friends and Family: More than three times a week    Frequency of Social Gatherings with Friends and Family: Once a week    Attends Religious Services: Never     Database Administrator or Organizations: No    Attends Engineer, Structural: Not on file    Marital Status: Never married  Intimate Partner Violence: Not At Risk (01/15/2023)   Humiliation, Afraid, Rape, and Kick questionnaire    Fear of Current or Ex-Partner: No    Emotionally Abused: No    Physically Abused: No    Sexually Abused: No   Family History  Problem Relation Age of Onset   Colon polyps Mother    Hypertension Mother    Stroke Mother    Deep vein thrombosis Mother    Congestive Heart Failure Mother    Other Father        history unknown   Obesity Sister    Obesity Brother    Congestive Heart Failure Maternal Grandmother    Colon cancer Neg Hx    Esophageal cancer Neg Hx    Rectal cancer Neg Hx    Stomach cancer Neg Hx    Allergies  Allergen Reactions   Clarithromycin Other (See Comments)    GI upset  Other Reaction(s): Other (See Comments)  Upset stomach, Cramps   Penicillins Rash, Hives and Other (See Comments)   Penicillin G Rash    Review of Systems  Constitutional:  Negative for chills and fever.  HENT: Negative.    Eyes: Negative.   Respiratory:  Positive for cough, sputum production and shortness of breath. Negative for wheezing.   Cardiovascular:  Negative for chest pain.  Gastrointestinal:  Positive for diarrhea. Negative for abdominal pain, nausea and vomiting.  Genitourinary:  Negative for dysuria and frequency.  Musculoskeletal: Negative.   Skin: Negative.   Neurological: Negative.   Endo/Heme/Allergies: Negative.   Psychiatric/Behavioral: Negative.        Objective:     BP 92/72 (BP Location: Left Arm, Patient Position: Sitting, Cuff Size: Large)   Pulse 74   Ht 5' 8 (1.727 m)   Wt 260 lb (117.9 kg)   BMI 39.53 kg/m  BP Readings from Last 3 Encounters:  11/20/23 92/72  10/15/23 105/71  10/06/23 111/66   Wt Readings from Last 3 Encounters:  11/20/23 260 lb (117.9 kg)  11/19/23 265 lb 6.4 oz (120.4 kg)  10/15/23 260  lb 9.6 oz (118.2 kg)    Physical Exam Vitals and nursing note reviewed.  Constitutional:      Appearance: Normal appearance.  HENT:     Head: Normocephalic and atraumatic.     Right Ear: External ear normal.     Left Ear: External ear normal.     Nose: Nose normal.     Mouth/Throat:     Mouth: Mucous membranes are moist.     Pharynx: Oropharynx is clear.  Eyes:     Extraocular Movements: Extraocular movements intact.     Conjunctiva/sclera: Conjunctivae normal.     Pupils: Pupils are equal,  round, and reactive to light.  Cardiovascular:     Rate and Rhythm: Normal rate and regular rhythm.     Pulses: Normal pulses.     Heart sounds: Normal heart sounds.  Pulmonary:     Effort: Pulmonary effort is normal.     Breath sounds: Normal breath sounds. No wheezing.  Musculoskeletal:        General: Normal range of motion.     Cervical back: Normal range of motion and neck supple.  Skin:    General: Skin is warm and dry.  Neurological:     General: No focal deficit present.     Mental Status: She is alert and oriented to person, place, and time.  Psychiatric:        Mood and Affect: Mood normal.        Behavior: Behavior normal.        Thought Content: Thought content normal.        Judgment: Judgment normal.        Assessment & Plan:   Problem List Items Addressed This Visit   None Visit Diagnoses       Acute bronchitis, unspecified organism    -  Primary   Relevant Medications   azithromycin  (ZITHROMAX ) 250 MG tablet   benzonatate  (TESSALON ) 100 MG capsule     Diarrhea, unspecified type       Relevant Orders   Basic metabolic panel (Completed)      1. Acute bronchitis, unspecified organism (Primary) Trial azithromycin , Tessalon  Perles.  Patient education given on supportive care.  Red flags given for prompt reevaluation. - azithromycin  (ZITHROMAX ) 250 MG tablet; Take 2 tabs PO day 1, then take 1 tab PO once daily  Dispense: 6 tablet; Refill: 0 - benzonatate   (TESSALON ) 100 MG capsule; Take 1-2 caps PO TID PRN  Dispense: 20 capsule; Refill: 0  2. Diarrhea, unspecified type Resolving, patient education given on supportive care, red flags for prompt reevaluation. - Basic metabolic panel Blood pressure is currently low, potentially due to dehydration from gastroenteritis. -Encourage increased fluid intake. -Check metabolic panel to assess potassium levels.   I have reviewed the patient's medical history (PMH, PSH, Social History, Family History, Medications, and allergies) , and have been updated if relevant. I spent 30 minutes reviewing chart and  face to face time with patient.     Return if symptoms worsen or fail to improve.    Kirk RAMAN Mayers, PA-C

## 2023-11-20 NOTE — Patient Instructions (Signed)
 I do encourage you to increase your hydration, and as able start to increase your caloric intake, eating a very gentle diet over the next couple of days.  To help with your bronchitis, you are going to use azithromycin  and I sent Tessalon  Perles to help with your cough.  I encourage you to use your breathing treatments on a scheduled basis over the next couple of days.  I hope that you feel better soon, please let us  know if there is anything else we can do for you  We will call you with today's lab results  Kirk CANDIE Sage, PA-C Physician Assistant [facility] [email or web address]  Diarrhea, Adult Diarrhea is frequent loose and sometimes watery bowel movements. Diarrhea can make you feel weak and cause you to become dehydrated. Dehydration is a condition in which there is not enough water or other fluids in the body. Dehydration can make you tired and thirsty, cause you to have a dry mouth, and decrease how often you urinate. Diarrhea typically lasts 2-3 days. However, it can last longer if it is a sign of something more serious. It is important to treat your diarrhea as told by your health care provider. Follow these instructions at home: Eating and drinking     Follow these recommendations as told by your health care provider: Take an oral rehydration solution (ORS). This is an over-the-counter medicine that helps return your body to its normal balance of nutrients and water. It is found at pharmacies and retail stores. Drink enough fluid to keep your urine pale yellow. Drink fluids such as water, diluted fruit juice, and low-calorie sports drinks. You can drink milk also, if desired. Sucking on ice chips is another way to get fluids. Avoid drinking fluids that contain a lot of sugar or caffeine, such as soda, energy drinks, and regular sports drinks. Avoid alcohol. Eat bland, easy-to-digest foods in small amounts as you are able.  These foods include bananas, applesauce, rice, lean meats, toast, and crackers. Avoid spicy or fatty foods.  Medicines Take over-the-counter and prescription medicines only as told by your health care provider. If you were prescribed antibiotics, take them as told by your health care provider. Do not stop using the antibiotic even if you start to feel better. General instructions  Wash your hands often using soap and water for at least 20 seconds. If soap and water are not available, use hand sanitizer. Others in the household should wash their hands as well. Hands should be washed: After using the toilet or changing a diaper. Before preparing, cooking, or serving food. While caring for a sick person or while visiting someone in a hospital. Rest at home while you recover. Take a warm bath to relieve any burning or pain from frequent diarrhea episodes. Watch your condition for any changes. Contact a health care provider if: You have a fever. Your diarrhea gets worse. You have new symptoms. You vomit every time you eat or drink. You feel light-headed, dizzy, or have a headache. You have muscle cramps. You have signs of dehydration, such as: Dark urine, very little urine, or no urine. Cracked lips. Dry mouth. Sunken eyes. Sleepiness. Weakness. You have bloody or black stools or stools that look like tar. You have severe pain, cramping, or bloating in your abdomen. Your skin feels cold and clammy. You feel confused. Get help right away if: You have chest pain or your heart is beating very quickly. You have trouble breathing or you are breathing very  quickly. You feel extremely weak or you faint. These symptoms may be an emergency. Get help right away. Call 911. Do not wait to see if the symptoms will go away. Do not drive yourself to the hospital. This information is not intended to replace advice given to you by your health care provider. Make sure you discuss any questions you  have with your health care provider. Document Revised: 04/25/2022 Document Reviewed: 04/25/2022 Elsevier Patient Education  2024 [employer].

## 2023-11-21 ENCOUNTER — Encounter: Payer: Self-pay | Admitting: Physician Assistant

## 2023-11-21 LAB — BASIC METABOLIC PANEL
BUN/Creatinine Ratio: 7 — ABNORMAL LOW (ref 9–23)
BUN: 7 mg/dL (ref 6–24)
CO2: 20 mmol/L (ref 20–29)
Calcium: 9.2 mg/dL (ref 8.7–10.2)
Chloride: 105 mmol/L (ref 96–106)
Creatinine, Ser: 1.02 mg/dL — ABNORMAL HIGH (ref 0.57–1.00)
Glucose: 113 mg/dL — ABNORMAL HIGH (ref 70–99)
Potassium: 3.6 mmol/L (ref 3.5–5.2)
Sodium: 143 mmol/L (ref 134–144)
eGFR: 65 mL/min/{1.73_m2} (ref >59)

## 2023-11-22 ENCOUNTER — Encounter (HOSPITAL_COMMUNITY): Payer: Self-pay | Admitting: Gastroenterology

## 2023-11-22 ENCOUNTER — Telehealth: Payer: Self-pay

## 2023-11-22 NOTE — Telephone Encounter (Signed)
   Called and spoke to patient.  She was agreeable to be rescheduled to Jan 20th, Monday at 7:30am for her colonoscopy at [facility] with Dr. Adela Lank.  New instructions will be sent to the patient.

## 2023-11-29 NOTE — Progress Notes (Signed)
 Allergies: PCN, clarithromycin  PLEASE CALL:    Patient aware.  []  Yes  []   No PHARMACY (reconcile your Meds and correct errors):  [phone] ADMITTING OFFICE: (complete your registration, Insurance info):                     [phone] or [phone]  DATE CALLED:                      TIME CALLED:                          Left Message: []  Yes    []   No, spoke to patient  PCP-  Greig Drones, NP Cardiologist-  Aleene Passe, MD  Pulmonology-  Donnice Beals, MD , Greig Drones, NP   EKG- 10-08-2023  Epic Echo-12-14-2022  Epic Cath-04-01-2021   R/LHC by Dr. Dann Last PFTs: 02-01-2023   Epic  ICD / PPM-    []  Yes    [x]   No     (no Device order needed, will use a Magnet) Spinal Stimulator   []  Yes    [x]   No   Blood thinner-  []  Yes    [x]   No   Name/ instruction:  ASA-   [x]  Yes    []   No         Instruction: GLP-1-   []  Yes    [x]   No      Instruction:   Medication  DOS:  Carvedilol , aripiprazole  (Abilify ),  Flonase  nasal spray and Albuterol  inhaler if needed.   Surgeon: Dr. Leigh Date of Endoscopy: 12-10-23 Time of Endoscopy: 0730   Arrival time:   0600         Patient aware  []   Yes    []  No Type of Procedure:    [x]  Colonoscopy w/ Propofol         []   Other:     Special Needs:   EF <35 % Need INTERPRETER:  [] Yes   [x]   No       LANGUAGE: _____      Name / phone Number of Designated Driver to Home:    Height   5' 8   Weight:    Did you receive instructions from the Surgeon's office?   [] Yes      []  No      What is your NPO status?     []   NPO MN      []    Other:      Did you receive the Bowel Prep from your Surgeon's office? []   Yes     []   No      Do you understand this information?   []   Yes    []    No         Medical / Surgical Hx: NICM, HFrEF, HTN, Pre-DM (no meds)  Sent for  Anesthesia Review: [x]   Yes   []   No

## 2023-12-10 ENCOUNTER — Ambulatory Visit (HOSPITAL_COMMUNITY): Payer: 59

## 2023-12-10 ENCOUNTER — Ambulatory Visit (HOSPITAL_COMMUNITY)
Admission: RE | Admit: 2023-12-10 | Discharge: 2023-12-10 | Disposition: A | Discharge status: Home / Self Care | Payer: 59 | Attending: Gastroenterology | Admitting: Gastroenterology

## 2023-12-10 ENCOUNTER — Other Ambulatory Visit: Payer: Self-pay

## 2023-12-10 ENCOUNTER — Encounter (HOSPITAL_COMMUNITY): Payer: Self-pay | Admitting: Gastroenterology

## 2023-12-10 ENCOUNTER — Encounter (HOSPITAL_COMMUNITY)
Admission: RE | Disposition: A | Discharge status: Home / Self Care | Payer: Self-pay | Source: Home / Self Care | Attending: Gastroenterology

## 2023-12-10 DIAGNOSIS — I110 Hypertensive heart disease with heart failure: Secondary | ICD-10-CM | POA: Diagnosis not present

## 2023-12-10 DIAGNOSIS — K635 Polyp of colon: Secondary | ICD-10-CM

## 2023-12-10 DIAGNOSIS — Z7722 Contact with and (suspected) exposure to environmental tobacco smoke (acute) (chronic): Secondary | ICD-10-CM | POA: Diagnosis not present

## 2023-12-10 DIAGNOSIS — K5730 Diverticulosis of large intestine without perforation or abscess without bleeding: Secondary | ICD-10-CM | POA: Insufficient documentation

## 2023-12-10 DIAGNOSIS — D122 Benign neoplasm of ascending colon: Secondary | ICD-10-CM

## 2023-12-10 DIAGNOSIS — D125 Benign neoplasm of sigmoid colon: Secondary | ICD-10-CM | POA: Diagnosis not present

## 2023-12-10 DIAGNOSIS — K648 Other hemorrhoids: Secondary | ICD-10-CM | POA: Insufficient documentation

## 2023-12-10 DIAGNOSIS — D123 Benign neoplasm of transverse colon: Secondary | ICD-10-CM

## 2023-12-10 DIAGNOSIS — I509 Heart failure, unspecified: Secondary | ICD-10-CM | POA: Diagnosis not present

## 2023-12-10 DIAGNOSIS — Z8249 Family history of ischemic heart disease and other diseases of the circulatory system: Secondary | ICD-10-CM | POA: Insufficient documentation

## 2023-12-10 DIAGNOSIS — Z1211 Encounter for screening for malignant neoplasm of colon: Secondary | ICD-10-CM

## 2023-12-10 DIAGNOSIS — D128 Benign neoplasm of rectum: Secondary | ICD-10-CM | POA: Insufficient documentation

## 2023-12-10 DIAGNOSIS — J45909 Unspecified asthma, uncomplicated: Secondary | ICD-10-CM | POA: Insufficient documentation

## 2023-12-10 DIAGNOSIS — Q439 Congenital malformation of intestine, unspecified: Secondary | ICD-10-CM | POA: Diagnosis not present

## 2023-12-10 DIAGNOSIS — D126 Benign neoplasm of colon, unspecified: Secondary | ICD-10-CM

## 2023-12-10 DIAGNOSIS — I5021 Acute systolic (congestive) heart failure: Secondary | ICD-10-CM | POA: Diagnosis not present

## 2023-12-10 HISTORY — PX: HEMOSTASIS CLIP PLACEMENT: SHX6857

## 2023-12-10 HISTORY — PX: POLYPECTOMY: SHX5525

## 2023-12-10 HISTORY — PX: COLONOSCOPY WITH PROPOFOL: SHX5780

## 2023-12-10 SURGERY — COLONOSCOPY WITH PROPOFOL
Anesthesia: Monitor Anesthesia Care

## 2023-12-10 MED ORDER — PROPOFOL 10 MG/ML IV BOLUS
INTRAVENOUS | Status: DC | PRN
  Administered 2023-12-10: 100 ug/kg/min via INTRAVENOUS

## 2023-12-10 MED ORDER — PROPOFOL 1000 MG/100ML IV EMUL
INTRAVENOUS | Status: AC
  Filled 2023-12-10: qty 100

## 2023-12-10 MED ORDER — LIDOCAINE HCL (CARDIAC) PF 100 MG/5ML IV SOSY
PREFILLED_SYRINGE | INTRAVENOUS | Status: DC | PRN
  Administered 2023-12-10: 50 mg via INTRAVENOUS

## 2023-12-10 MED ORDER — SODIUM CHLORIDE 0.9 % IV SOLN: INTRAVENOUS | Status: DC

## 2023-12-10 SURGICAL SUPPLY — 21 items
ELECT REM PT RETURN 9FT ADLT (ELECTROSURGICAL)
ELECTRODE REM PT RTRN 9FT ADLT (ELECTROSURGICAL) IMPLANT
FCP BXJMBJMB 240X2.8X (CUTTING FORCEPS)
FLOOR PAD 36X40 (MISCELLANEOUS) ×2
FORCEPS BIOP RAD 4 LRG CAP 4 (CUTTING FORCEPS) IMPLANT
FORCEPS BIOP RJ4 240 W/NDL (CUTTING FORCEPS)
FORCEPS BXJMBJMB 240X2.8X (CUTTING FORCEPS) IMPLANT
INJECTOR/SNARE I SNARE (MISCELLANEOUS) IMPLANT
LUBRICANT JELLY 4.5OZ STERILE (MISCELLANEOUS) IMPLANT
MANIFOLD NEPTUNE II (INSTRUMENTS) IMPLANT
NDL SCLEROTHERAPY 25GX240 (NEEDLE) IMPLANT
NEEDLE SCLEROTHERAPY 25GX240 (NEEDLE) IMPLANT
PAD FLOOR 36X40 (MISCELLANEOUS) ×2 IMPLANT
PROBE APC STR FIRE (PROBE) IMPLANT
PROBE INJECTION GOLD 7FR (MISCELLANEOUS) IMPLANT
SNARE ROTATE MED OVAL 20MM (MISCELLANEOUS) IMPLANT
SYR 50ML LL SCALE MARK (SYRINGE) IMPLANT
TRAP SPECIMEN MUCOUS 40CC (MISCELLANEOUS) IMPLANT
TUBING ENDO SMARTCAP PENTAX (MISCELLANEOUS) IMPLANT
TUBING IRRIGATION ENDOGATOR (MISCELLANEOUS) ×2 IMPLANT
WATER STERILE IRR 1000ML POUR (IV SOLUTION) IMPLANT

## 2023-12-10 NOTE — H&P (Signed)
[facility] History and Physical   Primary Care Physician:  Janet Fendt, [name]   Reason for Procedure:   Colon cancer screening  Plan:    colonoscopy     HPI: Janet [name] is a 58 y.o. female  here for colonoscopy screening - first time exam. Patient denies any bowel symptoms at this time. No family history of colon cancer known. Otherwise feels well without any cardiopulmonary symptoms.   She has a history of CHF, last echo 11/2022 - EF 30-35%. Denies any recent problems with her heart. Case done at the hospital for anesthesia support. She denies any significant changes to her medical history since I have last seen her in the office.   I have discussed risks / benefits of anesthesia and endoscopic procedure with Janet [name] and they wish to proceed with the exams as outlined today.    Past Medical History:  Diagnosis Date   Asthma    CHF (congestive heart failure) (HCC)    Congestive heart failure (CHF) (HCC)    Depression    Elevated brain natriuretic peptide (BNP) level    Hypertension    Lymphedema    SOB (shortness of breath)    Substance abuse (HCC)     Past Surgical History:  Procedure Laterality Date   ABDOMINAL HYSTERECTOMY     BREAST BIOPSY Right    BREAST BIOPSY Left 10/16/2023   times 3   BREAST BIOPSY Left 10/16/2023   [place] LT BREAST BX W LOC DEV EA ADD LESION IMG BX SPEC US GUIDE 10/16/2023 [facility]   BREAST BIOPSY Left 10/16/2023   [place] LT BREAST BX W LOC DEV 1ST LESION IMG BX SPEC US GUIDE 10/16/2023 [facility]   BREAST BIOPSY Left 10/16/2023   [place] LT BREAST BX W LOC DEV EA ADD LESION IMG BX SPEC US GUIDE 10/16/2023 [facility]   CESAREAN SECTION     HERNIA REPAIR     umbilical as a child   RIGHT/LEFT HEART CATH AND CORONARY ANGIOGRAPHY N/A 04/01/2021   Procedure: RIGHT/LEFT HEART CATH AND CORONARY ANGIOGRAPHY;  Surgeon: Janet Crafts, Janet [name];  Location: [facility];  Service: Cardiovascular;   Laterality: N/A;   TUBAL LIGATION      Prior to Admission medications   Medication Sig Start Date End Date Taking? Authorizing Provider  albuterol (PROVENTIL) (2.5 MG/3ML) 0.083% nebulizer solution Take 3 mLs (2.5 mg total) by nebulization every 4 (four) hours as needed for wheezing or shortness of breath. 06/04/23  Yes Janet Manchester, Janet [name]  albuterol (PROVENTIL) (5 MG/ML) 0.5% nebulizer solution Take 0.5 mLs (2.5 mg total) by nebulization every 6 (six) hours as needed for wheezing or shortness of breath. 12/17/22  Yes Janet Pax, [name]  albuterol (VENTOLIN HFA) 108 (90 Base) MCG/ACT inhaler Inhale 2 puffs into the lungs every 4 (four) hours as needed for wheezing or shortness of breath. 01/16/23  Yes Janet Laine, Janet [name]  albuterol (VENTOLIN HFA) 108 (90 Base) MCG/ACT inhaler INHALE 1 TO 2 PUFFS INTO THE LUNGS EVERY 6 HOURS AS NEEDED FOR WHEEZING OR SHORTNESS OF BREATH 08/23/23  Yes Janet [name], Janet [name]  ARIPiprazole (ABILIFY) 10 MG tablet Take 10 mg by mouth daily. 01/04/23  Yes Provider, Historical, Janet [name]  aspirin 81 MG chewable tablet Chew 81 mg by mouth daily. aspirin 81 mg chewable tablet   Yes Provider, Historical, Janet [name]  azithromycin (ZITHROMAX) 250 MG tablet Take 2 tabs PO day 1, then take 1 tab PO once daily 11/20/23  Yes Mayers, Cari [name], Janet [name]  benzonatate (TESSALON) 100 MG capsule Take 1-2 caps PO TID PRN 11/20/23  Yes Mayers, Cari [name], Janet [name]  Budeson-Glycopyrrol-Formoterol (BREZTRI AEROSPHERE) 160-9-4.8 MCG/ACT AERO Inhale 2 puffs into the lungs in the morning and at bedtime. 06/08/21  Yes Hunsucker, Lesia Sago, Janet [name]  carvedilol (COREG) 12.5 MG tablet Take 1 tablet (12.5 mg total) by mouth 2 (two) times daily with a meal. 11/15/23  Yes Janet [name]  ENTRESTO 49-51 MG TAKE ONE TABLET BY MOUTH TWICE DAILY @ 9AM-5PM 01/24/23  Yes Janet [name]  fluticasone (FLONASE) 50 MCG/ACT nasal spray Place 2 sprays into both nostrils daily. 01/18/23  Yes Janet [name], Janet [name]  furosemide (LASIX) 20 MG tablet TAKE ONE  TABLET (20 MG) BY MOUTH DAILY AT 9AM 11/15/23  Yes Janet [name]  hydrOXYzine (VISTARIL) 50 MG capsule Take 50 mg by mouth 2 (two) times daily. 01/02/23  Yes Provider, Historical, Janet [name]  montelukast (SINGULAIR) 10 MG tablet TAKE 1 TABLET(10 MG) BY MOUTH AT BEDTIME 03/30/23  Yes Janet [name], Janet [name]  Multiple Vitamins-Minerals (ADULT ONE DAILY GUMMIES) CHEW Chew 2 tablets by mouth daily with breakfast.   Yes Provider, Historical, Janet [name]  spironolactone (ALDACTONE) 25 MG tablet TAKE 1 TABLET(25 MG) BY MOUTH DAILY 04/12/23  Yes Janet [name]  traZODone (DESYREL) 150 MG tablet Take 150 mg by mouth at bedtime.   Yes Provider, Historical, Janet [name]    Current Facility-Administered Medications  Medication Dose Route Frequency Provider Last Rate Last Admin   0.9 %  sodium chloride infusion   Intravenous Continuous Janet [name]        Allergies as of 09/13/2023 - Review Complete 09/03/2023  Allergen Reaction Noted   Clarithromycin Other (See Comments) 01/24/2020   Penicillins Rash, Hives, and Other (See Comments) 01/24/2020    Family History  Problem Relation Age of Onset   Colon polyps Mother    Hypertension Mother    Stroke Mother    Deep vein thrombosis Mother    Congestive Heart Failure Mother    Other Father        history unknown   Obesity Sister    Obesity Brother    Congestive Heart Failure Maternal Grandmother    Colon cancer Neg Hx    Esophageal cancer Neg Hx    Rectal cancer Neg Hx    Stomach cancer Neg Hx     Social History   Socioeconomic History   Marital status: Single    Spouse name: Not on file   Number of children: 2   Years of education: 14   Highest education level: Associate degree: academic program  Occupational History   Occupation: Consulting civil engineer   Occupation: disabled  Tobacco Use   Smoking status: Never    Passive exposure: Current   Smokeless tobacco: Never  Vaping Use   Vaping status: Never Used  Substance and Sexual Activity   Alcohol use:  Yes    Comment: 1 pint per week   Drug use: Yes    Types: Marijuana    Comment: uses marijuana twice a week   Sexual activity: Yes    Birth control/protection: Surgical  Other Topics Concern   Not on file  Social History Narrative   Not on file   Social Drivers of Health   Financial Resource Strain: Low Risk  (09/03/2023)   Overall Financial Resource Strain (CARDIA)    Difficulty of Paying Living Expenses: Not hard at all  Food Insecurity: No Food Insecurity (09/03/2023)  Hunger Vital Sign    Worried About Running Out of Food in the Last Year: Never true    Ran Out of Food in the Last Year: Never true  Transportation Needs: No Transportation Needs (09/03/2023)   PRAPARE - Administrator, Civil Service (Medical): No    Lack of Transportation (Non-Medical): No  Physical Activity: Unknown (09/03/2023)   Exercise Vital Sign    Days of Exercise per Week: 0 days    Minutes of Exercise per Session: Not on file  Stress: Stress Concern Present (09/03/2023)   [employer] of Occupational Health - Occupational Stress Questionnaire    Feeling of Stress : To some extent  Social Connections: Socially Isolated (09/03/2023)   Social Connection and Isolation Panel [NHANES]    Frequency of Communication with Friends and Family: More than three times a week    Frequency of Social Gatherings with Friends and Family: Once a week    Attends Religious Services: Never    Database administrator or Organizations: No    Attends Engineer, structural: Not on file    Marital Status: Never married  Intimate Partner Violence: Not At Risk (01/15/2023)   Humiliation, Afraid, Rape, and Kick questionnaire    Fear of Current or Ex-Partner: No    Emotionally Abused: No    Physically Abused: No    Sexually Abused: No    Review of Systems: All other review of systems negative except as mentioned in the HPI.  Physical Exam: Vital signs BP (!) 148/95   Pulse 74   Temp (!) 97.5  F (36.4 C) (Tympanic)   Resp 20   Ht 5\' 8"  (1.727 m)   Wt 125.2 kg   SpO2 95%   BMI 41.97 kg/m   General:   Alert,  Well-developed, pleasant and cooperative in NAD Lungs:  Clear throughout to auscultation.   Heart:  Regular rate and rhythm Abdomen:  Soft, nontender and nondistended.   Neuro/Psych:  Alert and cooperative. Normal mood and affect. A and O x 3  Harlin Rain, Janet [name] [facility]

## 2023-12-10 NOTE — Anesthesia Preprocedure Evaluation (Addendum)
Anesthesia Evaluation  Patient identified by MRN, date of birth, ID band Patient awake    Reviewed: Allergy & Precautions, NPO status , Patient's Chart, lab work & pertinent test results  Airway Mallampati: III  TM Distance: >3 FB Neck ROM: Full    Dental  (+) Missing, Poor Dentition   Pulmonary asthma    breath sounds clear to auscultation       Cardiovascular hypertension, +CHF and + DOE   Rhythm:Regular Rate:Normal  Echo:  1. Left ventricular ejection fraction, by estimation, is 30 to 35%. The  left ventricle has moderately decreased function. The left ventricle  demonstrates global hypokinesis. The left ventricular internal cavity size  was moderately dilated. Left  ventricular diastolic parameters are consistent with Grade I diastolic  dysfunction (impaired relaxation).   2. Right ventricular systolic function is normal. The right ventricular  size is normal.   3. The mitral valve is normal in structure. No evidence of mitral valve  regurgitation. No evidence of mitral stenosis.   4. The aortic valve is tricuspid. Aortic valve regurgitation is mild. No  aortic stenosis is present.   5. Aortic dilatation noted. There is mild dilatation of the ascending  aorta, measuring 40 mm.   6. The inferior vena cava is normal in size with greater than 50%  respiratory variability, suggesting right atrial pressure of 3 mmHg.      Neuro/Psych  PSYCHIATRIC DISORDERS  Depression    negative neurological ROS     GI/Hepatic negative GI ROS, Neg liver ROS,,,  Endo/Other  negative endocrine ROS    Renal/GU negative Renal ROS     Musculoskeletal negative musculoskeletal ROS (+)    Abdominal   Peds  Hematology negative hematology ROS (+)   Anesthesia Other Findings   Reproductive/Obstetrics                             Anesthesia Physical Anesthesia Plan  ASA: 4  Anesthesia Plan: MAC   Post-op  Pain Management: Minimal or no pain anticipated   Induction: Intravenous  PONV Risk Score and Plan: 0 and Propofol infusion  Airway Management Planned: Natural Airway and Nasal Cannula  Additional Equipment: None  Intra-op Plan:   Post-operative Plan:   Informed Consent: I have reviewed the patients History and Physical, chart, labs and discussed the procedure including the risks, benefits and alternatives for the proposed anesthesia with the patient or authorized representative who has indicated his/her understanding and acceptance.       Plan Discussed with: CRNA  Anesthesia Plan Comments:        Anesthesia Quick Evaluation

## 2023-12-10 NOTE — Discharge Instructions (Signed)

## 2023-12-10 NOTE — Transfer of Care (Signed)
Immediate Anesthesia Transfer of Care Note  Patient: Janet [name]  Procedure(s) Performed: COLONOSCOPY WITH PROPOFOL POLYPECTOMY HEMOSTASIS CLIP PLACEMENT  Patient Location: PACU  Anesthesia Type:MAC  Level of Consciousness: awake, alert , oriented, and patient cooperative  Airway & Oxygen Therapy: Patient Spontanous Breathing and Patient connected to face mask oxygen  Post-op Assessment: Report given to RN and Post -op Vital signs reviewed and stable  Post vital signs: Reviewed and stable  Last Vitals:  Vitals Value Taken Time  BP 151/65 12/10/23 0815  Temp    Pulse 74 12/10/23 0817  Resp 22 12/10/23 0817  SpO2 100 % 12/10/23 0817  Vitals shown include unfiled device data.  Last Pain:  Vitals:   12/10/23 0651  TempSrc: Tympanic  PainSc: 0-No pain         Complications: No notable events documented.

## 2023-12-10 NOTE — Anesthesia Postprocedure Evaluation (Signed)
Anesthesia Post Note  Patient: FRANKI SKOCZYLAS  Procedure(s) Performed: COLONOSCOPY WITH PROPOFOL POLYPECTOMY HEMOSTASIS CLIP PLACEMENT     Patient location during evaluation: PACU Anesthesia Type: MAC Level of consciousness: awake and alert Pain management: pain level controlled Vital Signs Assessment: post-procedure vital signs reviewed and stable Respiratory status: spontaneous breathing, nonlabored ventilation, respiratory function stable and patient connected to nasal cannula oxygen Cardiovascular status: stable and blood pressure returned to baseline Postop Assessment: no apparent nausea or vomiting Anesthetic complications: no  No notable events documented.  Last Vitals:  Vitals:   12/10/23 0800 12/10/23 0836  BP: (!) 151/65 (!) 139/107  Pulse: 90 71  Resp: 20 19  Temp: (!) 36.2 C   SpO2: 99% 96%    Last Pain:  Vitals:   12/10/23 0820  TempSrc:   PainSc: 0-No pain                 Shelton Silvas

## 2023-12-10 NOTE — Op Note (Signed)
[facility] Patient Name: Janet [name] Procedure Date: 12/10/2023 MRN: [id number] Attending MD: Willaim Rayas. Adela Lank , MD, [id number] Date of Birth: [birth year] CSN: [id number] Age: 58 Admit Type: Outpatient Procedure:                Colonoscopy Indications:              Screening for colorectal malignant neoplasm, This                            is the patient's first colonoscopy Providers:                Willaim Rayas. Adela Lank, MD, Lorenza Evangelist, RN,                            Rozetta Nunnery, Technician Referring MD:              Medicines:                Monitored Anesthesia Care Complications:            No immediate complications. Estimated blood loss:                            Minimal. Estimated Blood Loss:     Estimated blood loss was minimal. Procedure:                Pre-Anesthesia Assessment:                           - Prior to the procedure, a History and Physical                            was performed, and patient medications and                            allergies were reviewed. The patient's tolerance of                            previous anesthesia was also reviewed. The risks                            and benefits of the procedure and the sedation                            options and risks were discussed with the patient.                            All questions were answered, and informed consent                            was obtained. Prior Anticoagulants: The patient has                            taken no anticoagulant or antiplatelet agents. ASA                            Grade  Assessment: IV - A patient with severe                            systemic disease that is a constant threat to life.                            After reviewing the risks and benefits, the patient                            was deemed in satisfactory condition to undergo the                            procedure.                           After obtaining  informed consent, the colonoscope                            was passed under direct vision. Throughout the                            procedure, the patient's blood pressure, pulse, and                            oxygen saturations were monitored continuously. The                            CF-HQ190L ([id number]) Olympus colonoscope was                            introduced through the anus and advanced to the the                            cecum, identified by appendiceal orifice and                            ileocecal valve. The colonoscopy was performed                            without difficulty. The patient tolerated the                            procedure well. The quality of the bowel                            preparation was adequate. The ileocecal valve,                            appendiceal orifice, and rectum were photographed. Scope In: 7:38:41 AM Scope Out: 8:09:06 AM Scope Withdrawal Time: 0 hours 22 minutes 19 seconds  Total Procedure Duration: 0 hours 30 minutes 25 seconds  Findings:      The perianal and digital rectal examinations were normal.      A 5 mm polyp was found in the ascending colon. The  polyp was sessile.       The polyp was removed with a cold snare. Resection and retrieval were       complete.      Two sessile polyps were found in the transverse colon. The polyps were 3       to 5 mm in size. These polyps were removed with a cold snare. Resection       and retrieval were complete.      Three sessile polyps were found in the sigmoid colon. The polyps were 3       to 5 mm in size. These polyps were removed with a cold snare. Resection       and retrieval were complete.      A 15 mm polyp was found in the proximal rectum. The polyp was       semi-pedunculated. The polyp was removed with a hot snare. Resection and       retrieval were complete. One hemostatic clip was successfully placed       across the site to decrease risk for bleeding.      A 3 mm  polyp was found in the rectum. The polyp was sessile. The polyp       was removed with a cold snare. Resection and retrieval were complete.      Multiple small-mouthed diverticula were found in the left colon and       right colon.      Internal hemorrhoids were found during retroflexion.      The colon was tortous. The exam was otherwise without abnormality. Impression:               - One 5 mm polyp in the ascending colon, removed                            with a cold snare. Resected and retrieved.                           - Two 3 to 5 mm polyps in the transverse colon,                            removed with a cold snare. Resected and retrieved.                           - Three 3 to 5 mm polyps in the sigmoid colon,                            removed with a cold snare. Resected and retrieved.                           - One 15 mm polyp in the proximal rectum, removed                            with a hot snare. Resected and retrieved.                           - One 3 mm polyp in the rectum, removed with a cold  snare. Resected and retrieved.                           - Diverticulosis in the left colon and in the right                            colon.                           - Internal hemorrhoids.                           - Tortous colon.                           - The examination was otherwise normal. Moderate Sedation:      No moderate sedation, case performed with MAC Recommendation:           - Patient has a contact number available for                            emergencies. The signs and symptoms of potential                            delayed complications were discussed with the                            patient. Return to normal activities tomorrow.                            Written discharge instructions were provided to the                            patient.                           - Resume previous diet.                           -  Continue present medications.                           - Await pathology results.                           - No ibuprofen, naproxen, or other non-steroidal                            anti-inflammatory drugs for 2 weeks after polyp                            removal. Procedure Code(s):        --- Professional ---                           [phone], Colonoscopy, flexible; with removal of  tumor(s), polyp(s), or other lesion(s) by snare                            technique Diagnosis Code(s):        --- Professional ---                           Z12.11, Encounter for screening for malignant                            neoplasm of colon                           D12.2, Benign neoplasm of ascending colon                           D12.8, Benign neoplasm of rectum                           D12.3, Benign neoplasm of transverse colon (hepatic                            flexure or splenic flexure)                           D12.5, Benign neoplasm of sigmoid colon                           K64.8, Other hemorrhoids                           K57.30, Diverticulosis of large intestine without                            perforation or abscess without bleeding CPT copyright 2022 American Medical Association. All rights reserved. The codes documented in this report are preliminary and upon coder review may  be revised to meet current compliance requirements. Viviann Spare P. Adela Lank, MD 12/10/2023 8:21:22 AM This report has been signed electronically. Number of Addenda: 0

## 2023-12-11 ENCOUNTER — Emergency Department (HOSPITAL_COMMUNITY)
Admission: EM | Admit: 2023-12-11 | Discharge: 2023-12-11 | Disposition: A | Discharge status: Home / Self Care | Payer: 59 | Attending: Emergency Medicine | Admitting: Emergency Medicine

## 2023-12-11 ENCOUNTER — Other Ambulatory Visit: Payer: Self-pay

## 2023-12-11 ENCOUNTER — Emergency Department (HOSPITAL_COMMUNITY): Payer: 59

## 2023-12-11 ENCOUNTER — Encounter: Payer: Self-pay | Admitting: Gastroenterology

## 2023-12-11 DIAGNOSIS — Z7982 Long term (current) use of aspirin: Secondary | ICD-10-CM | POA: Insufficient documentation

## 2023-12-11 DIAGNOSIS — I10 Essential (primary) hypertension: Secondary | ICD-10-CM | POA: Diagnosis not present

## 2023-12-11 DIAGNOSIS — Z79899 Other long term (current) drug therapy: Secondary | ICD-10-CM | POA: Insufficient documentation

## 2023-12-11 DIAGNOSIS — R1031 Right lower quadrant pain: Secondary | ICD-10-CM | POA: Diagnosis not present

## 2023-12-11 LAB — CBC WITH DIFFERENTIAL/PLATELET
Abs Immature Granulocytes: 0.03 10*3/uL (ref 0.00–0.07)
Basophils Absolute: 0 10*3/uL (ref 0.0–0.1)
Basophils Relative: 1 %
Eosinophils Absolute: 0.3 10*3/uL (ref 0.0–0.5)
Eosinophils Relative: 4 %
HCT: 44.1 % (ref 36.0–46.0)
Hemoglobin: 14.1 g/dL (ref 12.0–15.0)
Immature Granulocytes: 0 %
Lymphocytes Relative: 38 %
Lymphs Abs: 2.6 10*3/uL (ref 0.7–4.0)
MCH: 27 pg (ref 26.0–34.0)
MCHC: 32 g/dL (ref 30.0–36.0)
MCV: 84.5 fL (ref 80.0–100.0)
Monocytes Absolute: 0.4 10*3/uL (ref 0.1–1.0)
Monocytes Relative: 6 %
Neutro Abs: 3.5 10*3/uL (ref 1.7–7.7)
Neutrophils Relative %: 51 %
Platelets: 249 10*3/uL (ref 150–400)
RBC: 5.22 MIL/uL — ABNORMAL HIGH (ref 3.87–5.11)
RDW: 15.6 % — ABNORMAL HIGH (ref 11.5–15.5)
WBC: 6.8 10*3/uL (ref 4.0–10.5)
nRBC: 0 % (ref 0.0–0.2)

## 2023-12-11 LAB — URINALYSIS, ROUTINE W REFLEX MICROSCOPIC
Bilirubin Urine: NEGATIVE
Glucose, UA: NEGATIVE mg/dL
Hgb urine dipstick: NEGATIVE
Ketones, ur: NEGATIVE mg/dL
Leukocytes,Ua: NEGATIVE
Nitrite: NEGATIVE
Protein, ur: NEGATIVE mg/dL
Specific Gravity, Urine: 1.018 (ref 1.005–1.030)
pH: 5 (ref 5.0–8.0)

## 2023-12-11 LAB — COMPREHENSIVE METABOLIC PANEL
ALT: 12 U/L (ref 0–44)
AST: 18 U/L (ref 15–41)
Albumin: 3.3 g/dL — ABNORMAL LOW (ref 3.5–5.0)
Alkaline Phosphatase: 51 U/L (ref 38–126)
Anion gap: 7 (ref 5–15)
BUN: 10 mg/dL (ref 6–20)
CO2: 22 mmol/L (ref 22–32)
Calcium: 8.4 mg/dL — ABNORMAL LOW (ref 8.9–10.3)
Chloride: 112 mmol/L — ABNORMAL HIGH (ref 98–111)
Creatinine, Ser: 0.96 mg/dL (ref 0.44–1.00)
GFR, Estimated: >60 mL/min (ref >60)
Glucose, Bld: 94 mg/dL (ref 70–99)
Potassium: 3.6 mmol/L (ref 3.5–5.1)
Sodium: 141 mmol/L (ref 135–145)
Total Bilirubin: 0.6 mg/dL (ref 0.0–1.2)
Total Protein: 6.2 g/dL — ABNORMAL LOW (ref 6.5–8.1)

## 2023-12-11 LAB — SURGICAL PATHOLOGY

## 2023-12-11 MED ORDER — MORPHINE SULFATE (PF) 4 MG/ML IV SOLN
4.0000 mg | Freq: Once | INTRAVENOUS | Status: AC
  Administered 2023-12-11: 4 mg via INTRAVENOUS
  Filled 2023-12-11: qty 1

## 2023-12-11 MED ORDER — SODIUM CHLORIDE 0.9 % IV BOLUS
500.0000 mL | Freq: Once | INTRAVENOUS | Status: AC
  Administered 2023-12-11: 500 mL via INTRAVENOUS

## 2023-12-11 MED ORDER — IOHEXOL 350 MG/ML SOLN
75.0000 mL | Freq: Once | INTRAVENOUS | Status: AC | PRN
  Administered 2023-12-11: 75 mL via INTRAVENOUS

## 2023-12-11 NOTE — ED Triage Notes (Addendum)
Pt. Stated, I have rt. Side pain when I move or walk started 3 days ago. I had a colonoscopy yesterday but I didn't tell them

## 2023-12-11 NOTE — Discharge Instructions (Addendum)
Your labs and CT study are all negative and do not identify the source of your abdominal pain. Please follow up with your doctor this week in the outpatient setting for recheck and any further testing indicated.   Return to the ED with any severe pain, high fever, large volume blood per rectum or new concern.

## 2023-12-11 NOTE — ED Provider Notes (Signed)
[place] EMERGENCY DEPARTMENT AT [facility] Provider Note   CSN: [id number] Arrival date & time: 12/11/23  0751     History  Chief Complaint  Patient presents with   Flank Pain    Janet [name] is a 58 y.o. female.  Patient to ED for evaluation of right lower quadrant abdominal pain that started 3 days ago. No fever, nausea, vomiting. No urinary symptoms. The pain is constant/progressive. She underwent screening colonoscopy yesterday but reports the pain was present prior to study and was unchanged since the study. She sees a small amount of blood with stools but only since the scope yesterday. No previous abdominal surgeries.   The history is provided by the patient. No language interpreter was used.  Flank Pain       Home Medications Prior to Admission medications   Medication Sig Start Date End Date Taking? Authorizing Provider  albuterol (PROVENTIL) (2.5 MG/3ML) 0.083% nebulizer solution Take 3 mLs (2.5 mg total) by nebulization every 4 (four) hours as needed for wheezing or shortness of breath. 06/04/23  Yes Gloris Manchester, MD  albuterol (VENTOLIN HFA) 108 (90 Base) MCG/ACT inhaler Inhale 2 puffs into the lungs every 4 (four) hours as needed for wheezing or shortness of breath. 01/16/23  Yes Cathren Laine, MD  ARIPiprazole (ABILIFY) 10 MG tablet Take 10 mg by mouth daily. 01/04/23  Yes [provider]  aspirin 81 MG chewable tablet Chew 81 mg by mouth daily.   Yes [provider]  Budeson-Glycopyrrol-Formoterol (BREZTRI AEROSPHERE) 160-9-4.8 MCG/ACT AERO Inhale 2 puffs into the lungs in the morning and at bedtime. 06/08/21  Yes Hunsucker, Lesia Sago, MD  carvedilol (COREG) 12.5 MG tablet Take 1 tablet (12.5 mg total) by mouth 2 (two) times daily with a meal. 11/15/23  Yes Sabharwal, Aditya, DO  dextromethorphan-guaiFENesin (MUCINEX DM) 30-600 MG 12hr tablet Take 1 tablet by mouth 2 (two) times daily as needed for cough.   Yes [provider]  ENTRESTO 49-51 MG TAKE ONE TABLET BY MOUTH TWICE DAILY @ 9AM-5PM 01/24/23  Yes Sabharwal, Aditya, DO  fluticasone (FLONASE) 50 MCG/ACT nasal spray Place 2 sprays into both nostrils daily. Patient taking differently: Place 2 sprays into both nostrils daily as needed for allergies or rhinitis. 01/18/23  Yes Zonia Kief, Amy J, NP  furosemide (LASIX) 20 MG tablet TAKE ONE TABLET (20 MG) BY MOUTH DAILY AT 9AM Patient taking differently: Take 20 mg by mouth daily as needed for fluid or edema. 11/15/23  Yes Sabharwal, Aditya, DO  hydrOXYzine (VISTARIL) 50 MG capsule Take 100 mg by mouth at bedtime. 01/02/23  Yes [provider]  montelukast (SINGULAIR) 10 MG tablet TAKE 1 TABLET(10 MG) BY MOUTH AT BEDTIME 03/30/23  Yes Zonia Kief, Amy J, NP  spironolactone (ALDACTONE) 25 MG tablet TAKE 1 TABLET(25 MG) BY MOUTH DAILY 04/12/23  Yes Sabharwal, Aditya, DO  traZODone (DESYREL) 150 MG tablet Take 150 mg by mouth at bedtime.   Yes [provider]  azithromycin (ZITHROMAX) 250 MG tablet Take 2 tabs PO day 1, then take 1 tab PO once daily Patient not taking: Reported on 12/11/2023 11/20/23   Mayers, Cari S, PA-C  benzonatate (TESSALON) 100 MG capsule Take 1-2 caps PO TID PRN Patient not taking: Reported on 12/11/2023 11/20/23   Mayers, Cari S, PA-C  Na Sulfate-K Sulfate-Mg Sulfate concentrate 17.5-3.13-1.6 GM/177ML SOLN SMARTSIG:1 Kit(s) By Mouth Once Patient not taking: Reported on 12/11/2023 11/19/23   [provider]      Allergies  Clarithromycin, Penicillins, and Penicillin g    Review of Systems   Review of Systems  Genitourinary:  Positive for flank pain.    Physical Exam Updated Vital Signs BP (!) 123/95 (BP Location: Left Arm)   Pulse 60   Temp 97.8 F (36.6 C) (Oral)   Resp 18   SpO2 98%  Physical Exam Vitals and nursing note reviewed.  Constitutional:      Appearance: She is well-developed.  HENT:     Head: Normocephalic.  Cardiovascular:     Rate and Rhythm:  Normal rate.  Pulmonary:     Effort: Pulmonary effort is normal.  Abdominal:     General: Bowel sounds are normal.     Palpations: Abdomen is soft.     Tenderness: There is abdominal tenderness (RLQ). There is no right CVA tenderness, guarding or rebound.  Musculoskeletal:        General: Normal range of motion.     Cervical back: Normal range of motion and neck supple.  Skin:    General: Skin is warm and dry.  Neurological:     General: No focal deficit present.     Mental Status: She is alert and oriented to person, place, and time.     ED Results / Procedures / Treatments   Labs (all labs ordered are listed, but only abnormal results are displayed) Labs Reviewed  URINALYSIS, ROUTINE W REFLEX MICROSCOPIC - Abnormal; Notable for the following components:      Result Value   APPearance HAZY (*)    All other components within normal limits  CBC WITH DIFFERENTIAL/PLATELET - Abnormal; Notable for the following components:   RBC 5.22 (*)    RDW 15.6 (*)    All other components within normal limits  COMPREHENSIVE METABOLIC PANEL - Abnormal; Notable for the following components:   Chloride 112 (*)    Calcium 8.4 (*)    Total Protein 6.2 (*)    Albumin 3.3 (*)    All other components within normal limits   Results for orders placed or performed during the hospital encounter of 12/11/23  Urinalysis, Routine w reflex microscopic -Urine, Clean Catch   Collection Time: 12/11/23  8:11 AM  Result Value Ref Range   Color, Urine YELLOW YELLOW   APPearance HAZY (A) CLEAR   Specific Gravity, Urine 1.018 1.005 - 1.030   pH 5.0 5.0 - 8.0   Glucose, UA NEGATIVE NEGATIVE mg/dL   Hgb urine dipstick NEGATIVE NEGATIVE   Bilirubin Urine NEGATIVE NEGATIVE   Ketones, ur NEGATIVE NEGATIVE mg/dL   Protein, ur NEGATIVE NEGATIVE mg/dL   Nitrite NEGATIVE NEGATIVE   Leukocytes,Ua NEGATIVE NEGATIVE  CBC with Differential/Platelet   Collection Time: 12/11/23  9:45 AM  Result Value Ref Range   WBC  6.8 4.0 - 10.5 K/uL   RBC 5.22 (H) 3.87 - 5.11 MIL/uL   Hemoglobin 14.1 12.0 - 15.0 g/dL   HCT 16.1 09.6 - 04.5 %   MCV 84.5 80.0 - 100.0 fL   MCH 27.0 26.0 - 34.0 pg   MCHC 32.0 30.0 - 36.0 g/dL   RDW 40.9 (H) 81.1 - 91.4 %   Platelets 249 150 - 400 K/uL   nRBC 0.0 0.0 - 0.2 %   Neutrophils Relative % 51 %   Neutro Abs 3.5 1.7 - 7.7 K/uL   Lymphocytes Relative 38 %   Lymphs Abs 2.6 0.7 - 4.0 K/uL   Monocytes Relative 6 %   Monocytes Absolute 0.4 0.1 - 1.0 K/uL  Eosinophils Relative 4 %   Eosinophils Absolute 0.3 0.0 - 0.5 K/uL   Basophils Relative 1 %   Basophils Absolute 0.0 0.0 - 0.1 K/uL   Immature Granulocytes 0 %   Abs Immature Granulocytes 0.03 0.00 - 0.07 K/uL  Comprehensive metabolic panel   Collection Time: 12/11/23  9:45 AM  Result Value Ref Range   Sodium 141 135 - 145 mmol/L   Potassium 3.6 3.5 - 5.1 mmol/L   Chloride 112 (H) 98 - 111 mmol/L   CO2 22 22 - 32 mmol/L   Glucose, Bld 94 70 - 99 mg/dL   BUN 10 6 - 20 mg/dL   Creatinine, Ser 2.99 0.44 - 1.00 mg/dL   Calcium 8.4 (L) 8.9 - 10.3 mg/dL   Total Protein 6.2 (L) 6.5 - 8.1 g/dL   Albumin 3.3 (L) 3.5 - 5.0 g/dL   AST 18 15 - 41 U/L   ALT 12 0 - 44 U/L   Alkaline Phosphatase 51 38 - 126 U/L   Total Bilirubin 0.6 0.0 - 1.2 mg/dL   GFR, Estimated >37 >16 mL/min   Anion gap 7 5 - 15    EKG None  Radiology CT ABDOMEN PELVIS W CONTRAST Result Date: 12/11/2023 CLINICAL DATA:  Right lower quadrant pain. EXAM: CT ABDOMEN AND PELVIS WITH CONTRAST TECHNIQUE: Multidetector CT imaging of the abdomen and pelvis was performed using the standard protocol following bolus administration of intravenous contrast. RADIATION DOSE REDUCTION: This exam was performed according to the departmental dose-optimization program which includes automated exposure control, adjustment of the mA and/or kV according to patient size and/or use of iterative reconstruction technique. CONTRAST:  75mL OMNIPAQUE IOHEXOL 350 MG/ML SOLN  COMPARISON:  None Available. FINDINGS: Lower Chest: Lung bases are clear. Lobulated soft tissue mass is seen in the inferior left breast measuring 2.6 cm. Hepatobiliary: No suspicious hepatic masses identified. Gallbladder is unremarkable. No evidence of biliary ductal dilatation. Pancreas:  No mass or inflammatory changes. Spleen: Within normal limits in size and appearance. Adrenals/Urinary Tract: No suspicious masses identified. No evidence of ureteral calculi or hydronephrosis. Stomach/Bowel: No evidence of obstruction, inflammatory process or abnormal fluid collections. Normal appendix visualized. Vascular/Lymphatic: No pathologically enlarged lymph nodes. No acute vascular findings. Reproductive: Prior hysterectomy noted. Adnexal regions are unremarkable in appearance. Other:  None. Musculoskeletal:  No suspicious bone lesions identified. IMPRESSION: No No evidence of appendicitis or other acute findings. 2.6 cm lobulated soft tissue mass in inferior left breast. Recommend correlation with recent breast biopsy results. Electronically Signed   By: Danae Orleans M.D.   On: 12/11/2023 13:05    Procedures Procedures    Medications Ordered in ED Medications  sodium chloride 0.9 % bolus 500 mL (0 mLs Intravenous Stopped 12/11/23 1220)  morphine (PF) 4 MG/ML injection 4 mg (4 mg Intravenous Given 12/11/23 0943)  iohexol (OMNIPAQUE) 350 MG/ML injection 75 mL (75 mLs Intravenous Contrast Given 12/11/23 1243)    ED Course/ Medical Decision Making/ A&P                                 Medical Decision Making This patient presents to the ED for concern of RLQ abdominal pain, this involves an extensive number of treatment options, and is a complaint that carries with it a high risk of complications and morbidity.  The differential diagnosis includes appendicitis, kidney stones, UTI/pyelonephritis, colitis, obstruction   Co morbidities that complicate the patient evaluation  Obesity,  HTN   Additional  history obtained:  Additional history and/or information obtained from chart review, notable for Colonscopy review - multiple polyps removed, o/w unremarkable study   Lab Tests:  I Ordered, and personally interpreted labs.  The pertinent results include:  No leukocytosis, normal hgb; essentially normal electrolytes, normal renal function; UA without evidence of infection    Imaging Studies ordered:  I ordered imaging studies including CT abd/pel I independently visualized and interpreted imaging which showed unremarkable CT of the abdomen/pelvis, specifically, normal appendix per radiologist interpretation   Medicines ordered and prescription drug management:  I ordered medication including Morphine  for pain Reevaluation of the patient after these medicines showed that the patient improved I have reviewed the patients home medicines and have made adjustments as needed   Test Considered:  CT r/o appy   Critical Interventions:  Patient stable   Problem List / ED Course:  RLQ abdominal pain x 3 days without fever, nausea, vomiting Consider appy, kidney stone Normal lab studies, VSS, afebrile No appendicitis, no ureteral calculi Doubt pain related to colonoscopy as pain started prior to study. No perforation on CT performed today.   Reevaluation:  After the interventions noted above, I reevaluated the patient and found that they have :improved   Disposition:  After consideration of the diagnostic results and the patients response to treatment, I feel that the patient would benefit from Stable for discharge home. .   Amount and/or Complexity of Data Reviewed Labs: ordered. Radiology: ordered.  Risk Prescription drug management.           Final Clinical Impression(s) / ED Diagnoses Final diagnoses:  Right lower quadrant abdominal pain    Rx / DC Orders ED Discharge Orders     None         Elpidio Anis, PA-C 12/11/23 1317    Elayne Snare K, DO 12/11/23 1521

## 2023-12-12 ENCOUNTER — Encounter (HOSPITAL_COMMUNITY): Payer: Self-pay | Admitting: Gastroenterology

## 2023-12-12 ENCOUNTER — Telehealth: Payer: Self-pay

## 2023-12-12 NOTE — Telephone Encounter (Signed)
Noted  

## 2023-12-12 NOTE — Transitions of Care (Post Inpatient/ED Visit) (Signed)
12/12/2023  Name: Janet [name] MRN: [id number] DOB: 1966/06/16  Today's TOC FU Call Status: Today's TOC FU Call Status:: Successful TOC FU Call Completed TOC FU Call Complete Date: 12/12/23 Patient's Name and Date of Birth confirmed.  Transition Care Management Follow-up Telephone Call Date of Discharge: 12/11/23 Discharge Facility: [facility]) Type of Discharge: Emergency Department Reason for ED Visit: Other: (abd pain) How have you been since you were released from the hospital?: Same Any questions or concerns?: No  Items Reviewed: Did you receive and understand the discharge instructions provided?: Yes Medications obtained,verified, and reconciled?: Yes (Medications Reviewed) Any new allergies since your discharge?: No Dietary orders reviewed?: Yes Do you have support at home?: Yes People in Home: child(ren), adult  Medications Reviewed Today: Medications Reviewed Today     Reviewed by Karena Addison, LPN (Licensed Practical Nurse) on 12/12/23 at 1101  Med List Status: <None>   Medication Order Taking? Sig Documenting Provider Last Dose Status Informant  albuterol (PROVENTIL) (2.5 MG/3ML) 0.083% nebulizer solution [id number] No Take 3 mLs (2.5 mg total) by nebulization every 4 (four) hours as needed for wheezing or shortness of breath. Gloris Manchester, MD Past Month Active Self, Pharmacy Records  albuterol (VENTOLIN HFA) 108 (90 Base) MCG/ACT inhaler [id number] No Inhale 2 puffs into the lungs every 4 (four) hours as needed for wheezing or shortness of breath. Cathren Laine, MD 12/11/2023 Active Self, Pharmacy Records  ARIPiprazole (ABILIFY) 10 MG tablet [id number] No Take 10 mg by mouth daily. [provider] Past Month Active Self, Pharmacy Records           Med Note [name]   Tue Dec 11, 2023  1:11 PM) Patient states she is not compliant.   aspirin 81 MG chewable tablet [id number] No Chew 81 mg by mouth daily. [provider] Past Month  Active Self, Pharmacy Records           Med Note [name]   Tue Dec 11, 2023  1:12 PM) Was taking daily, but chose to stop for her colonoscopy.   azithromycin (ZITHROMAX) 250 MG tablet [id number] No Take 2 tabs PO day 1, then take 1 tab PO once daily  Patient not taking: Reported on 12/11/2023   Janet Jaffe, PA-C Not Taking Active Self, Pharmacy Records  benzonatate (TESSALON) 100 MG capsule [id number] No Take 1-2 caps PO TID PRN  Patient not taking: Reported on 12/11/2023   Janet Jaffe, PA-C Not Taking Active Self, Pharmacy Records  Budeson-Glycopyrrol-Formoterol (BREZTRI AEROSPHERE) 160-9-4.8 MCG/ACT AERO [id number] No Inhale 2 puffs into the lungs in the morning and at bedtime. Hunsucker, Lesia Sago, MD 12/11/2023 Active Self, Pharmacy Records  carvedilol (COREG) 12.5 MG tablet [id number] No Take 1 tablet (12.5 mg total) by mouth 2 (two) times daily with a meal. Sabharwal, Aditya, DO 12/10/2023 Active Self, Pharmacy Records  dextromethorphan-guaiFENesin [facility] DM) 30-600 MG 12hr tablet [id number] No Take 1 tablet by mouth 2 (two) times daily as needed for cough. [provider] Past Week Active Self, Pharmacy Records  [address] [place] [id number] No TAKE ONE TABLET BY MOUTH TWICE DAILY @ 9AM-5PM Sabharwal, Aditya, DO 12/10/2023 Active Self, Pharmacy Records  fluticasone (FLONASE) 50 MCG/ACT nasal spray [id number] No Place 2 sprays into both nostrils daily.  Patient taking differently: Place 2 sprays into both nostrils daily as needed for allergies or rhinitis.   Janet Fendt, NP Past Month Active Self, Pharmacy Records  furosemide (LASIX) 20 MG tablet [id number] No TAKE  ONE TABLET (20 MG) BY MOUTH DAILY AT 9AM  Patient taking differently: Take 20 mg by mouth daily as needed for fluid or edema.   Janet Nettles, DO Past Week Active Self, Pharmacy Records  hydrOXYzine (VISTARIL) 50 MG capsule [id number] No Take 100 mg by mouth at bedtime. [provider] Past Week  Active Self, Pharmacy Records  montelukast (SINGULAIR) 10 MG tablet [id number] No TAKE 1 TABLET(10 MG) BY MOUTH AT BEDTIME Janet Fendt, NP Past Week Active Self, Pharmacy Records  Na Sulfate-K Sulfate-Mg Sulfate concentrate 17.5-3.13-1.6 GM/177ML SOLN [id number] No SMARTSIG:1 Kit(s) By Mouth Once  Patient not taking: Reported on 12/11/2023   [provider] Not Taking Active Self, Pharmacy Records  spironolactone (ALDACTONE) 25 MG tablet [id number] No TAKE 1 TABLET(25 MG) BY MOUTH DAILY Sabharwal, Aditya, DO 12/10/2023 Active Self, Pharmacy Records  traZODone (DESYREL) 150 MG tablet [id number] No Take 150 mg by mouth at bedtime. [provider] Past Week Active Self, Pharmacy Records            Home Care and Equipment/Supplies: Were Home Health Services Ordered?: NA Any new equipment or medical supplies ordered?: NA  Functional Questionnaire: Do you need assistance with bathing/showering or dressing?: No Do you need assistance with meal preparation?: No Do you need assistance with eating?: No Do you have difficulty maintaining continence: No Do you need assistance with getting out of bed/getting out of a chair/moving?: No Do you have difficulty managing or taking your medications?: No  Follow up appointments reviewed: PCP Follow-up appointment confirmed?: Yes Date of PCP follow-up appointment?: 12/17/23 Follow-up Provider: [facility] Follow-up appointment confirmed?: NA Do you need transportation to your follow-up appointment?: No Do you understand care options if your condition(s) worsen?: Yes-patient verbalized understanding    SIGNATURE Karena Addison, LPN [facility] Nurse Health Advisor Direct Dial [phone]

## 2023-12-14 ENCOUNTER — Other Ambulatory Visit: Payer: Self-pay | Admitting: Surgery

## 2023-12-14 DIAGNOSIS — N6320 Unspecified lump in the left breast, unspecified quadrant: Secondary | ICD-10-CM

## 2023-12-17 ENCOUNTER — Ambulatory Visit (INDEPENDENT_AMBULATORY_CARE_PROVIDER_SITE_OTHER): Payer: 59 | Admitting: Family

## 2023-12-17 VITALS — BP 119/81 | HR 69 | Temp 99.1°F | Ht 68.0 in | Wt 264.8 lb

## 2023-12-17 DIAGNOSIS — R0609 Other forms of dyspnea: Secondary | ICD-10-CM | POA: Diagnosis not present

## 2023-12-17 DIAGNOSIS — Z09 Encounter for follow-up examination after completed treatment for conditions other than malignant neoplasm: Secondary | ICD-10-CM | POA: Diagnosis not present

## 2023-12-17 DIAGNOSIS — R1031 Right lower quadrant pain: Secondary | ICD-10-CM | POA: Diagnosis not present

## 2023-12-17 DIAGNOSIS — J4540 Moderate persistent asthma, uncomplicated: Secondary | ICD-10-CM

## 2023-12-17 DIAGNOSIS — N1831 Chronic kidney disease, stage 3a: Secondary | ICD-10-CM | POA: Diagnosis not present

## 2023-12-17 MED ORDER — BREZTRI AEROSPHERE 160-9-4.8 MCG/ACT IN AERO: 2.0000 | INHALATION_SPRAY | Freq: Two times a day (BID) | RESPIRATORY_TRACT | 5 refills | Status: AC

## 2023-12-17 NOTE — Progress Notes (Signed)
Patient states her right side still hurts.    States she is still wheezing a lot.

## 2023-12-17 NOTE — Progress Notes (Signed)
TRANSITION OF CARE VISIT    Date of Admission: 12/11/2023  Date of Discharge: 12/11/2023  Transitions of Care Call: 12/12/2023  Discharged from: [facility] Emergency Department at [facility]   Discharge Diagnosis:  Right lower quadrant abdominal pain  Summary of Admission per DO note:  ED Course/ Medical Decision Making/ A&P                               Medical Decision Making This patient presents to the ED for concern of RLQ abdominal pain, this involves an extensive number of treatment options, and is a complaint that carries with it a high risk of complications and morbidity.  The differential diagnosis includes appendicitis, kidney stones, UTI/pyelonephritis, colitis, obstruction     Co morbidities that complicate the patient evaluation   Obesity, HTN     Additional history obtained:   Additional history and/or information obtained from chart review, notable for Colonscopy review - multiple polyps removed, o/w unremarkable study     Lab Tests:   I Ordered, and personally interpreted labs.  The pertinent results include:  No leukocytosis, normal hgb; essentially normal electrolytes, normal renal function; UA without evidence of infection      Imaging Studies ordered:   I ordered imaging studies including CT abd/pel I independently visualized and interpreted imaging which showed unremarkable CT of the abdomen/pelvis, specifically, normal appendix per radiologist interpretation     Medicines ordered and prescription drug management:   I ordered medication including Morphine  for pain Reevaluation of the patient after these medicines showed that the patient improved I have reviewed the patients home medicines and have made adjustments as needed     Test Considered:   CT r/o appy     Critical Interventions:   Patient stable    Problem List / ED Course:   RLQ abdominal pain x 3 days without  fever, nausea, vomiting Consider appy, kidney stone Normal lab studies, VSS, afebrile No appendicitis, no ureteral calculi Doubt pain related to colonoscopy as pain started prior to study. No perforation on CT performed today.     Reevaluation:   After the interventions noted above, I reevaluated the patient and found that they have :improved     Disposition:   After consideration of the diagnostic results and the patients response to treatment, I feel that the patient would benefit from Stable for discharge home.   Today's visit 12/17/2023: - States still having abdominal discomfort since Emergency Department discharge. Denies red flag symptoms.  - States she never established with Nephrology. - Established with Pulmonology. Needs refills of Breztri. Denies red flag symptoms.   Patient/Caregiver self-reported problems/concerns: see above   MEDICATIONS  Medication Reconciliation conducted with patient/caregiver? (Yes/ No): Yes   New medications prescribed/discontinued upon discharge? (Yes/No): No  Barriers identified related to medications: No  LABS  Lab Reviewed (Yes/No/NA): Yes  PHYSICAL EXAM:  Today's Vitals   12/17/23 0932  BP: 119/81  Pulse: 69  Temp: 99.1 F (37.3 C)  TempSrc: Oral  SpO2: 93%  Weight: 264 lb 12.8 oz (120.1 kg)  Height: 5\' 8"  (1.727 m)   Body mass index is 40.26 kg/m.    Physical Exam HENT:     Head: Normocephalic and atraumatic.     Nose: Nose normal.     Mouth/Throat:     Mouth: Mucous membranes are moist.     Pharynx: Oropharynx is clear.  Eyes:  Extraocular Movements: Extraocular movements intact.     Conjunctiva/sclera: Conjunctivae normal.     Pupils: Pupils are equal, round, and reactive to light.  Cardiovascular:     Rate and Rhythm: Normal rate and regular rhythm.     Pulses: Normal pulses.     Heart sounds: Normal heart sounds.  Pulmonary:     Effort: Pulmonary effort is normal.     Breath sounds: Normal breath  sounds.  Musculoskeletal:        General: Normal range of motion.     Cervical back: Normal range of motion and neck supple.  Neurological:     General: No focal deficit present.     Mental Status: She is alert and oriented to person, place, and time.  Psychiatric:        Mood and Affect: Mood normal.        Behavior: Behavior normal.    ASSESSMENT AND PLAN: 1. Hospital discharge follow-up (Primary) - Reviewed hospital course, current medications, ensured proper follow-up in place, and addressed concerns.   2. Right lower quadrant abdominal pain - Referral to Gastroenterology for evaluation/management. - Ambulatory referral to Gastroenterology  3. Stage 3a chronic kidney disease (HCC) - Referral to Nephrology for evaluation/management. - Ambulatory referral to Nephrology  4. Moderate persistent asthma, unspecified whether complicated 5. DOE (dyspnea on exertion) - Continue Budeson-Glycopyrrol-Formoterol as prescribed. Counseled on medication adherence/adverse effects.  - Keep all scheduled appointments with established Pulmonology.  - Budeson-Glycopyrrol-Formoterol (BREZTRI AEROSPHERE) 160-9-4.8 MCG/ACT AERO; Inhale 2 puffs into the lungs in the morning and at bedtime.  Dispense: 10.7 g; Refill: 5   PATIENT EDUCATION PROVIDED: See AVS   FOLLOW-UP (Include any further testing or referrals):  - Referral to Gastroenterology.  - Keep all scheduled appointments with Pulmonology.  - Follow-up with primary provider as scheduled.   Patient was given clear instructions to go to Emergency Department or return to medical center if symptoms don't improve, worsen, or new problems develop.The patient verbalized understanding.

## 2024-01-03 ENCOUNTER — Ambulatory Visit: Payer: 59

## 2024-01-03 DIAGNOSIS — Z0000 Encounter for general adult medical examination without abnormal findings: Secondary | ICD-10-CM

## 2024-01-03 NOTE — Progress Notes (Signed)
Subjective:   Janet [name] is a 58 y.o. female who presents for Medicare Annual (Subsequent) preventive examination.  Visit Complete: Virtual I connected with  Janet [name] on 01/03/24 by a video and audio enabled telemedicine application and verified that I am speaking with the correct person using two identifiers.  Patient Location: Home  Provider Location: Home Office  I discussed the limitations of evaluation and management by telemedicine. The patient expressed understanding and agreed to proceed.  Vital Signs: Because this visit was a virtual/telehealth visit, some criteria may be missing or patient reported. Any vitals not documented were not able to be obtained and vitals that have been documented are patient reported.  Patient Medicare AWV questionnaire was completed by the patient on 01/03/2024; I have confirmed that all information answered by patient is correct and no changes since this date.  Cardiac Risk Factors include: hypertension     Objective:    Today's Vitals   There is no height or weight on file to calculate BMI.     01/03/2024   11:39 AM 12/11/2023    8:07 AM 12/10/2023    6:58 AM 06/04/2023   11:31 AM 01/15/2023   11:20 AM 03/29/2021   10:26 PM  Advanced Directives  Does Patient Have a Medical Advance Directive? No No No No No No  Would patient like information on creating a medical advance directive? No - Patient declined  No - Patient declined No - Patient declined Yes (Inpatient - patient defers creating a medical advance directive at this time - Information given) No - Patient declined    Current Medications (verified) Outpatient Encounter Medications as of 01/03/2024  Medication Sig   albuterol (PROVENTIL) (2.5 MG/3ML) 0.083% nebulizer solution Take 3 mLs (2.5 mg total) by nebulization every 4 (four) hours as needed for wheezing or shortness of breath.   albuterol (VENTOLIN HFA) 108 (90 Base) MCG/ACT inhaler Inhale 2 puffs into the  lungs every 4 (four) hours as needed for wheezing or shortness of breath.   ARIPiprazole (ABILIFY) 10 MG tablet Take 10 mg by mouth daily.   aspirin 81 MG chewable tablet Chew 81 mg by mouth daily.   Budeson-Glycopyrrol-Formoterol (BREZTRI AEROSPHERE) 160-9-4.8 MCG/ACT AERO Inhale 2 puffs into the lungs in the morning and at bedtime.   carvedilol (COREG) 12.5 MG tablet Take 1 tablet (12.5 mg total) by mouth 2 (two) times daily with a meal.   ENTRESTO 49-51 MG TAKE ONE TABLET BY MOUTH TWICE DAILY @ 9AM-5PM   fluticasone (FLONASE) 50 MCG/ACT nasal spray Place 2 sprays into both nostrils daily. (Patient taking differently: Place 2 sprays into both nostrils daily as needed for allergies or rhinitis.)   furosemide (LASIX) 20 MG tablet TAKE ONE TABLET (20 MG) BY MOUTH DAILY AT 9AM (Patient taking differently: Take 20 mg by mouth daily as needed for fluid or edema.)   hydrOXYzine (VISTARIL) 50 MG capsule Take 100 mg by mouth at bedtime.   montelukast (SINGULAIR) 10 MG tablet TAKE 1 TABLET(10 MG) BY MOUTH AT BEDTIME   spironolactone (ALDACTONE) 25 MG tablet TAKE 1 TABLET(25 MG) BY MOUTH DAILY   traZODone (DESYREL) 150 MG tablet Take 150 mg by mouth at bedtime.   azithromycin (ZITHROMAX) 250 MG tablet Take 2 tabs PO day 1, then take 1 tab PO once daily (Patient not taking: Reported on 12/11/2023)   benzonatate (TESSALON) 100 MG capsule Take 1-2 caps PO TID PRN (Patient not taking: Reported on 12/11/2023)   dextromethorphan-guaiFENesin (MUCINEX DM) 30-600  MG 12hr tablet Take 1 tablet by mouth 2 (two) times daily as needed for cough.   Na Sulfate-K Sulfate-Mg Sulfate concentrate 17.5-3.13-1.6 GM/177ML SOLN SMARTSIG:1 Kit(s) By Mouth Once (Patient not taking: Reported on 12/11/2023)   No facility-administered encounter medications on file as of 01/03/2024.    Allergies (verified) Clarithromycin, Penicillins, and Penicillin g   History: Past Medical History:  Diagnosis Date   Allergy    Anxiety     Arthritis    Asthma    CHF (congestive heart failure) (HCC)    Congestive heart failure (CHF) (HCC)    Depression    Elevated brain natriuretic peptide (BNP) level    Hypertension    Lymphedema    SOB (shortness of breath)    Substance abuse (HCC)    Past Surgical History:  Procedure Laterality Date   ABDOMINAL HYSTERECTOMY     BREAST BIOPSY Right    BREAST BIOPSY Left 10/16/2023   times 3   BREAST BIOPSY Left 10/16/2023   [place] LT BREAST BX W LOC DEV EA ADD LESION IMG BX SPEC US GUIDE 10/16/2023 [facility]   BREAST BIOPSY Left 10/16/2023   [place] LT BREAST BX W LOC DEV 1ST LESION IMG BX SPEC US GUIDE 10/16/2023 [facility]   BREAST BIOPSY Left 10/16/2023   [place] LT BREAST BX W LOC DEV EA ADD LESION IMG BX SPEC US GUIDE 10/16/2023 [facility]   CESAREAN SECTION     COLONOSCOPY WITH PROPOFOL N/A 12/10/2023   Procedure: COLONOSCOPY WITH PROPOFOL;  Surgeon: Benancio Deeds, MD;  Location: [facility];  Service: Gastroenterology;  Laterality: N/A;   HEMOSTASIS CLIP PLACEMENT  12/10/2023   Procedure: HEMOSTASIS CLIP PLACEMENT;  Surgeon: Benancio Deeds, MD;  Location: [facility];  Service: Gastroenterology;;   HERNIA REPAIR     umbilical as a child   POLYPECTOMY  12/10/2023   Procedure: POLYPECTOMY;  Surgeon: Benancio Deeds, MD;  Location: [facility];  Service: Gastroenterology;;   RIGHT/LEFT HEART CATH AND CORONARY ANGIOGRAPHY N/A 04/01/2021   Procedure: RIGHT/LEFT HEART CATH AND CORONARY ANGIOGRAPHY;  Surgeon: Corky Crafts, MD;  Location: [facility] INVASIVE CV LAB;  Service: Cardiovascular;  Laterality: N/A;   TUBAL LIGATION     Family History  Problem Relation Age of Onset   Colon polyps Mother    Hypertension Mother    Stroke Mother    Deep vein thrombosis Mother    Congestive Heart Failure Mother    Arthritis Mother    Asthma Mother    Depression Mother    Other Father        history unknown   Obesity Sister    Diabetes Sister     Obesity Brother    Congestive Heart Failure Maternal Grandmother    Colon cancer Neg Hx    Esophageal cancer Neg Hx    Rectal cancer Neg Hx    Stomach cancer Neg Hx    Social History   Socioeconomic History   Marital status: Single    Spouse name: Not on file   Number of children: 2   Years of education: 14   Highest education level: Associate degree: academic program  Occupational History   Occupation: Consulting civil engineer   Occupation: disabled  Tobacco Use   Smoking status: Never    Passive exposure: Current   Smokeless tobacco: Never  Vaping Use   Vaping status: Never Used  Substance and Sexual Activity   Alcohol use: Not Currently    Alcohol/week: 3.0 standard drinks of  alcohol    Types: 3 Shots of liquor per week    Comment: 1 pint per week   Drug use: Yes    Frequency: 1.0 times per week    Types: Marijuana    Comment: uses marijuana twice a week   Sexual activity: Not Currently    Birth control/protection: Surgical  Other Topics Concern   Not on file  Social History Narrative   Not on file   Social Drivers of Health   Financial Resource Strain: Low Risk  (01/03/2024)   Overall Financial Resource Strain (CARDIA)    Difficulty of Paying Living Expenses: Not very hard  Food Insecurity: Food Insecurity Present (01/03/2024)   Hunger Vital Sign    Worried About Running Out of Food in the Last Year: Never true    Ran Out of Food in the Last Year: Sometimes true  Transportation Needs: No Transportation Needs (01/03/2024)   PRAPARE - Administrator, Civil Service (Medical): No    Lack of Transportation (Non-Medical): No  Physical Activity: Inactive (01/03/2024)   Exercise Vital Sign    Days of Exercise per Week: 0 days    Minutes of Exercise per Session: 0 min  Stress: Stress Concern Present (01/03/2024)   [employer] of Occupational Health - Occupational Stress Questionnaire    Feeling of Stress : Very much  Social Connections: Socially Isolated (01/03/2024)    Social Connection and Isolation Panel [NHANES]    Frequency of Communication with Friends and Family: More than three times a week    Frequency of Social Gatherings with Friends and Family: Once a week    Attends Religious Services: Never    Database administrator or Organizations: No    Attends Engineer, structural: Not on file    Marital Status: Never married    Tobacco Counseling Counseling given: Not Answered   Clinical Intake:  Pre-visit preparation completed: Yes  Pain : No/denies pain     Nutritional Risks: None Diabetes: No  How often do you need to have someone help you when you read instructions, pamphlets, or other written materials from your doctor or pharmacy?: 1 - Never  Interpreter Needed?: No  Information entered by :: NAllen LPN   Activities of Daily Living    01/03/2024    5:49 AM 01/15/2023   11:14 AM  In your present state of health, do you have any difficulty performing the following activities:  Hearing? 0 0  Vision? 0 1  Difficulty concentrating or making decisions? 0 0  Walking or climbing stairs? 1 1  Comment has lymphedema   Dressing or bathing? 1 0  Comment declines help at this time   Doing errands, shopping? 0 1  Preparing Food and eating ? N N  Using the Toilet? N N  In the past six months, have you accidently leaked urine? N N  Do you have problems with loss of bowel control? N N  Managing your Medications? N N  Managing your Finances? N N  Housekeeping or managing your Housekeeping? Y N    Patient Care Team: Rema Fendt, NP as PCP - General (Nurse Practitioner) Nahser, Deloris Ping, MD as PCP - Cardiology (Cardiology) Nahser, Deloris Ping, MD as Consulting Physician (Cardiology)  Indicate any recent Medical Services you may have received from other than [facility] providers in the past year (date may be approximate).     Assessment:   This is a routine wellness examination for Cherrelle.  Hearing/Vision  screen Hearing  Screening - Comments:: Denies hearing issues  Vision Screening - Comments:: Regular eye exams   Goals Addressed             This Visit's Progress    Patient Stated       01/03/2024, wants to lose weight, go to a dentist and go to a kidney doctor       Depression Screen    01/03/2024   11:41 AM 11/20/2023   11:17 AM 10/15/2023   10:41 AM 09/03/2023    2:08 PM 01/15/2023   11:20 AM 08/02/2022    2:19 PM 02/28/2022    1:45 PM  PHQ 2/9 Scores  PHQ - 2 Score 1 0 0 2 0 0 0  PHQ- 9 Score 6 4  10        Fall Risk    01/03/2024    5:49 AM 12/17/2023    9:30 AM 11/20/2023   11:08 AM 09/03/2023   10:26 AM 01/15/2023   11:20 AM  Fall Risk   Falls in the past year? 0 0 1 0 0  Number falls in past yr: 0 0 0 0 0  Injury with Fall? 0 0 0 0 0  Risk for fall due to : Medication side effect No Fall Risks No Fall Risks No Fall Risks No Fall Risks  Follow up Falls prevention discussed;Falls evaluation completed Falls evaluation completed Falls evaluation completed  Falls evaluation completed    MEDICARE RISK AT HOME: Medicare Risk at Home Any stairs in or around the home?: (Patient-Rptd) Yes If so, are there any without handrails?: (Patient-Rptd) No Home free of loose throw rugs in walkways, pet beds, electrical cords, etc?: (Patient-Rptd) Yes Adequate lighting in your home to reduce risk of falls?: (Patient-Rptd) Yes Life alert?: (Patient-Rptd) No Use of a cane, walker or w/c?: (Patient-Rptd) No Grab bars in the bathroom?: (Patient-Rptd) No Shower chair or bench in shower?: (Patient-Rptd) No Elevated toilet seat or a handicapped toilet?: (Patient-Rptd) No  TIMED UP AND GO:  Was the test performed?  No    Cognitive Function:        01/03/2024   11:44 AM 01/15/2023   11:16 AM  6CIT Screen  What Year? 0 points 0 points  What month? 0 points 0 points  What time? 0 points 0 points  Count back from 20 0 points 0 points  Months in reverse 2 points 0 points  Repeat phrase 4  points 0 points  Total Score 6 points 0 points    Immunizations Immunization History  Administered Date(s) Administered   Influenza, Seasonal, Injecte, Preservative Fre 09/03/2023   PFIZER(Purple Top)SARS-COV-2 Vaccination 07/05/2020, 07/29/2020   Tdap 01/22/2023    TDAP status: Up to date  Flu Vaccine status: Up to date  Pneumococcal vaccine status: Due, Education has been provided regarding the importance of this vaccine. Advised may receive this vaccine at local pharmacy or Health Dept. Aware to provide a copy of the vaccination record if obtained from local pharmacy or Health Dept. Verbalized acceptance and understanding.  Covid-19 vaccine status: Information provided on how to obtain vaccines.   Qualifies for Shingles Vaccine? Yes   Zostavax completed No   Shingrix Completed?: No.    Education has been provided regarding the importance of this vaccine. Patient has been advised to call insurance company to determine out of pocket expense if they have not yet received this vaccine. Advised may also receive vaccine at local pharmacy or Health Dept. Verbalized acceptance and understanding.  Screening Tests Health Maintenance  Topic Date Due   Pneumococcal Vaccine 47-57 Years old (1 of 2 - PCV) Never done   Zoster Vaccines- Shingrix (1 of 2) 01/15/2024 (Originally 08/01/1985)   COVID-19 Vaccine (3 - Pfizer risk series) 09/23/2024 (Originally 08/26/2020)   Medicare Annual Wellness (AWV)  01/02/2025   MAMMOGRAM  10/10/2025   Cervical Cancer Screening (HPV/Pap Cotest)  01/22/2028   DTaP/Tdap/Td (2 - Td or Tdap) 01/21/2033   Colonoscopy  12/09/2033   INFLUENZA VACCINE  Completed   Hepatitis C Screening  Completed   HIV Screening  Completed   HPV VACCINES  Aged Out    Health Maintenance  Health Maintenance Due  Topic Date Due   Pneumococcal Vaccine 30-60 Years old (1 of 2 - PCV) Never done    Colorectal cancer screening: Type of screening: Colonoscopy. Completed 12/10/2023.  Repeat every 3 years  Mammogram status: Completed 10/11/2023. Repeat every year  Bone Density status: n/a  Lung Cancer Screening: (Low Dose CT Chest recommended if Age 31-80 years, 20 pack-year currently smoking OR have quit w/in 15years.) does not qualify.   Lung Cancer Screening Referral: no  Additional Screening:  Hepatitis C Screening: does qualify; Completed 01/12/2021  Vision Screening: Recommended annual ophthalmology exams for early detection of glaucoma and other disorders of the eye. Is the patient up to date with their annual eye exam?  Yes  Who is the provider or what is the name of the office in which the patient attends annual eye exams? Can't remember If pt is not established with a provider, would they like to be referred to a provider to establish care? No .   Dental Screening: Recommended annual dental exams for proper oral hygiene  Diabetic Foot Exam: n/a  Community Resource Referral / Chronic Care Management: CRR required this visit?  Yes   CCM required this visit?  No     Plan:     I have personally reviewed and noted the following in the patient's chart:   Medical and social history Use of alcohol, tobacco or illicit drugs  Current medications and supplements including opioid prescriptions. Patient is not currently taking opioid prescriptions. Functional ability and status Nutritional status Physical activity Advanced directives List of other physicians Hospitalizations, surgeries, and ER visits in previous 12 months Vitals Screenings to include cognitive, depression, and falls Referrals and appointments  In addition, I have reviewed and discussed with patient certain preventive protocols, quality metrics, and best practice recommendations. A written personalized care plan for preventive services as well as general preventive health recommendations were provided to patient.     Barb Merino, LPN   02/26/8118   After Visit Summary: (MyChart)  Due to this being a telephonic visit, the after visit summary with patients personalized plan was offered to patient via MyChart   Nurse Notes: none

## 2024-01-03 NOTE — Patient Instructions (Signed)
Janet [name] , Thank you for taking time to come for your Medicare Wellness Visit. I appreciate your ongoing commitment to your health goals. Please review the following plan we discussed and let me know if I can assist you in the future.   Referrals/Orders/Follow-Ups/Clinician Recommendations: none  This is a list of the screening recommended for you and due dates:  Health Maintenance  Topic Date Due   Pneumococcal Vaccination (1 of 2 - PCV) Never done   Zoster (Shingles) Vaccine (1 of 2) 01/15/2024*   COVID-19 Vaccine (3 - Pfizer risk series) 09/23/2024*   Medicare Annual Wellness Visit  01/02/2025   Mammogram  10/10/2025   Pap with HPV screening  01/22/2028   DTaP/Tdap/Td vaccine (2 - Td or Tdap) 01/21/2033   Colon Cancer Screening  12/09/2033   Flu Shot  Completed   Hepatitis C Screening  Completed   HIV Screening  Completed   HPV Vaccine  Aged Out  *Topic was postponed. The date shown is not the original due date.    Advanced directives: (ACP Link)Information on Advanced Care Planning can be found at [facility] [place] Advance Health Care Directives Advance Health Care Directives ([email or web address]   Next Medicare Annual Wellness Visit scheduled for next year: Yes  insert Preventive Care attachment Insert FALL PREVENTION attachment if needed

## 2024-01-04 ENCOUNTER — Telehealth: Payer: Self-pay | Admitting: *Deleted

## 2024-01-04 NOTE — Progress Notes (Signed)
Complex Care Management Note Care Guide Note  01/04/2024 Name: Janet [name] MRN: [id number] DOB: [birth year]  Janet [name] is a 58 y.o. year old female who is a primary care patient of Rema Fendt, NP . The community resource team was consulted for assistance with Food and  social Has food stamps will mail housing and and also some social newsletters   SDOH screenings and interventions completed:  Yes     SDOH Interventions Today    Flowsheet Row Most Recent Value  SDOH Interventions   Food Insecurity Interventions Community Resources Provided  [Gave her the app greater food finder]  Social Connections Interventions --  [Will mail some outside activities]        Care guide performed the following interventions: Patient provided with information about care guide support team and interviewed to confirm resource needs. Has food stamps will mail housing and food banks and also some social newsletters  Follow Up Plan:  No further follow up planned at this time. The patient has been provided with needed resources.  Encounter Outcome:  Patient Visit Completed  Dione Booze  [facility] HealthPopulation Health Care Guide  Direct Dial:[phone] Fax:[phone] Website: [email or web address]

## 2024-01-07 ENCOUNTER — Other Ambulatory Visit: Payer: Self-pay | Admitting: Surgery

## 2024-01-07 DIAGNOSIS — N6320 Unspecified lump in the left breast, unspecified quadrant: Secondary | ICD-10-CM

## 2024-01-08 ENCOUNTER — Telehealth (HOSPITAL_COMMUNITY): Payer: Self-pay

## 2024-01-08 NOTE — Telephone Encounter (Signed)
Therapist receives a call from [place] inquiring about an oupt rehabilitation program and requests a return call.  Therapist calls this a.m. and Vikki Ports answers.  Therapist confirms her identity through obtaining two identifiers.  Epsie says she found a [facility] program with [facility] which is what she wanted and will be going there. Therapist wishes her well.  Remigio Eisenmenger, MS, LMFT, LCAS 01-08-24

## 2024-01-26 ENCOUNTER — Ambulatory Visit (INDEPENDENT_AMBULATORY_CARE_PROVIDER_SITE_OTHER)

## 2024-01-26 ENCOUNTER — Encounter (HOSPITAL_COMMUNITY): Payer: Self-pay | Admitting: Emergency Medicine

## 2024-01-26 ENCOUNTER — Other Ambulatory Visit: Payer: Self-pay | Admitting: Cardiology

## 2024-01-26 ENCOUNTER — Ambulatory Visit (HOSPITAL_COMMUNITY)
Admission: EM | Admit: 2024-01-26 | Discharge: 2024-01-26 | Disposition: A | Discharge status: Home / Self Care | Attending: Physician Assistant | Admitting: Physician Assistant

## 2024-01-26 ENCOUNTER — Other Ambulatory Visit: Payer: Self-pay

## 2024-01-26 DIAGNOSIS — R059 Cough, unspecified: Secondary | ICD-10-CM | POA: Diagnosis not present

## 2024-01-26 DIAGNOSIS — R0602 Shortness of breath: Secondary | ICD-10-CM | POA: Diagnosis not present

## 2024-01-26 DIAGNOSIS — J069 Acute upper respiratory infection, unspecified: Secondary | ICD-10-CM | POA: Diagnosis not present

## 2024-01-26 DIAGNOSIS — J4521 Mild intermittent asthma with (acute) exacerbation: Secondary | ICD-10-CM

## 2024-01-26 DIAGNOSIS — R051 Acute cough: Secondary | ICD-10-CM

## 2024-01-26 MED ORDER — DOXYCYCLINE HYCLATE 100 MG PO CAPS: 100.0000 mg | ORAL_CAPSULE | Freq: Two times a day (BID) | ORAL | 0 refills | Status: DC

## 2024-01-26 MED ORDER — PREDNISONE 10 MG PO TABS: 40.0000 mg | ORAL_TABLET | Freq: Every day | ORAL | 0 refills | Status: AC

## 2024-01-26 MED ORDER — ALBUTEROL SULFATE (2.5 MG/3ML) 0.083% IN NEBU: 2.5000 mg | INHALATION_SOLUTION | RESPIRATORY_TRACT | 2 refills | Status: AC | PRN

## 2024-01-26 NOTE — ED Triage Notes (Signed)
 Pt c/o productive cough and SOB for the past 2 days.

## 2024-01-26 NOTE — ED Provider Notes (Signed)
 [facility]    CSN: [id number] Arrival date & time: 01/26/24  1306      History   Chief Complaint Chief Complaint  Patient presents with   Cough   Shortness of Breath    HPI Janet [name] is a 58 y.o. female.   Patient presents with productive cough and has mild wheezing that started about 2 days ago.  She reports he has a history of asthma.  She has been using her albuterol inhaler and nebulizer with temporary relief.  She denies fever, chills.  She does report some mild bodyaches.  She has been taking Mucinex with minimal relief.    Past Medical History:  Diagnosis Date   Allergy    Anxiety    Arthritis    Asthma    CHF (congestive heart failure) (HCC)    Congestive heart failure (CHF) (HCC)    Depression    Elevated brain natriuretic peptide (BNP) level    Hypertension    Lymphedema    SOB (shortness of breath)    Substance abuse (HCC)     Patient Active Problem List   Diagnosis Date Noted   Colon cancer screening 12/10/2023   Benign neoplasm of colon 12/10/2023   Vitamin D deficiency 09/04/2023   Prediabetes 08/03/2022   Acute systolic heart failure (HCC)    Essential hypertension 02/17/2021   Asthma    Lymphedema    Elevated brain natriuretic peptide (BNP) level    DOE (dyspnea on exertion) 07/28/2020   Morbid obesity (HCC) 07/28/2020    Past Surgical History:  Procedure Laterality Date   ABDOMINAL HYSTERECTOMY     BREAST BIOPSY Right    BREAST BIOPSY Left 10/16/2023   times 3   BREAST BIOPSY Left 10/16/2023   [place] LT BREAST BX W LOC DEV EA ADD LESION IMG BX SPEC US GUIDE 10/16/2023 [facility]   BREAST BIOPSY Left 10/16/2023   [place] LT BREAST BX W LOC DEV 1ST LESION IMG BX SPEC US GUIDE 10/16/2023 [facility]   BREAST BIOPSY Left 10/16/2023   [place] LT BREAST BX W LOC DEV EA ADD LESION IMG BX SPEC US GUIDE 10/16/2023 [facility]   CESAREAN SECTION     COLONOSCOPY WITH PROPOFOL N/A 12/10/2023   Procedure: COLONOSCOPY  WITH PROPOFOL;  Surgeon: Benancio Deeds, MD;  Location: [facility];  Service: Gastroenterology;  Laterality: N/A;   HEMOSTASIS CLIP PLACEMENT  12/10/2023   Procedure: HEMOSTASIS CLIP PLACEMENT;  Surgeon: Benancio Deeds, MD;  Location: [facility];  Service: Gastroenterology;;   HERNIA REPAIR     umbilical as a child   POLYPECTOMY  12/10/2023   Procedure: POLYPECTOMY;  Surgeon: Benancio Deeds, MD;  Location: [facility];  Service: Gastroenterology;;   RIGHT/LEFT HEART CATH AND CORONARY ANGIOGRAPHY N/A 04/01/2021   Procedure: RIGHT/LEFT HEART CATH AND CORONARY ANGIOGRAPHY;  Surgeon: Corky Crafts, MD;  Location: [facility] INVASIVE CV LAB;  Service: Cardiovascular;  Laterality: N/A;   TUBAL LIGATION      OB History   No obstetric history on file.      Home Medications    Prior to Admission medications   Medication Sig Start Date End Date Taking? Authorizing Provider  predniSONE (DELTASONE) 10 MG tablet Take 4 tablets (40 mg total) by mouth daily for 5 days. 01/26/24 01/31/24 Yes Ward, Tylene Fantasia, PA-C  albuterol (PROVENTIL) (2.5 MG/3ML) 0.083% nebulizer solution Take 3 mLs (2.5 mg total) by nebulization every 4 (four) hours as needed for wheezing or shortness  of breath. 01/26/24   Ward, Tylene Fantasia, PA-C  ARIPiprazole (ABILIFY) 10 MG tablet Take 10 mg by mouth daily. 01/04/23   [provider]  aspirin 81 MG chewable tablet Chew 81 mg by mouth daily.    [provider]  azithromycin (ZITHROMAX) 250 MG tablet Take 2 tabs PO day 1, then take 1 tab PO once daily Patient not taking: Reported on 12/11/2023 11/20/23   Mayers, Cari S, PA-C  benzonatate (TESSALON) 100 MG capsule Take 1-2 caps PO TID PRN Patient not taking: Reported on 12/11/2023 11/20/23   Mayers, Cari S, PA-C  Budeson-Glycopyrrol-Formoterol (BREZTRI AEROSPHERE) 160-9-4.8 MCG/ACT AERO Inhale 2 puffs into the lungs in the morning and at bedtime. 12/17/23   Rema Fendt, NP  carvedilol (COREG) 12.5 MG  tablet Take 1 tablet (12.5 mg total) by mouth 2 (two) times daily with a meal. 11/15/23   Sabharwal, Aditya, DO  dextromethorphan-guaiFENesin (MUCINEX DM) 30-600 MG 12hr tablet Take 1 tablet by mouth 2 (two) times daily as needed for cough.    [provider]  doxycycline (VIBRAMYCIN) 100 MG capsule Take 1 capsule (100 mg total) by mouth 2 (two) times daily. 01/26/24  Yes Ward, Shanda Bumps Z, PA-C  ENTRESTO 49-51 MG TAKE ONE TABLET BY MOUTH TWICE DAILY @ 9AM-5PM 01/24/23   Sabharwal, Aditya, DO  fluticasone (FLONASE) 50 MCG/ACT nasal spray Place 2 sprays into both nostrils daily. Patient taking differently: Place 2 sprays into both nostrils daily as needed for allergies or rhinitis. 01/18/23   Rema Fendt, NP  furosemide (LASIX) 20 MG tablet TAKE ONE TABLET (20 MG) BY MOUTH DAILY AT 9AM Patient taking differently: Take 20 mg by mouth daily as needed for fluid or edema. 11/15/23   Sabharwal, Aditya, DO  hydrOXYzine (VISTARIL) 50 MG capsule Take 100 mg by mouth at bedtime. 01/02/23   [provider]  montelukast (SINGULAIR) 10 MG tablet TAKE 1 TABLET(10 MG) BY MOUTH AT BEDTIME 03/30/23   Rema Fendt, NP  Na Sulfate-K Sulfate-Mg Sulfate concentrate 17.5-3.13-1.6 GM/177ML SOLN SMARTSIG:1 Kit(s) By Mouth Once Patient not taking: Reported on 12/11/2023 11/19/23   [provider]  spironolactone (ALDACTONE) 25 MG tablet TAKE 1 TABLET(25 MG) BY MOUTH DAILY 04/12/23   Sabharwal, Aditya, DO  traZODone (DESYREL) 150 MG tablet Take 150 mg by mouth at bedtime.    [provider]    Family History Family History  Problem Relation Age of Onset   Colon polyps Mother    Hypertension Mother    Stroke Mother    Deep vein thrombosis Mother    Congestive Heart Failure Mother    Arthritis Mother    Asthma Mother    Depression Mother    Other Father        history unknown   Obesity Sister    Diabetes Sister    Obesity Brother    Congestive Heart Failure Maternal Grandmother     Colon cancer Neg Hx    Esophageal cancer Neg Hx    Rectal cancer Neg Hx    Stomach cancer Neg Hx     Social History Social History   Tobacco Use   Smoking status: Never    Passive exposure: Current   Smokeless tobacco: Never  Vaping Use   Vaping status: Never Used  Substance Use Topics   Alcohol use: Not Currently    Alcohol/week: 3.0 standard drinks of alcohol    Types: 3 Shots of liquor per week    Comment: 1 pint per  week   Drug use: Yes    Frequency: 1.0 times per week    Types: Marijuana    Comment: uses marijuana twice a week     Allergies   Clarithromycin, Penicillins, and Penicillin g   Review of Systems Review of Systems  Constitutional:  Negative for chills and fever.  HENT:  Positive for congestion. Negative for ear pain and sore throat.   Eyes:  Negative for pain and visual disturbance.  Respiratory:  Positive for cough and wheezing. Negative for shortness of breath.   Cardiovascular:  Negative for chest pain and palpitations.  Gastrointestinal:  Negative for abdominal pain and vomiting.  Genitourinary:  Negative for dysuria and hematuria.  Musculoskeletal:  Negative for arthralgias and back pain.  Skin:  Negative for color change and rash.  Neurological:  Negative for seizures and syncope.  All other systems reviewed and are negative.    Physical Exam Triage Vital Signs ED Triage Vitals  Encounter Vitals Group     BP 01/26/24 1329 99/67     Systolic BP Percentile --      Diastolic BP Percentile --      Pulse Rate 01/26/24 1329 69     Resp 01/26/24 1329 (!) 22     Temp 01/26/24 1329 98.4 F (36.9 C)     Temp Source 01/26/24 1329 Oral     SpO2 01/26/24 1329 96 %     Weight --      Height --      Head Circumference --      Peak Flow --      Pain Score 01/26/24 1330 7     Pain Loc --      Pain Education --      Exclude from Growth Chart --    No data found.  Updated Vital Signs BP 99/67 (BP Location: Right Arm)   Pulse 69   Temp  98.4 F (36.9 C) (Oral)   Resp (!) 22   SpO2 96%   Visual Acuity Right Eye Distance:   Left Eye Distance:   Bilateral Distance:    Right Eye Near:   Left Eye Near:    Bilateral Near:     Physical Exam Vitals and nursing note reviewed.  Constitutional:      General: She is not in acute distress.    Appearance: She is well-developed.  HENT:     Head: Normocephalic and atraumatic.  Eyes:     Conjunctiva/sclera: Conjunctivae normal.  Cardiovascular:     Rate and Rhythm: Normal rate and regular rhythm.     Heart sounds: No murmur heard. Pulmonary:     Effort: Pulmonary effort is normal. No respiratory distress.     Breath sounds: Examination of the right-middle field reveals rhonchi. Examination of the left-middle field reveals rhonchi. Rhonchi present.  Abdominal:     Palpations: Abdomen is soft.     Tenderness: There is no abdominal tenderness.  Musculoskeletal:        General: No swelling.     Cervical back: Neck supple.  Skin:    General: Skin is warm and dry.     Capillary Refill: Capillary refill takes less than 2 seconds.  Neurological:     Mental Status: She is alert.  Psychiatric:        Mood and Affect: Mood normal.      UC Treatments / Results  Labs (all labs ordered are listed, but only abnormal results are displayed) Labs Reviewed - No data  to display  EKG   Radiology DG Chest 2 View Result Date: 01/26/2024 CLINICAL DATA:  Cough and shortness of breath. EXAM: CHEST - 2 VIEW COMPARISON:  X-ray 10/06/2023. FINDINGS: No consolidation, pneumothorax or effusion. Normal cardiopericardial silhouette without edema. Degenerative changes seen along the spine. IMPRESSION: No acute cardiopulmonary disease. Electronically Signed   By: Karen Kays M.D.   On: 01/26/2024 14:52    Procedures Procedures (including critical care time)  Medications Ordered in UC Medications - No data to display  Initial Impression / Assessment and Plan / UC Course  I have  reviewed the triage vital signs and the nursing notes.  Pertinent labs & imaging results that were available during my care of the patient were reviewed by me and considered in my medical decision making (see chart for details).     Will treat for acute asthma exacerbation with prednisone.  Antibiotic also prescribed.  Patient needs refill of nebulizer solution.  Supportive care discussed.  Return precautions discussed. Final Clinical Impressions(s) / UC Diagnoses   Final diagnoses:  Acute cough  Acute upper respiratory infection  Mild intermittent asthma with acute exacerbation     Discharge Instructions      Take antibiotic as prescribed Use albuterol inhaler or nebulizer as needed Take prednisone as prescribed Continue with Mucinex and Flonase. Drink plenty of fluids and rest. Return if you have no improvement or worsening symptoms.     ED Prescriptions     Medication Sig Dispense Auth. Provider   albuterol (PROVENTIL) (2.5 MG/3ML) 0.083% nebulizer solution Take 3 mLs (2.5 mg total) by nebulization every 4 (four) hours as needed for wheezing or shortness of breath. 75 mL Ward, Tylene Fantasia, PA-C   doxycycline (VIBRAMYCIN) 100 MG capsule Take 1 capsule (100 mg total) by mouth 2 (two) times daily. 20 capsule Ward, Shanda Bumps Z, PA-C   predniSONE (DELTASONE) 10 MG tablet Take 4 tablets (40 mg total) by mouth daily for 5 days. 20 tablet Ward, Tylene Fantasia, PA-C      PDMP not reviewed this encounter.   Ward, Tylene Fantasia, PA-C 01/26/24 1501

## 2024-01-26 NOTE — Discharge Instructions (Signed)
 Take antibiotic as prescribed Use albuterol inhaler or nebulizer as needed Take prednisone as prescribed Continue with Mucinex and Flonase. Drink plenty of fluids and rest. Return if you have no improvement or worsening symptoms.

## 2024-02-04 NOTE — Progress Notes (Signed)
 URI on 3/8- needs procedure delayed 4 weeks per anesthesia guidelines. Tonya at Dr. Rosezena Sensor office aware

## 2024-02-22 NOTE — Progress Notes (Signed)
 Surgical Instructions   Your procedure is scheduled on February 28, 2024. Report to [facility] Main Entrance "A" at 12:15 A.M., then check in with the Admitting office. Any questions or running late day of surgery: call [phone]  Questions prior to your surgery date: call [phone], Monday-Friday, 8am-4pm. If you experience any cold or flu symptoms such as cough, fever, chills, shortness of breath, etc. between now and your scheduled surgery, please notify us at the above number.     Remember:  Do not eat after midnight the night before your surgery   You may drink clear liquids until 11:15 the morning of your surgery.   Clear liquids allowed are: Water, Non-Citrus Juices (without pulp), Carbonated Beverages, Clear Tea (no milk, honey, etc.), Black Coffee Only (NO MILK, CREAM OR POWDERED CREAMER of any kind), and Gatorade.    Take these medicines the morning of surgery with A SIP OF WATER  ARIPiprazole (ABILIFY)  BREZTRI AEROSPHERE  carvedilol (COREG)  doxycycline (VIBRAMYCIN)      May take these medicines IF NEEDED: albuterol (PROVENTIL)  nebulizer solution  fluticasone (FLONASE)   One week prior to surgery, STOP taking any Aspirin (unless otherwise instructed by your surgeon) Aleve, Naproxen, Ibuprofen, Motrin, Advil, Goody's, BC's, all herbal medications, fish oil, and non-prescription vitamins.                     Do NOT Smoke (Tobacco/Vaping) for 24 hours prior to your procedure.  If you use a CPAP at night, you may bring your mask/headgear for your overnight stay.   You will be asked to remove any contacts, glasses, piercing's, hearing aid's, dentures/partials prior to surgery. Please bring cases for these items if needed.    Patients discharged the day of surgery will not be allowed to drive home, and someone needs to stay with them for 24 hours.  SURGICAL WAITING ROOM VISITATION Patients may have no more than 2 support people in the waiting area - these  visitors may rotate.   Pre-op nurse will coordinate an appropriate time for 1 ADULT support person, who may not rotate, to accompany patient in pre-op.  Children under the age of 1 must have an adult with them who is not the patient and must remain in the main waiting area with an adult.  If the patient needs to stay at the hospital during part of their recovery, the visitor guidelines for inpatient rooms apply.  Please refer to the [facility] website for the visitor guidelines for any additional information.   If you received a COVID test during your pre-op visit  it is requested that you wear a mask when out in public, stay away from anyone that may not be feeling well and notify your surgeon if you develop symptoms. If you have been in contact with anyone that has tested positive in the last 10 days please notify you surgeon.      Pre-operative CHG Bathing Instructions   You can play a key role in reducing the risk of infection after surgery. Your skin needs to be as free of germs as possible. You can reduce the number of germs on your skin by washing with CHG (chlorhexidine gluconate) soap before surgery. CHG is an antiseptic soap that kills germs and continues to kill germs even after washing.   DO NOT use if you have an allergy to chlorhexidine/CHG or antibacterial soaps. If your skin becomes reddened or irritated, stop using the CHG and notify one of  our RNs at [phone].              TAKE A SHOWER THE NIGHT BEFORE SURGERY AND THE DAY OF SURGERY    Please keep in mind the following:  DO NOT shave, including legs and underarms, 48 hours prior to surgery.   You may shave your face before/day of surgery.  Place clean sheets on your bed the night before surgery Use a clean washcloth (not used since being washed) for each shower. DO NOT sleep with pet's night before surgery.  CHG Shower Instructions:  Wash your face and private area with normal soap. If you choose to wash your  hair, wash first with your normal shampoo.  After you use shampoo/soap, rinse your hair and body thoroughly to remove shampoo/soap residue.  Turn the water OFF and apply half the bottle of CHG soap to a CLEAN washcloth.  Apply CHG soap ONLY FROM YOUR NECK DOWN TO YOUR TOES (washing for 3-5 minutes)  DO NOT use CHG soap on face, private areas, open wounds, or sores.  Pay special attention to the area where your surgery is being performed.  If you are having back surgery, having someone wash your back for you may be helpful. Wait 2 minutes after CHG soap is applied, then you may rinse off the CHG soap.  Pat dry with a clean towel  Put on clean pajamas    Additional instructions for the day of surgery: DO NOT APPLY any lotions, deodorants, cologne, or perfumes.   Do not wear jewelry or makeup Do not wear nail polish, gel polish, artificial nails, or any other type of covering on natural nails (fingers and toes) Do not bring valuables to the hospital. [facility] is not responsible for valuables/personal belongings. Put on clean/comfortable clothes.  Please brush your teeth.  Ask your nurse before applying any prescription medications to the skin.

## 2024-02-25 ENCOUNTER — Other Ambulatory Visit: Payer: Self-pay

## 2024-02-25 ENCOUNTER — Encounter (HOSPITAL_COMMUNITY): Payer: Self-pay

## 2024-02-25 ENCOUNTER — Encounter (HOSPITAL_COMMUNITY)
Admission: RE | Admit: 2024-02-25 | Discharge: 2024-02-25 | Disposition: A | Discharge status: Home / Self Care | Source: Ambulatory Visit | Attending: Surgery | Admitting: Surgery

## 2024-02-25 ENCOUNTER — Ambulatory Visit: Payer: Self-pay | Admitting: Surgery

## 2024-02-25 VITALS — BP 127/94 | HR 73 | Temp 98.9°F | Resp 18 | Ht 68.0 in | Wt 269.7 lb

## 2024-02-25 DIAGNOSIS — I110 Hypertensive heart disease with heart failure: Secondary | ICD-10-CM | POA: Diagnosis not present

## 2024-02-25 DIAGNOSIS — D242 Benign neoplasm of left breast: Secondary | ICD-10-CM | POA: Insufficient documentation

## 2024-02-25 DIAGNOSIS — Z01818 Encounter for other preprocedural examination: Secondary | ICD-10-CM

## 2024-02-25 DIAGNOSIS — N6321 Unspecified lump in the left breast, upper outer quadrant: Secondary | ICD-10-CM

## 2024-02-25 DIAGNOSIS — I428 Other cardiomyopathies: Secondary | ICD-10-CM | POA: Diagnosis not present

## 2024-02-25 DIAGNOSIS — F419 Anxiety disorder, unspecified: Secondary | ICD-10-CM | POA: Diagnosis not present

## 2024-02-25 DIAGNOSIS — I5020 Unspecified systolic (congestive) heart failure: Secondary | ICD-10-CM | POA: Diagnosis not present

## 2024-02-25 DIAGNOSIS — J45909 Unspecified asthma, uncomplicated: Secondary | ICD-10-CM | POA: Diagnosis not present

## 2024-02-25 DIAGNOSIS — F1290 Cannabis use, unspecified, uncomplicated: Secondary | ICD-10-CM | POA: Insufficient documentation

## 2024-02-25 DIAGNOSIS — E669 Obesity, unspecified: Secondary | ICD-10-CM | POA: Diagnosis not present

## 2024-02-25 DIAGNOSIS — Z01812 Encounter for preprocedural laboratory examination: Secondary | ICD-10-CM | POA: Insufficient documentation

## 2024-02-25 LAB — CBC
HCT: 45.5 % (ref 36.0–46.0)
Hemoglobin: 14.5 g/dL (ref 12.0–15.0)
MCH: 26.9 pg (ref 26.0–34.0)
MCHC: 31.9 g/dL (ref 30.0–36.0)
MCV: 84.4 fL (ref 80.0–100.0)
Platelets: 259 10*3/uL (ref 150–400)
RBC: 5.39 MIL/uL — ABNORMAL HIGH (ref 3.87–5.11)
RDW: 15.2 % (ref 11.5–15.5)
WBC: 6.1 10*3/uL (ref 4.0–10.5)
nRBC: 0 % (ref 0.0–0.2)

## 2024-02-25 LAB — BASIC METABOLIC PANEL WITH GFR
Anion gap: 8 (ref 5–15)
BUN: 13 mg/dL (ref 6–20)
CO2: 26 mmol/L (ref 22–32)
Calcium: 9.1 mg/dL (ref 8.9–10.3)
Chloride: 107 mmol/L (ref 98–111)
Creatinine, Ser: 1.21 mg/dL — ABNORMAL HIGH (ref 0.44–1.00)
GFR, Estimated: 53 mL/min — ABNORMAL LOW (ref >60)
Glucose, Bld: 105 mg/dL — ABNORMAL HIGH (ref 70–99)
Potassium: 4.1 mmol/L (ref 3.5–5.1)
Sodium: 141 mmol/L (ref 135–145)

## 2024-02-25 NOTE — Progress Notes (Signed)
 PCP - Iva Boop Cardiologist - Dorthula Nettles, DO  PPM/ICD - denies Device Orders -  Rep Notified -   Chest x-ray - 01/26/24 EKG - 10/06/23 Stress Test -05/30/17 CE ECHO - 12/14/22 Cardiac Cath - 04/01/21  Sleep Study - denies CPAP -   Fasting Blood Sugar - na Checks Blood Sugar _____ times a day  Last dose of GLP1 agonist-  na GLP1 instructions:   Blood Thinner Instructions:na Aspirin Instructions:Pt reports that she has not taken aspirin since February.  ERAS Protcol -clears until 1115 PRE-SURGERY Ensure or G2-   COVID TEST- na   Anesthesia review: yes- Pt treated for upper respiratory infection 01/27/24. Completed course of Doxycline. Reprts some coughing and congestion at this time but reports it is improved since she went to Urgent Care in March. She says her symptoms now are dure to the pollen and allergies. She denies fever,chills,body aches,or sore throat.  Patient denies shortness of breath, fever,and chest pain at PAT appointment   All instructions explained to the patient, with a verbal understanding of the material. Patient agrees to go over the instructions while at home for a better understanding. Patient also instructed to self quarantine after being tested for COVID-19. The opportunity to ask questions was provided.

## 2024-02-25 NOTE — Progress Notes (Signed)
 Surgical Instructions   Your procedure is scheduled on February 28, 2024. Report to [facility] Main Entrance "A" at 12:15 PM., then check in with the Admitting office. Any questions or running late day of surgery: call [phone]  Questions prior to your surgery date: call [phone], Monday-Friday, 8am-4pm. If you experience any cold or flu symptoms such as cough, fever, chills, shortness of breath, etc. between now and your scheduled surgery, please notify us at the above number.     Remember:  Do not eat after midnight the night before your surgery   You may drink clear liquids until 11:15 the morning of your surgery.   Clear liquids allowed are: Water, Non-Citrus Juices (without pulp), Carbonated Beverages, Clear Tea (no milk, honey, etc.), Black Coffee Only (NO MILK, CREAM OR POWDERED CREAMER of any kind), and Gatorade.    Take these medicines the morning of surgery with A SIP OF WATER  ARIPiprazole (ABILIFY)  BREZTRI AEROSPHERE  carvedilol (COREG)  doxycycline (VIBRAMYCIN)      May take these medicines IF NEEDED: albuterol (PROVENTIL)  nebulizer solution  fluticasone (FLONASE)   One week prior to surgery, STOP taking any Aspirin (unless otherwise instructed by your surgeon) Aleve, Naproxen, Ibuprofen, Motrin, Advil, Goody's, BC's, all herbal medications, fish oil, and non-prescription vitamins.                     Do NOT Smoke (Tobacco/Vaping) for 24 hours prior to your procedure.  If you use a CPAP at night, you may bring your mask/headgear for your overnight stay.   You will be asked to remove any contacts, glasses, piercing's, hearing aid's, dentures/partials prior to surgery. Please bring cases for these items if needed.    Patients discharged the day of surgery will not be allowed to drive home, and someone needs to stay with them for 24 hours.  SURGICAL WAITING ROOM VISITATION Patients may have no more than 2 support people in the waiting area - these visitors  may rotate.   Pre-op nurse will coordinate an appropriate time for 1 ADULT support person, who may not rotate, to accompany patient in pre-op.  Children under the age of 43 must have an adult with them who is not the patient and must remain in the main waiting area with an adult.  If the patient needs to stay at the hospital during part of their recovery, the visitor guidelines for inpatient rooms apply.  Please refer to the [facility] website for the visitor guidelines for any additional information.   If you received a COVID test during your pre-op visit  it is requested that you wear a mask when out in public, stay away from anyone that may not be feeling well and notify your surgeon if you develop symptoms. If you have been in contact with anyone that has tested positive in the last 10 days please notify you surgeon.      Pre-operative CHG Bathing Instructions   You can play a key role in reducing the risk of infection after surgery. Your skin needs to be as free of germs as possible. You can reduce the number of germs on your skin by washing with CHG (chlorhexidine gluconate) soap before surgery. CHG is an antiseptic soap that kills germs and continues to kill germs even after washing.   DO NOT use if you have an allergy to chlorhexidine/CHG or antibacterial soaps. If your skin becomes reddened or irritated, stop using the CHG and notify one of  our RNs at [phone].              TAKE A SHOWER THE NIGHT BEFORE SURGERY AND THE DAY OF SURGERY    Please keep in mind the following:  DO NOT shave, including legs and underarms, 48 hours prior to surgery.   You may shave your face before/day of surgery.  Place clean sheets on your bed the night before surgery Use a clean washcloth (not used since being washed) for each shower. DO NOT sleep with pet's night before surgery.  CHG Shower Instructions:  Wash your face and private area with normal soap. If you choose to wash your hair, wash  first with your normal shampoo.  After you use shampoo/soap, rinse your hair and body thoroughly to remove shampoo/soap residue.  Turn the water OFF and apply half the bottle of CHG soap to a CLEAN washcloth.  Apply CHG soap ONLY FROM YOUR NECK DOWN TO YOUR TOES (washing for 3-5 minutes)  DO NOT use CHG soap on face, private areas, open wounds, or sores.  Pay special attention to the area where your surgery is being performed.  If you are having back surgery, having someone wash your back for you may be helpful. Wait 2 minutes after CHG soap is applied, then you may rinse off the CHG soap.  Pat dry with a clean towel  Put on clean pajamas    Additional instructions for the day of surgery: DO NOT APPLY any lotions, deodorants, cologne, or perfumes.   Do not wear jewelry or makeup Do not wear nail polish, gel polish, artificial nails, or any other type of covering on natural nails (fingers and toes) Do not bring valuables to the hospital. [facility] is not responsible for valuables/personal belongings. Put on clean/comfortable clothes.  Please brush your teeth.  Ask your nurse before applying any prescription medications to the skin.

## 2024-02-26 ENCOUNTER — Encounter (HOSPITAL_COMMUNITY): Payer: Self-pay

## 2024-02-26 NOTE — Progress Notes (Signed)
 Anesthesia Chart Review:  Case: [id number] Date/Time: 02/28/24 1400   Procedure: LEFT BREAST SEED LUMPECTOMY,  3 SEEDS (Left: Breast)   Anesthesia type: General   Pre-op diagnosis: LEFT PAPILLOMA, MASS   [facility] ROOM 02 / [facility]   Surgeons: Harriette Bouillon, MD       DISCUSSION: Patient is a 58 year old female scheduled for the above procedure. Per 11/12/23 consult note by Dr. Wendie Agreste, she had an abnormal left breast mammogram with 3 areas of concern with some left breast pain.  Core biopsies showed papilloma with 2 other areas showing fibrocystic type changes.  She opted to proceed with left breast lumpectomy of the left breast papilloma. Surgery was scheduled at another [facility] facility in March but  delayed after she was treated for URI and asthma exacerbation on 01/26/24.  She completed doxycycline.  She reported some symptoms related to allergies but otherwise denied sick symptoms.  History includes never smoker (passive smoke exposure), HTN, asthma, dyspnea, HFrEF, nonischemic cardiomyopathy (diagnosed 03/2021), anxiety, hysterectomy, substance abuse (prior cocaine, THC), obesity. Current + Marijuana use.  She underwent colonoscopy on 11/29/23.   Last cardiology visit noted was on 01/08/23 with Dr. Gasper Lloyd for HF follow-up. She was referred to the HF Clinic by Dr. Elease Hashimoto after her LVEF failed to improve on GDMT.  CHF and NICM diagnosed in May 2022. UDS + for cocaine and THC. LVEF 30-35%. RHC/LHC 04/01/21 showed no CAD, mild pulmonary hypertension, no aortic stenosis. LVEF 40% in June 2023, but otherwsie has remained at 30-35%. CCTA on 10/25/21 showed CAC 0 with no CAD. Cardiac MRI 08/07/22 showed LVEF 23%, moderate LVE/LVH with abnormal septal motion, global hypokinesis, LVEF 23%, mild non Citic mid myocardial gadolinium uptake in anterior wall, low normal RVEF 45%. Last TTE on 12/14/22 showed LVEF 30 to 35%, LV global hypokinesis, moderately dilated LV internal cavity, grade 1 diastolic  dysfunction, normal RV systolic function, mild AR, ascending aorta 40 mm. Relapses in substance abuse thought to be a contributing factor in persistent LV dysfunction.  He continued Entresto, Coreg, spironolactone, Farxiga, and increased Lasix x 3 days. He ordered PFTs and  4 months follow-up had been planned.  Encouraged her to refrain from illicit substance use.  By current medication list she is no longer on Farxiga but remains on Coreg 12.5 mg twice daily, Lasix 20 mg daily as needed, Emtresto 49-51 mg twice daily, Aldactone 25 mg daily.  She reports not taking aspirin since February.  Cardiac history reivewed with anesthesiologist Jairo Ben, MD. RSL scheduled for 02/27/24 at 1:00 PM. Anesthesia team to evaluate on the day of surgery.   VS: BP (!) 127/94   Pulse 73   Temp 37.2 C   Resp 18   Ht 5\' 8"  (1.727 m)   Wt 122.3 kg   SpO2 98%   BMI 41.01 kg/m    PROVIDERS: Rema Fendt, NP is PCP Dorthula Nettles, DO is HF cardiologist Kristeen Miss, MD is cardiologist, last visit 02/03/22. Vilma Meckel, MD is pulmonologist. Last visit 02/27/23.   LABS: Labs reviewed: Acceptable for surgery. (all labs ordered are listed, but only abnormal results are displayed)  Labs Reviewed  BASIC METABOLIC PANEL WITH GFR - Abnormal; Notable for the following components:      Result Value   Glucose, Bld 105 (*)    Creatinine, Ser 1.21 (*)    GFR, Estimated 53 (*)    All other components within normal limits  CBC - Abnormal; Notable for the following components:  RBC 5.39 (*)    All other components within normal limits    PFTs 02/01/23: FVC 1.79 (45%), post 2.28 (57%). FEV1 1.03 (33%), post 1.49 (48%). DLCO unc 19.53  (82%), cor 18.82 (79%). Severe obstructive airways disease-asthmatic type Significant response to bronchodilator Air trapping without overinflation Normal diffusion   IMAGES: CXR 3/8/'25: FINDINGS: No consolidation, pneumothorax or effusion. Normal  cardiopericardial silhouette without edema. Degenerative changes seen along the spine. IMPRESSION: No acute cardiopulmonary disease.  CT Abd/pelvis 12/11/23: IMPRESSION: - No No evidence of appendicitis or other acute findings. - 2.6 cm lobulated soft tissue mass in inferior left breast. Recommend correlation with recent breast biopsy results.    EKG: 10/06/23:  Sinus rhythm with Premature supraventricular complexes Incomplete right bundle branch block Moderate voltage criteria for LVH, may be normal variant ( R in aVL , Cornell product ) Nonspecific T wave abnormality Prolonged QT Abnormal ECG When compared with ECG of 04-Jun-2023 11:32, No significant change since last tracing Confirmed by Meridee Score [phone]) on 10/06/2023 3:45:22 PM   CV: Echo 12/14/22: IMPRESSIONS   1. Left ventricular ejection fraction, by estimation, is 30 to 35%. The  left ventricle has moderately decreased function. The left ventricle  demonstrates global hypokinesis. The left ventricular internal cavity size  was moderately dilated. Left  ventricular diastolic parameters are consistent with Grade I diastolic  dysfunction (impaired relaxation).   2. Right ventricular systolic function is normal. The right ventricular  size is normal.   3. The mitral valve is normal in structure. No evidence of mitral valve  regurgitation. No evidence of mitral stenosis.   4. The aortic valve is tricuspid. Aortic valve regurgitation is mild. No  aortic stenosis is present.   5. Aortic dilatation noted. There is mild dilatation of the ascending  aorta, measuring 40 mm.   6. The inferior vena cava is normal in size with greater than 50%  respiratory variability, suggesting right atrial pressure of 3 mmHg.  - Comparison: LVEF 40% 05/16/22, 30-35% 09/22/21 & 03/30/21, 46% 08/12/10   Cardiac MRI 08/07/22: MPRESSION: 1. Moderate LVE/LVH with abnormal septal motion and global hypokinesis LVEF 23% 2. Mild non specific  mid myocardial gadolinium uptake in anterior wall 3.  Low normal RVEF 45% 4.  Tri leaflet AV with mild AR Regurgitant fraction 19% 5.  Mild Ascending thoracic aorta/sinus dilatation 3.8 cm    CT Coronary 10/25/21: IMPRESSION: 1. Coronary calcium score of 0. This was 0 percentile for age-, race-, and sex-matched controls. 2. Normal coronary origin with right dominance. 3. No evidence of CAD. 4. Consider non-cardiac causes of chest pain. 5. Ascending aorta is mildly dilated.   RHC/LHC 04/01/21: LV end diastolic pressure is moderately elevated. There is no aortic valve stenosis. Hemodynamic findings consistent with mild pulmonary hypertension. Ao sat 99%, PA sat 73%, mean PA pressure 33 mmHg, mean PCWP 22 mmHg, cardiac output 6.2 L/min, cardiac index 2.6. No angiographically apparent coronary artery disease. Significant tortuosity noted in the RCA and the circumflex.   Continue diuresis.  She will need aggressive medical therapy for nonischemic cardiomyopathy.   Past Medical History:  Diagnosis Date   Allergy    Anxiety    Arthritis    Asthma    CHF (congestive heart failure) (HCC)    Congestive heart failure (CHF) (HCC)    Depression    Elevated brain natriuretic peptide (BNP) level    Hypertension    Lymphedema    Nonischemic cardiomyopathy (HCC) 03/2021   SOB (shortness of breath)  Substance abuse [facility])     Past Surgical History:  Procedure Laterality Date   ABDOMINAL HYSTERECTOMY     BREAST BIOPSY Right    BREAST BIOPSY Left 10/16/2023   times 3   BREAST BIOPSY Left 10/16/2023   [place] LT BREAST BX W LOC DEV EA ADD LESION IMG BX SPEC US GUIDE 10/16/2023 [facility]   BREAST BIOPSY Left 10/16/2023   [place] LT BREAST BX W LOC DEV 1ST LESION IMG BX SPEC US GUIDE 10/16/2023 [facility]   BREAST BIOPSY Left 10/16/2023   [place] LT BREAST BX W LOC DEV EA ADD LESION IMG BX SPEC US GUIDE 10/16/2023 [facility]   CESAREAN SECTION     COLONOSCOPY WITH PROPOFOL  N/A 12/10/2023   Procedure: COLONOSCOPY WITH PROPOFOL;  Surgeon: Benancio Deeds, MD;  Location: [facility];  Service: Gastroenterology;  Laterality: N/A;   HEMOSTASIS CLIP PLACEMENT  12/10/2023   Procedure: HEMOSTASIS CLIP PLACEMENT;  Surgeon: Benancio Deeds, MD;  Location: [facility];  Service: Gastroenterology;;   HERNIA REPAIR     umbilical as a child   POLYPECTOMY  12/10/2023   Procedure: POLYPECTOMY;  Surgeon: Benancio Deeds, MD;  Location: [facility];  Service: Gastroenterology;;   RIGHT/LEFT HEART CATH AND CORONARY ANGIOGRAPHY N/A 04/01/2021   Procedure: RIGHT/LEFT HEART CATH AND CORONARY ANGIOGRAPHY;  Surgeon: Corky Crafts, MD;  Location: [facility] LAB;  Service: Cardiovascular;  Laterality: N/A;   TUBAL LIGATION      MEDICATIONS:  albuterol (PROVENTIL) (2.5 MG/3ML) 0.083% nebulizer solution   ARIPiprazole (ABILIFY) 10 MG tablet   aspirin 81 MG chewable tablet   azithromycin (ZITHROMAX) 250 MG tablet   benzonatate (TESSALON) 100 MG capsule   Budeson-Glycopyrrol-Formoterol (BREZTRI AEROSPHERE) 160-9-4.8 MCG/ACT AERO   carvedilol (COREG) 12.5 MG tablet   dextromethorphan-guaiFENesin (MUCINEX DM) 30-600 MG 12hr tablet   doxycycline (VIBRAMYCIN) 100 MG capsule   fluticasone (FLONASE) 50 MCG/ACT nasal spray   furosemide (LASIX) 20 MG tablet   hydrOXYzine (VISTARIL) 50 MG capsule   montelukast (SINGULAIR) 10 MG tablet   Na Sulfate-K Sulfate-Mg Sulfate concentrate 17.5-3.13-1.6 GM/177ML SOLN   sacubitril-valsartan (ENTRESTO) 49-51 MG   spironolactone (ALDACTONE) 25 MG tablet   traZODone (DESYREL) 150 MG tablet   No current facility-administered medications for this encounter.     Shonna Chock, PA-C Surgical Short Stay/Anesthesiology [facility] Phone [phone] [facility] Phone [phone] 02/26/2024 6:10 PM

## 2024-02-26 NOTE — Anesthesia Preprocedure Evaluation (Signed)
 Anesthesia Evaluation  Patient identified by MRN, date of birth, ID band Patient awake    Reviewed: Allergy & Precautions, NPO status , Patient's Chart, lab work & pertinent test results, reviewed documented beta blocker date and time   Airway Mallampati: III  TM Distance: >3 FB Neck ROM: Full    Dental  (+) Missing, Dental Advisory Given,    Pulmonary asthma    Pulmonary exam normal breath sounds clear to auscultation       Cardiovascular hypertension, Pt. on home beta blockers and Pt. on medications +CHF  Normal cardiovascular exam Rhythm:Regular Rate:Normal  TTE 2024  1. Left ventricular ejection fraction, by estimation, is 30 to 35%. The  left ventricle has moderately decreased function. The left ventricle  demonstrates global hypokinesis. The left ventricular internal cavity size  was moderately dilated. Left  ventricular diastolic parameters are consistent with Grade I diastolic  dysfunction (impaired relaxation).   2. Right ventricular systolic function is normal. The right ventricular  size is normal.   3. The mitral valve is normal in structure. No evidence of mitral valve  regurgitation. No evidence of mitral stenosis.   4. The aortic valve is tricuspid. Aortic valve regurgitation is mild. No  aortic stenosis is present.   5. Aortic dilatation noted. There is mild dilatation of the ascending  aorta, measuring 40 mm.   6. The inferior vena cava is normal in size with greater than 50%  respiratory variability, suggesting right atrial pressure of 3 mmHg.    Cath 2022  LV end diastolic pressure is moderately elevated.  There is no aortic valve stenosis.  Hemodynamic findings consistent with mild pulmonary hypertension.  Ao sat 99%, PA sat 73%, mean PA pressure 33 mmHg, mean PCWP 22 mmHg, cardiac output 6.2 L/min, cardiac index 2.6.  No angiographically apparent coronary artery disease. Significant tortuosity  noted in the RCA and the circumflex.      Neuro/Psych  PSYCHIATRIC DISORDERS Anxiety Depression    negative neurological ROS     GI/Hepatic negative GI ROS,,,(+)     substance abuse  marijuana use  Endo/Other    Class 3 obesity (BMI 41)  Renal/GU negative Renal ROS  negative genitourinary   Musculoskeletal  (+) Arthritis ,    Abdominal   Peds  Hematology negative hematology ROS (+)   Anesthesia Other Findings   Reproductive/Obstetrics                             Anesthesia Physical Anesthesia Plan  ASA: 3  Anesthesia Plan: General   Post-op Pain Management: Tylenol PO (pre-op)*   Induction: Intravenous  PONV Risk Score and Plan: 3 and Ondansetron, Dexamethasone and Midazolam  Airway Management Planned: LMA  Additional Equipment:   Intra-op Plan:   Post-operative Plan: Extubation in OR  Informed Consent: I have reviewed the patients History and Physical, chart, labs and discussed the procedure including the risks, benefits and alternatives for the proposed anesthesia with the patient or authorized representative who has indicated his/her understanding and acceptance.     Dental advisory given  Plan Discussed with: CRNA  Anesthesia Plan Comments: (PAT note written 02/26/2024 by Shonna Chock, PA-C.  )       Anesthesia Quick Evaluation

## 2024-02-27 ENCOUNTER — Ambulatory Visit
Admission: RE | Admit: 2024-02-27 | Discharge: 2024-02-27 | Disposition: A | Discharge status: Home / Self Care | Source: Ambulatory Visit | Attending: Surgery | Admitting: Surgery

## 2024-02-27 ENCOUNTER — Other Ambulatory Visit: Payer: Self-pay | Admitting: Surgery

## 2024-02-27 DIAGNOSIS — R92322 Mammographic fibroglandular density, left breast: Secondary | ICD-10-CM | POA: Diagnosis not present

## 2024-02-27 DIAGNOSIS — N6320 Unspecified lump in the left breast, unspecified quadrant: Secondary | ICD-10-CM

## 2024-02-27 DIAGNOSIS — D242 Benign neoplasm of left breast: Secondary | ICD-10-CM | POA: Diagnosis not present

## 2024-02-27 HISTORY — PX: BREAST BIOPSY: SHX20

## 2024-02-28 ENCOUNTER — Encounter

## 2024-02-28 ENCOUNTER — Ambulatory Visit
Admission: RE | Admit: 2024-02-28 | Discharge: 2024-02-28 | Disposition: A | Discharge status: Home / Self Care | Source: Ambulatory Visit | Attending: Surgery | Admitting: Surgery

## 2024-02-28 ENCOUNTER — Other Ambulatory Visit: Payer: Self-pay

## 2024-02-28 ENCOUNTER — Ambulatory Visit (HOSPITAL_BASED_OUTPATIENT_CLINIC_OR_DEPARTMENT_OTHER): Admitting: Registered Nurse

## 2024-02-28 ENCOUNTER — Ambulatory Visit (HOSPITAL_COMMUNITY): Admitting: Vascular Surgery

## 2024-02-28 ENCOUNTER — Ambulatory Visit (HOSPITAL_COMMUNITY)
Admission: RE | Admit: 2024-02-28 | Discharge: 2024-02-28 | Disposition: A | Discharge status: Home / Self Care | Payer: 59 | Attending: Surgery | Admitting: Surgery

## 2024-02-28 ENCOUNTER — Encounter (HOSPITAL_COMMUNITY)
Admission: RE | Disposition: A | Discharge status: Home / Self Care | Payer: Self-pay | Source: Home / Self Care | Attending: Surgery

## 2024-02-28 ENCOUNTER — Encounter (HOSPITAL_COMMUNITY): Payer: Self-pay | Admitting: Surgery

## 2024-02-28 DIAGNOSIS — J45909 Unspecified asthma, uncomplicated: Secondary | ICD-10-CM | POA: Diagnosis not present

## 2024-02-28 DIAGNOSIS — Z1721 Progesterone receptor positive status: Secondary | ICD-10-CM | POA: Diagnosis not present

## 2024-02-28 DIAGNOSIS — N6320 Unspecified lump in the left breast, unspecified quadrant: Secondary | ICD-10-CM

## 2024-02-28 DIAGNOSIS — I110 Hypertensive heart disease with heart failure: Secondary | ICD-10-CM | POA: Diagnosis not present

## 2024-02-28 DIAGNOSIS — C50912 Malignant neoplasm of unspecified site of left female breast: Secondary | ICD-10-CM | POA: Diagnosis not present

## 2024-02-28 DIAGNOSIS — D242 Benign neoplasm of left breast: Secondary | ICD-10-CM | POA: Diagnosis not present

## 2024-02-28 DIAGNOSIS — Z1732 Human epidermal growth factor receptor 2 negative status: Secondary | ICD-10-CM | POA: Diagnosis not present

## 2024-02-28 DIAGNOSIS — D0512 Intraductal carcinoma in situ of left breast: Secondary | ICD-10-CM | POA: Insufficient documentation

## 2024-02-28 DIAGNOSIS — I509 Heart failure, unspecified: Secondary | ICD-10-CM | POA: Insufficient documentation

## 2024-02-28 DIAGNOSIS — E66813 Obesity, class 3: Secondary | ICD-10-CM | POA: Insufficient documentation

## 2024-02-28 DIAGNOSIS — Z170 Estrogen receptor positive status [ER+]: Secondary | ICD-10-CM | POA: Diagnosis not present

## 2024-02-28 DIAGNOSIS — I5021 Acute systolic (congestive) heart failure: Secondary | ICD-10-CM | POA: Diagnosis not present

## 2024-02-28 DIAGNOSIS — N6321 Unspecified lump in the left breast, upper outer quadrant: Secondary | ICD-10-CM

## 2024-02-28 DIAGNOSIS — Z6841 Body Mass Index (BMI) 40.0 and over, adult: Secondary | ICD-10-CM | POA: Insufficient documentation

## 2024-02-28 DIAGNOSIS — C50919 Malignant neoplasm of unspecified site of unspecified female breast: Secondary | ICD-10-CM

## 2024-02-28 HISTORY — PX: BREAST LUMPECTOMY WITH RADIOACTIVE SEED LOCALIZATION: SHX6424

## 2024-02-28 HISTORY — DX: Malignant neoplasm of unspecified site of unspecified female breast: C50.919

## 2024-02-28 SURGERY — BREAST LUMPECTOMY WITH RADIOACTIVE SEED LOCALIZATION
Anesthesia: General | Site: Breast | Laterality: Left

## 2024-02-28 MED ORDER — DEXAMETHASONE SODIUM PHOSPHATE 10 MG/ML IJ SOLN
INTRAMUSCULAR | Status: AC
  Filled 2024-02-28: qty 1

## 2024-02-28 MED ORDER — MIDAZOLAM HCL 2 MG/2ML IJ SOLN
INTRAMUSCULAR | Status: AC
  Filled 2024-02-28: qty 2

## 2024-02-28 MED ORDER — FENTANYL CITRATE (PF) 100 MCG/2ML IJ SOLN
25.0000 ug | INTRAMUSCULAR | Status: DC | PRN
Start: 2024-02-28 — End: 2024-02-28

## 2024-02-28 MED ORDER — OXYCODONE HCL 5 MG/5ML PO SOLN: 5.0000 mg | Freq: Once | ORAL | Status: DC | PRN

## 2024-02-28 MED ORDER — ROCURONIUM BROMIDE 10 MG/ML (PF) SYRINGE
PREFILLED_SYRINGE | INTRAVENOUS | Status: DC | PRN
  Administered 2024-02-28 (×2): 10 mg via INTRAVENOUS

## 2024-02-28 MED ORDER — OXYCODONE HCL 5 MG PO TABS: 5.0000 mg | ORAL_TABLET | Freq: Once | ORAL | Status: DC | PRN

## 2024-02-28 MED ORDER — OXYCODONE HCL 5 MG PO TABS: 5.0000 mg | ORAL_TABLET | Freq: Four times a day (QID) | ORAL | 0 refills | Status: DC | PRN

## 2024-02-28 MED ORDER — BUPIVACAINE-EPINEPHRINE (PF) 0.25% -1:200000 IJ SOLN
INTRAMUSCULAR | Status: AC
  Filled 2024-02-28: qty 30

## 2024-02-28 MED ORDER — DEXMEDETOMIDINE HCL IN NACL 80 MCG/20ML IV SOLN
INTRAVENOUS | Status: DC | PRN
  Administered 2024-02-28: 8 ug via INTRAVENOUS

## 2024-02-28 MED ORDER — ACETAMINOPHEN 500 MG PO TABS
1000.0000 mg | ORAL_TABLET | Freq: Once | ORAL | Status: AC
  Administered 2024-02-28: 1000 mg via ORAL
  Filled 2024-02-28: qty 2

## 2024-02-28 MED ORDER — SUGAMMADEX SODIUM 200 MG/2ML IV SOLN
INTRAVENOUS | Status: DC | PRN
  Administered 2024-02-28: 200 mg via INTRAVENOUS

## 2024-02-28 MED ORDER — EPHEDRINE SULFATE-NACL 50-0.9 MG/10ML-% IV SOSY
PREFILLED_SYRINGE | INTRAVENOUS | Status: DC | PRN
  Administered 2024-02-28: 15 mg via INTRAVENOUS

## 2024-02-28 MED ORDER — PHENYLEPHRINE HCL-NACL 20-0.9 MG/250ML-% IV SOLN
INTRAVENOUS | Status: DC | PRN
  Administered 2024-02-28: 40 ug/min via INTRAVENOUS

## 2024-02-28 MED ORDER — DEXMEDETOMIDINE HCL IN NACL 80 MCG/20ML IV SOLN
INTRAVENOUS | Status: AC
  Filled 2024-02-28: qty 20

## 2024-02-28 MED ORDER — ROCURONIUM BROMIDE 10 MG/ML (PF) SYRINGE
PREFILLED_SYRINGE | INTRAVENOUS | Status: AC
  Filled 2024-02-28: qty 10

## 2024-02-28 MED ORDER — AMISULPRIDE (ANTIEMETIC) 5 MG/2ML IV SOLN: 10.0000 mg | Freq: Once | INTRAVENOUS | Status: DC | PRN

## 2024-02-28 MED ORDER — SUCCINYLCHOLINE CHLORIDE 200 MG/10ML IV SOSY
PREFILLED_SYRINGE | INTRAVENOUS | Status: DC | PRN
  Administered 2024-02-28: 200 mg via INTRAVENOUS

## 2024-02-28 MED ORDER — ONDANSETRON HCL 4 MG/2ML IJ SOLN
INTRAMUSCULAR | Status: AC
Start: 2024-02-28 — End: ?
  Filled 2024-02-28: qty 2

## 2024-02-28 MED ORDER — PROPOFOL 10 MG/ML IV BOLUS
INTRAVENOUS | Status: AC
  Filled 2024-02-28: qty 20

## 2024-02-28 MED ORDER — GLYCOPYRROLATE PF 0.2 MG/ML IJ SOSY
PREFILLED_SYRINGE | INTRAMUSCULAR | Status: AC
  Filled 2024-02-28: qty 1

## 2024-02-28 MED ORDER — GLYCOPYRROLATE PF 0.2 MG/ML IJ SOSY
PREFILLED_SYRINGE | INTRAMUSCULAR | Status: DC | PRN
  Administered 2024-02-28: .2 mg via INTRAVENOUS

## 2024-02-28 MED ORDER — MIDAZOLAM HCL 2 MG/2ML IJ SOLN
INTRAMUSCULAR | Status: DC | PRN
  Administered 2024-02-28: 1 mg via INTRAVENOUS

## 2024-02-28 MED ORDER — ALBUTEROL SULFATE HFA 108 (90 BASE) MCG/ACT IN AERS
INHALATION_SPRAY | RESPIRATORY_TRACT | Status: DC | PRN
  Administered 2024-02-28: 6 via RESPIRATORY_TRACT

## 2024-02-28 MED ORDER — ORAL CARE MOUTH RINSE: 15.0000 mL | Freq: Once | OROMUCOSAL | Status: AC

## 2024-02-28 MED ORDER — LIDOCAINE 2% (20 MG/ML) 5 ML SYRINGE
INTRAMUSCULAR | Status: DC | PRN
  Administered 2024-02-28: 100 mg via INTRAVENOUS

## 2024-02-28 MED ORDER — BUPIVACAINE-EPINEPHRINE 0.25% -1:200000 IJ SOLN
INTRAMUSCULAR | Status: DC | PRN
  Administered 2024-02-28: 28 mL

## 2024-02-28 MED ORDER — ONDANSETRON HCL 4 MG/2ML IJ SOLN
INTRAMUSCULAR | Status: DC | PRN
  Administered 2024-02-28: 4 mg via INTRAVENOUS

## 2024-02-28 MED ORDER — CLINDAMYCIN PHOSPHATE 900 MG/50ML IV SOLN
900.0000 mg | INTRAVENOUS | Status: AC
  Administered 2024-02-28: 900 mg via INTRAVENOUS
  Filled 2024-02-28: qty 50

## 2024-02-28 MED ORDER — DEXAMETHASONE SODIUM PHOSPHATE 10 MG/ML IJ SOLN
INTRAMUSCULAR | Status: DC | PRN
  Administered 2024-02-28: 10 mg via INTRAVENOUS

## 2024-02-28 MED ORDER — ALBUTEROL SULFATE HFA 108 (90 BASE) MCG/ACT IN AERS
INHALATION_SPRAY | RESPIRATORY_TRACT | Status: AC
  Filled 2024-02-28: qty 6.7

## 2024-02-28 MED ORDER — PROPOFOL 10 MG/ML IV BOLUS
INTRAVENOUS | Status: DC | PRN
Start: 2024-02-28 — End: 2024-02-28
  Administered 2024-02-28 (×3): 100 mg via INTRAVENOUS

## 2024-02-28 MED ORDER — LIDOCAINE 2% (20 MG/ML) 5 ML SYRINGE
INTRAMUSCULAR | Status: AC
  Filled 2024-02-28: qty 5

## 2024-02-28 MED ORDER — PHENYLEPHRINE 80 MCG/ML (10ML) SYRINGE FOR IV PUSH (FOR BLOOD PRESSURE SUPPORT)
PREFILLED_SYRINGE | INTRAVENOUS | Status: AC
  Filled 2024-02-28: qty 10

## 2024-02-28 MED ORDER — CHLORHEXIDINE GLUCONATE 0.12 % MT SOLN
15.0000 mL | Freq: Once | OROMUCOSAL | Status: AC
  Administered 2024-02-28: 15 mL via OROMUCOSAL
  Filled 2024-02-28: qty 15

## 2024-02-28 MED ORDER — HYDROMORPHONE HCL 1 MG/ML IJ SOLN
INTRAMUSCULAR | Status: AC
  Filled 2024-02-28: qty 0.5

## 2024-02-28 MED ORDER — FENTANYL CITRATE (PF) 250 MCG/5ML IJ SOLN
INTRAMUSCULAR | Status: DC | PRN
  Administered 2024-02-28: 50 ug via INTRAVENOUS

## 2024-02-28 MED ORDER — PHENYLEPHRINE 80 MCG/ML (10ML) SYRINGE FOR IV PUSH (FOR BLOOD PRESSURE SUPPORT)
PREFILLED_SYRINGE | INTRAVENOUS | Status: DC | PRN
  Administered 2024-02-28: 160 ug via INTRAVENOUS
  Administered 2024-02-28: 80 ug via INTRAVENOUS
  Administered 2024-02-28: 160 ug via INTRAVENOUS

## 2024-02-28 MED ORDER — VASOPRESSIN 20 UNIT/ML IV SOLN
INTRAVENOUS | Status: AC
  Filled 2024-02-28: qty 1

## 2024-02-28 MED ORDER — FENTANYL CITRATE (PF) 250 MCG/5ML IJ SOLN
INTRAMUSCULAR | Status: AC
  Filled 2024-02-28: qty 5

## 2024-02-28 MED ORDER — CHLORHEXIDINE GLUCONATE CLOTH 2 % EX PADS: 6.0000 | MEDICATED_PAD | Freq: Once | CUTANEOUS | Status: DC

## 2024-02-28 MED ORDER — LACTATED RINGERS IV SOLN: INTRAVENOUS | Status: DC | PRN

## 2024-02-28 SURGICAL SUPPLY — 33 items
APPLIER CLIP 9.375 MED OPEN (MISCELLANEOUS) IMPLANT
BAG COUNTER SPONGE SURGICOUNT (BAG) ×1 IMPLANT
BINDER BREAST LRG (GAUZE/BANDAGES/DRESSINGS) IMPLANT
BINDER BREAST XLRG (GAUZE/BANDAGES/DRESSINGS) IMPLANT
CANISTER SUCT 3000ML PPV (MISCELLANEOUS) IMPLANT
CHLORAPREP W/TINT 26 (MISCELLANEOUS) ×1 IMPLANT
CLIP APPLIE 9.375 MED OPEN (MISCELLANEOUS) IMPLANT
COVER PROBE W GEL 5X96 (DRAPES) ×1 IMPLANT
COVER SURGICAL LIGHT HANDLE (MISCELLANEOUS) ×1 IMPLANT
DERMABOND ADVANCED .7 DNX12 (GAUZE/BANDAGES/DRESSINGS) ×1 IMPLANT
DEVICE DUBIN SPECIMEN MAMMOGRA (MISCELLANEOUS) ×1 IMPLANT
DRAPE CHEST BREAST 15X10 FENES (DRAPES) ×1 IMPLANT
ELECT CAUTERY BLADE 6.4 (BLADE) ×1 IMPLANT
ELECT REM PT RETURN 9FT ADLT (ELECTROSURGICAL) ×1 IMPLANT
ELECTRODE REM PT RTRN 9FT ADLT (ELECTROSURGICAL) ×1 IMPLANT
GAUZE PAD ABD 8X10 STRL (GAUZE/BANDAGES/DRESSINGS) ×1 IMPLANT
GLOVE BIO SURGEON STRL SZ8 (GLOVE) ×1 IMPLANT
GLOVE BIOGEL PI IND STRL 8 (GLOVE) ×1 IMPLANT
GOWN STRL REUS W/ TWL LRG LVL3 (GOWN DISPOSABLE) ×1 IMPLANT
GOWN STRL REUS W/ TWL XL LVL3 (GOWN DISPOSABLE) ×1 IMPLANT
KIT BASIN OR (CUSTOM PROCEDURE TRAY) ×1 IMPLANT
KIT MARKER MARGIN INK (KITS) ×1 IMPLANT
LIGHT WAVEGUIDE WIDE FLAT (MISCELLANEOUS) IMPLANT
NDL HYPO 25GX1X1/2 BEV (NEEDLE) ×1 IMPLANT
NEEDLE HYPO 25GX1X1/2 BEV (NEEDLE) ×1 IMPLANT
NS IRRIG 1000ML POUR BTL (IV SOLUTION) IMPLANT
PACK GENERAL/GYN (CUSTOM PROCEDURE TRAY) ×1 IMPLANT
SUT MNCRL AB 4-0 PS2 18 (SUTURE) ×1 IMPLANT
SUT SILK 2 0 SH (SUTURE) IMPLANT
SUT VIC AB 2-0 SH 27XBRD (SUTURE) IMPLANT
SUT VIC AB 3-0 SH 8-18 (SUTURE) ×1 IMPLANT
SYR CONTROL 10ML LL (SYRINGE) ×1 IMPLANT
TOWEL GREEN STERILE FF (TOWEL DISPOSABLE) IMPLANT

## 2024-02-28 NOTE — H&P (Signed)
 History of Present Illness: Janet [name] is a 58 y.o. female who is seen today as an office consultation for evaluation of New Consultation ( Left intraductal papilloma)  Patient presents for evaluation of abnormal left breast mammogram. She had 3 areas in her left breast. She was having pain and a painful knot. Core biopsy showed a papilloma with the other 2 areas showing fibrocystic type changes. No family history of breast cancer. This was her first breast biopsy on the left side.  Review of Systems: A complete review of systems was obtained from the patient. I have reviewed this information and discussed as appropriate with the patient. See HPI as well for other ROS.    Medical History: Past Medical History:  Diagnosis Date  Anxiety  Arthritis  Asthma, unspecified asthma severity, unspecified whether complicated, unspecified whether persistent (HHS-HCC)  Hypertension   There is no problem list on file for this patient.  Past Surgical History:  Procedure Laterality Date  HERNIA REPAIR  HYSTERECTOMY  LAPAROSCOPIC TUBAL LIGATION    Allergies  Allergen Reactions  Biaxin [Clarithromycin] Other (See Comments)  Upset stomach, Cramps  Penicillin Rash   Current Outpatient Medications on File Prior to Visit  Medication Sig Dispense Refill  albuterol (PROVENTIL) 5 mg/mL nebulizer solution Inhale 2.5 mg into the lungs  albuterol MDI, PROVENTIL, VENTOLIN, PROAIR, HFA 90 mcg/actuation inhaler INHALE 1 TO 2 PUFFS INTO THE LUNGS EVERY 6 HOURS AS NEEDED FOR WHEEZING OR SHORTNESS OF BREATH  ARIPiprazole (ABILIFY) 10 MG tablet Take 1 tablet by mouth at bedtime  aspirin 81 MG chewable tablet Chew 1 tablet every day by oral route for 90 days.  budesonide-glycopyrrolate-formoterol (BREZTRI AEROSPHERE) 160-9-4.8 mcg/actuation inhaler Inhale 2 Inhalations into the lungs 2 (two) times daily  carvediloL (COREG) 12.5 MG tablet Take 12.5 mg by mouth 2 (two) times daily  dapagliflozin  propanediol (FARXIGA) 10 mg tablet Take 1 tablet by mouth once daily  ENTRESTO 49-51 mg tablet TAKE ONE TABLET BY MOUTH TWICE DAILY @ 9AM-5PM  ergocalciferol, vitamin D2, 1,250 mcg (50,000 unit) capsule TAKE 1 CAPSULE BY MOUTH EVERY 7 DAYS FOR 12 DOSES  FLONASE ALLERGY RELIEF 50 mcg/actuation nasal spray Spray 1 spray every day by intranasal route.  FUROsemide (LASIX) 20 MG tablet Take 1 tablet by mouth once daily  hydrOXYzine (VISTARIL) 50 MG capsule Take 50 mg by mouth 2 (two) times daily  montelukast (SINGULAIR) 10 mg tablet Take 1 tablet by mouth at bedtime  spironolactone (ALDACTONE) 25 MG tablet Take 1 tablet by mouth once daily  traZODone (DESYREL) 150 MG tablet Take 1 tablet by mouth at bedtime   No current facility-administered medications on file prior to visit.   Family History  Problem Relation Age of Onset  Obesity Mother  High blood pressure (Hypertension) Mother  Skin cancer Mother  Obesity Sister  High blood pressure (Hypertension) Sister  Obesity Brother  High blood pressure (Hypertension) Brother  Diabetes Brother    Social History   Tobacco Use  Smoking Status Never  Smokeless Tobacco Never    Social History   Socioeconomic History  Marital status: Single  Tobacco Use  Smoking status: Never  Smokeless tobacco: Never  Vaping Use  Vaping status: Unknown  Substance and Sexual Activity  Alcohol use: Yes  Drug use: Yes  Comment: THC   Social Drivers of Corporate investment banker Strain: Low Risk (09/03/2023)  Received from [facility]  Overall Financial Resource Strain (CARDIA)  Difficulty of Paying Living Expenses: Not hard at all  Food Insecurity: No Food Insecurity (09/03/2023)  Received from [facility]  Hunger Vital Sign  Worried About Running Out of Food in the Last Year: Never true  Ran Out of Food in the Last Year: Never true  Transportation Needs: No Transportation Needs (09/03/2023)  Received from [facility] - Transportation   Lack of Transportation (Medical): No  Lack of Transportation (Non-Medical): No  Physical Activity: Unknown (09/03/2023)  Received from [facility]  Exercise Vital Sign  Days of Exercise per Week: 0 days  Stress: Stress Concern Present (09/03/2023)  Received from [facility] - Occupational Stress Questionnaire  Feeling of Stress : To some extent  Social Connections: Socially Isolated (09/03/2023)  Received from [facility]  Social Connection and Isolation Panel [NHANES]  Frequency of Communication with Friends and Family: More than three times a week  Frequency of Social Gatherings with Friends and Family: Once a week  Attends Religious Services: Never  Database administrator or Organizations: No  Marital Status: Never married   Objective:   Vitals:  11/12/23 0845  BP: 113/82  Pulse: 69  Temp: 36.2 C (97.2 F)  SpO2: 96%  Weight: (!) 121.9 kg (268 lb 12.8 oz)  Height: 172.7 cm (5\' 8" )  PainSc: 3   Body mass index is 40.87 kg/m.  Physical Exam Exam conducted with a chaperone present.  Cardiovascular:  Rate and Rhythm: Normal rate.  Pulmonary:  Effort: Pulmonary effort is normal.  Chest:  Breasts: Right: Normal.   Comments: Central fibrocystic changes. No bruising.left  Musculoskeletal:  General: Normal range of motion.  Lymphadenopathy:  Upper Body:  Right upper body: No axillary adenopathy.  Left upper body: No axillary adenopathy.  Neurological:  General: No focal deficit present.  Mental Status: She is alert.  Psychiatric:  Mood and Affect: Mood normal.     Labs, Imaging and Diagnostic Testing:  FINAL DIAGNOSIS  1. Breast, left, needle core biopsy, 6 o'clock mass, ribbon IMC, DCIS, papilloma : HIGHLY FRAGMENTED INTRADUCTAL PAPILLOMA WITH USUAL DUCTAL HYPERPLASIA. NEGATIVE FOR ATYPIA OR MALIGNANCY.  2. Breast, left, needle core biopsy, 8 o'clock, coil, DCIS, papilloma : FIBROADENOMATOID CHANGES (6  MM). NEGATIVE FOR ATYPIA OR MALIGNANCY.  3. Breast, left, needle core biopsy, 10 o'clock, heart, papilloma, DCIS, FCC : BENIGN BREAST TISSUE WITH FIBROCYSTIC CHANGES INCLUDING STROMAL FIBROSIS, CYSTS, USUAL DUCTAL HYPERPLASIA. NEGATIVE FOR ATYPIA OR MALIGNANCY.  Assessment and Plan:   Diagnoses and all orders for this visit:  Mass of upper outer quadrant of left breast   Discussed papilloma pathophysiology today. She is also having pain in this area which is probably from fibrocystic change. Pros and cons of papilloma removal discussed as well as potential upgrade risk of 5%. She is opted to proceed with left breast seed lumpectomy. A bracketed approach can be utilized utilizing 2 or 3 seeds.The procedure has been discussed with the patient. Alternatives to surgery have been discussed with the patient. Risks of surgery include bleeding, Infection, Seroma formation, death, and the need for further surgery. The patient understands and wishes to proceed.    Hayden Rasmussen, MD

## 2024-02-28 NOTE — Discharge Instructions (Signed)

## 2024-02-28 NOTE — Interval H&P Note (Signed)
 History and Physical Interval Note:  02/28/2024 2:20 PM  Janet [name]  has presented today for surgery, with the diagnosis of LEFT PAPILLOMA, MASS.  The various methods of treatment have been discussed with the patient and family. After consideration of risks, benefits and other options for treatment, the patient has consented to  Procedure(s) with comments: LEFT BREAST SEED LUMPECTOMY (Left) - 3 seeds as a surgical intervention.  The patient's history has been reviewed, patient examined, no change in status, stable for surgery.  I have reviewed the patient's chart and labs.  Questions were answered to the patient's satisfaction.    The procedure has been discussed with the patient. Alternatives to surgery have been discussed with the patient.  Risks of surgery include bleeding,  Infection,  Seroma formation, death,  and the need for further surgery.   The patient understands and wishes to proceed.  [name] A Creola Krotz

## 2024-02-28 NOTE — Transfer of Care (Addendum)
 Immediate Anesthesia Transfer of Care Note  Patient: Janet [name]  Procedure(s) Performed: LEFT BREAST SEED LUMPECTOMY (Left: Breast)  Patient Location: PACU  Anesthesia Type:General  Level of Consciousness: awake and alert   Airway & Oxygen Therapy: Patient Spontanous Breathing and Patient connected to face mask oxygen  Post-op Assessment: Report given to RN, Post -op Vital signs reviewed and stable, and Patient moving all extremities X 4  Post vital signs: Reviewed and stable  Last Vitals:  Vitals Value Taken Time  BP 130/81 02/28/24 1540  Temp    Pulse 87 02/28/24 1543  Resp 15 02/28/24 1543  SpO2 100 % 02/28/24 1543  Vitals shown include unfiled device data.  Last Pain:  Vitals:   02/28/24 1257  TempSrc:   PainSc: 0-No pain         Complications: There were no known notable events for this encounter.

## 2024-02-28 NOTE — Anesthesia Postprocedure Evaluation (Signed)
 Anesthesia Post Note  Patient: Janet [name]  Procedure(s) Performed: LEFT BREAST SEED LUMPECTOMY (Left: Breast)     Anesthesia Type: General Anesthetic complications: no  There were no known notable events for this encounter.  Last Vitals:  Vitals:   02/28/24 1233  BP: 125/85  Pulse: 66  Resp: 18  Temp: 36.5 C  SpO2: 97%    Last Pain:  Vitals:   02/28/24 1257  TempSrc:   PainSc: 0-No pain                 Ronda Fairly Gregg Holster

## 2024-02-28 NOTE — Op Note (Signed)
 Preoperative diagnosis: Left breast mass overlapping quadrants  Postoperative diagnosis: Same  Procedure: Left breast seed localized lumpectomy with bracketed approach  Surgeon: Harriette Bouillon, MD  Anesthesia: LMA with 0.25% Marcaine with epinephrine  EBL: 40 cc  Drains: None  Specimen: Left breast tissue with 2 seeds and 1 clip verified by Faxitron.  Indications for procedure: The patient is a 58 year old female had a core biopsy in November 2024.  She had a large mass in her lower central breast.  Core biopsy showed papillary fragments and fibroadenoma type changes.  This was felt to be discordant.  She presents today for left breast lumpectomy for excision of this mass.The procedure has been discussed with the patient. Alternatives to surgery have been discussed with the patient.  Risks of surgery include bleeding,  Infection,  Seroma formation, death,  and the need for further surgery.   The patient understands and wishes to proceed.    Description of procedure: The patient was seen in the holding area and questions answered.  Of note she had 2 seeds placed as an outpatient in her left lower breast.  This was marked as correct site.  She was taken back to the operative room.  She was placed supine upon the OR table.  After induction of general anesthesia, left breast was prepped and draped in sterile fashion timeout performed.  Proper patient, site and procedure verified.  Neoprobe was used to identify both seeds in the lower central breast.  A curvilinear incision was made along the inferior border of the nipple areolar complex.  Dissection was carried down all tissue and both seeds and single clip were excised with a grossly negative margin.  This was count of a large fragmented papillomata's type mass.  It was quite large there with a hematoma.  This was sent to pathology.  This was oriented with ink as well prior to sending to pathology.  The cavities irrigated.  Local anesthetic  infiltrated.  Deep tissue planes approximated 3-0 Vicryl.  4 Monocryl used to close the skin in a subcuticular fashion.  Dermabond applied.  Breast binder placed.  All counts found to be correct.  Patient was awoke extubated taken to recovery in satisfactory condition.

## 2024-02-28 NOTE — Anesthesia Procedure Notes (Addendum)
 Procedure Name: Intubation Date/Time: 02/28/2024 2:42 PM  Performed by: Sharyn Dross, CRNAPre-anesthesia Checklist: Patient identified, Emergency Drugs available, Suction available and Patient being monitored Patient Re-evaluated:Patient Re-evaluated prior to induction Oxygen Delivery Method: Circle system utilized Preoxygenation: Pre-oxygenation with 100% oxygen Induction Type: IV induction Ventilation: Mask ventilation without difficulty and Oral airway inserted - appropriate to patient size Laryngoscope Size: Mac and 4 Grade View: Grade II Tube type: Oral Tube size: 7.0 mm Number of attempts: 1 Airway Equipment and Method: Stylet and Oral airway Placement Confirmation: ETT inserted through vocal cords under direct vision, positive ETCO2 and breath sounds checked- equal and bilateral Secured at: 23 cm Tube secured with: Tape Dental Injury: Teeth and Oropharynx as per pre-operative assessment

## 2024-02-29 ENCOUNTER — Encounter (HOSPITAL_COMMUNITY): Payer: Self-pay | Admitting: Surgery

## 2024-03-05 LAB — SURGICAL PATHOLOGY

## 2024-03-05 NOTE — Progress Notes (Signed)
 noted

## 2024-03-10 ENCOUNTER — Encounter: Payer: Self-pay | Admitting: *Deleted

## 2024-03-10 ENCOUNTER — Ambulatory Visit: Payer: Self-pay | Admitting: Surgery

## 2024-03-11 ENCOUNTER — Encounter: Payer: Self-pay | Admitting: *Deleted

## 2024-03-11 ENCOUNTER — Encounter (HOSPITAL_COMMUNITY): Payer: Self-pay

## 2024-03-11 ENCOUNTER — Ambulatory Visit (HOSPITAL_COMMUNITY)
Admission: EM | Admit: 2024-03-11 | Discharge: 2024-03-11 | Disposition: A | Discharge status: Home / Self Care | Attending: Internal Medicine | Admitting: Internal Medicine

## 2024-03-11 ENCOUNTER — Inpatient Hospital Stay: Attending: Hematology and Oncology | Admitting: Hematology and Oncology

## 2024-03-11 ENCOUNTER — Inpatient Hospital Stay

## 2024-03-11 VITALS — BP 114/71 | HR 79 | Temp 97.6°F | Resp 17 | Wt 271.8 lb

## 2024-03-11 DIAGNOSIS — C50212 Malignant neoplasm of upper-inner quadrant of left female breast: Secondary | ICD-10-CM | POA: Insufficient documentation

## 2024-03-11 DIAGNOSIS — J340 Abscess, furuncle and carbuncle of nose: Secondary | ICD-10-CM | POA: Diagnosis not present

## 2024-03-11 DIAGNOSIS — F1290 Cannabis use, unspecified, uncomplicated: Secondary | ICD-10-CM | POA: Insufficient documentation

## 2024-03-11 DIAGNOSIS — Z170 Estrogen receptor positive status [ER+]: Secondary | ICD-10-CM | POA: Insufficient documentation

## 2024-03-11 DIAGNOSIS — K047 Periapical abscess without sinus: Secondary | ICD-10-CM | POA: Diagnosis not present

## 2024-03-11 LAB — CMP (CANCER CENTER ONLY)
ALT: 10 U/L (ref 0–44)
AST: 12 U/L — ABNORMAL LOW (ref 15–41)
Albumin: 3.9 g/dL (ref 3.5–5.0)
Alkaline Phosphatase: 68 U/L (ref 38–126)
Anion gap: 4 — ABNORMAL LOW (ref 5–15)
BUN: 14 mg/dL (ref 6–20)
CO2: 30 mmol/L (ref 22–32)
Calcium: 8.8 mg/dL — ABNORMAL LOW (ref 8.9–10.3)
Chloride: 108 mmol/L (ref 98–111)
Creatinine: 1.15 mg/dL — ABNORMAL HIGH (ref 0.44–1.00)
GFR, Estimated: 56 mL/min — ABNORMAL LOW (ref >60)
Glucose, Bld: 96 mg/dL (ref 70–99)
Potassium: 4.3 mmol/L (ref 3.5–5.1)
Sodium: 142 mmol/L (ref 135–145)
Total Bilirubin: 0.5 mg/dL (ref 0.0–1.2)
Total Protein: 6.6 g/dL (ref 6.5–8.1)

## 2024-03-11 LAB — CBC WITH DIFFERENTIAL/PLATELET
Abs Immature Granulocytes: 0.01 10*3/uL (ref 0.00–0.07)
Basophils Absolute: 0 10*3/uL (ref 0.0–0.1)
Basophils Relative: 1 %
Eosinophils Absolute: 0.4 10*3/uL (ref 0.0–0.5)
Eosinophils Relative: 5 %
HCT: 42 % (ref 36.0–46.0)
Hemoglobin: 13.7 g/dL (ref 12.0–15.0)
Immature Granulocytes: 0 %
Lymphocytes Relative: 32 %
Lymphs Abs: 2.7 10*3/uL (ref 0.7–4.0)
MCH: 27.2 pg (ref 26.0–34.0)
MCHC: 32.6 g/dL (ref 30.0–36.0)
MCV: 83.3 fL (ref 80.0–100.0)
Monocytes Absolute: 0.5 10*3/uL (ref 0.1–1.0)
Monocytes Relative: 6 %
Neutro Abs: 4.9 10*3/uL (ref 1.7–7.7)
Neutrophils Relative %: 56 %
Platelets: 218 10*3/uL (ref 150–400)
RBC: 5.04 MIL/uL (ref 3.87–5.11)
RDW: 15.7 % — ABNORMAL HIGH (ref 11.5–15.5)
WBC: 8.6 10*3/uL (ref 4.0–10.5)
nRBC: 0 % (ref 0.0–0.2)

## 2024-03-11 MED ORDER — CLINDAMYCIN HCL 300 MG PO CAPS: 300.0000 mg | ORAL_CAPSULE | Freq: Three times a day (TID) | ORAL | 0 refills | Status: AC

## 2024-03-11 NOTE — Progress Notes (Signed)
 [facility] CONSULT NOTE  Patient Care Team: Senaida Dama, NP as PCP - General (Nurse Practitioner) Nahser, Lela Purple, MD as PCP - Cardiology (Cardiology) Nahser, Lela Purple, MD as Consulting Physician (Cardiology) Auther Bo, RN as Oncology Nurse Navigator Alane Hsu, RN as Oncology Nurse Navigator Murleen Arms, MD as Consulting Physician (Hematology and Oncology)  CHIEF COMPLAINTS/PURPOSE OF CONSULTATION:  Newly diagnosed breast cancer  HISTORY OF PRESENTING ILLNESS:  Janet [name] 58 y.o. female is here because of recent diagnosis of left breast cancer  I reviewed her records extensively and collaborated the history with the patient.  SUMMARY OF ONCOLOGIC HISTORY: Oncology History  Malignant neoplasm of upper-inner quadrant of left breast in female, estrogen receptor positive (HCC)  10/11/2023 Mammogram   There is a dominant solid mass at the palpable site of concern in the left breast at 6 o'clock measuring 4.2 cm. There are multiple other smaller solid-appearing masses in the left breast at 8 o'clock, 9 o'clock and 10 o'clock. No evidence of left axillary lymphadenopathy. No evidence of malignancy in the right breast.   03/11/2024 Initial Diagnosis   Malignant neoplasm of upper-inner quadrant of left breast in female, estrogen receptor positive (HCC)   03/11/2024 Cancer Staging   Staging form: Breast, AJCC 8th Edition - Pathologic: Stage Unknown (pT3, pNX, cM0, G2, ER+, PR+, HER2-) - Signed by Murleen Arms, MD on 03/11/2024 Histologic grading system: 3 grade system   03/13/2024 Pathology Results   Left lumpectomy  Papillary carcinoma with invasion, 5.1 cm, grade 2  Ductal carcinoma in situ: Cribriform, nuclear grade 2  Margins, invasive: Positive      Closest, invasive: Posterior and medial  Margins, DCIS: Negative      Closest, DCIS: 1 mm, superior and anterior  Lymphovascular invasion: Not identified   The tumor cells are NEGATIVE  for Her2 (1+).   Estrogen Receptor:       95%, POSTIVE, STRONG STAINING INTENSITY  Progesterone Receptor:   10%, POSITIVE, MODERATE STAINING INTENSITY  Proliferation Marker Ki-67:   20%     Patient came by herself to the appointment. Ms. Petruska was quite apprehensive at the time of visit today.  Fortunately when she had her first biopsy back in November she was thought to have an intraductal papilloma however her most recent lumpectomy showed a 5.1 cm papillary carcinoma, positive medial and posterior margin, ER +95% strong staining PR +10% moderate staining HER2 1+ with an intermediate proliferation rate of 20%, grade 2.  She is understandably shocked at this diagnosis and is worried about her prognosis.  At baseline she has congestive heart failure with an ejection fraction of 30 to 35%, last echo was done in January 2024.  She also has nonischemic cardiomyopathy.  She had history of drug abuse including crack cocaine, last use was about 6 months ago, drinks about a pint of alcohol every week and used to marijuana.  She is currently on disability, has reasonable social support.  She also has severe anxiety and asthma which is not very well-controlled.  Rest of the pertinent 10 point ROS reviewed and neg.   MEDICAL HISTORY:  Past Medical History:  Diagnosis Date   Allergy    Anxiety    Arthritis    Asthma    CHF (congestive heart failure) (HCC)    Congestive heart failure (CHF) (HCC)    Depression    Elevated brain natriuretic peptide (BNP) level    Hypertension    Lymphedema  Nonischemic cardiomyopathy (HCC) 03/2021   SOB (shortness of breath)    Substance abuse (HCC)     SURGICAL HISTORY: Past Surgical History:  Procedure Laterality Date   ABDOMINAL HYSTERECTOMY     BREAST BIOPSY Right    BREAST BIOPSY Left 10/16/2023   times 3   BREAST BIOPSY Left 10/16/2023   US  LT BREAST BX W LOC DEV EA ADD LESION IMG BX SPEC US  GUIDE 10/16/2023 [facility]   BREAST BIOPSY Left  10/16/2023   US  LT BREAST BX W LOC DEV 1ST LESION IMG BX SPEC US  GUIDE 10/16/2023 [facility]   BREAST BIOPSY Left 10/16/2023   US  LT BREAST BX W LOC DEV EA ADD LESION IMG BX SPEC US  GUIDE 10/16/2023 [facility]   BREAST BIOPSY Left 02/27/2024   US  LT RADIOACTIVE SEED LOC 02/27/2024 [facility]   BREAST BIOPSY Left 02/27/2024   US  LT RADIOACTIVE SEED EA ADD LESION 02/27/2024 [facility]   BREAST LUMPECTOMY WITH RADIOACTIVE SEED LOCALIZATION Left 02/28/2024   Procedure: LEFT BREAST SEED LUMPECTOMY;  Surgeon: Sim Dryer, MD;  Location: [facility] OR;  Service: General;  Laterality: Left;  3 seeds   CESAREAN SECTION     COLONOSCOPY WITH PROPOFOL  N/A 12/10/2023   Procedure: COLONOSCOPY WITH PROPOFOL ;  Surgeon: Ace Holder, MD;  Location: [facility];  Service: Gastroenterology;  Laterality: N/A;   HEMOSTASIS CLIP PLACEMENT  12/10/2023   Procedure: HEMOSTASIS CLIP PLACEMENT;  Surgeon: Ace Holder, MD;  Location: [facility];  Service: Gastroenterology;;   HERNIA REPAIR     umbilical as a child   POLYPECTOMY  12/10/2023   Procedure: POLYPECTOMY;  Surgeon: Ace Holder, MD;  Location: [facility];  Service: Gastroenterology;;   RIGHT/LEFT HEART CATH AND CORONARY ANGIOGRAPHY N/A 04/01/2021   Procedure: RIGHT/LEFT HEART CATH AND CORONARY ANGIOGRAPHY;  Surgeon: Lucendia Rusk, MD;  Location: [facility];  Service: Cardiovascular;  Laterality: N/A;   TUBAL LIGATION      SOCIAL HISTORY: Social History   Socioeconomic History   Marital status: Single    Spouse name: Not on file   Number of children: 2   Years of education: 14   Highest education level: Associate degree: academic program  Occupational History   Occupation: Consulting civil engineer   Occupation: disabled  Tobacco Use   Smoking status: Never    Passive exposure: Current   Smokeless tobacco: Never  Vaping Use   Vaping status: Never Used  Substance and Sexual Activity   Alcohol use: Yes     Alcohol/week: 3.0 standard drinks of alcohol    Types: 3 Shots of liquor per week    Comment: 1 pint per week   Drug use: Yes    Frequency: 1.0 times per week    Types: Marijuana    Comment: uses marijuana twice a week   Sexual activity: Not Currently    Birth control/protection: Surgical  Other Topics Concern   Not on file  Social History Narrative   Not on file   Social Drivers of Health   Financial Resource Strain: Low Risk  (01/03/2024)   Overall Financial Resource Strain (CARDIA)    Difficulty of Paying Living Expenses: Not very hard  Food Insecurity: Food Insecurity Present (01/03/2024)   Hunger Vital Sign    Worried About Running Out of Food in the Last Year: Never true    Ran Out of Food in the Last Year: Sometimes true  Transportation Needs: No Transportation Needs (01/03/2024)   PRAPARE - Transportation  Lack of Transportation (Medical): No    Lack of Transportation (Non-Medical): No  Physical Activity: Inactive (01/03/2024)   Exercise Vital Sign    Days of Exercise per Week: 0 days    Minutes of Exercise per Session: 0 min  Stress: Stress Concern Present (01/03/2024)   [employer] of Occupational Health - Occupational Stress Questionnaire    Feeling of Stress : Very much  Social Connections: Socially Isolated (01/03/2024)   Social Connection and Isolation Panel [NHANES]    Frequency of Communication with Friends and Family: More than three times a week    Frequency of Social Gatherings with Friends and Family: Once a week    Attends Religious Services: Never    Database administrator or Organizations: No    Attends Engineer, structural: Not on file    Marital Status: Never married  Intimate Partner Violence: Not At Risk (01/03/2024)   Humiliation, Afraid, Rape, and Kick questionnaire    Fear of Current or Ex-Partner: No    Emotionally Abused: No    Physically Abused: No    Sexually Abused: No    FAMILY HISTORY: Family History  Problem  Relation Age of Onset   Colon polyps Mother    Hypertension Mother    Stroke Mother    Deep vein thrombosis Mother    Congestive Heart Failure Mother    Arthritis Mother    Asthma Mother    Depression Mother    Other Father        history unknown   Obesity Sister    Diabetes Sister    Obesity Brother    Congestive Heart Failure Maternal Grandmother    Colon cancer Neg Hx    Esophageal cancer Neg Hx    Rectal cancer Neg Hx    Stomach cancer Neg Hx     ALLERGIES:  is allergic to clarithromycin, penicillins, and penicillin g.  MEDICATIONS:  Current Outpatient Medications  Medication Sig Dispense Refill   albuterol  (PROVENTIL ) (2.5 MG/3ML) 0.083% nebulizer solution Take 3 mLs (2.5 mg total) by nebulization every 4 (four) hours as needed for wheezing or shortness of breath. 75 mL 2   ARIPiprazole  (ABILIFY ) 10 MG tablet Take 10 mg by mouth daily.     aspirin  81 MG chewable tablet Chew 81 mg by mouth daily.     azithromycin  (ZITHROMAX ) 250 MG tablet Take 2 tabs PO day 1, then take 1 tab PO once daily 6 tablet 0   benzonatate  (TESSALON ) 100 MG capsule Take 1-2 caps PO TID PRN (Patient not taking: Reported on 12/11/2023) 20 capsule 0   Budeson-Glycopyrrol-Formoterol  (BREZTRI  AEROSPHERE) 160-9-4.8 MCG/ACT AERO Inhale 2 puffs into the lungs in the morning and at bedtime. 10.7 g 5   carvedilol  (COREG ) 12.5 MG tablet Take 1 tablet (12.5 mg total) by mouth 2 (two) times daily with a meal. 60 tablet 11   dextromethorphan-guaiFENesin  (MUCINEX  DM) 30-600 MG 12hr tablet Take 1 tablet by mouth 2 (two) times daily as needed for cough.     doxycycline  (VIBRAMYCIN ) 100 MG capsule Take 1 capsule (100 mg total) by mouth 2 (two) times daily. 20 capsule 0   fluticasone  (FLONASE ) 50 MCG/ACT nasal spray Place 2 sprays into both nostrils daily. (Patient taking differently: Place 2 sprays into both nostrils daily as needed for allergies or rhinitis.) 48 g 0   furosemide  (LASIX ) 20 MG tablet TAKE ONE TABLET (20  MG) BY MOUTH DAILY AT 9AM (Patient taking differently: Take 20 mg by mouth daily  as needed for fluid or edema.) 30 tablet 11   hydrOXYzine  (VISTARIL ) 50 MG capsule Take 100 mg by mouth at bedtime.     montelukast  (SINGULAIR ) 10 MG tablet TAKE 1 TABLET(10 MG) BY MOUTH AT BEDTIME 90 tablet 3   Na Sulfate-K Sulfate-Mg Sulfate concentrate 17.5-3.13-1.6 GM/177ML SOLN SMARTSIG:1 Kit(s) By Mouth Once (Patient not taking: Reported on 12/11/2023)     oxyCODONE  (OXY IR/ROXICODONE ) 5 MG immediate release tablet Take 1 tablet (5 mg total) by mouth every 6 (six) hours as needed for severe pain (pain score 7-10). 15 tablet 0   sacubitril -valsartan  (ENTRESTO ) 49-51 MG TAKE ONE TABLET BY MOUTH TWICE DAILY @ 9AM-5PM 180 tablet 1   spironolactone  (ALDACTONE ) 25 MG tablet TAKE 1 TABLET(25 MG) BY MOUTH DAILY 90 tablet 3   traZODone  (DESYREL ) 150 MG tablet Take 150 mg by mouth at bedtime.     No current facility-administered medications for this visit.    REVIEW OF SYSTEMS:   Constitutional: Denies fevers, chills or abnormal night sweats Eyes: Denies blurriness of vision, double vision or watery eyes Ears, nose, mouth, throat, and face: Denies mucositis or sore throat Respiratory: Denies cough, dyspnea or wheezes Cardiovascular: Denies palpitation, chest discomfort or lower extremity swelling Gastrointestinal:  Denies nausea, heartburn or change in bowel habits Skin: Denies abnormal skin rashes Lymphatics: Denies new lymphadenopathy or easy bruising Neurological:Denies numbness, tingling or new weaknesses Behavioral/Psych: Mood is stable, no new changes  Breast: Denies any palpable lumps or discharge All other systems were reviewed with the patient and are negative.  PHYSICAL EXAMINATION: ECOG PERFORMANCE STATUS: 0 - Asymptomatic  Vitals:   03/11/24 1408  BP: 114/71  Pulse: 79  Resp: 17  Temp: 97.6 F (36.4 C)  SpO2: 96%   Filed Weights   03/11/24 1408  Weight: 271 lb 12.8 oz (123.3 kg)     GENERAL:alert, no distress and comfortable Left breast periareolar scar healing well.  LABORATORY DATA:  I have reviewed the data as listed Lab Results  Component Value Date   WBC 6.1 02/25/2024   HGB 14.5 02/25/2024   HCT 45.5 02/25/2024   MCV 84.4 02/25/2024   PLT 259 02/25/2024   Lab Results  Component Value Date   NA 141 02/25/2024   K 4.1 02/25/2024   CL 107 02/25/2024   CO2 26 02/25/2024    RADIOGRAPHIC STUDIES: I have personally reviewed the radiological reports and agreed with the findings in the report.  ASSESSMENT AND PLAN:  Malignant neoplasm of upper-inner quadrant of left breast in female, estrogen receptor positive (HCC) This is a very pleasant 58 yr old female patient with ER positive, PR positive, Her 2 neg T2Nx papillary carcinoma of the breast referred to medical oncology. She original had mammogram and biopsy November which showed an intraductal papilloma.  Surgery was postponed because of poorly controlled asthma.  She most recently had a left breast lumpectomy, final pathology showed papillary carcinoma measuring 5.1 cm, positive margin, lymph node specimen was submitted.  She is going to be rescheduled for another surgery for margin clearance as well as for sentinel lymph node biopsy.  She arrived to the appointment today by herself.  At this time given her baseline congestive heart failure and low ejection fraction and her other multiple comorbidities, we have discussed about proceeding with the second surgery.  We have briefly talked about Oncotype although I do believe that she will be a great chemotherapy candidate.  If she has a much larger tumor than 5.1 cm or  multiple positive lymph nodes, then we will coordinate with her cardiologist to see how we can optimize her cardiac health and see if she will be a candidate for chemotherapy.  If she does not have any evidence of lymph node involvement, then we may opt for adjuvant radiation followed by antiestrogen  therapy for 5 to 10 years without any therapy.  I have also ordered another echocardiogram since it has been more than a year from her last echo to understand her baseline cardiac status.  I have reassured her to my best ability.  At this time staging is not complete however I will be able to complete her staging during her next visit.  All her questions were answered to the best my knowledge. We plan to draw estradiol , LH and FSH today to her menopausal status.  She understands the role of antiestrogen therapy to reduce the risk of breast cancer recurrence.  Thank you for consulting us  in the care of this patient.  Please do not hesitate to contact us  with any additional questions or concerns.  Time spent: 45 min All questions were answered. The patient knows to call the clinic with any problems, questions or concerns.    Murleen Arms, MD 03/11/24

## 2024-03-11 NOTE — ED Provider Notes (Signed)
 [facility]    CSN: [id number] Arrival date & time: 03/11/24  1529      History   Chief Complaint Chief Complaint  Patient presents with   Abscess    HPI Janet [name] is a 58 y.o. female.   Patient presents to urgent care for evaluation of abscess to the right inner nose and to the upper mouth that started approximately 1 week ago.  She has noticed worsening swelling and pain to the upper gumline and into the right nostril over the last few days.  She has not noticed any drainage from the area of swelling.  Reports dental pain to the front right tooth.  She has a dentist appointment scheduled with urgent tooth next week to have tooth extracted but is concerned that they will not do the extraction if there is an infection present.  Denies recent fever, chills, body aches, nausea, vomiting, sore throat, and ear pain.  No recent antibiotic or steroid use in the last 90 days. She was recently diagnosed with breast cancer 2 days ago but has not started any radiation or chemotherapy.  She has had dental extractions in the past.   Abscess   Past Medical History:  Diagnosis Date   Allergy    Anxiety    Arthritis    Asthma    CHF (congestive heart failure) (HCC)    Congestive heart failure (CHF) (HCC)    Depression    Elevated brain natriuretic peptide (BNP) level    Hypertension    Lymphedema    Nonischemic cardiomyopathy (HCC) 03/2021   SOB (shortness of breath)    Substance abuse [facility])     Patient Active Problem List   Diagnosis Date Noted   Malignant neoplasm of upper-inner quadrant of left breast in female, estrogen receptor positive (HCC) 03/11/2024   Colon cancer screening 12/10/2023   Benign neoplasm of colon 12/10/2023   Vitamin D  deficiency 09/04/2023   Prediabetes 08/03/2022   Acute systolic heart failure (HCC)    Essential hypertension 02/17/2021   Asthma    Lymphedema    Elevated brain natriuretic peptide (BNP) level    DOE (dyspnea on  exertion) 07/28/2020   Morbid obesity (HCC) 07/28/2020    Past Surgical History:  Procedure Laterality Date   ABDOMINAL HYSTERECTOMY     BREAST BIOPSY Right    BREAST BIOPSY Left 10/16/2023   times 3   BREAST BIOPSY Left 10/16/2023   US  LT BREAST BX W LOC DEV EA ADD LESION IMG BX SPEC US  GUIDE 10/16/2023 [facility]   BREAST BIOPSY Left 10/16/2023   US  LT BREAST BX W LOC DEV 1ST LESION IMG BX SPEC US  GUIDE 10/16/2023 [facility]   BREAST BIOPSY Left 10/16/2023   US  LT BREAST BX W LOC DEV EA ADD LESION IMG BX SPEC US  GUIDE 10/16/2023 [facility]   BREAST BIOPSY Left 02/27/2024   US  LT RADIOACTIVE SEED LOC 02/27/2024 [facility]   BREAST BIOPSY Left 02/27/2024   US  LT RADIOACTIVE SEED EA ADD LESION 02/27/2024 [facility]   BREAST LUMPECTOMY WITH RADIOACTIVE SEED LOCALIZATION Left 02/28/2024   Procedure: LEFT BREAST SEED LUMPECTOMY;  Surgeon: Sim Dryer, MD;  Location: [facility];  Service: General;  Laterality: Left;  3 seeds   CESAREAN SECTION     COLONOSCOPY WITH PROPOFOL  N/A 12/10/2023   Procedure: COLONOSCOPY WITH PROPOFOL ;  Surgeon: Ace Holder, MD;  Location: [facility];  Service: Gastroenterology;  Laterality: N/A;   HEMOSTASIS CLIP PLACEMENT  12/10/2023  Procedure: HEMOSTASIS CLIP PLACEMENT;  Surgeon: Ace Holder, MD;  Location: [facility];  Service: Gastroenterology;;   HERNIA REPAIR     umbilical as a child   POLYPECTOMY  12/10/2023   Procedure: POLYPECTOMY;  Surgeon: Ace Holder, MD;  Location: [facility];  Service: Gastroenterology;;   RIGHT/LEFT HEART CATH AND CORONARY ANGIOGRAPHY N/A 04/01/2021   Procedure: RIGHT/LEFT HEART CATH AND CORONARY ANGIOGRAPHY;  Surgeon: Lucendia Rusk, MD;  Location: [facility] INVASIVE CV [facility];  Service: Cardiovascular;  Laterality: N/A;   TUBAL LIGATION      OB History   No obstetric history on file.      Home Medications    Prior to Admission medications   Medication Sig Start  Date End Date Taking? Authorizing Provider  clindamycin  (CLEOCIN ) 300 MG capsule Take 1 capsule (300 mg total) by mouth 3 (three) times daily for 7 days. 03/11/24 03/18/24 Yes StanhopeDanny Dye, FNP  albuterol  (PROVENTIL ) (2.5 MG/3ML) 0.083% nebulizer solution Take 3 mLs (2.5 mg total) by nebulization every 4 (four) hours as needed for wheezing or shortness of breath. 01/26/24   Ward, Char Common, PA-C  aspirin  81 MG chewable tablet Chew 81 mg by mouth daily. Patient not taking: Reported on 03/11/2024    [provider]  Budeson-Glycopyrrol-Formoterol  (BREZTRI  AEROSPHERE) 160-9-4.8 MCG/ACT AERO Inhale 2 puffs into the lungs in the morning and at bedtime. 12/17/23   Senaida Dama, NP  carvedilol  (COREG ) 12.5 MG tablet Take 1 tablet (12.5 mg total) by mouth 2 (two) times daily with a meal. 11/15/23   Sabharwal, Aditya, DO  fluticasone  (FLONASE ) 50 MCG/ACT nasal spray Place 2 sprays into both nostrils daily. Patient taking differently: Place 2 sprays into both nostrils daily as needed for allergies or rhinitis. 01/18/23   Senaida Dama, NP  furosemide  (LASIX ) 20 MG tablet TAKE ONE TABLET (20 MG) BY MOUTH DAILY AT 9AM Patient taking differently: Take 20 mg by mouth daily as needed for fluid or edema. 11/15/23   Sabharwal, Aditya, DO  hydrOXYzine  (VISTARIL ) 50 MG capsule Take 100 mg by mouth at bedtime. 01/02/23   [provider]  montelukast  (SINGULAIR ) 10 MG tablet TAKE 1 TABLET(10 MG) BY MOUTH AT BEDTIME 03/30/23   Senaida Dama, NP  sacubitril -valsartan  (ENTRESTO ) 49-51 MG TAKE ONE TABLET BY MOUTH TWICE DAILY @ 9AM-5PM 01/29/24   Sabharwal, Aditya, DO  spironolactone  (ALDACTONE ) 25 MG tablet TAKE 1 TABLET(25 MG) BY MOUTH DAILY 04/12/23   Sabharwal, Aditya, DO  traZODone  (DESYREL ) 150 MG tablet Take 150 mg by mouth at bedtime.    [provider]    Family History Family History  Problem Relation Age of Onset   Colon polyps Mother    Hypertension Mother    Stroke Mother     Deep vein thrombosis Mother    Congestive Heart Failure Mother    Arthritis Mother    Asthma Mother    Depression Mother    Other Father        history unknown   Obesity Sister    Diabetes Sister    Obesity Brother    Congestive Heart Failure Maternal Grandmother    Colon cancer Neg Hx    Esophageal cancer Neg Hx    Rectal cancer Neg Hx    Stomach cancer Neg Hx     Social History Social History   Tobacco Use   Smoking status: Never    Passive exposure: Current   Smokeless tobacco: Never  Vaping Use   Vaping  status: Never Used  Substance Use Topics   Alcohol use: Yes    Alcohol/week: 3.0 standard drinks of alcohol    Types: 3 Shots of liquor per week    Comment: 1 pint per week   Drug use: Yes    Frequency: 1.0 times per week    Types: Marijuana    Comment: uses marijuana twice a week     Allergies   Clarithromycin, Penicillins, and Penicillin g   Review of Systems Review of Systems Per HPI  Physical Exam Triage Vital Signs ED Triage Vitals  Encounter Vitals Group     BP 03/11/24 1618 107/71     Systolic BP Percentile --      Diastolic BP Percentile --      Pulse Rate 03/11/24 1618 70     Resp 03/11/24 1618 16     Temp 03/11/24 1618 98.2 F (36.8 C)     Temp Source 03/11/24 1618 Oral     SpO2 03/11/24 1618 93 %     Weight 03/11/24 1613 271 lb (122.9 kg)     Height 03/11/24 1613 5\' 8"  (1.727 m)     Head Circumference --      Peak Flow --      Pain Score 03/11/24 1613 10     Pain Loc --      Pain Education --      Exclude from Growth Chart --    No data found.  Updated Vital Signs BP 107/71 (BP Location: Left Arm)   Pulse 70   Temp 98.2 F (36.8 C) (Oral)   Resp 16   Ht 5\' 8"  (1.727 m)   Wt 271 lb (122.9 kg)   SpO2 93%   BMI 41.21 kg/m   Visual Acuity Right Eye Distance:   Left Eye Distance:   Bilateral Distance:    Right Eye Near:   Left Eye Near:    Bilateral Near:     Physical Exam Vitals and nursing note reviewed.   Constitutional:      Appearance: She is not ill-appearing or toxic-appearing.  HENT:     Head: Normocephalic and atraumatic.     Right Ear: Hearing, tympanic membrane, ear canal and external ear normal.     Left Ear: Hearing, tympanic membrane, ear canal and external ear normal.     Nose: Nose normal.      Mouth/Throat:     Lips: Pink.     Mouth: Mucous membranes are moist. No injury or oral lesions.     Dentition: Abnormal dentition (Multiple missing teeth.). Dental tenderness present. No gingival swelling or dental abscesses (No dental abscesses palpable or observable on inspection.).     Tongue: No lesions.     Pharynx: Oropharynx is clear. Uvula midline. No pharyngeal swelling, oropharyngeal exudate, posterior oropharyngeal erythema, uvula swelling or postnasal drip.     Tonsils: No tonsillar exudate.      Comments: Tenderness to palpation over the medial upper gumline. Eyes:     General: Lids are normal. Vision grossly intact. Gaze aligned appropriately.     Extraocular Movements: Extraocular movements intact.     Conjunctiva/sclera: Conjunctivae normal.  Neck:     Trachea: Trachea and phonation normal.  Pulmonary:     Effort: Pulmonary effort is normal.  Musculoskeletal:     Cervical back: Neck supple.  Lymphadenopathy:     Cervical: Cervical adenopathy present.  Skin:    General: Skin is warm and dry.     Capillary Refill:  Capillary refill takes less than 2 seconds.     Findings: No rash.  Neurological:     General: No focal deficit present.     Mental Status: She is alert and oriented to person, place, and time. Mental status is at baseline.     Cranial Nerves: No dysarthria or facial asymmetry.  Psychiatric:        Mood and Affect: Mood normal.        Speech: Speech normal.        Behavior: Behavior normal.        Thought Content: Thought content normal.        Judgment: Judgment normal.      UC Treatments / Results  Labs (all labs ordered are listed, but  only abnormal results are displayed) Labs Reviewed - No data to display  EKG   Radiology No results found.  Procedures Procedures (including critical care time)  Medications Ordered in UC Medications - No data to display  Initial Impression / Assessment and Plan / UC Course  I have reviewed the triage vital signs and the nursing notes.  Pertinent labs & imaging results that were available during my care of the patient were reviewed by me and considered in my medical decision making (see chart for details).   1.  Dental infection, dental pain, abscess or cellulitis of nose external Dental pain likely secondary to infection.  Abscess of the external nose appears infected. Will treat dental infection with clindamycin  antibiotic every 8 hours for the next 7 days.  She is allergic to penicillins, therefore deferred Augmentin prescription. She has an appointment with urgent tooth to have the tooth extracted. Advised to follow-up with urgent tooth and call them to discuss moving the appointment as needed for time to treat the infection. Tylenol  as needed for pain. Warm compresses to the external abscess of the nose recommended.  Abscess is a small pustule and is nonfluctuant, therefore this was not drained today. Return precautions discussed should symptoms fail to improve.  No red flags on exam.  Counseled patient on potential for adverse effects with medications prescribed/recommended today, strict ER and return-to-clinic precautions discussed, patient verbalized understanding.    Final Clinical Impressions(s) / UC Diagnoses   Final diagnoses:  Dental infection  Abscess or cellulitis of nose, external     Discharge Instructions      Your dental pain is likely due to dental infection. Take  antibiotic as prescribed for the next 7 days to treat your dental infection. Continue use of ibuprofen as needed with food for dental inflammation and pain.   You may also use tylenol  as  needed for pain. Perform salt water gargles every 3-4 hours.  Follow-up with dentist as scheduled.  If you develop any new or worsening symptoms or if your symptoms do not start to improve, pleases return here or follow-up with your primary care provider. If your symptoms are severe, please go to the emergency room.      ED Prescriptions     Medication Sig Dispense Auth. Provider   clindamycin  (CLEOCIN ) 300 MG capsule Take 1 capsule (300 mg total) by mouth 3 (three) times daily for 7 days. 21 capsule Starlene Eaton, FNP      PDMP not reviewed this encounter.   Starlene Eaton, [place] 03/11/24 Darnell Elbe

## 2024-03-11 NOTE — ED Triage Notes (Signed)
 Patient here today with c/o an abscess in her nose X 1 week. Patient states that she has been taking Ibuprofen for the pain with some relief. Patient was diagnosed with breast cancer yesterday.

## 2024-03-11 NOTE — Discharge Instructions (Addendum)
 Your dental pain is likely due to dental infection. Take  antibiotic as prescribed for the next 7 days to treat your dental infection. Continue use of ibuprofen as needed with food for dental inflammation and pain.   You may also use tylenol  as needed for pain. Perform salt water gargles every 3-4 hours.  Follow-up with dentist as scheduled.  If you develop any new or worsening symptoms or if your symptoms do not start to improve, pleases return here or follow-up with your primary care provider. If your symptoms are severe, please go to the emergency room.

## 2024-03-11 NOTE — Progress Notes (Signed)
 New Breast Cancer Diagnosis: Left breast  Patient presented for mammogram which showed 3 areas of concern.  She reports some breast pain and a painful knot in her left breast.    Histology per Pathology Report: 02/28/2024  Receptor Status: ER(positive), PR (positive), Her2-neu (negative), Ki-(20%)   Surgeon and surgical plan, if any:  Dr. Afton Horse -Left breast seed lumpectomy 02/28/2024 -Left Breast Lumpectomy with radioactive seed and SLN biopsy 04/08/2024   Medical oncologist, treatment if any:   Dr. Arno Bibles 03/11/2024 -If she has a much larger tumor than 5.1 cm or multiple positive lymph nodes, then we will coordinate with her cardiologist to see how we can optimize her cardiac health and see if she will be a candidate for chemotherapy.  -If she does not have any evidence of lymph node involvement, then we may opt for adjuvant radiation followed by antiestrogen therapy for 5 to 10 years without any therapy.    Family History of Breast/Ovarian/Prostate Cancer: None  Lymphedema issues, if any: None       Pain issues, if any: None    SAFETY ISSUES: Prior radiation? No Pacemaker/ICD? No Possible current pregnancy? Hysterectomy Is the patient on methotrexate? No  Current Complaints / other details:

## 2024-03-11 NOTE — Assessment & Plan Note (Addendum)
 This is a very pleasant 58 yr old female patient with ER positive, PR positive, Her 2 neg T2Nx papillary carcinoma of the breast referred to medical oncology. She original had mammogram and biopsy November which showed an intraductal papilloma.  Surgery was postponed because of poorly controlled asthma.  She most recently had a left breast lumpectomy, final pathology showed papillary carcinoma measuring 5.1 cm, positive margin, lymph node specimen was submitted.  She is going to be rescheduled for another surgery for margin clearance as well as for sentinel lymph node biopsy.  She arrived to the appointment today by herself.  At this time given her baseline congestive heart failure and low ejection fraction and her other multiple comorbidities, we have discussed about proceeding with the second surgery.  We have briefly talked about Oncotype although I do believe that she will be a great chemotherapy candidate.  If she has a much larger tumor than 5.1 cm or multiple positive lymph nodes, then we will coordinate with her cardiologist to see how we can optimize her cardiac health and see if she will be a candidate for chemotherapy.  If she does not have any evidence of lymph node involvement, then we may opt for adjuvant radiation followed by antiestrogen therapy for 5 to 10 years without any therapy.  I have also ordered another echocardiogram since it has been more than a year from her last echo to understand her baseline cardiac status.  I have reassured her to my best ability.  At this time staging is not complete however I will be able to complete her staging during her next visit.  All her questions were answered to the best my knowledge. We plan to draw estradiol , LH and FSH today to her menopausal status.  She understands the role of antiestrogen therapy to reduce the risk of breast cancer recurrence.  Thank you for consulting us  in the care of this patient.  Please do not hesitate to contact us  with any  additional questions or concerns.

## 2024-03-12 LAB — ESTRADIOL: Estradiol: <5 pg/mL

## 2024-03-12 LAB — LUTEINIZING HORMONE: LH: 19.4 m[IU]/mL

## 2024-03-12 LAB — FOLLICLE STIMULATING HORMONE: FSH: 32.5 m[IU]/mL

## 2024-03-13 ENCOUNTER — Ambulatory Visit
Admission: RE | Admit: 2024-03-13 | Discharge: 2024-03-13 | Disposition: A | Discharge status: Home / Self Care | Source: Ambulatory Visit | Attending: Radiation Oncology | Admitting: Radiation Oncology

## 2024-03-13 ENCOUNTER — Encounter: Payer: Self-pay | Admitting: *Deleted

## 2024-03-13 ENCOUNTER — Encounter: Payer: Self-pay | Admitting: Radiation Oncology

## 2024-03-13 ENCOUNTER — Telehealth: Payer: Self-pay | Admitting: *Deleted

## 2024-03-13 VITALS — HR 73 | Temp 97.1°F | Resp 18 | Ht 68.0 in | Wt 275.0 lb

## 2024-03-13 DIAGNOSIS — J45909 Unspecified asthma, uncomplicated: Secondary | ICD-10-CM | POA: Diagnosis not present

## 2024-03-13 DIAGNOSIS — C50212 Malignant neoplasm of upper-inner quadrant of left female breast: Secondary | ICD-10-CM

## 2024-03-13 DIAGNOSIS — M129 Arthropathy, unspecified: Secondary | ICD-10-CM | POA: Insufficient documentation

## 2024-03-13 DIAGNOSIS — Z7982 Long term (current) use of aspirin: Secondary | ICD-10-CM | POA: Insufficient documentation

## 2024-03-13 DIAGNOSIS — Z170 Estrogen receptor positive status [ER+]: Secondary | ICD-10-CM | POA: Diagnosis not present

## 2024-03-13 DIAGNOSIS — I110 Hypertensive heart disease with heart failure: Secondary | ICD-10-CM | POA: Diagnosis not present

## 2024-03-13 DIAGNOSIS — I10 Essential (primary) hypertension: Secondary | ICD-10-CM | POA: Diagnosis not present

## 2024-03-13 DIAGNOSIS — F1290 Cannabis use, unspecified, uncomplicated: Secondary | ICD-10-CM | POA: Insufficient documentation

## 2024-03-13 DIAGNOSIS — I429 Cardiomyopathy, unspecified: Secondary | ICD-10-CM | POA: Diagnosis not present

## 2024-03-13 DIAGNOSIS — Z79899 Other long term (current) drug therapy: Secondary | ICD-10-CM | POA: Insufficient documentation

## 2024-03-13 DIAGNOSIS — I509 Heart failure, unspecified: Secondary | ICD-10-CM | POA: Diagnosis not present

## 2024-03-13 NOTE — Progress Notes (Signed)
 Radiation Oncology         ([phone]) [phone] ________________________________  Name: Janet [name]        MRN: [id number]  Date of Service: 03/13/2024 DOB: 22-Nov-1965  BM:[id number], Annalee Barren, NP  Sim Dryer, MD     REFERRING PHYSICIAN: Sim Dryer, MD   DIAGNOSIS: The encounter diagnosis was Malignant neoplasm of upper-inner quadrant of left breast in female, estrogen receptor positive (HCC).   HISTORY OF PRESENT ILLNESS: Janet [name] is a 58 y.o. female seen at the request of Dr. Afton Horse for a new diagnosis of breast cancer.  The patient had a palpable and tender lump that she noted in her left breast and presented in November 2024 for mammography.  Her diagnostic workup indicated 2-3 oval masses in total measuring 4.2 cm and 1-2 masses just medial to this.  By ultrasound on 10/11/2023, in the 6 o'clock position of the left breast there was a lobulated elongated mass measuring 4.2 cm in greatest dimension and in the 8 o'clock position a second area measuring 1.1 cm.  In the 9 o'clock position 1/3 area was identified measuring up to 1.3 cm and a fourth area in the 10 o'clock position showed 2 adjacent hypoechoic masses together measuring 1 cm.  Multiple normal-appearing lymph nodes were seen in her left axilla without concern for any suspicious nodes.  She was counseled on biopsies and on 10/16/2023 the 6:00 lesion was biopsied showing intraductal papilloma with usual ductal hyperplasia.  The 8:00 lesion biopsy showed fibroadenomatoid changes negative for malignancy, and the 10 o'clock position usual breast tissue with fibrocystic change including fibrosis cystic changes were noted.  She was counseled on the rationale to consider surgical resection of her papillomata's and fibrocystic disease and met with Dr. Afton Horse to discuss this.  She opted to proceed on for 1025 with a left breast lumpectomy.  Final pathology showed a grade 2 papillary carcinoma with invasive component measuring 5.1  cm and associated intermediate grade DCIS.  Her margins were positive posteriorly and medially for invasive disease and 1 mm to the superior and anterior margin for DCIS.  Her tumor was ER/PR positive, HER2 negative with a Ki-67 of 20%.  She met with Dr. Arno Bibles on 03/11/2024, she is scheduled to undergo reexcision of her margins and sentinel lymph node biopsy on 04/08/2024.  Dr. Arno Bibles plans to order Oncotype DX to determine if she would benefit from systemic chemotherapy and or if she had positive lymph nodes.  She is seen today to consider adjuvant radiation at the appropriate time.    PREVIOUS RADIATION THERAPY: No   PAST MEDICAL HISTORY:  Past Medical History:  Diagnosis Date   Allergy    Anxiety    Arthritis    Asthma    Breast cancer (HCC) 02/28/2024   CHF (congestive heart failure) (HCC)    Congestive heart failure (CHF) (HCC)    Depression    Elevated brain natriuretic peptide (BNP) level    Hypertension    Lymphedema    Nonischemic cardiomyopathy (HCC) 03/2021   SOB (shortness of breath)    Substance abuse (HCC)        PAST SURGICAL HISTORY: Past Surgical History:  Procedure Laterality Date   ABDOMINAL HYSTERECTOMY     BREAST BIOPSY Right    BREAST BIOPSY Left 10/16/2023   times 3   BREAST BIOPSY Left 10/16/2023   US  LT BREAST BX W LOC DEV EA ADD LESION IMG BX SPEC US  GUIDE 10/16/2023 [facility]  BREAST BIOPSY Left 10/16/2023   US  LT BREAST BX W LOC DEV 1ST LESION IMG BX SPEC US  GUIDE 10/16/2023 [facility]   BREAST BIOPSY Left 10/16/2023   US  LT BREAST BX W LOC DEV EA ADD LESION IMG BX SPEC US  GUIDE 10/16/2023 [facility]   BREAST BIOPSY Left 02/27/2024   US  LT RADIOACTIVE SEED LOC 02/27/2024 [facility]   BREAST BIOPSY Left 02/27/2024   US  LT RADIOACTIVE SEED EA ADD LESION 02/27/2024 [facility]   BREAST LUMPECTOMY WITH RADIOACTIVE SEED LOCALIZATION Left 02/28/2024   Procedure: LEFT BREAST SEED LUMPECTOMY;  Surgeon: Sim Dryer, MD;   Location: [facility];  Service: General;  Laterality: Left;  3 seeds   CESAREAN SECTION     COLONOSCOPY WITH PROPOFOL  N/A 12/10/2023   Procedure: COLONOSCOPY WITH PROPOFOL ;  Surgeon: Ace Holder, MD;  Location: [facility];  Service: Gastroenterology;  Laterality: N/A;   HEMOSTASIS CLIP PLACEMENT  12/10/2023   Procedure: HEMOSTASIS CLIP PLACEMENT;  Surgeon: Ace Holder, MD;  Location: [facility];  Service: Gastroenterology;;   HERNIA REPAIR     umbilical as a child   POLYPECTOMY  12/10/2023   Procedure: POLYPECTOMY;  Surgeon: Ace Holder, MD;  Location: [facility];  Service: Gastroenterology;;   RIGHT/LEFT HEART CATH AND CORONARY ANGIOGRAPHY N/A 04/01/2021   Procedure: RIGHT/LEFT HEART CATH AND CORONARY ANGIOGRAPHY;  Surgeon: Lucendia Rusk, MD;  Location: [facility];  Service: Cardiovascular;  Laterality: N/A;   TUBAL LIGATION       FAMILY HISTORY:  Family History  Problem Relation Age of Onset   Colon polyps Mother    Hypertension Mother    Stroke Mother    Deep vein thrombosis Mother    Congestive Heart Failure Mother    Arthritis Mother    Asthma Mother    Depression Mother    Other Father        history unknown   Obesity Sister    Diabetes Sister    Obesity Brother    Congestive Heart Failure Maternal Grandmother    Colon cancer Neg Hx    Esophageal cancer Neg Hx    Rectal cancer Neg Hx    Stomach cancer Neg Hx      SOCIAL HISTORY:  reports that she has never smoked. She has been exposed to tobacco smoke. She has never used smokeless tobacco. She reports current alcohol use of about 3.0 standard drinks of alcohol per week. She reports current drug use. Frequency: 1.00 time per week. Drug: Marijuana.  And other notes it indicates that the patient drinks 1 pint of liquor per week and she has not used crack cocaine in approximately 6 months time.  The patient is single and lives in [place].  She lives with her daughter and two young  granddaughters.    ALLERGIES: Clarithromycin, Penicillins, and Penicillin g   MEDICATIONS:  Current Outpatient Medications  Medication Sig Dispense Refill   albuterol  (PROVENTIL ) (2.5 MG/3ML) 0.083% nebulizer solution Take 3 mLs (2.5 mg total) by nebulization every 4 (four) hours as needed for wheezing or shortness of breath. 75 mL 2   Budeson-Glycopyrrol-Formoterol  (BREZTRI  AEROSPHERE) 160-9-4.8 MCG/ACT AERO Inhale 2 puffs into the lungs in the morning and at bedtime. 10.7 g 5   carvedilol  (COREG ) 12.5 MG tablet Take 1 tablet (12.5 mg total) by mouth 2 (two) times daily with a meal. 60 tablet 11   clindamycin  (CLEOCIN ) 300 MG capsule Take 1 capsule (300 mg total) by mouth 3 (three) times  daily for 7 days. 21 capsule 0   DULoxetine (CYMBALTA) 30 MG capsule Take 30 mg by mouth 2 (two) times daily.     fluticasone  (FLONASE ) 50 MCG/ACT nasal spray Place 2 sprays into both nostrils daily. (Patient taking differently: Place 2 sprays into both nostrils daily as needed for allergies or rhinitis.) 48 g 0   furosemide  (LASIX ) 20 MG tablet TAKE ONE TABLET (20 MG) BY MOUTH DAILY AT 9AM (Patient taking differently: Take 20 mg by mouth daily as needed for fluid or edema.) 30 tablet 11   hydrOXYzine  (VISTARIL ) 50 MG capsule Take 100 mg by mouth at bedtime.     ibuprofen (ADVIL) 200 MG tablet Take 200 mg by mouth every 6 (six) hours as needed for fever, headache or mild pain (pain score 1-3).     montelukast  (SINGULAIR ) 10 MG tablet TAKE 1 TABLET(10 MG) BY MOUTH AT BEDTIME 90 tablet 3   sacubitril -valsartan  (ENTRESTO ) 49-51 MG TAKE ONE TABLET BY MOUTH TWICE DAILY @ 9AM-5PM 180 tablet 1   spironolactone  (ALDACTONE ) 25 MG tablet TAKE 1 TABLET(25 MG) BY MOUTH DAILY 90 tablet 3   traZODone  (DESYREL ) 150 MG tablet Take 150 mg by mouth at bedtime.     aspirin  81 MG chewable tablet Chew 81 mg by mouth daily. (Patient not taking: Reported on 03/11/2024)     No current facility-administered medications for this  encounter.     REVIEW OF SYSTEMS: On review of systems, the patient reports that she is doing well and hopeful for a good report from her upcoming surgery. She notes a history of CHF and prior to that has navigated lower extremity lymphedema for years. No other complaints are verbalized.      PHYSICAL EXAM:  Wt Readings from Last 3 Encounters:  03/13/24 275 lb (124.7 kg)  03/11/24 271 lb (122.9 kg)  03/11/24 271 lb 12.8 oz (123.3 kg)   Temp Readings from Last 3 Encounters:  03/13/24 (!) 97.1 F (36.2 C)  03/11/24 98.2 F (36.8 C) (Oral)  03/11/24 97.6 F (36.4 C) (Temporal)   BP Readings from Last 3 Encounters:  03/11/24 107/71  03/11/24 114/71  02/28/24 120/82   Pulse Readings from Last 3 Encounters:  03/13/24 73  03/11/24 70  03/11/24 79    In general this is a well appearing African-American female in no acute distress. She's alert and oriented x4 and appropriate throughout the examination. Cardiopulmonary assessment is negative for acute distress and she exhibits normal effort. Bilateral breast exam is deferred.    ECOG = 1  0 - Asymptomatic (Fully active, able to carry on all predisease activities without restriction)  1 - Symptomatic but completely ambulatory (Restricted in physically strenuous activity but ambulatory and able to carry out work of a light or sedentary nature. For example, light housework, office work)  2 - Symptomatic, <50% in bed during the day (Ambulatory and capable of all self care but unable to carry out any work activities. Up and about more than 50% of waking hours)  3 - Symptomatic, >50% in bed, but not bedbound (Capable of only limited self-care, confined to bed or chair 50% or more of waking hours)  4 - Bedbound (Completely disabled. Cannot carry on any self-care. Totally confined to bed or chair)  5 - Death   Aurea Blossom MM, Creech RH, Tormey DC, et al. [phone]). "Toxicity and response criteria of the [facility]". Am.  Hillard Lowes. Oncol. 5 (6): 649-55    LABORATORY DATA:  Lab  Results  Component Value Date   WBC 8.6 03/11/2024   HGB 13.7 03/11/2024   HCT 42.0 03/11/2024   MCV 83.3 03/11/2024   PLT 218 03/11/2024   Lab Results  Component Value Date   NA 142 03/11/2024   K 4.3 03/11/2024   CL 108 03/11/2024   CO2 30 03/11/2024   Lab Results  Component Value Date   ALT 10 03/11/2024   AST 12 (L) 03/11/2024   ALKPHOS 68 03/11/2024   BILITOT 0.5 03/11/2024      RADIOGRAPHY: MM Breast Surgical Specimen Result Date: 02/28/2024 CLINICAL DATA:  Post lumpectomy specimen radiograph EXAM: SPECIMEN RADIOGRAPH OF THE LEFT BREAST COMPARISON:  Previous exam(s). FINDINGS: Status post excision of the left breast. The two radioactive seeds and biopsy marker clip are present, completely intact, and were marked for pathology. IMPRESSION: Specimen radiograph of the left breast. Electronically Signed   By: Allena Ito M.D.   On: 02/28/2024 15:25   MM CLIP PLACEMENT LEFT Result Date: 02/27/2024 CLINICAL DATA:  Post ultrasound-guided bracketing of an at least 4.3 cm papilloma in the left breast at the 6 o'clock position EXAM: DIAGNOSTIC LEFT MAMMOGRAM POST ULTRASOUND-GUIDED RADIOACTIVE SEED PLACEMENT COMPARISON:  Previous exam(s). ACR Breast Density Category b: There are scattered areas of fibroglandular density. FINDINGS: Mammographic images were obtained following ultrasound-guided radioactive seed placement. Appropriate positioning of the 2 radioactive seeds on either side of the biopsy proven intraductal papilloma in the left breast at the 6 o'clock position. IMPRESSION: Appropriate positioning of the 2 radioactive seeds on either side of the biopsy proven intraductal papilloma in the left breast at the 6 o'clock position. Final Assessment: Post Procedure Mammograms for Seed Placement BI-RADS CATEGORY  6M: Post-Procedure Mammogram for Marker Placement Electronically Signed   By: Alger Infield M.D.   On: 02/27/2024  13:59   US  LT RADIOACTIVE SEED LOC Result Date: 02/27/2024 CLINICAL DATA:  Patient with an intraductal papilloma at site of ribbon shaped biopsy marking clip in the left breast at the 6 o'clock position presents for preoperative radioactive seed bracketed localization. Note that on today's preprocedure sonographic evaluation of the left breast, this papilloma measured at least 4.8 cm. EXAM: ULTRASOUND GUIDED RADIOACTIVE SEED LOCALIZATION OF THE LEFT BREAST COMPARISON:  Previous exam(s). FINDINGS: Patient presents for radioactive seed localization prior to left breast excision. I met with the patient and we discussed the procedure of seed localization including benefits and alternatives. We discussed the high likelihood of a successful procedure. We discussed the risks of the procedure including infection, bleeding, tissue injury and further surgery. We discussed the low dose of radioactivity involved in the procedure. Informed, written consent was given. The usual time-out protocol was performed immediately prior to the procedure. Site 1: Left breast 6 o'clock periareolar: Using ultrasound guidance, sterile technique, 1% lidocaine  and an I-125 radioactive seed, the most central portion of the biopsy proven papilloma was localized using a lateral to medial approach. The follow-up mammogram images confirm the seed in the expected location and were marked for Dr. Afton Horse. Follow-up survey of the patient confirms presence of the radioactive seed. Order number of I-125 seed:  [id number]. Total activity:  0.271 millicuries reference Date: 01/10/2024 Site 2: Left breast 6 o'clock 4 cm from nipple: Using ultrasound guidance, sterile technique, 1% lidocaine  and an I-125 radioactive seed, the most peripheral portion of the biopsy proven papilloma was localized using a lateral to medial approach. The follow-up mammogram images confirm the seed in the expected location and were marked for  Dr. Afton Horse. Follow-up survey of the  patient confirms presence of the radioactive seed. Order number of I-125 seed:  [id number]. Total activity:  0.271 millicuries reference Date: 01/10/2024 The patient tolerated the procedure well and was [facility]. She was given instructions regarding seed removal. IMPRESSION: 1.  Radioactive seed bracketed localization left breast. 2. Biopsy proven papilloma in the left breast at the 6 o'clock position measures at least 4.8 cm. Electronically Signed   By: Alger Infield M.D.   On: 02/27/2024 13:53   US  LT RADIOACTIVE SEED EA ADD LESION Result Date: 02/27/2024 CLINICAL DATA:  Patient with an intraductal papilloma at site of ribbon shaped biopsy marking clip in the left breast at the 6 o'clock position presents for preoperative radioactive seed bracketed localization. Note that on today's preprocedure sonographic evaluation of the left breast, this papilloma measured at least 4.8 cm. EXAM: ULTRASOUND GUIDED RADIOACTIVE SEED LOCALIZATION OF THE LEFT BREAST COMPARISON:  Previous exam(s). FINDINGS: Patient presents for radioactive seed localization prior to left breast excision. I met with the patient and we discussed the procedure of seed localization including benefits and alternatives. We discussed the high likelihood of a successful procedure. We discussed the risks of the procedure including infection, bleeding, tissue injury and further surgery. We discussed the low dose of radioactivity involved in the procedure. Informed, written consent was given. The usual time-out protocol was performed immediately prior to the procedure. Site 1: Left breast 6 o'clock periareolar: Using ultrasound guidance, sterile technique, 1% lidocaine  and an I-125 radioactive seed, the most central portion of the biopsy proven papilloma was localized using a lateral to medial approach. The follow-up mammogram images confirm the seed in the expected location and were marked for Dr. Afton Horse. Follow-up survey of the  patient confirms presence of the radioactive seed. Order number of I-125 seed:  [id number]. Total activity:  0.271 millicuries reference Date: 01/10/2024 Site 2: Left breast 6 o'clock 4 cm from nipple: Using ultrasound guidance, sterile technique, 1% lidocaine  and an I-125 radioactive seed, the most peripheral portion of the biopsy proven papilloma was localized using a lateral to medial approach. The follow-up mammogram images confirm the seed in the expected location and were marked for Dr. Afton Horse. Follow-up survey of the patient confirms presence of the radioactive seed. Order number of I-125 seed:  [id number]. Total activity:  0.271 millicuries reference Date: 01/10/2024 The patient tolerated the procedure well and was released from the [facility]. She was given instructions regarding seed removal. IMPRESSION: 1.  Radioactive seed bracketed localization left breast. 2. Biopsy proven papilloma in the left breast at the 6 o'clock position measures at least 4.8 cm. Electronically Signed   By: Alger Infield M.D.   On: 02/27/2024 13:53       IMPRESSION/PLAN: 1. At least Stage IB, pT3, cN0M0, grade 2, ER/PR positive invasive papillary carcinoma of the left breast. Dr. Jeryl Moris discusses the pathology findings and reviews the nature of early stage left breast disease.  The patient is healing well from her initial surgery and is planning reexcision with sentinel lymph node biopsy in several weeks with Dr. Afton Horse.  Dr. Arno Bibles is considering Oncotype Dx score depending on her final pathology to determine a role for systemic therapy. Provided that chemotherapy is not indicated, the patient's course would then be followed by external radiotherapy to the breast  to reduce risks of local recurrence. Dr. Arno Bibles anticipates adjuvant antiestrogen therapy to follow. We discussed the risks, benefits, short, and long term effects of  radiotherapy, as well as the curative intent, and the patient is interested in proceeding. Dr.  Jeryl Moris discusses the delivery and logistics of radiotherapy and anticipates a course of 4 or up to 6 1/2 weeks of radiotherapy to the left breast with deep inspiration breath-hold technique.  We will plan to see her back in mid June to review radiation and proceed with simulation as long as she does not need chemotherapy.     In a visit lasting 60 minutes, greater than 50% of the time was spent face to face reviewing her case, as well as in preparation of, discussing, and coordinating the patient's care.  The above documentation reflects my direct findings during this shared patient visit. Please see the separate note by Dr. Jeryl Moris on this date for the remainder of the patient's plan of care.    Shelvia Dick, [facility]    **Disclaimer: This note was dictated with voice recognition software. Similar sounding words can inadvertently be transcribed and this note may contain transcription errors which may not have been corrected upon publication of note.**

## 2024-03-13 NOTE — Therapy (Signed)
 OUTPATIENT PHYSICAL THERAPY BREAST CANCER BASELINE EVALUATION   Patient Name: Janet [name] MRN: [id number] DOB:Jan 24, 1966, 58 y.o., female Today's Date: 03/14/2024  END OF SESSION:  PT End of Session - 03/14/24 0856     Visit Number 1    Number of Visits 2    Date for PT Re-Evaluation 05/09/24    PT Start Time 0900    PT Stop Time 0938    PT Time Calculation (min) 38 min    Activity Tolerance Patient tolerated treatment well    Behavior During Therapy [facility] for tasks assessed/performed             Past Medical History:  Diagnosis Date   Allergy    Anxiety    Arthritis    Asthma    Breast cancer (HCC) 02/28/2024   CHF (congestive heart failure) (HCC)    Congestive heart failure (CHF) (HCC)    Depression    Elevated brain natriuretic peptide (BNP) level    Hypertension    Lymphedema    Nonischemic cardiomyopathy (HCC) 03/2021   SOB (shortness of breath)    Substance abuse (HCC)    Past Surgical History:  Procedure Laterality Date   ABDOMINAL HYSTERECTOMY     BREAST BIOPSY Right    BREAST BIOPSY Left 10/16/2023   times 3   BREAST BIOPSY Left 10/16/2023   US  LT BREAST BX W LOC DEV EA ADD LESION IMG BX SPEC US  GUIDE 10/16/2023 [facility]   BREAST BIOPSY Left 10/16/2023   US  LT BREAST BX W LOC DEV 1ST LESION IMG BX SPEC US  GUIDE 10/16/2023 [facility]   BREAST BIOPSY Left 10/16/2023   US  LT BREAST BX W LOC DEV EA ADD LESION IMG BX SPEC US  GUIDE 10/16/2023 [facility]   BREAST BIOPSY Left 02/27/2024   US  LT RADIOACTIVE SEED LOC 02/27/2024 [facility]   BREAST BIOPSY Left 02/27/2024   US  LT RADIOACTIVE SEED EA ADD LESION 02/27/2024 [facility]   BREAST LUMPECTOMY WITH RADIOACTIVE SEED LOCALIZATION Left 02/28/2024   Procedure: LEFT BREAST SEED LUMPECTOMY;  Surgeon: Sim Dryer, MD;  Location: [facility];  Service: General;  Laterality: Left;  3 seeds   CESAREAN SECTION     COLONOSCOPY WITH PROPOFOL  N/A 12/10/2023   Procedure:  COLONOSCOPY WITH PROPOFOL ;  Surgeon: Ace Holder, MD;  Location: [facility];  Service: Gastroenterology;  Laterality: N/A;   HEMOSTASIS CLIP PLACEMENT  12/10/2023   Procedure: HEMOSTASIS CLIP PLACEMENT;  Surgeon: Ace Holder, MD;  Location: [facility];  Service: Gastroenterology;;   HERNIA REPAIR     umbilical as a child   POLYPECTOMY  12/10/2023   Procedure: POLYPECTOMY;  Surgeon: Ace Holder, MD;  Location: [facility];  Service: Gastroenterology;;   RIGHT/LEFT HEART CATH AND CORONARY ANGIOGRAPHY N/A 04/01/2021   Procedure: RIGHT/LEFT HEART CATH AND CORONARY ANGIOGRAPHY;  Surgeon: Lucendia Rusk, MD;  Location: [facility];  Service: Cardiovascular;  Laterality: N/A;   TUBAL LIGATION     Patient Active Problem List   Diagnosis Date Noted   Malignant neoplasm of upper-inner quadrant of left breast in female, estrogen receptor positive (HCC) 03/11/2024   Colon cancer screening 12/10/2023   Benign neoplasm of colon 12/10/2023   Vitamin D  deficiency 09/04/2023   Prediabetes 08/03/2022   Acute systolic heart failure (HCC)    Essential hypertension 02/17/2021   Asthma    Lymphedema    Elevated brain natriuretic peptide (BNP) level    DOE (dyspnea on exertion) 07/28/2020  Morbid obesity (HCC) 07/28/2020    PCP: Lavona Pounds, NP  REFERRING PROVIDER: Murleen Arms, MD  REFERRING DIAG: C50.212,Z17.0 (ICD-10-CM) - Malignant neoplasm of upper-inner quadrant of left breast in female, estrogen receptor positive (HCC)   THERAPY DIAG:  Malignant neoplasm of upper-inner quadrant of left breast in female, estrogen receptor positive (HCC)  Abnormal posture  Rationale for Evaluation and Treatment: Rehabilitation  ONSET DATE: 03/11/24  SUBJECTIVE:                                                                                                                                                                                           SUBJECTIVE  STATEMENT: Patient reports she is here today to be seen by her medical team for her newly diagnosed left breast cancer.   PERTINENT HISTORY:  Patient was diagnosed on 4/22/5 with left papillary carcinoma after lumpectomy for papilloma removal which ended up malignant. It measures 5.1 cm. It is ER/PR positive and HER2 negative with a Ki67 of 20%. She will have another surgery for margin clearance and SLNB on 04/08/24. Other hx includes CHF with EF of 30-35% and cardiomyopathy.  Pt has primary lymphedema in both legs that started in 10th grade.  Worse after pregnancy.  Wears compression.    PATIENT GOALS:   reduce lymphedema risk and learn post op HEP.   PAIN:  Are you having pain? Yes: NPRS scale: I have an abscess in my tooth which hurts now  6/10 Pain location: tooth Pain description: sharp Aggravating factors: constant  Relieving factors: medication  PRECAUTIONS: Active CA   RED FLAGS: None   HAND DOMINANCE: right  WEIGHT BEARING RESTRICTIONS: No  FALLS:  Has patient fallen in last 6 months? No  LIVING ENVIRONMENT: Patient lives with: daughter and grandchildren  Lives in: House/apartment  OCCUPATION: on disability   LEISURE: not now    PRIOR LEVEL OF FUNCTION: Independent   OBJECTIVE: Note: Objective measures were completed at Evaluation unless otherwise noted.  COGNITION: Overall cognitive status: Within functional limits for tasks assessed    POSTURE:  Forward head and rounded shoulders posture  UPPER EXTREMITY AROM/PROM:  A/PROM RIGHT   eval   Shoulder extension 65  Shoulder flexion 145  Shoulder abduction 165  Shoulder internal rotation 50  Shoulder external rotation 115    (Blank rows = not tested)  A/PROM LEFT   eval  Shoulder extension 60  Shoulder flexion 155  Shoulder abduction 153  Shoulder internal rotation 95  Shoulder external rotation 70    (Blank rows = not tested)  CERVICAL AROM: All within normal limits:   UPPER EXTREMITY  STRENGTH: 5/5 bil  no pain  LYMPHEDEMA ASSESSMENTS (in cm):   LANDMARK RIGHT   eval  10 cm proximal to olecranon process 41.5  Olecranon process 31.5  10 cm proximal to ulnar styloid process 24.7  Just proximal to ulnar styloid process 19.5  Across hand at thumb web space 20.9  At base of 2nd digit 6.8  (Blank rows = not tested)  LANDMARK LEFT   eval  10 cm proximal to olecranon process 45  Olecranon process 32  10 cm proximal to ulnar styloid process 27.3  Just proximal to ulnar styloid process 21.6  Across hand at thumb web space 21.0  At base of 2nd digit 6.8  (Blank rows = not tested)  L-DEX LYMPHEDEMA SCREENING: The patient was assessed using the L-Dex machine today to produce a lymphedema index baseline score. The patient will be reassessed on a regular basis (typically every 3 months) to obtain new L-Dex scores. If the score is > 6.5 points away from his/her baseline score indicating onset of subclinical lymphedema, it will be recommended to wear a compression garment for 4 weeks, 12 hours per day and then be reassessed. If the score continues to be > 6.5 points from baseline at reassessment, we will initiate lymphedema treatment. Assessing in this manner has a 95% rate of preventing clinically significant lymphedema.  L-DEX LYMPHEDEMA SCREENING Measurement Type: Unilateral L-DEX MEASUREMENT EXTREMITY: Upper Extremity POSITION : Standing DOMINANT SIDE: Right At Risk Side: Left BASELINE SCORE (UNILATERAL): 14 Comment: Pt has primary LE lymphedema bilaterally  QUICK DASH SURVEY: 27% limited   PATIENT EDUCATION:  Education details: Time spent educating patient on aspects of self-care to maximize post op recovery. Patient was educated on where and how to get a post op compression bra to use to reduce post op edema. Patient was also educated on the use of SOZO screenings and surveillance principles for early identification of lymphedema onset. She was instructed to use the  post op pillow in the axilla for pressure and pain relief. Patient educated on lymphedema risk reduction and post op shoulder/posture HEP. Person educated: Patient Education method: Explanation, Demonstration, Handout Education comprehension: Patient verbalized understanding and returned demonstration  HOME EXERCISE PROGRAM: Patient was instructed today in a home exercise program today for post op shoulder range of motion. These included active assist shoulder flexion in sitting, scapular retraction, wall walking with shoulder abduction, and hands behind head external rotation.  She was encouraged to do these twice a day, holding 3 seconds and repeating 5 times when permitted by her physician.   ASSESSMENT:  CLINICAL IMPRESSION: She is planning to have a left lumpectomy with SLNB on 04/08/24 due to a lumpectomy for a papilloma that ended up being cancerous.  She will have re-excision and SLNB at this time. She will benefit from a post op PT reassessment to determine needs and from L-Dex screens every 3 months for 2 years to detect subclinical lymphedema.  Pt will benefit from skilled therapeutic intervention to improve on the following deficits: Decreased knowledge of precautions, impaired UE functional use, pain, decreased ROM, postural dysfunction.   PT treatment/interventions: ADL/self-care home management, pt/family education, therapeutic exercise  REHAB POTENTIAL: Excellent  CLINICAL DECISION MAKING: Stable/uncomplicated  EVALUATION COMPLEXITY: Low   GOALS: Goals reviewed with patient? YES  LONG TERM GOALS: (STG=LTG)    Name Target Date Goal status  1 Pt will be able to verbalize understanding of pertinent lymphedema risk reduction practices relevant to her dx specifically related to skin care.  Baseline:  No  knowledge 03/14/2024 Achieved at eval  2 Pt will be able to return demo and/or verbalize understanding of the post op HEP related to regaining shoulder ROM. Baseline:  No  knowledge 03/14/2024 Achieved at eval  3 Pt will be able to verbalize understanding of the importance of viewing the post op After Breast CA Class video for further lymphedema risk reduction education and therapeutic exercise.  Baseline:  No knowledge 03/14/2024 Achieved at eval  4 Pt will demo she has regained full shoulder ROM and function post operatively compared to baselines.  Baseline: See objective measurements taken today. 05/09/24     PLAN:  PT FREQUENCY/DURATION: EVAL and 1 follow up appointment.   PLAN FOR NEXT SESSION: will reassess 3-4 weeks post op to determine needs.   Patient will follow up at outpatient cancer rehab 3-4 weeks following surgery.  If the patient requires physical therapy at that time, a specific plan will be dictated and sent to the referring physician for approval. The patient was educated today on appropriate basic range of motion exercises to begin post operatively and the importance of viewing the After Breast Cancer class video following surgery.  Patient was educated today on lymphedema risk reduction practices as it pertains to recommendations that will benefit the patient immediately following surgery.  She verbalized good understanding.    Physical Therapy Information for After Breast Cancer Surgery/Treatment:  Lymphedema is a swelling condition that you may be at risk for in your arm if you have lymph nodes removed from the armpit area.  After a sentinel node biopsy, the risk is approximately 5-9% and is higher after an axillary node dissection.  There is treatment available for this condition and it is not life-threatening.  Contact your physician or physical therapist with concerns. You may begin the 4 shoulder/posture exercises (see additional sheet) when permitted by your physician (typically a week after surgery).  If you have drains, you may need to wait until those are removed before beginning range of motion exercises.  A general recommendation is to  not lift your arms above shoulder height until drains are removed.  These exercises should be done to your tolerance and gently.  This is not a "no pain/no gain" type of recovery so listen to your body and stretch into the range of motion that you can tolerate, stopping if you have pain.  If you are having immediate reconstruction, ask your plastic surgeon about doing exercises as he or she may want you to wait. We encourage you to view the After Breast Cancer class video following surgery.  You will learn information related to lymphedema risk, prevention and treatment and additional exercises to regain mobility following surgery.   While undergoing any medical procedure or treatment, try to avoid blood pressure being taken or needle sticks from occurring on the arm on the side of cancer.   This recommendation begins after surgery and continues for the rest of your life.  This may help reduce your risk of getting lymphedema (swelling in your arm). An excellent resource for those seeking information on lymphedema is the National Lymphedema Network's web site. It can be accessed at www.lymphnet.org If you notice swelling in your hand, arm or breast at any time following surgery (even if it is many years from now), please contact your doctor or physical therapist to discuss this.  Lymphedema can be treated at any time but it is easier for you if it is treated early on.  If you feel like your  shoulder motion is not returning to normal in a reasonable amount of time, please contact your surgeon or physical therapist.  [facility] [phone]. [address], [address], [place] [address]  [facility] CLASS After Breast Cancer Class  After Breast Cancer Class is a specially designed exercise class video to assist you in a safe recover after having breast cancer surgery.  In this video you will learn how to get back to full function whether your drains were just removed or if you had  surgery a month ago. The video can be viewed on this page: https://www.boyd-meyer.org/ or on YouTube here: [email or web address].  Class Goals  Understand specific stretches to improve the flexibility of you chest and shoulder. Learn ways to safely strengthen your upper body and improve your posture. Understand the warning signs of infection and why you may be at risk for an arm infection. Learn about Lymphedema and prevention.  ** You do not need to view this video until after surgery.  Drains should be removed to participate in the recommended exercises on the video.  Patient was instructed today in a home exercise program today for post op shoulder range of motion. These included active assist shoulder flexion in sitting, scapular retraction, wall walking with shoulder abduction, and hands behind head external rotation.  She was encouraged to do these twice a day, holding 3 seconds and repeating 5 times when permitted by her physician.    Encarnacion Harris, PT 03/14/2024, 10:56 AM

## 2024-03-13 NOTE — Telephone Encounter (Signed)
Left vm with navigation resources and contact information. Request return call with questions or needs.  

## 2024-03-14 ENCOUNTER — Encounter: Payer: Self-pay | Admitting: Rehabilitation

## 2024-03-14 ENCOUNTER — Other Ambulatory Visit: Payer: Self-pay | Admitting: Cardiology

## 2024-03-14 ENCOUNTER — Ambulatory Visit: Attending: Hematology and Oncology | Admitting: Rehabilitation

## 2024-03-14 DIAGNOSIS — R293 Abnormal posture: Secondary | ICD-10-CM | POA: Diagnosis not present

## 2024-03-14 DIAGNOSIS — C50212 Malignant neoplasm of upper-inner quadrant of left female breast: Secondary | ICD-10-CM | POA: Insufficient documentation

## 2024-03-14 DIAGNOSIS — I5020 Unspecified systolic (congestive) heart failure: Secondary | ICD-10-CM

## 2024-03-14 DIAGNOSIS — Z170 Estrogen receptor positive status [ER+]: Secondary | ICD-10-CM | POA: Diagnosis not present

## 2024-03-14 DIAGNOSIS — I428 Other cardiomyopathies: Secondary | ICD-10-CM

## 2024-03-17 ENCOUNTER — Telehealth: Payer: Self-pay | Admitting: Hematology and Oncology

## 2024-03-17 ENCOUNTER — Telehealth: Payer: Self-pay | Admitting: Licensed Clinical Social Worker

## 2024-03-17 NOTE — Telephone Encounter (Signed)
 Confirmed with pt scheduled appt date and time

## 2024-03-17 NOTE — Telephone Encounter (Signed)
 [facility] Clinical Social Work  Clinical Social Work was referred by new patient protocol for assessment of psychosocial needs.  Clinical Social Worker attempted to contact patient by phone to offer support and assess for needs.   No answer. Left VM with direct contact information and brief description of support services.     Leata Dominy E Malahki Gasaway, LCSW  Clinical Social Worker [facility]

## 2024-03-18 ENCOUNTER — Telehealth: Payer: Self-pay

## 2024-03-18 NOTE — Telephone Encounter (Signed)
   Pre-operative Risk Assessment    Patient Name: Janet [name]  DOB: [birth year] MRN: [id number]   Date of last office visit: 01/08/2023, Alen Husbands, DO Date of next office visit: 03/31/2024, Alen Husbands, DO  Request for Surgical Clearance    Procedure:  Dental Extraction - Amount of Teeth to be Pulled:  5  Date of Surgery:  Clearance TBD                                Surgeon: Sherlene Diss, DDS Surgeon's Group or Practice Name: [facility] Phone number: [phone] Fax number: [phone]    Type of Clearance Requested:   - Medical  - Pharmacy:  Hold Aspirin      Type of Anesthesia:  Local and Nitrous Oxide   Additional requests/questions:    SignedDelroy Fields   03/18/2024, 9:00 AM

## 2024-03-18 NOTE — Telephone Encounter (Signed)
 Spoke with patient who is agreeable to do an office visit with Lawana Pray, NP on 5/16 at 8:50 am. I will forward the updates to the requesting provider's office.

## 2024-03-18 NOTE — Telephone Encounter (Signed)
    Primary Cardiologist:Philip Nahser, MD  Chart reviewed as part of pre-operative protocol coverage. Because of Janet [name]'s past medical history and time since last visit, he/she will require a follow-up visit in order to better assess preoperative cardiovascular risk.  Pre-op covering staff: - Please schedule in office appointment and call patient to inform them. - Please contact requesting surgeon's office via preferred method (i.e, phone, fax) to inform them of need for appointment prior to surgery.  If applicable, this message will also be routed to pharmacy pool and/or primary cardiologist for input on holding anticoagulant/antiplatelet agent as requested below so that this information is available at time of patient's appointment.   Carie Charity, NP  03/18/2024, 9:23 AM

## 2024-03-19 ENCOUNTER — Telehealth: Payer: Self-pay

## 2024-03-19 ENCOUNTER — Other Ambulatory Visit: Payer: Self-pay | Admitting: *Deleted

## 2024-03-19 DIAGNOSIS — C50212 Malignant neoplasm of upper-inner quadrant of left female breast: Secondary | ICD-10-CM

## 2024-03-19 NOTE — Telephone Encounter (Signed)
 Pt given results per MD. She verbalized thanks and understanding.

## 2024-03-19 NOTE — Telephone Encounter (Signed)
-----   Message from [place] Iruku sent at 03/12/2024  6:43 PM EDT ----- Ms Keene is post menopausal.

## 2024-03-27 ENCOUNTER — Other Ambulatory Visit: Payer: Self-pay | Admitting: Family

## 2024-03-27 NOTE — Telephone Encounter (Signed)
 Copied from CRM [id number]. Topic: Clinical - Medication Refill >> Mar 27, 2024  2:02 PM Rosamond Comes wrote: Patient calling in requesting medication refill not on list   Medication: albuterol  rescue inhaler    Has the patient contacted their pharmacy? Yes   pharmacy stated patient needed to call primary care  This is the patient's preferred pharmacy:   [id number] - [place], [place] - [address] AT [facility] OF [address] [place] [address] Phone: [phone] Fax: [phone]    Is this the correct pharmacy for this prescription? Yes If no, delete pharmacy and type the correct one.   Has the prescription been filled recently? No  Is the patient out of the medication? Yes  Has the patient been seen for an appointment in the last year OR does the patient have an upcoming appointment? No  Can we respond through MyChart? Yes  Agent: Please be advised that Rx refills may take up to 3 business days. We ask that you follow-up with your pharmacy.

## 2024-03-28 ENCOUNTER — Telehealth: Payer: Self-pay

## 2024-03-28 NOTE — Telephone Encounter (Signed)
 Copied from CRM [id number]. Topic: Clinical - Medication Refill >> Mar 27, 2024  2:02 PM Rosamond Comes wrote: Patient calling in requesting medication refill not on list   Medication: albuterol  rescue inhaler    Has the patient contacted their pharmacy? Yes   pharmacy stated patient needed to call primary care  This is the patient's preferred pharmacy:   [id number] - [place], [place] - [address] AT [facility] OF [address] [place] [address] Phone: [phone] Fax: [phone]    Is this the correct pharmacy for this prescription? Yes If no, delete pharmacy and type the correct one.   Has the prescription been filled recently? No  Is the patient out of the medication? Yes  Has the patient been seen for an appointment in the last year OR does the patient have an upcoming appointment? No  Can we respond through MyChart? Yes  Agent: Please be advised that Rx refills may take up to 3 business days. We ask that you follow-up with your pharmacy. >> Mar 27, 2024  2:40 PM Chantha C wrote: Patient sent a refill request to her pcp for Albuterol  sulfate HFA it's a rescue inhaler,  unsure of the dosage and name. Pcp office advised patient to contact her pulmonologist Dr. Marygrace Snellen. Patient states has 24 puffs left, patient use medication as needed. Patient had an upcoming surgery 04/08/24, has breast surgery. Patient does not want to be out of medication. Please send refill to [id number] - , [place] - [address] AT [facility] [address] [place] [address] Phone: [phone] Fax: [phone]  Dr. Marygrace Snellen please advise if OK to send RX for albuterol  inhaler. Not mentioned on med list. Pt also hasn't been seen within a year. Thanks!

## 2024-03-31 ENCOUNTER — Telehealth: Payer: Self-pay

## 2024-03-31 ENCOUNTER — Other Ambulatory Visit: Payer: Self-pay | Admitting: Family

## 2024-03-31 ENCOUNTER — Ambulatory Visit (HOSPITAL_COMMUNITY)
Admission: RE | Admit: 2024-03-31 | Discharge: 2024-03-31 | Disposition: A | Discharge status: Home / Self Care | Source: Ambulatory Visit | Attending: Cardiology | Admitting: Cardiology

## 2024-03-31 ENCOUNTER — Encounter (HOSPITAL_COMMUNITY): Payer: Self-pay | Admitting: Cardiology

## 2024-03-31 VITALS — BP 112/74 | HR 73 | Ht 68.0 in | Wt 273.0 lb

## 2024-03-31 DIAGNOSIS — Z79899 Other long term (current) drug therapy: Secondary | ICD-10-CM | POA: Insufficient documentation

## 2024-03-31 DIAGNOSIS — Z6841 Body Mass Index (BMI) 40.0 and over, adult: Secondary | ICD-10-CM | POA: Insufficient documentation

## 2024-03-31 DIAGNOSIS — I5022 Chronic systolic (congestive) heart failure: Secondary | ICD-10-CM

## 2024-03-31 DIAGNOSIS — J4540 Moderate persistent asthma, uncomplicated: Secondary | ICD-10-CM

## 2024-03-31 DIAGNOSIS — C50912 Malignant neoplasm of unspecified site of left female breast: Secondary | ICD-10-CM | POA: Diagnosis not present

## 2024-03-31 DIAGNOSIS — E119 Type 2 diabetes mellitus without complications: Secondary | ICD-10-CM | POA: Insufficient documentation

## 2024-03-31 DIAGNOSIS — F1411 Cocaine abuse, in remission: Secondary | ICD-10-CM | POA: Diagnosis not present

## 2024-03-31 DIAGNOSIS — E669 Obesity, unspecified: Secondary | ICD-10-CM | POA: Diagnosis not present

## 2024-03-31 DIAGNOSIS — F1910 Other psychoactive substance abuse, uncomplicated: Secondary | ICD-10-CM | POA: Diagnosis not present

## 2024-03-31 DIAGNOSIS — I428 Other cardiomyopathies: Secondary | ICD-10-CM

## 2024-03-31 DIAGNOSIS — J45909 Unspecified asthma, uncomplicated: Secondary | ICD-10-CM | POA: Insufficient documentation

## 2024-03-31 DIAGNOSIS — I110 Hypertensive heart disease with heart failure: Secondary | ICD-10-CM | POA: Diagnosis not present

## 2024-03-31 DIAGNOSIS — I10 Essential (primary) hypertension: Secondary | ICD-10-CM

## 2024-03-31 DIAGNOSIS — Z7722 Contact with and (suspected) exposure to environmental tobacco smoke (acute) (chronic): Secondary | ICD-10-CM | POA: Insufficient documentation

## 2024-03-31 DIAGNOSIS — Z170 Estrogen receptor positive status [ER+]: Secondary | ICD-10-CM

## 2024-03-31 DIAGNOSIS — I5020 Unspecified systolic (congestive) heart failure: Secondary | ICD-10-CM | POA: Insufficient documentation

## 2024-03-31 DIAGNOSIS — C50212 Malignant neoplasm of upper-inner quadrant of left female breast: Secondary | ICD-10-CM | POA: Diagnosis not present

## 2024-03-31 MED ORDER — DAPAGLIFLOZIN PROPANEDIOL 10 MG PO TABS: 10.0000 mg | ORAL_TABLET | Freq: Every day | ORAL | 5 refills | Status: DC

## 2024-03-31 MED ORDER — ALBUTEROL SULFATE HFA 108 (90 BASE) MCG/ACT IN AERS
2.0000 | INHALATION_SPRAY | Freq: Four times a day (QID) | RESPIRATORY_TRACT | 2 refills | Status: AC | PRN
Start: 2024-03-31 — End: ?

## 2024-03-31 NOTE — Patient Instructions (Signed)
 Medication Changes:  RESTART FARXIGA  10MG  ONCE DAILY   Follow-Up in: AS SCHEDULED IN 4 WEEKS   At the [facility], you and your health needs are our priority. We have a designated team specialized in the treatment of Heart Failure. This Care Team includes your primary Heart Failure Specialized Cardiologist (physician), Advanced Practice Providers (APPs- Physician Assistants and Nurse Practitioners), and Pharmacist who all work together to provide you with the care you need, when you need it.   You may see any of the following providers on your designated Care Team at your next follow up:  Dr. Jules Oar Dr. Peder Bourdon Dr. Alwin Baars Dr. Judyth Nunnery Nieves Bars, NP Ruddy Corral, [place] [facility] [place], [place] Dennise Fitz, NP [place] Lee, NP Luster Salters, PharmD   Please be sure to bring in all your medications bottles to every appointment.   Need to Contact Us :  If you have any questions or concerns before your next appointment please send us  a message through [place] or call our office at [phone].    TO LEAVE A MESSAGE FOR THE NURSE SELECT OPTION 2, PLEASE LEAVE A MESSAGE INCLUDING: YOUR NAME DATE OF BIRTH CALL BACK NUMBER REASON FOR CALL**this is important as we prioritize the call backs  YOU WILL RECEIVE A CALL BACK THE SAME DAY AS LONG AS YOU CALL BEFORE 4:00 PM

## 2024-03-31 NOTE — Telephone Encounter (Signed)
 Yes please refill albuterol  - thanks

## 2024-03-31 NOTE — Progress Notes (Signed)
 ADVANCED HEART FAILURE CLINIC NOTE  Referring Physician: Senaida Dama, NP  Primary Care: Senaida Dama, NP Primary Cardiologist: Ahmad Alert, MD  HPI: Janet [name] is a very pleasant 58 y.o. female with T2DM, asthma, HTN, obesity, hx of substance abuse and HFrEF presenting today to establish care. Janet [name] reports being initially diagnosed with CHF in early 2022 after presenting to the hospital with shortness of breath and chest pressure.  She believes her CHF was brought on by use of crack/cocaine.  Since diagnosis to her LVEF has ranged from 25% to 40%.  While she has worked very hard to control her substance use, she believes that her drops and LV function have been due to unfortunate relapses.  Her last relapse was after the death of a close family member.  Diagnosed with breast cancer a few weeks ago. Treatment pending. Needs additional surgery with lymph node biosies. Plan for radiation. Had 5 top teeth removed last week.   Today she returns for HF follow up. Concerned about recent breast cancer diagnosis. Used inhaler today for wheezing. Denies PND/Orthopnea. Appetite ok. No fever or chills. Weight at home has been stable. She has not used cocaine in 6 months. Taking all medications.   Past Medical History:  Diagnosis Date   Allergy    Anxiety    Arthritis    Asthma    Breast cancer (HCC) 02/28/2024   CHF (congestive heart failure) (HCC)    Congestive heart failure (CHF) (HCC)    Depression    Elevated brain natriuretic peptide (BNP) level    Hypertension    Lymphedema    Nonischemic cardiomyopathy (HCC) 03/2021   SOB (shortness of breath)    Substance abuse (HCC)     Current Outpatient Medications  Medication Sig Dispense Refill   albuterol  (PROVENTIL ) (2.5 MG/3ML) 0.083% nebulizer solution Take 3 mLs (2.5 mg total) by nebulization every 4 (four) hours as needed for wheezing or shortness of breath. 75 mL 2   albuterol  (VENTOLIN  HFA) 108 (90 Base)  MCG/ACT inhaler Inhale 2 puffs into the lungs every 6 (six) hours as needed for wheezing or shortness of breath. 18 g 2   Budeson-Glycopyrrol-Formoterol  (BREZTRI  AEROSPHERE) 160-9-4.8 MCG/ACT AERO Inhale 2 puffs into the lungs in the morning and at bedtime. 10.7 g 5   carvedilol  (COREG ) 12.5 MG tablet Take 1 tablet (12.5 mg total) by mouth 2 (two) times daily with a meal. 60 tablet 11   DULoxetine (CYMBALTA) 30 MG capsule Take 30 mg by mouth 2 (two) times daily.     fluticasone  (FLONASE ) 50 MCG/ACT nasal spray Place 2 sprays into both nostrils daily. (Patient taking differently: Place 2 sprays into both nostrils daily as needed for allergies or rhinitis.) 48 g 0   furosemide  (LASIX ) 20 MG tablet TAKE ONE TABLET (20 MG) BY MOUTH DAILY AT 9AM (Patient taking differently: Take 20 mg by mouth daily as needed for fluid or edema.) 30 tablet 11   hydrOXYzine  (VISTARIL ) 50 MG capsule Take 100 mg by mouth at bedtime.     ibuprofen (ADVIL) 200 MG tablet Take 200 mg by mouth every 6 (six) hours as needed for fever, headache or mild pain (pain score 1-3).     montelukast  (SINGULAIR ) 10 MG tablet TAKE 1 TABLET(10 MG) BY MOUTH AT BEDTIME 90 tablet 3   sacubitril -valsartan  (ENTRESTO ) 49-51 MG TAKE ONE TABLET BY MOUTH TWICE DAILY @ 9AM-5PM 180 tablet 1   spironolactone  (ALDACTONE ) 25 MG tablet TAKE ONE TABLET (25  MG) BY MOUTH DAILY 90 tablet 0   traZODone  (DESYREL ) 150 MG tablet Take 150 mg by mouth at bedtime.     aspirin  81 MG chewable tablet Chew 81 mg by mouth daily. (Patient not taking: Reported on 03/31/2024)     No current facility-administered medications for this encounter.    Allergies  Allergen Reactions   Clarithromycin Other (See Comments)    "Biaxin"  GI upset  Other Reaction(s): Other (See Comments)  Upset stomach, Cramps   Penicillins Rash, Hives and Other (See Comments)   Penicillin G Rash      Social History   Socioeconomic History   Marital status: Single    Spouse name: Not on  file   Number of children: 2   Years of education: 14   Highest education level: Associate degree: academic program  Occupational History   Occupation: Consulting civil engineer   Occupation: disabled  Tobacco Use   Smoking status: Never    Passive exposure: Current   Smokeless tobacco: Never  Vaping Use   Vaping status: Never Used  Substance and Sexual Activity   Alcohol use: Yes    Alcohol/week: 3.0 standard drinks of alcohol    Types: 3 Shots of liquor per week    Comment: 1 pint per week   Drug use: Yes    Frequency: 1.0 times per week    Types: Marijuana    Comment: uses marijuana twice a week   Sexual activity: Not Currently    Birth control/protection: Surgical  Other Topics Concern   Not on file  Social History Narrative   Not on file   Social Drivers of Health   Financial Resource Strain: Low Risk  (01/03/2024)   Overall Financial Resource Strain (CARDIA)    Difficulty of Paying Living Expenses: Not very hard  Food Insecurity: Food Insecurity Present (03/13/2024)   Hunger Vital Sign    Worried About Running Out of Food in the Last Year: Never true    Ran Out of Food in the Last Year: Sometimes true  Transportation Needs: No Transportation Needs (03/13/2024)   PRAPARE - Administrator, Civil Service (Medical): No    Lack of Transportation (Non-Medical): No  Physical Activity: Inactive (01/03/2024)   Exercise Vital Sign    Days of Exercise per Week: 0 days    Minutes of Exercise per Session: 0 min  Stress: Stress Concern Present (01/03/2024)   [employer] of Occupational Health - Occupational Stress Questionnaire    Feeling of Stress : Very much  Social Connections: Socially Isolated (01/03/2024)   Social Connection and Isolation Panel [NHANES]    Frequency of Communication with Friends and Family: More than three times a week    Frequency of Social Gatherings with Friends and Family: Once a week    Attends Religious Services: Never    Database administrator or  Organizations: No    Attends Engineer, structural: Not on file    Marital Status: Never married  Intimate Partner Violence: Not At Risk (03/13/2024)   Humiliation, Afraid, Rape, and Kick questionnaire    Fear of Current or Ex-Partner: No    Emotionally Abused: No    Physically Abused: No    Sexually Abused: No      Family History  Problem Relation Age of Onset   Colon polyps Mother    Hypertension Mother    Stroke Mother    Deep vein thrombosis Mother    Congestive Heart Failure Mother  Arthritis Mother    Asthma Mother    Depression Mother    Other Father        history unknown   Obesity Sister    Diabetes Sister    Obesity Brother    Congestive Heart Failure Maternal Grandmother    Colon cancer Neg Hx    Esophageal cancer Neg Hx    Rectal cancer Neg Hx    Stomach cancer Neg Hx     PHYSICAL EXAM: Vitals:   03/31/24 1328  BP: 112/74  Pulse: 73  SpO2: 98%   Wt Readings from Last 3 Encounters:  03/31/24 123.8 kg (273 lb)  03/13/24 124.7 kg (275 lb)  03/11/24 122.9 kg (271 lb)    General:   No resp difficulty Neck: supple. no JVD.  Cor: PMI nondisplaced. Regular rate & rhythm. No rubs, gallops or murmurs. Lungs: EW throughout  Abdomen: soft, nontender, nondistended.  Extremities: no cyanosis, clubbing, rash, edema. Chronic lower extremity lymph edema  Neuro: alert & oriented x3  DATA REVIEW  ECG: Sinus rhythm with LVH  ECHO: 05/16/22: LVEF 40%, RV function normal.  09/22/21: LVEF 30-35%, RV normal. LA moderately dilated.  12/14/22: LVEF 35%; normal RV function.   CATH: 04/01/21:  LV end diastolic pressure is moderately elevated. There is no aortic valve stenosis. Hemodynamic findings consistent with mild pulmonary hypertension. Ao sat 99%, PA sat 73%, mean PA pressure 33 mmHg, mean PCWP 22 mmHg, cardiac output 6.2 L/min, cardiac index 2.6. No angiographically apparent coronary artery disease. Significant tortuosity noted in the RCA and the  circumflex.  CMR (08/07/22): 1. Moderate LVE/LVH with abnormal septal motion and global hypokinesis LVEF 23%  2. Mild non specific mid myocardial gadolinium uptake in anterior wall  3.  Low normal RVEF 45%  4.  Tri leaflet AV with mild AR Regurgitant fraction 19%  5.  Mild Ascending thoracic aorta/sinus dilatation 3.8 cm  ASSESSMENT & PLAN:  Stage C, HFrEF, Nonischemic CMP Etiology of ZO:[id number], likely related to subustance abuse LHC 2022  no coronary disease.  NYHA class / AHA Stage:  NYHA II Volume status & Diuretics:   Vasodilators:Entresto  49/51mg  BID Beta-Blocker: continue Coreg  to 12.5 mg twice daily MRA: Spironolactone  25mg  daily Cardiometabolic: Restart fraxiga 10 mg daily. New script sent in.  Devices therapies & Valvulopathies: 2024 LVEF 35%; will continue GDMT before pursuing ICD placement.  Advanced therapies:Not indicated Repeat Echo 04/07/24. Will need to review with Dr Bruce Caper.  I reviewed BMET from 03/11/24, stable.   2. HTN - Stable. Continue current regimen.   3. Obesity  -Body mass index is 41.51 kg/m. Discussed portion control   4. Substance abuse -She has not used cocaine in 6 months. She is in therapy now. Congrautlated.   5. L Breast cancer Pending Breast Cancer treatment.Needs additional biopsy. We are repeating Echo next week.   Follow up in 4 weeks with Dr Bruce Caper.   Nieves Bars NP-C  4:32 PM

## 2024-03-31 NOTE — Telephone Encounter (Signed)
 Albuterol HFA has been sent to preferred pharmacy. Pt is aware and voiced her understanding.   Nothing further needed.

## 2024-03-31 NOTE — Telephone Encounter (Signed)
 Duplicate request- signed 03/31/24 18g 2RF Requested Prescriptions  Pending Prescriptions Disp Refills   albuterol  (VENTOLIN  HFA) 108 (90 Base) MCG/ACT inhaler 8 g 0    Sig: Inhale 2 puffs into the lungs every 6 (six) hours as needed for wheezing or shortness of breath.     Pulmonology:  Beta Agonists 2 Passed - 03/31/2024 10:22 AM      Passed - Last BP in normal range    BP Readings from Last 1 Encounters:  03/11/24 107/71         Passed - Last Heart Rate in normal range    Pulse Readings from Last 1 Encounters:  03/13/24 73         Passed - Valid encounter within last 12 months    Recent Outpatient Visits           3 months ago Hospital discharge follow-up   [facility], Amy J, NP   5 months ago Moderate persistent asthma with acute exacerbation   [facility], MD   7 months ago Encounter for screening mammogram for malignant neoplasm of breast   [facility] [place], NP   1 year ago Annual physical exam   [facility], Amy J, NP   1 year ago Lipoma of right upper extremity    [facility] at [facility], Annalee Barren, NP       Future Appointments             In 4 days Cleaver, Chet Cota, NP [facility] [employer]. [facility] [employer], [employer]

## 2024-03-31 NOTE — Telephone Encounter (Signed)
 Copied from CRM [id number]. Topic: Clinical - Medication Refill >> Mar 27, 2024  2:02 PM Rosamond Comes wrote: Patient calling in requesting medication refill not on list   Medication: albuterol  rescue inhaler    Has the patient contacted their pharmacy? Yes   pharmacy stated patient needed to call primary care  This is the patient's preferred pharmacy:   [id number] - [place], [place] - [address] AT [facility] OF [address] [place] [address] Phone: [phone] Fax: [phone]    Is this the correct pharmacy for this prescription? Yes If no, delete pharmacy and type the correct one.   Has the prescription been filled recently? No  Is the patient out of the medication? Yes  Has the patient been seen for an appointment in the last year OR does the patient have an upcoming appointment? No  Can we respond through MyChart? Yes  Agent: Please be advised that Rx refills may take up to 3 business days. We ask that you follow-up with your pharmacy. >> Mar 27, 2024  2:40 PM Chantha C wrote: Patient sent a refill request to her pcp for Albuterol  sulfate HFA it's a rescue inhaler,  unsure of the dosage and name. Pcp office advised patient to contact her pulmonologist Dr. Marygrace Snellen. Patient states has 24 puffs left, patient use medication as needed. Patient had an upcoming surgery 04/08/24, has breast surgery. Patient does not want to be out of medication. Please send refill to [id number] - [place], [place] - [address] AT [facility] [address] [place] [address] Phone: [phone] Fax: [phone]   This was ordered today by Katie Parks, CMA and Dr Marygrace Snellen. NFN

## 2024-03-31 NOTE — Telephone Encounter (Signed)
 Noted.

## 2024-03-31 NOTE — Telephone Encounter (Signed)
 Patient established with [facility] at [facility] (recent encounter 03/28/2024). Request refills from the same.

## 2024-03-31 NOTE — Telephone Encounter (Signed)
 I called patient and made her aware that PCP recommends that Patient established with [facility] at [facility] (recent encounter 03/28/2024). Request refills from the same.

## 2024-04-01 NOTE — Progress Notes (Signed)
 Patient has heart failure with EF 30-35%, per Day Surgery Guidelines, she will need to be done at Main OR. Tonya at Dr Cornett's office made aware.

## 2024-04-02 ENCOUNTER — Ambulatory Visit: Admitting: Adult Health

## 2024-04-02 NOTE — Progress Notes (Unsigned)
 Cardiology Clinic Note   Patient Name: Janet [name] Date of Encounter: 04/04/2024  Primary Care Provider:  Senaida Dama, NP Primary Cardiologist:  Ahmad Alert, MD  Patient Profile    Janet [name] 58 year old female presents to the clinic today for follow-up evaluation of her acute systolic CHF, preoperative cardiac evaluation, and hypertension.  Past Medical History    Past Medical History:  Diagnosis Date   Allergy    Anxiety    Arthritis    Asthma    Breast cancer (HCC) 02/28/2024   CHF (congestive heart failure) (HCC)    Congestive heart failure (CHF) (HCC)    Depression    Elevated brain natriuretic peptide (BNP) level    Hypertension    Lymphedema    Nonischemic cardiomyopathy (HCC) 03/2021   SOB (shortness of breath)    Substance abuse (HCC)    Past Surgical History:  Procedure Laterality Date   ABDOMINAL HYSTERECTOMY     BREAST BIOPSY Right    BREAST BIOPSY Left 10/16/2023   times 3   BREAST BIOPSY Left 10/16/2023   US  LT BREAST BX W LOC DEV EA ADD LESION IMG BX SPEC US  GUIDE 10/16/2023 [facility]   BREAST BIOPSY Left 10/16/2023   US  LT BREAST BX W LOC DEV 1ST LESION IMG BX SPEC US  GUIDE 10/16/2023 [facility]   BREAST BIOPSY Left 10/16/2023   US  LT BREAST BX W LOC DEV EA ADD LESION IMG BX SPEC US  GUIDE 10/16/2023 [facility]   BREAST BIOPSY Left 02/27/2024   US  LT RADIOACTIVE SEED LOC 02/27/2024 [facility]   BREAST BIOPSY Left 02/27/2024   US  LT RADIOACTIVE SEED EA ADD LESION 02/27/2024 [facility]   BREAST LUMPECTOMY WITH RADIOACTIVE SEED LOCALIZATION Left 02/28/2024   Procedure: LEFT BREAST SEED LUMPECTOMY;  Surgeon: Sim Dryer, MD;  Location: [facility];  Service: General;  Laterality: Left;  3 seeds   CESAREAN SECTION     COLONOSCOPY WITH PROPOFOL  N/A 12/10/2023   Procedure: COLONOSCOPY WITH PROPOFOL ;  Surgeon: Ace Holder, MD;  Location: [facility];  Service: Gastroenterology;  Laterality:  N/A;   HEMOSTASIS CLIP PLACEMENT  12/10/2023   Procedure: HEMOSTASIS CLIP PLACEMENT;  Surgeon: Ace Holder, MD;  Location: [facility];  Service: Gastroenterology;;   HERNIA REPAIR     umbilical as a child   POLYPECTOMY  12/10/2023   Procedure: POLYPECTOMY;  Surgeon: Ace Holder, MD;  Location: [facility];  Service: Gastroenterology;;   RIGHT/LEFT HEART CATH AND CORONARY ANGIOGRAPHY N/A 04/01/2021   Procedure: RIGHT/LEFT HEART CATH AND CORONARY ANGIOGRAPHY;  Surgeon: Lucendia Rusk, MD;  Location: [facility] INVASIVE CV LAB;  Service: Cardiovascular;  Laterality: N/A;   TUBAL LIGATION      Allergies  Allergies  Allergen Reactions   Clarithromycin Other (See Comments)    "Biaxin"  GI upset  Other Reaction(s): Other (See Comments)  Upset stomach, Cramps   Penicillins Rash, Hives and Other (See Comments)   Penicillin G Rash    History of Present Illness    Janet [name] has a PMH of chronic systolic CHF, HTN, nonischemic cardiomyopathy, polysubstance abuse, obesity, type 2 diabetes, and malignant neoplasm of left breast.  She was diagnosed with CHF in early 2022.  She had presented to the hospital with shortness of breath and chest pressure.  It was felt that her CHF was brought on by crack/cocaine use.  Echocardiogram 12/14/2022 showed an LVEF of 30-35% and G1 DD.  She was seen in  follow-up by Nieves Bars, NP on 03/31/2024.  During that time she continued to try to stay away from substance use.  She reported that she felt her LV function with dropped when she had relapses.  She noted that her last relapse was after the death of a close family member.  She reported that she had 5 teeth removed the week before her visit.  She had also been diagnosed with breast cancer a few weeks prior.  She was pending lumpectomy and lymph node biopsies.  She reported that she was concerned about her recent breast cancer diagnosis.  She had used her inhaler for respiratory wheezing.   She denied orthopnea and PND.  Her appetite was okay.  She denied fever and chills.  Her weight at home and been stable.  She denied cocaine use in 6 months.  She reports compliance with her medications.  She presents to the clinic today for follow-up evaluation and states she is doing fairly well.  We reviewed her recent heart failure clinic visit.  She expressed understanding.  She notes that she has upcoming surgery related to breast cancer.  She has noted that she has had some increased shortness of breath which appears to be related to environmental factors.  I encouraged her to increase her physical activity as tolerated.  She does have generalized bilateral lower extremity nonpitting edema.  She attributes this to lymphedema.  I will give her the [place] support stockings sheet, continue her current medication regimen, have her increase her physical activity as tolerated and plan follow-up in 9-12 months.  Today she denies chest pain, increased lower extremity edema, fatigue, palpitations, melena, hematuria, hemoptysis, diaphoresis, weakness, presyncope, syncope, orthopnea, and PND.    Home Medications    Prior to Admission medications   Medication Sig Start Date End Date Taking? Authorizing Provider  albuterol  (PROVENTIL ) (2.5 MG/3ML) 0.083% nebulizer solution Take 3 mLs (2.5 mg total) by nebulization every 4 (four) hours as needed for wheezing or shortness of breath. Patient taking differently: Take 2.5 mg by nebulization daily. 01/26/24   Ward, Char Common, PA-C  albuterol  (VENTOLIN  HFA) 108 (90 Base) MCG/ACT inhaler Inhale 2 puffs into the lungs every 6 (six) hours as needed for wheezing or shortness of breath. 03/31/24   Hunsucker, Archer Kobs, MD  Budeson-Glycopyrrol-Formoterol  (BREZTRI  AEROSPHERE) 160-9-4.8 MCG/ACT AERO Inhale 2 puffs into the lungs in the morning and at bedtime. 12/17/23   Senaida Dama, NP  carvedilol  (COREG ) 12.5 MG tablet Take 1 tablet (12.5 mg total) by mouth 2 (two)  times daily with a meal. 11/15/23   Sabharwal, Aditya, DO  chlorhexidine  (PERIDEX ) 0.12 % solution Use as directed 15 mLs in the mouth or throat daily. 04/01/24   [provider]  clindamycin  (CLEOCIN ) 300 MG capsule Take 300 mg by mouth every 8 (eight) hours. 03/25/24   [provider]  dapagliflozin  propanediol (FARXIGA ) 10 MG TABS tablet Take 1 tablet (10 mg total) by mouth daily before breakfast. 03/31/24   Clegg, Amy D, NP  DULoxetine (CYMBALTA) 30 MG capsule Take 30 mg by mouth daily. 02/06/24   [provider]  fluticasone  (FLONASE ) 50 MCG/ACT nasal spray Place 2 sprays into both nostrils daily. Patient taking differently: Place 2 sprays into both nostrils daily as needed for allergies or rhinitis. 01/18/23   Senaida Dama, NP  furosemide  (LASIX ) 20 MG tablet TAKE ONE TABLET (20 MG) BY MOUTH DAILY AT 9AM Patient taking differently: Take 20 mg by mouth daily. 11/15/23  Sabharwal, Aditya, DO  hydrOXYzine  (VISTARIL ) 100 MG capsule Take 100 mg by mouth at bedtime. 01/02/23   [provider]  ibuprofen (ADVIL) 800 MG tablet Take 800 mg by mouth every 6 (six) hours as needed for fever, headache or mild pain (pain score 1-3).    [provider]  montelukast  (SINGULAIR ) 10 MG tablet TAKE 1 TABLET(10 MG) BY MOUTH AT BEDTIME 03/30/23   Senaida Dama, NP  Multiple Vitamin (MULTIVITAMIN) tablet Take 1 tablet by mouth daily.    [provider]  sacubitril -valsartan  (ENTRESTO ) 49-51 MG TAKE ONE TABLET BY MOUTH TWICE DAILY @ 9AM-5PM 01/29/24   Sabharwal, Aditya, DO  spironolactone  (ALDACTONE ) 25 MG tablet TAKE ONE TABLET (25 MG) BY MOUTH DAILY 03/14/24   Sabharwal, Aditya, DO  traZODone  (DESYREL ) 150 MG tablet Take 150 mg by mouth at bedtime.    [provider]    Family History    Family History  Problem Relation Age of Onset   Colon polyps Mother    Hypertension Mother    Stroke Mother    Deep vein thrombosis Mother    Congestive Heart  Failure Mother    Arthritis Mother    Asthma Mother    Depression Mother    Other Father        history unknown   Obesity Sister    Diabetes Sister    Obesity Brother    Congestive Heart Failure Maternal Grandmother    Colon cancer Neg Hx    Esophageal cancer Neg Hx    Rectal cancer Neg Hx    Stomach cancer Neg Hx    She indicated that her mother is alive. She indicated that the status of her father is unknown. She indicated that the status of her sister is unknown. She indicated that the status of her brother is unknown. She indicated that the status of her maternal grandmother is unknown. She indicated that the status of her neg hx is unknown.  Social History    Social History   Socioeconomic History   Marital status: Single    Spouse name: Not on file   Number of children: 2   Years of education: 14   Highest education level: Associate degree: academic program  Occupational History   Occupation: Consulting civil engineer   Occupation: disabled  Tobacco Use   Smoking status: Never    Passive exposure: Current   Smokeless tobacco: Never  Vaping Use   Vaping status: Never Used  Substance and Sexual Activity   Alcohol use: Yes    Alcohol/week: 3.0 standard drinks of alcohol    Types: 3 Shots of liquor per week    Comment: 1 pint per week   Drug use: Yes    Frequency: 1.0 times per week    Types: Marijuana    Comment: uses marijuana twice a week   Sexual activity: Not Currently    Birth control/protection: Surgical  Other Topics Concern   Not on file  Social History Narrative   Not on file   Social Drivers of Health   Financial Resource Strain: Low Risk  (01/03/2024)   Overall Financial Resource Strain (CARDIA)    Difficulty of Paying Living Expenses: Not very hard  Food Insecurity: Food Insecurity Present (03/13/2024)   Hunger Vital Sign    Worried About Running Out of Food in the Last Year: Never true    Ran Out of Food in the Last Year: Sometimes true  Transportation Needs:  No Transportation Needs (03/13/2024)  PRAPARE - Administrator, Civil Service (Medical): No    Lack of Transportation (Non-Medical): No  Physical Activity: Inactive (01/03/2024)   Exercise Vital Sign    Days of Exercise per Week: 0 days    Minutes of Exercise per Session: 0 min  Stress: Stress Concern Present (01/03/2024)   [employer] of Occupational Health - Occupational Stress Questionnaire    Feeling of Stress : Very much  Social Connections: Socially Isolated (01/03/2024)   Social Connection and Isolation Panel [NHANES]    Frequency of Communication with Friends and Family: More than three times a week    Frequency of Social Gatherings with Friends and Family: Once a week    Attends Religious Services: Never    Database administrator or Organizations: No    Attends Engineer, structural: Not on file    Marital Status: Never married  Intimate Partner Violence: Not At Risk (03/13/2024)   Humiliation, Afraid, Rape, and Kick questionnaire    Fear of Current or Ex-Partner: No    Emotionally Abused: No    Physically Abused: No    Sexually Abused: No     Review of Systems    General:  No chills, fever, night sweats or weight changes.  Cardiovascular:  No chest pain, dyspnea on exertion, edema, orthopnea, palpitations, paroxysmal nocturnal dyspnea. Dermatological: No rash, lesions/masses Respiratory: No cough, dyspnea Urologic: No hematuria, dysuria Abdominal:   No nausea, vomiting, diarrhea, bright red blood per rectum, melena, or hematemesis Neurologic:  No visual changes, wkns, changes in mental status. All other systems reviewed and are otherwise negative except as noted above.  Physical Exam    VS:  BP 102/72 (BP Location: Left Arm, Patient Position: Sitting, Cuff Size: Normal)   Pulse 70   Ht 5\' 8"  (1.727 m)   Wt 266 lb 3.2 oz (120.7 kg)   SpO2 94%   BMI 40.48 kg/m  , BMI Body mass index is 40.48 kg/m. GEN: Well nourished, well developed, in no  acute distress. HEENT: normal. Neck: Supple, no JVD, carotid bruits, or masses. Cardiac: RRR, no murmurs, rubs, or gallops. No clubbing, cyanosis, edema.  Radials/DP/PT 2+ and equal bilaterally.  Respiratory:  Respirations regular and unlabored, clear to auscultation bilaterally. GI: Soft, nontender, nondistended, BS + x 4. MS: no deformity or atrophy. Skin: warm and dry, no rash. Neuro:  Strength and sensation are intact. Psych: Normal affect.  Accessory Clinical Findings    Recent Labs: 10/06/2023: B Natriuretic Peptide 50.8 03/11/2024: ALT 10; BUN 14; Creatinine 1.15; Hemoglobin 13.7; Platelets 218; Potassium 4.3; Sodium 142   Recent Lipid Panel    Component Value Date/Time   CHOL 178 01/22/2023 1017   TRIG 86 01/22/2023 1017   HDL 90 01/22/2023 1017   CHOLHDL 2.0 01/22/2023 1017   LDLCALC 73 01/22/2023 1017         ECG personally reviewed by me today- EKG Interpretation Date/Time:  Friday Apr 04 2024 08:25:00 EDT Ventricular Rate:  70 PR Interval:  154 QRS Duration:  102 QT Interval:  434 QTC Calculation: 468 R Axis:   -37  Text Interpretation: Normal sinus rhythm Left axis deviation Incomplete right bundle branch block Confirmed by Lawana Pray [phone]) on 04/04/2024 8:29:54 AM   Echocardiogram 12/14/2022   IMPRESSIONS     1. Left ventricular ejection fraction, by estimation, is 30 to 35%. The  left ventricle has moderately decreased function. The left ventricle  demonstrates global hypokinesis. The left ventricular internal cavity size  was moderately dilated. Left  ventricular diastolic parameters are consistent with Grade I diastolic  dysfunction (impaired relaxation).   2. Right ventricular systolic function is normal. The right ventricular  size is normal.   3. The mitral valve is normal in structure. No evidence of mitral valve  regurgitation. No evidence of mitral stenosis.   4. The aortic valve is tricuspid. Aortic valve regurgitation is mild. No   aortic stenosis is present.   5. Aortic dilatation noted. There is mild dilatation of the ascending  aorta, measuring 40 mm.   6. The inferior vena cava is normal in size with greater than 50%  respiratory variability, suggesting right atrial pressure of 3 mmHg.   FINDINGS   Left Ventricle: Left ventricular ejection fraction, by estimation, is 30  to 35%. The left ventricle has moderately decreased function. The left  ventricle demonstrates global hypokinesis. The left ventricular internal  cavity size was moderately dilated.  There is no left ventricular hypertrophy. Left ventricular diastolic  parameters are consistent with Grade I diastolic dysfunction (impaired  relaxation).   Right Ventricle: The right ventricular size is normal. No increase in  right ventricular wall thickness. Right ventricular systolic function is  normal.   Left Atrium: Left atrial size was normal in size.   Right Atrium: Right atrial size was normal in size.   Pericardium: There is no evidence of pericardial effusion.   Mitral Valve: The mitral valve is normal in structure. No evidence of  mitral valve regurgitation. No evidence of mitral valve stenosis.   Tricuspid Valve: The tricuspid valve is normal in structure. Tricuspid  valve regurgitation is not demonstrated. No evidence of tricuspid  stenosis.   Aortic Valve: The aortic valve is tricuspid. Aortic valve regurgitation is  mild. Aortic regurgitation PHT measures 884 msec. No aortic stenosis is  present.   Pulmonic Valve: The pulmonic valve was normal in structure. Pulmonic valve  regurgitation is not visualized. No evidence of pulmonic stenosis.   Aorta: The aortic root is normal in size and structure and aortic  dilatation noted. There is mild dilatation of the ascending aorta,  measuring 40 mm.   Venous: The inferior vena cava is normal in size with greater than 50%  respiratory variability, suggesting right atrial pressure of 3 mmHg.    IAS/Shunts: No atrial level shunt detected by color flow Doppler.   Cardiac catheterization 04/01/2021  LV end diastolic pressure is moderately elevated. There is no aortic valve stenosis. Hemodynamic findings consistent with mild pulmonary hypertension. Ao sat 99%, PA sat 73%, mean PA pressure 33 mmHg, mean PCWP 22 mmHg, cardiac output 6.2 L/min, cardiac index 2.6. No angiographically apparent coronary artery disease. Significant tortuosity noted in the RCA and the circumflex.  Assessment & Plan   1.  Essential hypertension-BP today 102/72. Maintain blood pressure log Heart healthy low-sodium diet Continue spironolactone , Entresto , carvedilol   Chronic systolic CHF-no increased DOE.  Weight stable.  Echocardiogram 12/14/2022 showed LVEF of 30-35% and G1 DD.  Reports compliance with her medications.  Denies side effects.  She feels that her reduced EF is related to cocaine use.  She continues to be abstinent from substance use. Heart healthy low-sodium diet Continue daily weights and weight log Continue Entresto , spironolactone , furosemide , carvedilol , Farxiga  Elevate lower extremities when not active Lower extremity support stockings  Morbid obesity-weight today 266 lbs. Heart healthy low-sodium diet Increase physical activity as tolerated Continue weight loss  Prediabetes-glucose 96 on 03/11/2024. Carb modified diet Continue Farxiga  Continue weight loss Follows  with PCP   Preoperative cardiac evaluation-breast lumpectomy with radioactive seed and sentinel node biopsy, 04/08/2024, Dr. Sim Dryer    Primary Cardiologist: Ahmad Alert, MD  Chart reviewed as part of pre-operative protocol coverage. Given past medical history and time since last visit, based on ACC/AHA guidelines, ROAN ALLEGRA would be at acceptable risk for the planned procedure without further cardiovascular testing.   Her RCRI is low risk, 0.9% risk of major cardiac event.  She is able to  complete greater than 4 METS of physical activity.  Patient was advised that if she develops new symptoms prior to surgery to contact our office to arrange a follow-up appointment.  He verbalized understanding.  She is currently not taking any anticoagulant or antiplatelet therapy.  I will route this recommendation to the requesting party via Epic fax function and remove from pre-op pool.    Disposition: Follow-up with Dr. Alroy Aspen or me in 9-12 months.   Chet Cota. Ashiah Karpowicz NP-C     04/04/2024, 8:48 AM [facility] [address] Office [phone] Fax [phone]    I spent 14 minutes examining this patient, reviewing medications, and using patient centered shared decision making involving their cardiac care.   I spent  20 minutes reviewing past medical history,  medications, and prior cardiac tests.

## 2024-04-04 ENCOUNTER — Encounter: Payer: Self-pay | Admitting: General Practice

## 2024-04-04 ENCOUNTER — Ambulatory Visit: Attending: General Practice | Admitting: General Practice

## 2024-04-04 VITALS — BP 102/72 | HR 70 | Ht 68.0 in | Wt 266.2 lb

## 2024-04-04 DIAGNOSIS — I10 Essential (primary) hypertension: Secondary | ICD-10-CM | POA: Diagnosis not present

## 2024-04-04 DIAGNOSIS — E669 Obesity, unspecified: Secondary | ICD-10-CM | POA: Diagnosis not present

## 2024-04-04 DIAGNOSIS — I5022 Chronic systolic (congestive) heart failure: Secondary | ICD-10-CM

## 2024-04-04 DIAGNOSIS — Z01818 Encounter for other preprocedural examination: Secondary | ICD-10-CM

## 2024-04-04 DIAGNOSIS — R7303 Prediabetes: Secondary | ICD-10-CM | POA: Diagnosis not present

## 2024-04-04 NOTE — Patient Instructions (Signed)
 Medication Instructions:  The current medical regimen is effective;  continue present plan and medications as directed. Please refer to the Current Medication list given to you today.  *If you need a refill on your cardiac medications before your next appointment, please call your pharmacy*  Lab Work: NONE  Other Instructions MAY USE OVER-THE-COUNTER ALLEGRA -OR- ZYRTEC INCREASE PHYSICAL ACTIVITY-AS TOLERATED OK FOR LUMPECTOMY  Follow-Up: At [facility], you and your health needs are our priority.  As part of our continuing mission to provide you with exceptional heart care, our providers are all part of one team.  This team includes your primary Cardiologist (physician) and Advanced Practice Providers or APPs (Physician Assistants and Nurse Practitioners) who all work together to provide you with the care you need, when you need it.  Your next appointment:   9-12 month(s)  Provider:   Ahmad Alert, MD or Lawana Pray, NP            [employer]  Outlet Store  Performance Legwear for [facility] Mailing Address:  [address];   [address]  [place], [place] [address]  Tel [phone] Fx [phone]     High Quality Legwear for Today's Active Lifestyles Maximum Compression at the ankle. Compression lessens gradually up the leg.   We manufacture a wide range of compression hosiery for men and women in  different styles, constructions and levels of support.  How Compression Hosiery Works Regulatory affairs officer, [employer]. compression hosiery works by applying graduated pressure to the  muscles and veins in the legs.  When the calf muscle contracts such as during walking  the compression hosiery will "give" and then return to its original position. By doing so  the hosiery is assists your body's circulatory wellness.  The result is increased leg health and vitality.   Maximum Compression at the ankle Compression lessens gradually up the leg  We  Offer: Sheer & Opaque Stockings       COLORS:  Nude, black, white and misc. prints Below Knee Thigh High Pantyhose  High Quality Legwear for Today's Active Lifestyles We manufacture a wide range of compression hosiery for men and women in different styles, construction sand levels of support.  Socks:                     Sheer & Opaque   Compression Levels Include:                                  Stockings Men's               Below Knee                8-15 mmHg   Women's         Thigh High                 15-20 mmHg  Unisex             Pantyhose                 20-30 mmHg  30-40 mmHg  4 Simple Ways to Order   Email  [email or web address] Mail/Email orders are subject to processing and handling charges. Allow 7-10 days for receipt.  Phone [phone]  Please allow 24 hours for return call.   In Person  We recommend calling prior to your visit to confirm store hours as they may change due to holiday, weather, and maintenance.   By Mail When placing an order, please have the following information available. Our representatives are available to assist.     Measurements    THIGH      in.   CALF        in.   ANKLE     in.    Compression  8-15 mmHg >>>15-20 mmHg**   20-30 mmHg 30-40 mmHg   WOMEN'S MEN'S  Shoe Size Sock Size Shoe Size Sock Size  4 - 5 Small 7.5 and Under Small  5.5 - 7.5 Medium 8 - 10 Medium  8 - 10 Large 10.5 - 12 Large  10.5 and Over X-Large 12.5 and Over X-Large   Knee High Size Chart  Length from CALF MEASUREMENT  floor to bend   in knee. 11" 12" 13" 14" 15" 16" 17" 18" 19" 20" 21" 22"  14" S S S S M M L L L XL XL XL  15" S S S M M L L L XL XL XXL XXL  16" S S M M M L L L XL XXL XXL XXL  17" S M M M M L L XL XL XXL XXL XXL  18" M M M M L L L XL XL XXL XXL XXL  19" M M M M L L XL XL XL XXL XXL XXL   Thigh High Circumference Sizing Chart                 S M L XL XXL  ANKLE 6.5"  - 8" 8" - 9.5" 9.5" - 11" 11" - 12.5" 12.5" - 14"  CALF 10.5" - 14.5" 11.5" - 15.5" 12.5" - 17" 13.5" - 17.5" 14.5" - 19.5"  THIGH 15.5" - 22" 17.5" - 24" 19.5" - 26" 22" - 28" 26" - 32"  HIP UP TO 40" UP TO 44" UP TO 48' UP TO 52" UP TO 56"   Pantyhose Size Chart  Height Petite Medium Tall X-Tall [place] +   Weight Weight Weight Weight Weight Weight  4'11" 95-130 135      5'0 95-125 130-145   170-185   5'1" 90-120 125-155 160-165  170-195   5'2" 90-115 120-145 150-165  170-195   5'3" 90-110 115-140 145-165  170-200 200-225  5'4" 100-105 110-135 140-160 165 170-200 200-225  5'5" 100 105-130 135-160 165 170-200 200-225  5'6"  110-125 130-155 160-165 170-200 200-225  5'7"  110-120 125-150 155-165 170-200 195-225  5'8"   120-145 150-165 170-200 190-225  5'9"   125-140 145-170 175-190 185-220  5'10"   125-135 140-185  185-215  5'11"   130-135 140-185  190-210

## 2024-04-04 NOTE — Pre-Procedure Instructions (Signed)
 Surgical Instructions   Your procedure is scheduled on Apr 08, 2024. Report to [facility] Main Entrance "A" at 10:50 A.M., then check in with the Admitting office. Any questions or running late day of surgery: call [phone]  Questions prior to your surgery date: call [phone], Monday-Friday, 8am-4pm. If you experience any cold or flu symptoms such as cough, fever, chills, shortness of breath, etc. between now and your scheduled surgery, please notify us  at the above number.     Remember:  Do not eat after midnight the night before your surgery   You may drink clear liquids until 9:50 AM the morning of your surgery.   Clear liquids allowed are: Water, Non-Citrus Juices (without pulp), Carbonated Beverages, Clear Tea (no milk, honey, etc.), Black Coffee Only (NO MILK, CREAM OR POWDERED CREAMER of any kind), and Gatorade.    Take these medicines the morning of surgery with A SIP OF WATER: Budeson-Glycopyrrol-Formoterol  (BREZTRI  AEROSPHERE)  carvedilol  (COREG )  clindamycin  (CLEOCIN )  DULoxetine (CYMBALTA)    May take these medicines IF NEEDED: albuterol  (PROVENTIL ) nebulizer solution  albuterol  (VENTOLIN  HFA) inhaler - please bring inhaler with you morning of surgery fluticasone  (FLONASE ) nasal spray    STOP taking your dapagliflozin  propanediol (FARXIGA ) three days prior to surgery. Your last dose will be May 16th.   One week prior to surgery, STOP taking any Aspirin  (unless otherwise instructed by your surgeon) Aleve, Naproxen, Ibuprofen, Motrin, Advil, Goody's, BC's, all herbal medications, fish oil, and non-prescription vitamins.                     Do NOT Smoke (Tobacco/Vaping) for 24 hours prior to your procedure.  If you use a CPAP at night, you may bring your mask/headgear for your overnight stay.   You will be asked to remove any contacts, glasses, piercing's, hearing aid's, dentures/partials prior to surgery. Please bring cases for these items if needed.     Patients discharged the day of surgery will not be allowed to drive home, and someone needs to stay with them for 24 hours.  SURGICAL WAITING ROOM VISITATION Patients may have no more than 2 support people in the waiting area - these visitors may rotate.   Pre-op nurse will coordinate an appropriate time for 1 ADULT support person, who may not rotate, to accompany patient in pre-op.  Children under the age of 18 must have an adult with them who is not the patient and must remain in the main waiting area with an adult.  If the patient needs to stay at the hospital during part of their recovery, the visitor guidelines for inpatient rooms apply.  Please refer to the [facility] website for the visitor guidelines for any additional information.   If you received a COVID test during your pre-op visit  it is requested that you wear a mask when out in public, stay away from anyone that may not be feeling well and notify your surgeon if you develop symptoms. If you have been in contact with anyone that has tested positive in the last 10 days please notify you surgeon.      Pre-operative CHG Bathing Instructions   You can play a key role in reducing the risk of infection after surgery. Your skin needs to be as free of germs as possible. You can reduce the number of germs on your skin by washing with CHG (chlorhexidine  gluconate) soap before surgery. CHG is an antiseptic soap that kills germs and continues to kill  germs even after washing.   DO NOT use if you have an allergy to chlorhexidine /CHG or antibacterial soaps. If your skin becomes reddened or irritated, stop using the CHG and notify one of our RNs at [phone].              TAKE A SHOWER THE NIGHT BEFORE SURGERY AND THE DAY OF SURGERY    Please keep in mind the following:  DO NOT shave, including legs and underarms, 48 hours prior to surgery.   You may shave your face before/day of surgery.  Place clean sheets on your bed the night  before surgery Use a clean washcloth (not used since being washed) for each shower. DO NOT sleep with pet's night before surgery.  CHG Shower Instructions:  Wash your face and private area with normal soap. If you choose to wash your hair, wash first with your normal shampoo.  After you use shampoo/soap, rinse your hair and body thoroughly to remove shampoo/soap residue.  Turn the water OFF and apply half the bottle of CHG soap to a CLEAN washcloth.  Apply CHG soap ONLY FROM YOUR NECK DOWN TO YOUR TOES (washing for 3-5 minutes)  DO NOT use CHG soap on face, private areas, open wounds, or sores.  Pay special attention to the area where your surgery is being performed.  If you are having back surgery, having someone wash your back for you may be helpful. Wait 2 minutes after CHG soap is applied, then you may rinse off the CHG soap.  Pat dry with a clean towel  Put on clean pajamas    Additional instructions for the day of surgery: DO NOT APPLY any lotions, deodorants, cologne, or perfumes.   Do not wear jewelry or makeup Do not wear nail polish, gel polish, artificial nails, or any other type of covering on natural nails (fingers and toes) Do not bring valuables to the hospital. [facility] is not responsible for valuables/personal belongings. Put on clean/comfortable clothes.  Please brush your teeth.  Ask your nurse before applying any prescription medications to the skin.

## 2024-04-07 ENCOUNTER — Encounter (HOSPITAL_COMMUNITY)
Admission: RE | Admit: 2024-04-07 | Discharge: 2024-04-07 | Disposition: A | Discharge status: Home / Self Care | Source: Ambulatory Visit | Attending: Surgery | Admitting: Surgery

## 2024-04-07 ENCOUNTER — Other Ambulatory Visit: Payer: Self-pay

## 2024-04-07 ENCOUNTER — Ambulatory Visit (HOSPITAL_COMMUNITY)
Admission: RE | Admit: 2024-04-07 | Discharge: 2024-04-07 | Disposition: A | Discharge status: Home / Self Care | Source: Ambulatory Visit | Attending: Hematology and Oncology | Admitting: Hematology and Oncology

## 2024-04-07 ENCOUNTER — Encounter (HOSPITAL_COMMUNITY): Payer: Self-pay

## 2024-04-07 DIAGNOSIS — I080 Rheumatic disorders of both mitral and aortic valves: Secondary | ICD-10-CM | POA: Diagnosis not present

## 2024-04-07 DIAGNOSIS — C50212 Malignant neoplasm of upper-inner quadrant of left female breast: Secondary | ICD-10-CM

## 2024-04-07 DIAGNOSIS — R0602 Shortness of breath: Secondary | ICD-10-CM | POA: Diagnosis not present

## 2024-04-07 DIAGNOSIS — I447 Left bundle-branch block, unspecified: Secondary | ICD-10-CM | POA: Diagnosis not present

## 2024-04-07 DIAGNOSIS — Z170 Estrogen receptor positive status [ER+]: Secondary | ICD-10-CM | POA: Diagnosis not present

## 2024-04-07 DIAGNOSIS — Z01818 Encounter for other preprocedural examination: Secondary | ICD-10-CM | POA: Insufficient documentation

## 2024-04-07 DIAGNOSIS — I509 Heart failure, unspecified: Secondary | ICD-10-CM | POA: Insufficient documentation

## 2024-04-07 DIAGNOSIS — Z0189 Encounter for other specified special examinations: Secondary | ICD-10-CM

## 2024-04-07 HISTORY — DX: Prediabetes: R73.03

## 2024-04-07 LAB — ECHOCARDIOGRAM COMPLETE
AR max vel: 2.94 cm2
AV Area VTI: 2.66 cm2
AV Area mean vel: 2.72 cm2
AV Mean grad: 5 mmHg
AV Peak grad: 8.4 mmHg
Ao pk vel: 1.45 m/s
Area-P 1/2: 2.99 cm2
Calc EF: 32.4 %
Height: 68 in
P 1/2 time: 845 ms
S' Lateral: 5.2 cm
Single Plane A2C EF: 33.9 %
Single Plane A4C EF: 35.1 %
Weight: 4363.2 [oz_av]

## 2024-04-07 NOTE — Progress Notes (Signed)
  Echocardiogram 2D Echocardiogram has been performed.  Janet [name] 04/07/2024, 12:00 PM

## 2024-04-07 NOTE — Progress Notes (Signed)
 PCP - Lavona Pounds, NP Cardiologist - Dr Ahmad Alert - last office visit 04/04/2024 Heart Failure - Alwin Baars - Last office visit 03/31/2024  PPM/ICD - Denies Device Orders - n/a Rep Notified - n/a  Chest x-ray - 01/26/2024 EKG - 04/04/2024 Stress Test - 08/07/2022 ECHO - 12/14/2022 - Pt is scheduled for echo today, May 19th, at 1100. Cardiac Cath - 04/01/2021  Sleep Study - Denies - STOPBANG score 6. PCP notified  Pt is Pre-DM  Last dose of GLP1 agonist- n/a GLP1 instructions: n/a  Blood Thinner Instructions: n/a Aspirin  Instructions: n/a  ERAS Protcol - Clear liquids until 0950 morning of surgery PRE-SURGERY Ensure or G2- n/a  COVID TEST- n/a   Anesthesia review: Yes. Cardiac clearance. Pt also having echo completed today, May 19th at 1100. Pts last dose of Farxiga  will be today as well.   Patient denies shortness of breath, fever, cough and chest pain at PAT appointment. Pt denies any respiratory illness/infection in the last two months.    All instructions explained to the patient, with a verbal understanding of the material. Patient agrees to go over the instructions while at home for a better understanding. Patient also instructed to self quarantine after being tested for COVID-19. The opportunity to ask questions was provided.

## 2024-04-08 ENCOUNTER — Other Ambulatory Visit: Payer: Self-pay

## 2024-04-08 ENCOUNTER — Ambulatory Visit (HOSPITAL_COMMUNITY): Admitting: Anesthesiology

## 2024-04-08 ENCOUNTER — Encounter (HOSPITAL_COMMUNITY)
Admission: RE | Disposition: A | Discharge status: Home / Self Care | Payer: Self-pay | Source: Home / Self Care | Attending: Surgery

## 2024-04-08 ENCOUNTER — Ambulatory Visit (HOSPITAL_BASED_OUTPATIENT_CLINIC_OR_DEPARTMENT_OTHER): Admitting: Anesthesiology

## 2024-04-08 ENCOUNTER — Encounter (HOSPITAL_COMMUNITY): Payer: Self-pay | Admitting: Surgery

## 2024-04-08 ENCOUNTER — Ambulatory Visit (HOSPITAL_COMMUNITY)
Admission: RE | Admit: 2024-04-08 | Discharge: 2024-04-08 | Disposition: A | Discharge status: Home / Self Care | Attending: Surgery | Admitting: Surgery

## 2024-04-08 DIAGNOSIS — J45909 Unspecified asthma, uncomplicated: Secondary | ICD-10-CM | POA: Diagnosis not present

## 2024-04-08 DIAGNOSIS — C773 Secondary and unspecified malignant neoplasm of axilla and upper limb lymph nodes: Secondary | ICD-10-CM | POA: Diagnosis not present

## 2024-04-08 DIAGNOSIS — N6321 Unspecified lump in the left breast, upper outer quadrant: Secondary | ICD-10-CM | POA: Diagnosis present

## 2024-04-08 DIAGNOSIS — F1290 Cannabis use, unspecified, uncomplicated: Secondary | ICD-10-CM | POA: Diagnosis not present

## 2024-04-08 DIAGNOSIS — I509 Heart failure, unspecified: Secondary | ICD-10-CM | POA: Insufficient documentation

## 2024-04-08 DIAGNOSIS — Z6841 Body Mass Index (BMI) 40.0 and over, adult: Secondary | ICD-10-CM | POA: Insufficient documentation

## 2024-04-08 DIAGNOSIS — Z87891 Personal history of nicotine dependence: Secondary | ICD-10-CM | POA: Insufficient documentation

## 2024-04-08 DIAGNOSIS — E66813 Obesity, class 3: Secondary | ICD-10-CM | POA: Insufficient documentation

## 2024-04-08 DIAGNOSIS — I110 Hypertensive heart disease with heart failure: Secondary | ICD-10-CM | POA: Insufficient documentation

## 2024-04-08 DIAGNOSIS — C50812 Malignant neoplasm of overlapping sites of left female breast: Secondary | ICD-10-CM | POA: Diagnosis not present

## 2024-04-08 DIAGNOSIS — C50912 Malignant neoplasm of unspecified site of left female breast: Secondary | ICD-10-CM | POA: Diagnosis not present

## 2024-04-08 DIAGNOSIS — I5021 Acute systolic (congestive) heart failure: Secondary | ICD-10-CM

## 2024-04-08 DIAGNOSIS — G8918 Other acute postprocedural pain: Secondary | ICD-10-CM | POA: Diagnosis not present

## 2024-04-08 HISTORY — PX: BREAST LUMPECTOMY WITH RADIOACTIVE SEED AND SENTINEL LYMPH NODE BIOPSY: SHX6550

## 2024-04-08 HISTORY — PX: BREAST LUMPECTOMY: SHX2

## 2024-04-08 HISTORY — PX: AXILLARY SENTINEL NODE BIOPSY: SHX5738

## 2024-04-08 SURGERY — BREAST LUMPECTOMY
Anesthesia: General | Site: Breast | Laterality: Left

## 2024-04-08 MED ORDER — HYDROMORPHONE HCL 1 MG/ML IJ SOLN
INTRAMUSCULAR | Status: DC | PRN
  Administered 2024-04-08: .5 mg via INTRAVENOUS

## 2024-04-08 MED ORDER — ONDANSETRON HCL 4 MG/2ML IJ SOLN
INTRAMUSCULAR | Status: DC | PRN
  Administered 2024-04-08: 4 mg via INTRAVENOUS

## 2024-04-08 MED ORDER — BUPIVACAINE-EPINEPHRINE (PF) 0.25% -1:200000 IJ SOLN
INTRAMUSCULAR | Status: AC
  Filled 2024-04-08: qty 30

## 2024-04-08 MED ORDER — SUGAMMADEX SODIUM 200 MG/2ML IV SOLN
INTRAVENOUS | Status: DC | PRN
  Administered 2024-04-08: 300 mg via INTRAVENOUS

## 2024-04-08 MED ORDER — PHENYLEPHRINE 80 MCG/ML (10ML) SYRINGE FOR IV PUSH (FOR BLOOD PRESSURE SUPPORT)
PREFILLED_SYRINGE | INTRAVENOUS | Status: DC | PRN
  Administered 2024-04-08 (×3): 160 ug via INTRAVENOUS
  Administered 2024-04-08: 80 ug via INTRAVENOUS

## 2024-04-08 MED ORDER — FENTANYL CITRATE (PF) 250 MCG/5ML IJ SOLN
INTRAMUSCULAR | Status: DC | PRN
  Administered 2024-04-08 (×2): 100 ug via INTRAVENOUS
  Administered 2024-04-08: 50 ug via INTRAVENOUS

## 2024-04-08 MED ORDER — CLONIDINE HCL (ANALGESIA) 100 MCG/ML EP SOLN
EPIDURAL | Status: DC | PRN
  Administered 2024-04-08: 50 ug

## 2024-04-08 MED ORDER — CLINDAMYCIN PHOSPHATE 900 MG/50ML IV SOLN
900.0000 mg | INTRAVENOUS | Status: AC
  Administered 2024-04-08: 900 mg via INTRAVENOUS
  Filled 2024-04-08: qty 50

## 2024-04-08 MED ORDER — HYDROMORPHONE HCL 1 MG/ML IJ SOLN
INTRAMUSCULAR | Status: AC
  Filled 2024-04-08: qty 0.5

## 2024-04-08 MED ORDER — LACTATED RINGERS IV SOLN: INTRAVENOUS | Status: DC

## 2024-04-08 MED ORDER — BUPIVACAINE-EPINEPHRINE 0.25% -1:200000 IJ SOLN
INTRAMUSCULAR | Status: DC | PRN
  Administered 2024-04-08: 20 mL

## 2024-04-08 MED ORDER — CHLORHEXIDINE GLUCONATE 0.12 % MT SOLN
15.0000 mL | Freq: Once | OROMUCOSAL | Status: AC
  Administered 2024-04-08: 15 mL via OROMUCOSAL
  Filled 2024-04-08: qty 15

## 2024-04-08 MED ORDER — CHLORHEXIDINE GLUCONATE CLOTH 2 % EX PADS: 6.0000 | MEDICATED_PAD | Freq: Once | CUTANEOUS | Status: DC

## 2024-04-08 MED ORDER — HEMOSTATIC AGENTS (NO CHARGE) OPTIME
TOPICAL | Status: DC | PRN
  Administered 2024-04-08: 1 via TOPICAL

## 2024-04-08 MED ORDER — FENTANYL CITRATE (PF) 100 MCG/2ML IJ SOLN
INTRAMUSCULAR | Status: AC
  Filled 2024-04-08: qty 2

## 2024-04-08 MED ORDER — FENTANYL CITRATE (PF) 100 MCG/2ML IJ SOLN
25.0000 ug | INTRAMUSCULAR | Status: DC | PRN
  Administered 2024-04-08: 25 ug via INTRAVENOUS

## 2024-04-08 MED ORDER — OXYCODONE HCL 5 MG PO TABS: 5.0000 mg | ORAL_TABLET | Freq: Four times a day (QID) | ORAL | 0 refills | Status: DC | PRN

## 2024-04-08 MED ORDER — AMISULPRIDE (ANTIEMETIC) 5 MG/2ML IV SOLN: 10.0000 mg | Freq: Once | INTRAVENOUS | Status: DC | PRN

## 2024-04-08 MED ORDER — MIDAZOLAM HCL 2 MG/2ML IJ SOLN
1.0000 mg | Freq: Once | INTRAMUSCULAR | Status: AC
  Filled 2024-04-08: qty 1

## 2024-04-08 MED ORDER — PROPOFOL 10 MG/ML IV BOLUS
INTRAVENOUS | Status: DC | PRN
Start: 2024-04-08 — End: 2024-04-08
  Administered 2024-04-08: 200 mg via INTRAVENOUS

## 2024-04-08 MED ORDER — ROCURONIUM BROMIDE 100 MG/10ML IV SOLN
INTRAVENOUS | Status: DC | PRN
  Administered 2024-04-08: 30 mg via INTRAVENOUS

## 2024-04-08 MED ORDER — OXYCODONE HCL 5 MG PO TABS
ORAL_TABLET | ORAL | Status: AC
  Filled 2024-04-08: qty 1

## 2024-04-08 MED ORDER — MIDAZOLAM HCL 2 MG/2ML IJ SOLN
INTRAMUSCULAR | Status: AC
  Filled 2024-04-08: qty 2

## 2024-04-08 MED ORDER — ORAL CARE MOUTH RINSE: 15.0000 mL | Freq: Once | OROMUCOSAL | Status: AC

## 2024-04-08 MED ORDER — FENTANYL CITRATE (PF) 100 MCG/2ML IJ SOLN
INTRAMUSCULAR | Status: AC
Start: 2024-04-08 — End: ?
  Filled 2024-04-08: qty 2

## 2024-04-08 MED ORDER — BUPIVACAINE-EPINEPHRINE (PF) 0.5% -1:200000 IJ SOLN
INTRAMUSCULAR | Status: DC | PRN
Start: 2024-04-08 — End: 2024-04-08
  Administered 2024-04-08: 30 mL

## 2024-04-08 MED ORDER — SUCCINYLCHOLINE CHLORIDE 200 MG/10ML IV SOSY
PREFILLED_SYRINGE | INTRAVENOUS | Status: DC | PRN
  Administered 2024-04-08: 140 mg via INTRAVENOUS

## 2024-04-08 MED ORDER — 0.9 % SODIUM CHLORIDE (POUR BTL) OPTIME
TOPICAL | Status: DC | PRN
  Administered 2024-04-08: 1000 mL

## 2024-04-08 MED ORDER — MIDAZOLAM HCL 2 MG/2ML IJ SOLN
INTRAMUSCULAR | Status: DC | PRN
  Administered 2024-04-08: 2 mg via INTRAVENOUS

## 2024-04-08 MED ORDER — FENTANYL CITRATE (PF) 250 MCG/5ML IJ SOLN
INTRAMUSCULAR | Status: AC
  Filled 2024-04-08: qty 5

## 2024-04-08 MED ORDER — PROPOFOL 10 MG/ML IV BOLUS
INTRAVENOUS | Status: AC
  Filled 2024-04-08: qty 20

## 2024-04-08 MED ORDER — LIDOCAINE 2% (20 MG/ML) 5 ML SYRINGE
INTRAMUSCULAR | Status: AC
  Filled 2024-04-08: qty 5

## 2024-04-08 MED ORDER — OXYCODONE HCL 5 MG PO TABS
5.0000 mg | ORAL_TABLET | Freq: Once | ORAL | Status: AC | PRN
  Administered 2024-04-08: 5 mg via ORAL

## 2024-04-08 MED ORDER — DEXAMETHASONE SODIUM PHOSPHATE 10 MG/ML IJ SOLN
INTRAMUSCULAR | Status: AC
  Filled 2024-04-08: qty 1

## 2024-04-08 MED ORDER — MIDAZOLAM HCL 2 MG/2ML IJ SOLN
INTRAMUSCULAR | Status: AC
  Administered 2024-04-08: 1 mg via INTRAVENOUS
  Filled 2024-04-08: qty 2

## 2024-04-08 MED ORDER — LIDOCAINE 2% (20 MG/ML) 5 ML SYRINGE
INTRAMUSCULAR | Status: DC | PRN
Start: 2024-04-08 — End: 2024-04-08
  Administered 2024-04-08: 40 mg via INTRAVENOUS

## 2024-04-08 MED ORDER — GLYCOPYRROLATE 0.2 MG/ML IJ SOLN
INTRAMUSCULAR | Status: DC | PRN
  Administered 2024-04-08: .2 mg via INTRAVENOUS

## 2024-04-08 MED ORDER — FENTANYL CITRATE PF 50 MCG/ML IJ SOSY
50.0000 ug | PREFILLED_SYRINGE | Freq: Once | INTRAMUSCULAR | Status: AC
  Administered 2024-04-08: 50 ug via INTRAVENOUS
  Filled 2024-04-08: qty 1

## 2024-04-08 MED ORDER — MAGTRACE LYMPHATIC TRACER
INTRAMUSCULAR | Status: DC | PRN
  Administered 2024-04-08: 2 mL via INTRAMUSCULAR

## 2024-04-08 MED ORDER — DEXAMETHASONE SODIUM PHOSPHATE 10 MG/ML IJ SOLN
INTRAMUSCULAR | Status: DC | PRN
  Administered 2024-04-08: 5 mg via INTRAVENOUS

## 2024-04-08 MED ORDER — OXYCODONE HCL 5 MG/5ML PO SOLN: 5.0000 mg | Freq: Once | ORAL | Status: AC | PRN

## 2024-04-08 MED ORDER — ONDANSETRON HCL 4 MG/2ML IJ SOLN
INTRAMUSCULAR | Status: AC
  Filled 2024-04-08: qty 2

## 2024-04-08 SURGICAL SUPPLY — 39 items
BAG COUNTER SPONGE SURGICOUNT (BAG) IMPLANT
BINDER BREAST LRG (GAUZE/BANDAGES/DRESSINGS) IMPLANT
BINDER BREAST XLRG (GAUZE/BANDAGES/DRESSINGS) IMPLANT
BINDER BREAST XXLRG (GAUZE/BANDAGES/DRESSINGS) IMPLANT
CANISTER SUCTION 3000ML PPV (SUCTIONS) ×2 IMPLANT
CHLORAPREP W/TINT 26 (MISCELLANEOUS) ×2 IMPLANT
CLIP APPLIE 9.375 MED OPEN (MISCELLANEOUS) ×2 IMPLANT
CLIP APPLIE 9.375 SM OPEN (CLIP) IMPLANT
CNTNR URN SCR LID CUP LEK RST (MISCELLANEOUS) IMPLANT
COVER PROBE W GEL 5X96 (DRAPES) ×2 IMPLANT
COVER SURGICAL LIGHT HANDLE (MISCELLANEOUS) ×2 IMPLANT
DERMABOND ADVANCED .7 DNX12 (GAUZE/BANDAGES/DRESSINGS) ×2 IMPLANT
DEVICE DUBIN SPECIMEN MAMMOGRA (MISCELLANEOUS) ×2 IMPLANT
DRAPE CHEST BREAST 15X10 FENES (DRAPES) ×2 IMPLANT
ELECT CAUTERY BLADE 6.4 (BLADE) ×2 IMPLANT
ELECTRODE REM PT RTRN 9FT ADLT (ELECTROSURGICAL) ×2 IMPLANT
GAUZE PAD ABD 8X10 STRL (GAUZE/BANDAGES/DRESSINGS) ×2 IMPLANT
GLOVE BIO SURGEON STRL SZ8 (GLOVE) ×2 IMPLANT
GLOVE BIOGEL PI IND STRL 8 (GLOVE) ×2 IMPLANT
GOWN STRL REUS W/ TWL LRG LVL3 (GOWN DISPOSABLE) ×2 IMPLANT
GOWN STRL REUS W/ TWL XL LVL3 (GOWN DISPOSABLE) ×2 IMPLANT
HEMOSTAT ARISTA ABSORB 3G PWDR (HEMOSTASIS) IMPLANT
KIT BASIN OR (CUSTOM PROCEDURE TRAY) ×2 IMPLANT
KIT MARKER MARGIN INK (KITS) ×2 IMPLANT
LIGHT WAVEGUIDE WIDE FLAT (MISCELLANEOUS) IMPLANT
NDL 18GX1X1/2 (RX/OR ONLY) (NEEDLE) IMPLANT
NDL FILTER BLUNT 18X1 1/2 (NEEDLE) IMPLANT
NDL HYPO 25GX1X1/2 BEV (NEEDLE) ×2 IMPLANT
NEEDLE 18GX1X1/2 (RX/OR ONLY) (NEEDLE) ×2 IMPLANT
NEEDLE FILTER BLUNT 18X1 1/2 (NEEDLE) IMPLANT
NEEDLE HYPO 25GX1X1/2 BEV (NEEDLE) ×2 IMPLANT
NS IRRIG 1000ML POUR BTL (IV SOLUTION) ×2 IMPLANT
PACK GENERAL/GYN (CUSTOM PROCEDURE TRAY) ×2 IMPLANT
SUT MNCRL AB 4-0 PS2 18 (SUTURE) ×2 IMPLANT
SUT VIC AB 3-0 SH 18 (SUTURE) ×2 IMPLANT
SYR 3ML LL SCALE MARK (SYRINGE) IMPLANT
SYR CONTROL 10ML LL (SYRINGE) ×2 IMPLANT
TOWEL GREEN STERILE (TOWEL DISPOSABLE) ×2 IMPLANT
TOWEL GREEN STERILE FF (TOWEL DISPOSABLE) ×2 IMPLANT

## 2024-04-08 NOTE — Anesthesia Preprocedure Evaluation (Addendum)
 Anesthesia Evaluation  Patient identified by MRN, date of birth, ID band Patient awake    Reviewed: Allergy & Precautions, NPO status , Patient's Chart, lab work & pertinent test results, reviewed documented beta blocker date and time   Airway Mallampati: III  TM Distance: >3 FB Neck ROM: Full    Dental  (+) Missing, Dental Advisory Given,    Pulmonary asthma , Patient abstained from smoking.   Pulmonary exam normal breath sounds clear to auscultation       Cardiovascular hypertension, Pt. on medications and Pt. on home beta blockers +CHF and + DOE  Normal cardiovascular exam Rhythm:Regular Rate:Normal  TTE 2024  1. Left ventricular ejection fraction, by estimation, is 30 to 35%. The  left ventricle has moderately decreased function. The left ventricle  demonstrates global hypokinesis. The left ventricular internal cavity size  was moderately dilated. Left  ventricular diastolic parameters are consistent with Grade I diastolic  dysfunction (impaired relaxation).   2. Right ventricular systolic function is normal. The right ventricular  size is normal.   3. The mitral valve is normal in structure. No evidence of mitral valve  regurgitation. No evidence of mitral stenosis.   4. The aortic valve is tricuspid. Aortic valve regurgitation is mild. No  aortic stenosis is present.   5. Aortic dilatation noted. There is mild dilatation of the ascending  aorta, measuring 40 mm.   6. The inferior vena cava is normal in size with greater than 50%  respiratory variability, suggesting right atrial pressure of 3 mmHg.    Cath 2022  LV end diastolic pressure is moderately elevated.  There is no aortic valve stenosis.  Hemodynamic findings consistent with mild pulmonary hypertension.  Ao sat 99%, PA sat 73%, mean PA pressure 33 mmHg, mean PCWP 22 mmHg, cardiac output 6.2 L/min, cardiac index 2.6.  No angiographically apparent coronary  artery disease. Significant tortuosity noted in the RCA and the circumflex.      Neuro/Psych  PSYCHIATRIC DISORDERS Anxiety Depression    negative neurological ROS     GI/Hepatic negative GI ROS,,,(+)     substance abuse  marijuana use  Endo/Other    Class 3 obesity (BMI 41)  Renal/GU negative Renal ROS  negative genitourinary   Musculoskeletal  (+) Arthritis ,    Abdominal   Peds  Hematology negative hematology ROS (+)   Anesthesia Other Findings   Reproductive/Obstetrics                             Anesthesia Physical Anesthesia Plan  ASA: 3  Anesthesia Plan: General   Post-op Pain Management: Tylenol  PO (pre-op)* and Regional block*   Induction: Intravenous  PONV Risk Score and Plan: 3 and Ondansetron , Dexamethasone  and Midazolam   Airway Management Planned: Oral ETT  Additional Equipment:   Intra-op Plan:   Post-operative Plan: Extubation in OR  Informed Consent: I have reviewed the patients History and Physical, chart, labs and discussed the procedure including the risks, benefits and alternatives for the proposed anesthesia with the patient or authorized representative who has indicated his/her understanding and acceptance.     Dental advisory given  Plan Discussed with: CRNA  Anesthesia Plan Comments: (PAT note written 02/26/2024 by Allison Zelenak, PA-C.  )       Anesthesia Quick Evaluation

## 2024-04-08 NOTE — Discharge Instructions (Signed)

## 2024-04-08 NOTE — Op Note (Signed)
 Preoperative diagnosis: Left breast cancer stage I overlapping sites  Postoperative diagnosis: Same  Procedure: Left breast reexcision lumpectomy with left axillary sentinel lymph node mapping of deep level 1 left axillary lymph node mag trace  Surgeon: Sim Dryer, MD  Anesthesia: General With 0.25% Marcaine   EBL: 100 cc  Drains: None  Specimen: Left breast lumpectomy cavity all margins, 3 left axillary sentinel nodes  Indications for procedure: The patient is a 58 year old female with left breast papillary cancer diagnosed upon excision of a papilloma.  She presents for wide excision of her margins since these were positive sentinel lymph node mapping.The procedure has been discussed with the patient. Alternatives to surgery have been discussed with the patient.  Risks of surgery include bleeding,  Infection,  Seroma formation, death,  and the need for further surgery.   The patient understands and wishes to proceed. Sentinel lymph node mapping and dissection has been discussed with the patient.  Risk of bleeding,  Infection,  Seroma formation,  Additional procedures,,  Shoulder weakness ,  Shoulder stiffness,  Nerve and blood vessel injury and reaction to the mapping dyes have been discussed.  Alternatives to surgery have been discussed with the patient.  The patient agrees to proceed.    Description of procedure: The patient was met in the left side was marked as correct.  Questions answered.  Left pectoral block administered by anesthesia.  She was taken back to the operative room placed upon the OR table.  After induction of general esthesia, 2 cc of mag trace were injected in the left breast massaged for 5 minutes.  The left breast was then prepped and draped in sterile fashion and second timeout performed.  The lumpectomy cavity is open.  The seroma evacuated.  All margins were excised.  Local anesthetic infiltrated throughout.  Hemostasis achieved with cautery and the cavity was closed  after placement of clips with 3-0 Vicryl and 4 Monocryl.  Mag trace probe was used.  Signal was detected in the left axilla.  A 4 cm incision was made along the left axillary hairline.  Dissection was carried down into the level 1 deep left Eksir nodes.  There were 3 nodes identified removed.  Hemostasis was achieved with cautery, Arista and clips.  The long thoracic nerve, thoracodorsal trunk and axillary vein were all preserved.  Local anesthetic infiltrated.  Deep tissue planes approximated 3-0 Vicryl.  4 Monocryl used to close the skin in a subcuticular fashion.  Dermabond applied.  All counts found to be correct.  Breast binder placed.  The patient was awoke extubated taken to recovery in satisfactory condition.

## 2024-04-08 NOTE — Transfer of Care (Signed)
 Immediate Anesthesia Transfer of Care Note  Patient: Janet [name]  Procedure(s) Performed: BREAST LUMPECTOMY WITH RADIOACTIVE SEED AND SENTINEL LYMPH NODE BIOPSY (Left: Breast)  Patient Location: PACU  Anesthesia Type:General  Level of Consciousness: awake, alert , and oriented  Airway & Oxygen Therapy: Patient Spontanous Breathing and Patient connected to face mask oxygen  Post-op Assessment: Report given to RN and Post -op Vital signs reviewed and stable  Post vital signs: Reviewed and stable  Last Vitals:  Vitals Value Taken Time  BP 115/87 04/08/24 1600  Temp    Pulse 81 04/08/24 1603  Resp 14 04/08/24 1603  SpO2 99 % 04/08/24 1603  Vitals shown include unfiled device data.  Last Pain:  Vitals:   04/08/24 1110  TempSrc:   PainSc: 0-No pain         Complications: No notable events documented.

## 2024-04-08 NOTE — Interval H&P Note (Signed)
 History and Physical Interval Note:  04/08/2024 1:12 PM  Janet [name]  has presented today for surgery, with the diagnosis of LEFT BREAST CANCER.  The various methods of treatment have been discussed with the patient and family. After consideration of risks, benefits and other options for treatment, the patient has consented to  Procedure(s) with comments: BREAST LUMPECTOMY WITH RADIOACTIVE SEED AND SENTINEL LYMPH NODE BIOPSY (Left) - LEFT BREAST RE-EXCISION LUMPECTOMY, LEFT SENTINEL LYMPH NODE MAPPING as a surgical intervention.  The patient's history has been reviewed, patient examined, no change in status, stable for surgery.  I have reviewed the patient's chart and labs.  Questions were answered to the patient's satisfaction.   Sentinel lymph node mapping and dissection has been discussed with the patient.  Risk of bleeding,  Infection,  Seroma formation,  Additional procedures,,  Shoulder weakness ,  Shoulder stiffness,  Nerve and blood vessel injury and reaction to the mapping dyes have been discussed.  Alternatives to surgery have been discussed with the patient.  The patient agrees to proceed.   Kimberely Mccannon A Pegi Milazzo

## 2024-04-08 NOTE — H&P (Signed)
 History of Present Illness: Janet [name] is a 58 y.o. female who is seen today for postop check after left breast lumpectomy for papilloma. Final pathology showed invasive papillary carcinoma with DCIS. She had positive margins. Total size was 5.1 cm. This was ER positive, PR positive, HER2/neu negative with a KI of 20%. She has been seen by medical oncology..    Review of Systems: A complete review of systems was obtained from the patient. I have reviewed this information and discussed as appropriate with the patient. See HPI as well for other ROS.    Medical History: Past Medical History:  Diagnosis Date  Anxiety  Arthritis  Asthma, unspecified asthma severity, unspecified whether complicated, unspecified whether persistent (HHS-HCC)  Hypertension   There is no problem list on file for this patient.  Past Surgical History:  Procedure Laterality Date  HERNIA REPAIR  HYSTERECTOMY  LAPAROSCOPIC TUBAL LIGATION    Allergies  Allergen Reactions  Biaxin [Clarithromycin] Other (See Comments)  Upset stomach, Cramps  Penicillin Rash   Current Outpatient Medications on File Prior to Visit  Medication Sig Dispense Refill  albuterol  (PROVENTIL ) 5 mg/mL nebulizer solution Inhale 2.5 mg into the lungs  albuterol  MDI, PROVENTIL , VENTOLIN , PROAIR , HFA 90 mcg/actuation inhaler INHALE 1 TO 2 PUFFS INTO THE LUNGS EVERY 6 HOURS AS NEEDED FOR WHEEZING OR SHORTNESS OF BREATH  aspirin  81 MG chewable tablet Chew 1 tablet every day by oral route for 90 days.  budesonide -glycopyrrolate -formoterol  (BREZTRI  AEROSPHERE) 160-9-4.8 mcg/actuation inhaler Inhale 2 Inhalations into the lungs 2 (two) times daily  carvediloL  (COREG ) 12.5 MG tablet Take 12.5 mg by mouth 2 (two) times daily  ENTRESTO  49-51 mg tablet TAKE ONE TABLET BY MOUTH TWICE DAILY @ 9AM-5PM  FLONASE  ALLERGY RELIEF 50 mcg/actuation nasal spray Spray 1 spray every day by intranasal route.  FUROsemide  (LASIX ) 20 MG tablet Take 1 tablet  by mouth once daily  hydrOXYzine  (VISTARIL ) 50 MG capsule Take 50 mg by mouth 2 (two) times daily  montelukast  (SINGULAIR ) 10 mg tablet Take 1 tablet by mouth at bedtime  spironolactone  (ALDACTONE ) 25 MG tablet Take 1 tablet by mouth once daily  traZODone  (DESYREL ) 150 MG tablet Take 1 tablet by mouth at bedtime  ARIPiprazole  (ABILIFY ) 10 MG tablet Take 1 tablet by mouth at bedtime  dapagliflozin  propanediol (FARXIGA ) 10 mg tablet Take 1 tablet by mouth once daily  ergocalciferol , vitamin D2, 1,250 mcg (50,000 unit) capsule TAKE 1 CAPSULE BY MOUTH EVERY 7 DAYS FOR 12 DOSES   No current facility-administered medications on file prior to visit.   Family History  Problem Relation Age of Onset  Obesity Mother  High blood pressure (Hypertension) Mother  Skin cancer Mother  Obesity Sister  High blood pressure (Hypertension) Sister  Obesity Brother  High blood pressure (Hypertension) Brother  Diabetes Brother    Social History   Tobacco Use  Smoking Status Never  Smokeless Tobacco Never    Social History   Socioeconomic History  Marital status: Single  Tobacco Use  Smoking status: Never  Smokeless tobacco: Never  Vaping Use  Vaping status: Unknown  Substance and Sexual Activity  Alcohol use: Yes  Drug use: Yes  Comment: THC   Social Drivers of Corporate investment banker Strain: Low Risk (01/03/2024)  Received [facility]  Overall Financial Resource Strain (CARDIA)  Difficulty of Paying Living Expenses: Not very hard  Food Insecurity: Food Insecurity Present (03/13/2024)  Received from [facility]  Hunger Vital Sign  Worried About Running Out of  Food in the Last Year: Never true  Ran Out of Food in the Last Year: Sometimes true  Transportation Needs: No Transportation Needs (03/13/2024)  Received from [facility] - Transportation  Lack of Transportation (Medical): No  Lack of Transportation (Non-Medical): No  Physical Activity: Inactive (01/03/2024)   Received from [facility]  Exercise Vital Sign  Days of Exercise per Week: 0 days  Minutes of Exercise per Session: 0 min  Stress: Stress Concern Present (01/03/2024)  Received from [facility] - Occupational Stress Questionnaire  Feeling of Stress : Very much  Social Connections: Socially Isolated (01/03/2024)  Received from [facility]  Social Connection and Isolation Panel [NHANES]  Frequency of Communication with Friends and Family: More than three times a week  Frequency of Social Gatherings with Friends and Family: Once a week  Attends Religious Services: Never  Database administrator or Organizations: No  Marital Status: Never married  Housing Stability: Unknown (03/24/2024)  Housing Stability Vital Sign  Homeless in the Last Year: No   Objective:   Vitals:  03/24/24 1044  PainSc: 0-No pain   There is no height or weight on file to calculate BMI.  Physical Exam Exam conducted with a chaperone present.  Cardiovascular:  Rate and Rhythm: Normal rate.  Pulmonary:  Effort: Pulmonary effort is normal.  Chest:  Breasts: Right: Normal.   Comments: Left breast incision clean dry intact. Lymphadenopathy:  Upper Body:  Right upper body: No supraclavicular or axillary adenopathy.  Left upper body: No supraclavicular or axillary adenopathy.  Neurological:  General: No focal deficit present.  Mental Status: She is alert.     Labs, Imaging and Diagnostic Testing:  Reason for Addendum #1: Breast Biomarker Results  Clinical History: left papilloma, mass (cm)  FINAL MICROSCOPIC DIAGNOSIS:  A. BREAST, LEFT, LUMPECTOMY: Papillary carcinoma with invasion, 5.1 cm, grade 2 Ductal carcinoma in situ: Cribriform, nuclear grade 2 Margins, invasive: Positive Closest, invasive: Posterior and medial Margins, DCIS: Negative Closest, DCIS: 1 mm, superior and anterior Lymphovascular invasion: Not identified Prognostic markers: ER  pending, PR pending, Her2 pending, Ki-67 pending Other: Biopsy site and biopsy clip See oncology table Oncology table: INVASIVE CARCINOMA OF THE BREAST: Resection Procedure: Left breast lumpectomy Specimen Laterality: Left breast Histologic Type: Papillary carcinoma with invasion Histologic Grade: Glandular (Acinar)/Tubular Differentiation: 3 Nuclear Pleomorphism: 2 Mitotic Rate: 1 Overall Grade: 2 Tumor Size: 5.1 x 2.8 x 2 cm Ductal Carcinoma In Situ: Cribriform with intermediate nuclear grade Lymphatic and/or Vascular Invasion: Not identified Treatment Effect in the Breast: No known presurgical therapy Margins: Distance from Closest Margin (mm): 0 mm Specify Closest Margin (required only if <44mm): Posterior and medial DCIS Margins: Distance from Closest Margin (mm): 1 mm Specify Closest Margin (required only if <71mm): Superior and anterior Regional Lymph Nodes: No lymph nodes submitted] Distant Metastasis: Distant Site(s) Involved: Not applicable Breast Biomarker Testing Performed on Previous Biopsy: Testing Performed on Case Number: Will be performed and reported as an addendum Estrogen Receptor: Pending Progesterone Receptor: Pending HER2: Pending Ki-67: Pending Pathologic Stage Classification (pTNM, AJCC 8th Edition): pT3, pN not assigned Representative Tumor Block: A1-A9 Comment(s): The masses 5.1 cm in greatest dimension and has features of papillary carcinoma with invasion which is confirmed with immunohistochemistry for basal cell markers (calponin, p63 and smooth muscle myosin). There is associated grade 2 ductal carcinoma in situ.  Dr. Kavin Parsley reviewed this case and agrees.  Dr. Afton Horse was notified on 03/04/2024. (v4.5.0.0)  GROSS  DESCRIPTION: Specimen type: Left breast seed (x 2) localization lumpectomy, received fresh. The specimen is placed in formalin at 3:40 PM on 02/28/2024. Size: 8.2 cm at the superior-inferior axis, 4.3 cm at the  lateral-medial axis and 3.1 cm at the anterior-posterior axis. Orientation: The specimen is oriented with previously applied inks (anterior green, inferior blue, lateral orange, medial yellow, posterior black, superior red). Localized area: The localization seeds (x 2) are identified at the time of receipt. Cut surface: The localization seeds bracket a 5.1 x 2.8 x 2 cm rubbery, well circumscribed nodular mass which has a tan-red cut surface. There is a ribbon biopsy clip in the mass. The remainder of the breast tissue consists of soft yellow adipose tissue and white fibrous tissue. Margins: The mass abuts the superior, medial, posterior and anterior margins does not grossly appear to have been transected. The lateral margin measures 0.5 cm. The inferior margin measures greater than 1 cm. Prognostic indicators: Obtained from paraffin blocks if needed Block summary: 10 blocks submitted 1-9 = sections of mass to include the superior, anterior, posterior, medial and lateral margins 10 = inferior margin (GRP 02/29/2024)  Final Diagnosis performed by Ramey [place], MD. Electronically signed 03/04/2024 Technical and / or Professional components performed at [facility]. [facility], [address] [place], [place] [address]. Immunohistochemistry Technical component (if applicable) was performed at [facility]. [address], [place], [place] [address]. IMMUNOHISTOCHEMISTRY DISCLAIMER (if applicable): Some of these immunohistochemical stains may have been developed and the performance characteristics determine by [facility]. Some may not have been cleared or approved by the U.S. Food and Drug Administration. The FDA has determined that such clearance or approval is not necessary. This test is used for clinical purposes. It should not be regarded as investigational or for research. This laboratory is certified under the Clinical Laboratory  Improvement Amendments of 1988 (CLIA-88) as qualified to perform high complexity clinical laboratory testing. The controls stained appropriately. IHC stains are performed on formalin fixed, paraffin embedded tissue using a 3,3"diaminobenzidine (DAB) chromogen and Leica Bond Autostainer System. The staining intensity of the nucleus is score manually and is reported as the percentage of tumor cell nuclei demonstrating specific nuclear staining. The specimens are fixed in 10% Neutral Formalin for at least 6 hours and up to 72hrs. These tests are validated on decalcified tissue. Results should be interpreted with caution given the possibility of false negative results on decalcified specimens. Antibody Clones are as follows ER-clone [id number], PR-clone 16, Ki67- clone MM1. Some of these immunohistochemical stains may have been developed and the performance characteristics determined by [facility].  ADDENDUM: PROGNOSTIC INDICATOR RESULTS:  Immunohistochemical and morphometric analysis performed manually  The tumor cells are NEGATIVE for Her2 (1+).  Estrogen Receptor: 95%, POSTIVE, STRONG STAINING INTENSITY Progesterone Receptor: 10%, POSITIVE, MODERATE STAINING INTENSITY Proliferation Marker Ki-67: 20%  Comment: The negative hormone receptor study(ies) in the case has an internal positive control.   Assessment and Plan:   Papillary carcinoma left breast with DCIS-patient had a papilloma removed. Follow pathology showed grade 2 invasive papillary carcinoma left breast with DCIS. Margins were positive. Plan is for reexcision left breast lumpectomy and left axillary sentinel lymph node mapping. Risks and benefits reviewed as well as risk of lymphedema. Risk of bleeding, infection, pain, decreased mobility, lymphedema, and the need for other treatments and/or procedures reviewed today. Surgery is planned for 2 weeks. She has no further questions.  No follow-ups on file.  Quintavious Rinck ANTHONY  Afton Horse, MD

## 2024-04-08 NOTE — Anesthesia Procedure Notes (Signed)
 Procedure Name: Intubation Date/Time: 04/08/2024 2:28 PM  Performed by: Viki Graver, CRNAPre-anesthesia Checklist: Patient identified, Emergency Drugs available, Suction available and Patient being monitored Patient Re-evaluated:Patient Re-evaluated prior to induction Oxygen Delivery Method: Circle System Utilized Preoxygenation: Pre-oxygenation with 100% oxygen Induction Type: IV induction Ventilation: Mask ventilation without difficulty Laryngoscope Size: Miller and 2 Grade View: Grade II Tube type: Oral Tube size: 7.0 mm Number of attempts: 1 Airway Equipment and Method: Stylet Placement Confirmation: ETT inserted through vocal cords under direct vision, positive ETCO2 and breath sounds checked- equal and bilateral Secured at: 23 cm Tube secured with: Tape Dental Injury: Teeth and Oropharynx as per pre-operative assessment

## 2024-04-08 NOTE — Anesthesia Procedure Notes (Signed)
 Anesthesia Regional Block: Pectoralis block   Pre-Anesthetic Checklist: , timeout performed,  Correct Patient, Correct Site, Correct Laterality,  Correct Procedure, Correct Position, site marked,  Risks and benefits discussed,  Surgical consent,  Pre-op evaluation,  At surgeon's request and post-op pain management  Laterality: Left  Prep: chloraprep       Needles:  Injection technique: Single-shot  Needle Type: Echogenic Needle     Needle Length: 9cm  Needle Gauge: 21     Additional Needles:   Procedures:,,,, ultrasound used (permanent image in chart),,    Narrative:  Start time: 04/08/2024 12:38 PM End time: 04/08/2024 12:43 PM Injection made incrementally with aspirations every 5 mL.  Performed by: Personally  Anesthesiologist: Peggy Bowens, MD

## 2024-04-09 ENCOUNTER — Encounter (HOSPITAL_COMMUNITY): Payer: Self-pay | Admitting: Surgery

## 2024-04-09 ENCOUNTER — Other Ambulatory Visit: Payer: Self-pay | Admitting: Family

## 2024-04-09 DIAGNOSIS — J4540 Moderate persistent asthma, uncomplicated: Secondary | ICD-10-CM

## 2024-04-09 NOTE — Anesthesia Postprocedure Evaluation (Signed)
 Anesthesia Post Note  Patient: OREL HORD  Procedure(s) Performed: BREAST LUMPECTOMY WITH RADIOACTIVE SEED AND SENTINEL LYMPH NODE BIOPSY (Left: Breast)     Patient location during evaluation: PACU Anesthesia Type: General Level of consciousness: awake and alert Pain management: pain level controlled Vital Signs Assessment: post-procedure vital signs reviewed and stable Respiratory status: spontaneous breathing, nonlabored ventilation, respiratory function stable and patient connected to nasal cannula oxygen Cardiovascular status: blood pressure returned to baseline and stable Postop Assessment: no apparent nausea or vomiting Anesthetic complications: no   No notable events documented.  Last Vitals:  Vitals:   04/08/24 1645 04/08/24 1700  BP: (!) 119/98 123/84  Pulse: 82 78  Resp: 10 10  Temp:  36.6 C  SpO2: 99% 99%    Last Pain:  Vitals:   04/08/24 1700  TempSrc:   PainSc: 3                  Melvenia Stabs

## 2024-04-11 ENCOUNTER — Encounter: Payer: Self-pay | Admitting: *Deleted

## 2024-04-12 ENCOUNTER — Other Ambulatory Visit: Payer: Self-pay | Admitting: Family

## 2024-04-12 DIAGNOSIS — J4540 Moderate persistent asthma, uncomplicated: Secondary | ICD-10-CM

## 2024-04-15 ENCOUNTER — Ambulatory Visit: Payer: Self-pay | Admitting: Family

## 2024-04-15 ENCOUNTER — Encounter: Payer: Self-pay | Admitting: *Deleted

## 2024-04-15 LAB — SURGICAL PATHOLOGY

## 2024-04-15 NOTE — Telephone Encounter (Signed)
 Complete

## 2024-04-18 ENCOUNTER — Telehealth: Payer: Self-pay

## 2024-04-18 NOTE — Telephone Encounter (Signed)
 Verbally confirmed appt for 6/2

## 2024-04-21 ENCOUNTER — Encounter: Payer: Self-pay | Admitting: *Deleted

## 2024-04-21 ENCOUNTER — Inpatient Hospital Stay: Attending: Hematology and Oncology | Admitting: Hematology and Oncology

## 2024-04-21 ENCOUNTER — Telehealth: Payer: Self-pay | Admitting: *Deleted

## 2024-04-21 VITALS — BP 112/73 | HR 69 | Temp 97.9°F | Resp 19 | Wt 266.7 lb

## 2024-04-21 DIAGNOSIS — C50212 Malignant neoplasm of upper-inner quadrant of left female breast: Secondary | ICD-10-CM | POA: Insufficient documentation

## 2024-04-21 DIAGNOSIS — Z170 Estrogen receptor positive status [ER+]: Secondary | ICD-10-CM | POA: Diagnosis not present

## 2024-04-21 DIAGNOSIS — F1090 Alcohol use, unspecified, uncomplicated: Secondary | ICD-10-CM | POA: Insufficient documentation

## 2024-04-21 NOTE — Telephone Encounter (Signed)
Received order for Oncotype Testing per Dr. Chryl Heck. Requisition faxed to pathology and [facility]

## 2024-04-21 NOTE — Assessment & Plan Note (Signed)
 Assessment and Plan Assessment & Plan Breast cancer with lymph node involvement Recent surgery removed five lymph nodes, two with small foci of cancer. Margins negative. Oncotype testing planned for recurrence risk. Anastrozole therapy post-radiation to reduce estrogen. Discussed anastrozole side effects and cardiovascular risks. - Conduct Oncotype testing to assess recurrence risk. - Initiate anastrozole therapy post-radiation. - Monitor for anastrozole side effects, including hot flashes, vaginal dryness, bone density transient - Discuss potential cardiovascular risks of anastrozole.  Heart failure with reduced ejection fraction Ejection fraction at 30-35%. Chemotherapy not recommended due to heart function concerns and other comorbidities. - she is however interested in knowing her risk of recurrence, hence will proceed with oncotype.  Murleen Arms MD

## 2024-04-21 NOTE — Progress Notes (Signed)
 [facility] CONSULT NOTE  Patient Care Team: Senaida Dama, NP as PCP - General (Nurse Practitioner) Nahser, Lela Purple, MD as PCP - Cardiology (Cardiology) Nahser, Lela Purple, MD as Consulting Physician (Cardiology) Auther Bo, RN as Oncology Nurse Navigator Alane Hsu, RN as Oncology Nurse Navigator Murleen Arms, MD as Consulting Physician (Hematology and Oncology)  CHIEF COMPLAINTS/PURPOSE OF CONSULTATION:  Newly diagnosed breast cancer  HISTORY OF PRESENTING ILLNESS:  Janet [name] 58 y.o. female is here because of recent diagnosis of left breast cancer  I reviewed her records extensively and collaborated the history with the patient.  SUMMARY OF ONCOLOGIC HISTORY: Oncology History  Malignant neoplasm of upper-inner quadrant of left breast in female, estrogen receptor positive (HCC)  10/11/2023 Mammogram   There is a dominant solid mass at the palpable site of concern in the left breast at 6 o'clock measuring 4.2 cm. There are multiple other smaller solid-appearing masses in the left breast at 8 o'clock, 9 o'clock and 10 o'clock. No evidence of left axillary lymphadenopathy. No evidence of malignancy in the right breast.   03/11/2024 Initial Diagnosis   Malignant neoplasm of upper-inner quadrant of left breast in female, estrogen receptor positive (HCC)   03/11/2024 Cancer Staging   Staging form: Breast, AJCC 8th Edition - Pathologic: Stage Unknown (pT3, pNX, cM0, G2, ER+, PR+, HER2-) - Signed by Murleen Arms, MD on 03/11/2024 Histologic grading system: 3 grade system   03/13/2024 Pathology Results   Left lumpectomy  Papillary carcinoma with invasion, 5.1 cm, grade 2  Ductal carcinoma in situ: Cribriform, nuclear grade 2  Margins, invasive: Positive      Closest, invasive: Posterior and medial  Margins, DCIS: Negative      Closest, DCIS: 1 mm, superior and anterior  Lymphovascular invasion: Not identified   The tumor cells are NEGATIVE  for Her2 (1+).   Estrogen Receptor:       95%, POSTIVE, STRONG STAINING INTENSITY  Progesterone Receptor:   10%, POSITIVE, MODERATE STAINING INTENSITY  Proliferation Marker Ki-67:   20%     Discussed the use of AI scribe software for clinical note transcription with the patient, who gave verbal consent to proceed.  History of Present Illness Janet [name] is a 58 year old female with breast cancer who presents for follow-up after her most recent breast surgery.  She had margin excision since her last visit here which did not show any evidence of residual atypia or malignancy.  She had 5 sentinel lymph nodes removed, 2 out of 5 positive for metastatic carcinoma no evidence of extranodal extension or lymphovascular invasion.  She had a repeat echocardiogram recently which once again showed an ejection fraction of 30 to 35%. Rest of the pertinent 10 point ROS reviewed and negative.  MEDICAL HISTORY:  Past Medical History:  Diagnosis Date   Allergy    Anxiety    Arthritis    Asthma    Breast cancer (HCC) 02/28/2024   CHF (congestive heart failure) (HCC)    Congestive heart failure (CHF) (HCC)    Depression    Elevated brain natriuretic peptide (BNP) level    Hypertension    Lymphedema    Nonischemic cardiomyopathy (HCC) 03/2021   Pre-diabetes    SOB (shortness of breath)    Substance abuse (HCC)     SURGICAL HISTORY: Past Surgical History:  Procedure Laterality Date   ABDOMINAL HYSTERECTOMY     BREAST BIOPSY Right    BREAST BIOPSY Left 10/16/2023  times 3   BREAST BIOPSY Left 10/16/2023   US  LT BREAST BX W LOC DEV EA ADD LESION IMG BX SPEC US  GUIDE 10/16/2023 [facility]   BREAST BIOPSY Left 10/16/2023   US  LT BREAST BX W LOC DEV 1ST LESION IMG BX SPEC US  GUIDE 10/16/2023 [facility]   BREAST BIOPSY Left 10/16/2023   US  LT BREAST BX W LOC DEV EA ADD LESION IMG BX SPEC US  GUIDE 10/16/2023 [facility]   BREAST BIOPSY Left 02/27/2024   US  LT  RADIOACTIVE SEED LOC 02/27/2024 [facility]   BREAST BIOPSY Left 02/27/2024   US  LT RADIOACTIVE SEED EA ADD LESION 02/27/2024 [facility]   BREAST LUMPECTOMY WITH RADIOACTIVE SEED AND SENTINEL LYMPH NODE BIOPSY Left 04/08/2024   Procedure: BREAST LUMPECTOMY WITH RADIOACTIVE SEED AND SENTINEL LYMPH NODE BIOPSY;  Surgeon: Sim Dryer, MD;  Location: [facility] OR;  Service: General;  Laterality: Left;  LEFT BREAST RE-EXCISION LUMPECTOMY, LEFT SENTINEL LYMPH NODE MAPPING   BREAST LUMPECTOMY WITH RADIOACTIVE SEED LOCALIZATION Left 02/28/2024   Procedure: LEFT BREAST SEED LUMPECTOMY;  Surgeon: Sim Dryer, MD;  Location: [facility] OR;  Service: General;  Laterality: Left;  3 seeds   CESAREAN SECTION     COLONOSCOPY WITH PROPOFOL  N/A 12/10/2023   Procedure: COLONOSCOPY WITH PROPOFOL ;  Surgeon: Ace Holder, MD;  Location: [facility];  Service: Gastroenterology;  Laterality: N/A;   HEMOSTASIS CLIP PLACEMENT  12/10/2023   Procedure: HEMOSTASIS CLIP PLACEMENT;  Surgeon: Ace Holder, MD;  Location: [facility];  Service: Gastroenterology;;   HERNIA REPAIR     umbilical as a child   POLYPECTOMY  12/10/2023   Procedure: POLYPECTOMY;  Surgeon: Ace Holder, MD;  Location: [facility];  Service: Gastroenterology;;   RIGHT/LEFT HEART CATH AND CORONARY ANGIOGRAPHY N/A 04/01/2021   Procedure: RIGHT/LEFT HEART CATH AND CORONARY ANGIOGRAPHY;  Surgeon: Lucendia Rusk, MD;  Location: [facility]IVE CV [facility];  Service: Cardiovascular;  Laterality: N/A;   TUBAL LIGATION      SOCIAL HISTORY: Social History   Socioeconomic History   Marital status: Single    Spouse name: Not on file   Number of children: 2   Years of education: 14   Highest education level: Associate degree: academic program  Occupational History   Occupation: Consulting civil engineer   Occupation: disabled  Tobacco Use   Smoking status: Never    Passive exposure: Current   Smokeless tobacco: Never  Vaping Use   Vaping status:  Never Used  Substance and Sexual Activity   Alcohol use: Yes    Alcohol/week: 3.0 standard drinks of alcohol    Types: 3 Shots of liquor per week    Comment: 1 pint every other week   Drug use: Yes    Frequency: 1.0 times per week    Types: Marijuana, Cocaine    Comment: Pt has not sued cocaine in 6 months ( as of 04/07/24) twice a week   Sexual activity: Not Currently    Birth control/protection: Surgical  Other Topics Concern   Not on file  Social History Narrative   Not on file   Social Drivers of Health   Financial Resource Strain: Low Risk  (01/03/2024)   Overall Financial Resource Strain (CARDIA)    Difficulty of Paying Living Expenses: Not very hard  Food Insecurity: Food Insecurity Present (03/13/2024)   Hunger Vital Sign    Worried About Running Out of Food in the Last Year: Never true    Ran Out of Food in the Last  Year: Sometimes true  Transportation Needs: No Transportation Needs (03/13/2024)   PRAPARE - Administrator, Civil Service (Medical): No    Lack of Transportation (Non-Medical): No  Physical Activity: Inactive (01/03/2024)   Exercise Vital Sign    Days of Exercise per Week: 0 days    Minutes of Exercise per Session: 0 min  Stress: Stress Concern Present (01/03/2024)   [employer] of Occupational Health - Occupational Stress Questionnaire    Feeling of Stress : Very much  Social Connections: Socially Isolated (01/03/2024)   Social Connection and Isolation Panel [NHANES]    Frequency of Communication with Friends and Family: More than three times a week    Frequency of Social Gatherings with Friends and Family: Once a week    Attends Religious Services: Never    Database administrator or Organizations: No    Attends Engineer, structural: Not on file    Marital Status: Never married  Intimate Partner Violence: Not At Risk (03/13/2024)   Humiliation, Afraid, Rape, and Kick questionnaire    Fear of Current or Ex-Partner: No     Emotionally Abused: No    Physically Abused: No    Sexually Abused: No    FAMILY HISTORY: Family History  Problem Relation Age of Onset   Colon polyps Mother    Hypertension Mother    Stroke Mother    Deep vein thrombosis Mother    Congestive Heart Failure Mother    Arthritis Mother    Asthma Mother    Depression Mother    Other Father        history unknown   Obesity Sister    Diabetes Sister    Obesity Brother    Congestive Heart Failure Maternal Grandmother    Colon cancer Neg Hx    Esophageal cancer Neg Hx    Rectal cancer Neg Hx    Stomach cancer Neg Hx     ALLERGIES:  is allergic to clarithromycin, penicillins, and penicillin g.  MEDICATIONS:  Current Outpatient Medications  Medication Sig Dispense Refill   albuterol  (PROVENTIL ) (2.5 MG/3ML) 0.083% nebulizer solution Take 3 mLs (2.5 mg total) by nebulization every 4 (four) hours as needed for wheezing or shortness of breath. (Patient taking differently: Take 2.5 mg by nebulization daily.) 75 mL 2   albuterol  (VENTOLIN  HFA) 108 (90 Base) MCG/ACT inhaler Inhale 2 puffs into the lungs every 6 (six) hours as needed for wheezing or shortness of breath. 18 g 2   Budeson-Glycopyrrol-Formoterol  (BREZTRI  AEROSPHERE) 160-9-4.8 MCG/ACT AERO Inhale 2 puffs into the lungs in the morning and at bedtime. 10.7 g 5   carvedilol  (COREG ) 12.5 MG tablet Take 1 tablet (12.5 mg total) by mouth 2 (two) times daily with a meal. 60 tablet 11   chlorhexidine  (PERIDEX ) 0.12 % solution Use as directed 15 mLs in the mouth or throat daily.     clindamycin  (CLEOCIN ) 300 MG capsule Take 300 mg by mouth every 8 (eight) hours.     dapagliflozin  propanediol (FARXIGA ) 10 MG TABS tablet Take 1 tablet (10 mg total) by mouth daily before breakfast. 30 tablet 5   DULoxetine (CYMBALTA) 30 MG capsule Take 30 mg by mouth daily.     fluticasone  (FLONASE ) 50 MCG/ACT nasal spray Place 2 sprays into both nostrils daily. (Patient taking differently: Place 2 sprays  into both nostrils daily as needed for allergies or rhinitis.) 48 g 0   furosemide  (LASIX ) 20 MG tablet TAKE ONE TABLET (20 MG) BY  MOUTH DAILY AT 9AM (Patient taking differently: Take 20 mg by mouth daily.) 30 tablet 11   hydrOXYzine  (VISTARIL ) 100 MG capsule Take 100 mg by mouth at bedtime.     ibuprofen (ADVIL) 800 MG tablet Take 800 mg by mouth every 6 (six) hours as needed for fever, headache or mild pain (pain score 1-3).     montelukast  (SINGULAIR ) 10 MG tablet TAKE ONE TABLET (10 MG) BY MOUTH DAILY AT 9PM AT BEDTIME 90 tablet 0   Multiple Vitamin (MULTIVITAMIN) tablet Take 1 tablet by mouth daily.     oxyCODONE  (OXY IR/ROXICODONE ) 5 MG immediate release tablet Take 1 tablet (5 mg total) by mouth every 6 (six) hours as needed for severe pain (pain score 7-10). 15 tablet 0   sacubitril -valsartan  (ENTRESTO ) 49-51 MG TAKE ONE TABLET BY MOUTH TWICE DAILY @ 9AM-5PM 180 tablet 1   spironolactone  (ALDACTONE ) 25 MG tablet TAKE ONE TABLET (25 MG) BY MOUTH DAILY 90 tablet 0   traZODone  (DESYREL ) 150 MG tablet Take 150 mg by mouth at bedtime.     No current facility-administered medications for this visit.    REVIEW OF SYSTEMS:   Constitutional: Denies fevers, chills or abnormal night sweats Eyes: Denies blurriness of vision, double vision or watery eyes Ears, nose, mouth, throat, and face: Denies mucositis or sore throat Respiratory: Denies cough, dyspnea or wheezes Cardiovascular: Denies palpitation, chest discomfort or lower extremity swelling Gastrointestinal:  Denies nausea, heartburn or change in bowel habits Skin: Denies abnormal skin rashes Lymphatics: Denies new lymphadenopathy or easy bruising Neurological:Denies numbness, tingling or new weaknesses Behavioral/Psych: Mood is stable, no new changes  Breast: Denies any palpable lumps or discharge All other systems were reviewed with the patient and are negative.  PHYSICAL EXAMINATION: ECOG PERFORMANCE STATUS: 0 -  Asymptomatic  Vitals:   04/21/24 1010  BP: 112/73  Pulse: 69  Resp: 19  Temp: 97.9 F (36.6 C)  SpO2: 98%    Filed Weights   04/21/24 1010  Weight: 266 lb 11.2 oz (121 kg)     GENERAL:alert, no distress and comfortable   LABORATORY DATA:  I have reviewed the data as listed Lab Results  Component Value Date   WBC 8.6 03/11/2024   HGB 13.7 03/11/2024   HCT 42.0 03/11/2024   MCV 83.3 03/11/2024   PLT 218 03/11/2024   Lab Results  Component Value Date   NA 142 03/11/2024   K 4.3 03/11/2024   CL 108 03/11/2024   CO2 30 03/11/2024    RADIOGRAPHIC STUDIES: I have personally reviewed the radiological reports and agreed with the findings in the report.  ASSESSMENT AND PLAN:  Malignant neoplasm of upper-inner quadrant of left breast in female, estrogen receptor positive (HCC) Assessment and Plan Assessment & Plan Breast cancer with lymph node involvement Recent surgery removed five lymph nodes, two with small foci of cancer. Margins negative. Oncotype testing planned for recurrence risk. Anastrozole therapy post-radiation to reduce estrogen. Discussed anastrozole side effects and cardiovascular risks. - Conduct Oncotype testing to assess recurrence risk. - Initiate anastrozole therapy post-radiation. - Monitor for anastrozole side effects, including hot flashes, vaginal dryness, bone density transient - Discuss potential cardiovascular risks of anastrozole.  Heart failure with reduced ejection fraction Ejection fraction at 30-35%. Chemotherapy not recommended due to heart function concerns and other comorbidities. - she is however interested in knowing her risk of recurrence, hence will proceed with oncotype.  Murleen Arms MD     Time spent: 30 min All questions were answered.  The patient knows to call the clinic with any problems, questions or concerns.    Murleen Arms, MD 04/21/24

## 2024-04-27 NOTE — Progress Notes (Signed)
 [facility] NOTE  Referring Physician: Senaida Dama, NP  Primary Care: Senaida Dama, NP Primary Cardiologist: Ahmad Alert, MD  HPI: Janet [name] is a very pleasant 58 y.o. female with T2DM, asthma, HTN, obesity, hx of substance abuse and HFrEF presenting today to establish care. Janet [name] reports being initially diagnosed with CHF in early 2022 after presenting to the hospital with shortness of breath and chest pressure.  She believes her CHF was brought on by use of crack/cocaine.  Since diagnosis to her LVEF has ranged from 25% to 40%.  While she has worked very hard to control her substance use, she believes that her drops and LV function have been due to unfortunate relapses.  Her last relapse was after the death of a close family member.  Interval hx:  - S/P lumpectomy on 04/08/24 with 3 lymph nodes also removed. 2/6 lymph nodes positive for metastatic carcinoma.  - Planning on starting radiation therapy on 05/09/24.  - From a functional standpoint she is NYHA II with minimal shortness of breath, chest pain, lightheadedness, lower extremity edema, orthopnea or PND.    Current Outpatient Medications  Medication Sig Dispense Refill   albuterol  (PROVENTIL ) (2.5 MG/3ML) 0.083% nebulizer solution Take 3 mLs (2.5 mg total) by nebulization every 4 (four) hours as needed for wheezing or shortness of breath. (Patient taking differently: Take 2.5 mg by nebulization daily.) 75 mL 2   albuterol  (VENTOLIN  HFA) 108 (90 Base) MCG/ACT inhaler Inhale 2 puffs into the lungs every 6 (six) hours as needed for wheezing or shortness of breath. 18 g 2   Budeson-Glycopyrrol-Formoterol  (BREZTRI  AEROSPHERE) 160-9-4.8 MCG/ACT AERO Inhale 2 puffs into the lungs in the morning and at bedtime. 10.7 g 5   carvedilol  (COREG ) 12.5 MG tablet Take 1 tablet (12.5 mg total) by mouth 2 (two) times daily with a meal. 60 tablet 11   dapagliflozin  propanediol (FARXIGA ) 10 MG TABS tablet Take  1 tablet (10 mg total) by mouth daily before breakfast. 30 tablet 5   DULoxetine (CYMBALTA) 30 MG capsule Take 30 mg by mouth daily.     fluticasone  (FLONASE ) 50 MCG/ACT nasal spray Place 2 sprays into both nostrils daily. (Patient taking differently: Place 2 sprays into both nostrils daily as needed for allergies or rhinitis.) 48 g 0   furosemide  (LASIX ) 20 MG tablet TAKE ONE TABLET (20 MG) BY MOUTH DAILY AT 9AM (Patient taking differently: Take 20 mg by mouth daily.) 30 tablet 11   hydrOXYzine  (VISTARIL ) 100 MG capsule Take 100 mg by mouth at bedtime.     ibuprofen (ADVIL) 800 MG tablet Take 800 mg by mouth every 6 (six) hours as needed for fever, headache or mild pain (pain score 1-3).     montelukast  (SINGULAIR ) 10 MG tablet TAKE ONE TABLET (10 MG) BY MOUTH DAILY AT 9PM AT BEDTIME 90 tablet 0   Multiple Vitamin (MULTIVITAMIN) tablet Take 1 tablet by mouth daily.     oxyCODONE  (OXY IR/ROXICODONE ) 5 MG immediate release tablet Take 1 tablet (5 mg total) by mouth every 6 (six) hours as needed for severe pain (pain score 7-10). 15 tablet 0   sacubitril -valsartan  (ENTRESTO ) 97-103 MG Take 1 tablet by mouth 2 (two) times daily. 60 tablet 3   spironolactone  (ALDACTONE ) 25 MG tablet TAKE ONE TABLET (25 MG) BY MOUTH DAILY 90 tablet 0   traZODone  (DESYREL ) 150 MG tablet Take 150 mg by mouth at bedtime.     No current facility-administered medications for  this encounter.      PHYSICAL EXAM: Vitals:   04/28/24 1134  BP: 128/74  Pulse: 74  SpO2: 96%   GENERAL: NAD Lungs-CTA CARDIAC:  JVP: 6 cm          Normal rate with regular rhythm.  No murmur.  Pulses 2+.  No edema.  ABDOMEN: Soft, non-tender, non-distended.  EXTREMITIES: Warm and well perfused.  NEUROLOGIC: No obvious FND   DATA REVIEW  ECG: Sinus rhythm with LVH  ECHO: 05/16/22: LVEF 40%, RV function normal.  09/22/21: LVEF 30-35%, RV normal. LA moderately dilated.  12/14/22: LVEF 35%; normal RV function.   CATH: 04/01/21:  LV  end diastolic pressure is moderately elevated. There is no aortic valve stenosis. Hemodynamic findings consistent with mild pulmonary hypertension. Ao sat 99%, PA sat 73%, mean PA pressure 33 mmHg, mean PCWP 22 mmHg, cardiac output 6.2 L/min, cardiac index 2.6. No angiographically apparent coronary artery disease. Significant tortuosity noted in the RCA and the circumflex.  CMR (08/07/22): 1. Moderate LVE/LVH with abnormal septal motion and global hypokinesis LVEF 23%  2. Mild non specific mid myocardial gadolinium uptake in anterior wall  3.  Low normal RVEF 45%  4.  Tri leaflet AV with mild AR Regurgitant fraction 19%  5.  Mild Ascending thoracic aorta/sinus dilatation 3.8 cm  ASSESSMENT & PLAN:  Stage C, HFrEF, Nonischemic CMP Etiology of WJ:[id number], likely related to subustance abuse LHC 2022  no coronary disease.  NYHA class / AHA Stage:  NYHA II Volume status & Diuretics:   Vasodilators: Increase Entresto  to 97/103 mg twice daily Beta-Blocker: continue Coreg  to 12.5 mg twice daily MRA: Spironolactone  25mg  daily Cardiometabolic: restarted farxiga  this month.  Devices therapies & Valvulopathies: 2024 LVEF 35%; will continue GDMT before pursuing ICD placement.  Advanced therapies:Not indicated 04/28/25: Will uptitrate GDMT; hopeful for recovery of LV function. Hopeful reduced LV function does disqualify her from chemotherapy.   2. HTN - Increase Entresto  to 97/103 mg twice daily  3. Obesity  -Body mass index is 40.17 kg/m. = Plan to start GLP-1 RA in the future  4. Substance abuse - She has not used any drugs in over 8 months now.  Congratulated.  5. L Breast cancer -Now status post lumpectomy with negative margins.  However did have positive sentinel lymph node. -Planning to undergo radiation followed by anastrozole.  Will discuss with Dr. Recoup. -Reviewed hematology/oncology notes from April 21, 2024.  I spent 35 minutes caring for this patient today including face  to face time, ordering and reviewing labs, reviewing records from Dr. Sheral Diener / surgical records / pathology, reviewing echo with her, counseling on importance of continued drug cesation, seeing the patient, documenting in the record, and arranging follow ups.  Hosanna Betley 1:21 PM

## 2024-04-28 ENCOUNTER — Other Ambulatory Visit: Payer: Self-pay | Admitting: Family

## 2024-04-28 ENCOUNTER — Encounter (HOSPITAL_COMMUNITY): Payer: Self-pay | Admitting: Cardiology

## 2024-04-28 ENCOUNTER — Ambulatory Visit (HOSPITAL_COMMUNITY)
Admission: RE | Admit: 2024-04-28 | Discharge: 2024-04-28 | Disposition: A | Discharge status: Home / Self Care | Source: Ambulatory Visit | Attending: Cardiology | Admitting: Cardiology

## 2024-04-28 VITALS — BP 128/74 | HR 74 | Ht 68.0 in | Wt 264.2 lb

## 2024-04-28 DIAGNOSIS — E669 Obesity, unspecified: Secondary | ICD-10-CM | POA: Diagnosis not present

## 2024-04-28 DIAGNOSIS — Z170 Estrogen receptor positive status [ER+]: Secondary | ICD-10-CM | POA: Diagnosis not present

## 2024-04-28 DIAGNOSIS — I5032 Chronic diastolic (congestive) heart failure: Secondary | ICD-10-CM

## 2024-04-28 DIAGNOSIS — C50912 Malignant neoplasm of unspecified site of left female breast: Secondary | ICD-10-CM | POA: Insufficient documentation

## 2024-04-28 DIAGNOSIS — I10 Essential (primary) hypertension: Secondary | ICD-10-CM

## 2024-04-28 DIAGNOSIS — I110 Hypertensive heart disease with heart failure: Secondary | ICD-10-CM | POA: Diagnosis not present

## 2024-04-28 DIAGNOSIS — F1410 Cocaine abuse, uncomplicated: Secondary | ICD-10-CM | POA: Insufficient documentation

## 2024-04-28 DIAGNOSIS — C50212 Malignant neoplasm of upper-inner quadrant of left female breast: Secondary | ICD-10-CM

## 2024-04-28 DIAGNOSIS — J45909 Unspecified asthma, uncomplicated: Secondary | ICD-10-CM | POA: Insufficient documentation

## 2024-04-28 DIAGNOSIS — I5022 Chronic systolic (congestive) heart failure: Secondary | ICD-10-CM | POA: Insufficient documentation

## 2024-04-28 DIAGNOSIS — I428 Other cardiomyopathies: Secondary | ICD-10-CM | POA: Diagnosis not present

## 2024-04-28 DIAGNOSIS — Z7984 Long term (current) use of oral hypoglycemic drugs: Secondary | ICD-10-CM | POA: Insufficient documentation

## 2024-04-28 DIAGNOSIS — Z6841 Body Mass Index (BMI) 40.0 and over, adult: Secondary | ICD-10-CM | POA: Insufficient documentation

## 2024-04-28 DIAGNOSIS — F1910 Other psychoactive substance abuse, uncomplicated: Secondary | ICD-10-CM

## 2024-04-28 DIAGNOSIS — E119 Type 2 diabetes mellitus without complications: Secondary | ICD-10-CM | POA: Insufficient documentation

## 2024-04-28 DIAGNOSIS — C773 Secondary and unspecified malignant neoplasm of axilla and upper limb lymph nodes: Secondary | ICD-10-CM | POA: Insufficient documentation

## 2024-04-28 LAB — BASIC METABOLIC PANEL WITH GFR
Anion gap: 6 (ref 5–15)
BUN: 14 mg/dL (ref 6–20)
CO2: 28 mmol/L (ref 22–32)
Calcium: 8.9 mg/dL (ref 8.9–10.3)
Chloride: 104 mmol/L (ref 98–111)
Creatinine, Ser: 1.17 mg/dL — ABNORMAL HIGH (ref 0.44–1.00)
GFR, Estimated: 55 mL/min — ABNORMAL LOW (ref >60)
Glucose, Bld: 106 mg/dL — ABNORMAL HIGH (ref 70–99)
Potassium: 4.1 mmol/L (ref 3.5–5.1)
Sodium: 138 mmol/L (ref 135–145)

## 2024-04-28 LAB — BRAIN NATRIURETIC PEPTIDE: B Natriuretic Peptide: 80.3 pg/mL (ref 0.0–100.0)

## 2024-04-28 MED ORDER — ENTRESTO 97-103 MG PO TABS: 1.0000 | ORAL_TABLET | Freq: Two times a day (BID) | ORAL | 3 refills | Status: DC

## 2024-04-28 NOTE — Patient Instructions (Signed)
 Medication Changes:  INCREASE ENTRESTO  TO 97/103MG  TWICE DAILY   Lab Work:  Labs done today, your results will be available in MyChart, we will contact you for abnormal readings.  Follow-Up in: 3 MONTHS WITH AN ECHO PLEASE CALL OUR OFFICE AROUND JULY TO GET SCHEDULED FOR YOUR APPOINTMENT. PHONE NUMBER IS [phone] OPTION 2   At the [facility], you and your health needs are our priority. We have a designated team specialized in the treatment of Heart Failure. This Care Team includes your primary Heart Failure Specialized Cardiologist (physician), Advanced Practice Providers (APPs- Physician Assistants and Nurse Practitioners), and Pharmacist who all work together to provide you with the care you need, when you need it.   You may see any of the following providers on your designated Care Team at your next follow up:  Dr. Jules Oar Dr. Peder Bourdon Dr. Alwin Baars Dr. Judyth Nunnery Nieves Bars, NP Ruddy Corral, [place] [facility] [place], [place] Dennise Fitz, NP [place] Lee, NP Luster Salters, PharmD   Please be sure to bring in all your medications bottles to every appointment.   Need to Contact Us :  If you have any questions or concerns before your next appointment please send us  a message through [place] or call our office at [phone].    TO LEAVE A MESSAGE FOR THE NURSE SELECT OPTION 2, PLEASE LEAVE A MESSAGE INCLUDING: YOUR NAME DATE OF BIRTH CALL BACK NUMBER REASON FOR CALL**this is important as we prioritize the call backs  YOU WILL RECEIVE A CALL BACK THE SAME DAY AS LONG AS YOU CALL BEFORE 4:00 PM

## 2024-04-28 NOTE — Telephone Encounter (Signed)
 Pt is currently seeking a new therapist/psychiatrist bc of insurance no longer covering her current provider.   Copied from CRM [id number]. Topic: Clinical - Medication Refill >> Apr 28, 2024  1:19 PM Dominique E wrote: Medication: traZODone  (DESYREL ) 150 MG tablet  hydrOXYzine  (VISTARIL ) 100 MG capsule  Has the patient contacted their pharmacy? Yes (Agent: If no, request that the patient contact the pharmacy for the refill. If patient does not wish to contact the pharmacy document the reason why and proceed with request.) (Agent: If yes, when and what did the pharmacy advise?)  This is the patient's preferred pharmacy:   [id number] - [place], [place] - [address] AT [facility] OF [address] [place] [address] Phone: [phone] Fax: [phone]  Is this the correct pharmacy for this prescription? Yes If no, delete pharmacy and type the correct one.   Has the prescription been filled recently? Yes  Is the patient out of the medication? Yes  Has the patient been seen for an appointment in the last year OR does the patient have an upcoming appointment? Yes  Can we respond through MyChart? Yes  Agent: Please be advised that Rx refills may take up to 3 business days. We ask that you follow-up with your pharmacy.

## 2024-04-29 ENCOUNTER — Encounter: Payer: Self-pay | Admitting: Rehabilitation

## 2024-04-29 ENCOUNTER — Ambulatory Visit: Payer: Self-pay | Attending: Hematology and Oncology | Admitting: Rehabilitation

## 2024-04-29 DIAGNOSIS — Z9189 Other specified personal risk factors, not elsewhere classified: Secondary | ICD-10-CM | POA: Diagnosis not present

## 2024-04-29 DIAGNOSIS — C50212 Malignant neoplasm of upper-inner quadrant of left female breast: Secondary | ICD-10-CM | POA: Diagnosis not present

## 2024-04-29 DIAGNOSIS — Z170 Estrogen receptor positive status [ER+]: Secondary | ICD-10-CM | POA: Insufficient documentation

## 2024-04-29 DIAGNOSIS — Z483 Aftercare following surgery for neoplasm: Secondary | ICD-10-CM | POA: Diagnosis not present

## 2024-04-29 DIAGNOSIS — R293 Abnormal posture: Secondary | ICD-10-CM | POA: Diagnosis not present

## 2024-04-29 NOTE — Telephone Encounter (Signed)
 Trazodone  and Hydroxyzine  appear as historical medications. Schedule appointment.

## 2024-04-29 NOTE — Therapy (Signed)
 OUTPATIENT PHYSICAL THERAPY BREAST CANCER POST OP FOLLOW UP   Patient Name: Janet [name] MRN: [id number] DOB:[birth year], 58 y.o., female Today's Date: 04/29/2024  END OF SESSION:  PT End of Session - 04/29/24 1221     Visit Number 2    Number of Visits 2    PT Start Time 1155    PT Stop Time 1221    PT Time Calculation (min) 26 min    Activity Tolerance Patient tolerated treatment well    Behavior During Therapy WFL for tasks assessed/performed             Past Medical History:  Diagnosis Date   Allergy    Anxiety    Arthritis    Asthma    Breast cancer (HCC) 02/28/2024   CHF (congestive heart failure) (HCC)    Congestive heart failure (CHF) (HCC)    Depression    Elevated brain natriuretic peptide (BNP) level    Hypertension    Lymphedema    Nonischemic cardiomyopathy (HCC) 03/2021   Pre-diabetes    SOB (shortness of breath)    Substance abuse (HCC)    Past Surgical History:  Procedure Laterality Date   ABDOMINAL HYSTERECTOMY     BREAST BIOPSY Right    BREAST BIOPSY Left 10/16/2023   times 3   BREAST BIOPSY Left 10/16/2023   US  LT BREAST BX W LOC DEV EA ADD LESION IMG BX SPEC US  GUIDE 10/16/2023 [facility]   BREAST BIOPSY Left 10/16/2023   US  LT BREAST BX W LOC DEV 1ST LESION IMG BX SPEC US  GUIDE 10/16/2023 [facility]   BREAST BIOPSY Left 10/16/2023   US  LT BREAST BX W LOC DEV EA ADD LESION IMG BX SPEC US  GUIDE 10/16/2023 [facility]   BREAST BIOPSY Left 02/27/2024   US  LT RADIOACTIVE SEED LOC 02/27/2024 [facility]   BREAST BIOPSY Left 02/27/2024   US  LT RADIOACTIVE SEED EA ADD LESION 02/27/2024 [facility]   BREAST LUMPECTOMY WITH RADIOACTIVE SEED AND SENTINEL LYMPH NODE BIOPSY Left 04/08/2024   Procedure: BREAST LUMPECTOMY WITH RADIOACTIVE SEED AND SENTINEL LYMPH NODE BIOPSY;  Surgeon: Sim Dryer, MD;  Location: [facility];  Service: General;  Laterality: Left;  LEFT BREAST RE-EXCISION LUMPECTOMY, LEFT SENTINEL LYMPH  NODE MAPPING   BREAST LUMPECTOMY WITH RADIOACTIVE SEED LOCALIZATION Left 02/28/2024   Procedure: LEFT BREAST SEED LUMPECTOMY;  Surgeon: Sim Dryer, MD;  Location: [facility];  Service: General;  Laterality: Left;  3 seeds   CESAREAN SECTION     COLONOSCOPY WITH PROPOFOL  N/A 12/10/2023   Procedure: COLONOSCOPY WITH PROPOFOL ;  Surgeon: Ace Holder, MD;  Location: [facility];  Service: Gastroenterology;  Laterality: N/A;   HEMOSTASIS CLIP PLACEMENT  12/10/2023   Procedure: HEMOSTASIS CLIP PLACEMENT;  Surgeon: Ace Holder, MD;  Location: [facility];  Service: Gastroenterology;;   HERNIA REPAIR     umbilical as a child   POLYPECTOMY  12/10/2023   Procedure: POLYPECTOMY;  Surgeon: Ace Holder, MD;  Location: [facility];  Service: Gastroenterology;;   RIGHT/LEFT HEART CATH AND CORONARY ANGIOGRAPHY N/A 04/01/2021   Procedure: RIGHT/LEFT HEART CATH AND CORONARY ANGIOGRAPHY;  Surgeon: Lucendia Rusk, MD;  Location: [facility];  Service: Cardiovascular;  Laterality: N/A;   TUBAL LIGATION     Patient Active Problem List   Diagnosis Date Noted   Malignant neoplasm of upper-inner quadrant of left breast in female, estrogen receptor positive (HCC) 03/11/2024   Colon cancer screening 12/10/2023   Benign neoplasm of  colon 12/10/2023   Vitamin D  deficiency 09/04/2023   Prediabetes 08/03/2022   Acute systolic heart failure (HCC)    Essential hypertension 02/17/2021   Asthma    Lymphedema    Elevated brain natriuretic peptide (BNP) level    DOE (dyspnea on exertion) 07/28/2020   Morbid obesity (HCC) 07/28/2020    PCP: Lavona Pounds, NP  REFERRING PROVIDER: Murleen Arms, MD  REFERRING DIAG: left breast cancer   THERAPY DIAG:  Malignant neoplasm of upper-inner quadrant of left breast in female, estrogen receptor positive (HCC)  Abnormal posture  Aftercare following surgery for neoplasm  At risk for lymphedema  Rationale for Evaluation and  Treatment: Rehabilitation  ONSET DATE: 03/11/24  SUBJECTIVE:                                                                                                                                                                                           SUBJECTIVE STATEMENT: I am a little sore.  It is pretty swollen in the armpit.  I never got a bra.  I see the surgeon Friday.    PERTINENT HISTORY:  Patient was diagnosed on 4/22/5 with left papillary carcinoma after lumpectomy for papilloma removal which ended up malignant. It measures 5.1 cm. It is ER/PR positive and HER2 negative with a Ki67 of 20%. surgery for margin clearance and SLNB on 04/08/24 with 2/5 nodes positive. My radiation simulation in 05/08/24.  Then antiestrogens.  Chemo depending on how the heart is doing.  Other hx includes CHF with EF of 30-35% and cardiomyopathy.  Pt has primary lymphedema in both legs that started in 10th grade.  Worse after pregnancy.  Wears compression.   PATIENT GOALS:  Reassess how my recovery is going related to arm function, pain, and swelling.  PAIN:  Are you having pain? Yes: NPRS scale: 6/10 Pain location: axilla Pain description: achy and swollen Aggravating factors: moving too much  Relieving factors: using the pillow.  Sometimes oxycodone .    PRECAUTIONS: Recent Surgery, left UE Lymphedema risk  RED FLAGS: None   ACTIVITY LEVEL / LEISURE: I've been doing housework again and I made my bed.     OBJECTIVE:   PATIENT SURVEYS:  QUICK DASH: 22% limited From 27% limited   OBSERVATIONS: Seroma pocket noted at incision with a bit of a bright redness. See media. Discussed how I will send info over to Dr. Afton Horse.    POSTURE:   Rounded shoulders, forward head  LYMPHEDEMA ASSESSMENT:   UPPER EXTREMITY AROM/PROM:   A/PROM RIGHT   eval    Shoulder extension 65  Shoulder flexion 145  Shoulder abduction 165  Shoulder  internal rotation 50  Shoulder external rotation 115                           (Blank rows = not tested)   A/PROM LEFT   eval 04/29/24  Shoulder extension 60 60  Shoulder flexion 155 150  Shoulder abduction 153 153  Shoulder internal rotation 95 90  Shoulder external rotation 70 90                          (Blank rows = not tested)   CERVICAL AROM: All within normal limits:    UPPER EXTREMITY STRENGTH: 5/5 bil no pain   LYMPHEDEMA ASSESSMENTS (in cm):    LANDMARK RIGHT   eval  10 cm proximal to olecranon process 41.5  Olecranon process 31.5  10 cm proximal to ulnar styloid process 24.7  Just proximal to ulnar styloid process 19.5  Across hand at thumb web space 20.9  At base of 2nd digit 6.8  (Blank rows = not tested)   LANDMARK LEFT   eval  10 cm proximal to olecranon process 45  Olecranon process 32  10 cm proximal to ulnar styloid process 27.3  Just proximal to ulnar styloid process 21.6  Across hand at thumb web space 21.0  At base of 2nd digit 6.8  (Blank rows = not tested)   PATIENT EDUCATION:  Education details: per below and regarding possible need for seroma/infection intervention , gave new handout on exercises, gave bra prescription Person educated: Patient Education method: Explanation, Demonstration, Tactile cues, Verbal cues, and Handouts Education comprehension: verbalized understanding  HOME EXERCISE PROGRAM: Reviewed previously given post op HEP.   ASSESSMENT:  CLINICAL IMPRESSION: Pt returns after subsequent lumpectomy and SLNB with 2/5 nodes positive.  She appears to have a seroma in the axilla at this incision with some redness present and 6/10 pain.  A note will be sent to Dr. Afton Horse about these findings.  Despite this her ROM is doing very well.  We reviewed lymphedema risk, stretching during radiation, SOZO use, and when to return to PT.   Pt will benefit from skilled therapeutic intervention to improve on the following deficits: Decreased knowledge of precautions, impaired UE functional use, pain, decreased ROM,  postural dysfunction.   PT treatment/interventions: ADL/Self care home management, 97110-Therapeutic exercises, 97530- Therapeutic activity, 97535- Self Care, and 81191- Manual therapy   GOALS: Goals reviewed with patient? Yes  LONG TERM GOALS:  (STG=LTG)  GOALS Name Target Date  Goal status  1 Pt will demonstrate she has regained full shoulder ROM and function post operatively compared to baselines.  Baseline: 04/29/24 MET                    PLAN:  PT FREQUENCY/DURATION: SOZO only  PLAN FOR NEXT SESSION: SOZO only    [facility]  [address], [address]  [place] [address]  [phone]  After Breast Cancer Class Video It is recommended you view the ABC class video to be educated on lymphedema risk reduction. This video lasts for about 30 minutes. It can be viewed on our website here: [email or web address]  Scar massage You can begin gentle scar massage to you incision sites. Gently place one hand on the incision and move the skin (without sliding on the skin) in various directions. Do this for a few minutes and then you can gently massage either coconut oil or vitamin  E cream into the scars.  Compression garment You should continue wearing your compression bra until you feel like you no longer have swelling.  Home exercise Program Continue doing the exercises you were given until you feel like you can do them without feeling any tightness at the end.   Walking Program Studies show that 30 minutes of walking per day (fast enough to elevate your heart rate) can significantly reduce the risk of a cancer recurrence. If you can't walk due to other medical reasons, we encourage you to find another activity you could do (like a stationary bike or water exercise).  Posture After breast cancer surgery, people frequently sit with rounded shoulders posture because it puts their incisions on  slack and feels better. If you sit like this and scar tissue forms in that position, you can become very tight and have pain sitting or standing with good posture. Try to be aware of your posture and sit and stand up tall to heal properly.  Follow up PT: It is recommended you return every 3 months for the first 3 years following surgery to be assessed on the SOZO machine for an L-Dex score. This helps prevent clinically significant lymphedema in 95% of patients. These follow up screens are 10 minute appointments that you are not billed for.  Encarnacion Harris, PT 04/29/2024, 12:22 PM  PHYSICAL THERAPY DISCHARGE SUMMARY  Visits from Start of Care: 2  Current functional level related to goals / functional outcomes: Per above   Remaining deficits: Seroma, lymphedema risk    Education / Equipment: Final HEP  Plan: Patient agrees to discharge.   Patient is being discharged due to meeting the stated rehab goals.

## 2024-04-29 NOTE — Telephone Encounter (Signed)
 Requested medication (s) are due for refill today: historical medications  Requested medication (s) are on the active medication list: yes   Last refill:  na   Future visit scheduled: no   Notes to clinic:  historical medications. Do you want to order Rxs?     Requested Prescriptions  Pending Prescriptions Disp Refills   traZODone  (DESYREL ) 150 MG tablet      Sig: Take 1 tablet (150 mg total) by mouth at bedtime.     Psychiatry: Antidepressants - Serotonin Modulator Passed - 04/29/2024 12:48 PM      Passed - Valid encounter within last 6 months    Recent Outpatient Visits           4 months ago Hospital discharge follow-up   [facility], Amy J, NP   6 months ago Moderate persistent asthma with acute exacerbation   [place] [facility], MD   7 months ago Encounter for screening mammogram for malignant neoplasm of breast   [place] [facility], [place], NP   1 year ago Annual physical exam   [facility], Amy J, NP   1 year ago Lipoma of right upper extremity   [place] [facility] at [facility], Amy J, NP               hydrOXYzine  (VISTARIL ) 100 MG capsule 30 capsule     Sig: Take 1 capsule (100 mg total) by mouth at bedtime.     Ear, Nose, and Throat:  Antihistamines 2 Failed - 04/29/2024 12:48 PM      Failed - Cr in normal range and within 360 days    Creatinine  Date Value Ref Range Status  03/11/2024 1.15 (H) 0.44 - 1.00 mg/dL Final   Creatinine, Ser  Date Value Ref Range Status  04/28/2024 1.17 (H) 0.44 - 1.00 mg/dL Final         Passed - Valid encounter within last 12 months    Recent Outpatient Visits           4 months ago Hospital discharge follow-up   [facility], Amy J, NP   6 months ago Moderate persistent asthma with acute exacerbation   [facility], MD   7 months ago Encounter for screening mammogram for malignant neoplasm of breast   [place] [facility], [place], NP   1 year ago Annual physical exam   [place] [facility] at [facility], Amy J, NP   1 year ago Lipoma of right upper extremity   [place] [facility] at [facility], Annalee Barren, NP

## 2024-04-30 NOTE — Telephone Encounter (Signed)
 Trazodone  and Hydroxyzine  appear as historical medications. Schedule appointment.

## 2024-05-01 ENCOUNTER — Encounter: Payer: Self-pay | Admitting: *Deleted

## 2024-05-02 NOTE — Progress Notes (Signed)
 New Breast Cancer Diagnosis: Mass of upper outer quadrant of left breast   Did patient present with symptoms (if so, please note symptoms) or screening mammography?: Yes-The patient had a palpable and tender lump that she noted in her left breast, and presented in November 2024 for mammography. Her diagnostic workup indicated 2-3 oval masses in total measuring 4.2 cm and 1-2 masses just medial to this.  Location and Extent of disease : Left breast. Located at the 6 o'clock position, measured 4.2 cm in greatest dimension.   Histology per Pathology Report: grade , FINAL MICROSCOPIC DIAGNOSIS: A. BREAST, LEFT MEDIAL MARGIN, RE-EXCISION:      Benign breast tissue with extensive fibrocystic changes.      Granulation tissue and fat necrosis consistent with prior procedure changes.      Negative for atypia or malignancy. B. BREAST, LEFT LATERAL MARGIN, RE-EXCISION:      Benign breast tissue with fibrocystic changes, usual ductal hyperplasia and sclerosing adenosis.      Negative for atypia or malignancy. C. BREAST, LEFT POSTERIOR MARGIN, RE-EXCISION:      Fibroadenoma (1 mm). Benign breast tissue with fibrocystic changes, sclerosing adenosis and focal usual ductal hyperplasia.      Granulation tissue and fat necrosis consistent with prior procedure changes.      Negative for atypia or malignancy. D. BREAST, LEFT ANTERIOR MARGIN, RE-EXCISION:      Intraductal papillomas (3 mm, 2 mm, 2 mm, 2 mm, 1.5 mm, 1 mm).      Fibroadenomas (4 mm, 2 mm, 1 mm, 1 mm). Benign breast tissue with fibrocystic changes and usual ductal hyperplasia. Granulation tissue and fat necrosis consistent with prior procedure changes.      Negative for atypia or malignancy. E. BREAST, LEFT INFERIOR MARGIN, RE-EXCISION:      Benign breast tissue with extensive fibrocystic changes.      Negative for atypia or malignancy. F. BREAST, LEFT SUPERIOR MARGIN, RE-EXCISION:      Focal residual invasive ductal carcinoma with papillary  features (3 mm).      Lymphovascular invasion identified. Intraductal papillomas (2 mm ,1.5 mm, 1 mm).      Background breast tissue with fibrocystic changes, usual ductal hyperplasia and sclerosing adenosis.      New margin is negative for carcinoma (1.8 mm from new margin).      See comment. G. LYMPH NODE, LEFT AXILLARY, SENTINEL, EXCISION:      One lymph node, positive for metastatic carcinoma (1/1).      Metastatic focus: 5 mm.      No extranodal extension identified.      Lymphovascular invasion identified. H. LYMPH NODE, LEFT AXILLARY, SENTINEL, EXCISION:      One lymph node, positive for metastatic carcinoma (1/1).      Metastatic focus: 3 mm.      No extranodal extension identified.      Lymphovascular invasion identified. I. LYMPH NODE, LEFT AXILLARY, SENTINEL, EXCISION:      One lymph node, negative for metastatic carcinoma (0/1). J. LYMPH NODE, LEFT AXILLARY, SENTINEL, EXCISION:      One lymph node, negative for metastatic carcinoma (0/1). K. LYMPH NODE, LEFT AXILLARY, SENTINEL, EXCISION:      One lymph node, negative for metastatic carcinoma (0/1).   Receptor Status: ER(positive), PR (positive), Her2-neu (negative), Ki-(20%)  Surgeon and surgical plan, if any:  Dr. Afton Horse -Left breast seed lumpectomy 04/08/2024 -Left Breast Lumpectomy with radioactive seed and SLN biopsy 04/08/2024  Medical oncologist, treatment if any: Dr. Arno Bibles Assessment &  Plan Breast cancer with lymph node involvement Recent surgery removed five lymph nodes, two with small foci of cancer. Margins negative. Oncotype testing planned for recurrence risk. Anastrozole therapy post-radiation to reduce estrogen. Discussed anastrozole side effects and cardiovascular risks. - Conduct Oncotype testing to assess recurrence risk. - Initiate anastrozole therapy post-radiation. - Monitor for anastrozole side effects, including hot flashes, vaginal dryness, bone density transient - Discuss potential  cardiovascular risks of anastrozole.  Family History of Breast/Ovarian/Prostate Cancer: None.  Lymphedema issues, if any: None  Pain issues, if any:  None  SAFETY ISSUES: Prior radiation? No Pacemaker/ICD? No Possible current pregnancy? Hysterectomy Is the patient on methotrexate? No  Current Complaints / other details:  ***  This concludes the interaction.  Avery Bodo, LPN

## 2024-05-05 ENCOUNTER — Inpatient Hospital Stay: Admitting: Licensed Clinical Social Worker

## 2024-05-05 DIAGNOSIS — Z170 Estrogen receptor positive status [ER+]: Secondary | ICD-10-CM | POA: Diagnosis not present

## 2024-05-05 DIAGNOSIS — C50212 Malignant neoplasm of upper-inner quadrant of left female breast: Secondary | ICD-10-CM | POA: Diagnosis not present

## 2024-05-05 NOTE — Progress Notes (Incomplete)
 Radiation Oncology         [phone] ________________________________  Name: Janet [name]        MRN: [id number]  Date of Service: 05/08/2024 DOB: Aug 05, 1966  VH:[id number], Annalee Barren, NP  Senaida Dama, NP     REFERRING PHYSICIAN: Senaida Dama, NP   DIAGNOSIS: There were no encounter diagnoses.   HISTORY OF PRESENT ILLNESS: Janet [name] is a 58 y.o. female with a diagnosis of breast cancer.  The patient had a palpable and tender lump that she noted in her left breast and presented in November 2024 for mammography.  Her diagnostic workup indicated 2-3 oval masses in total measuring 4.2 cm and 1-2 masses just medial to this.  By ultrasound on 10/11/2023, in the 6 o'clock position of the left breast there was a lobulated elongated mass measuring 4.2 cm in greatest dimension and in the 8 o'clock position a second area measuring 1.1 cm.  In the 9 o'clock position 1/3 area was identified measuring up to 1.3 cm and a fourth area in the 10 o'clock position showed 2 adjacent hypoechoic masses together measuring 1 cm.  Multiple normal-appearing lymph nodes were seen in her left axilla without concern for any suspicious nodes.  She was counseled on biopsies and on 10/16/2023 the 6:00 lesion was biopsied showing intraductal papilloma with usual ductal hyperplasia.  The 8:00 lesion biopsy showed fibroadenomatoid changes negative for malignancy, and the 10 o'clock position usual breast tissue with fibrocystic change including fibrosis cystic changes were noted.  She was counseled on the rationale to consider surgical resection of her papillomata's and fibrocystic disease and met with Dr. Afton Horse to discuss this.  She opted to proceed on 02/28/24 with a left breast lumpectomy.  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  Her margins were positive posteriorly and medially for invasive disease and 1 mm to the  superior and anterior margin for DCIS.  Her tumor was ER/PR positive, HER2 negative with a Ki-67 of 20%.    Since her last visit, the patient has undergone reexcision of her margins on 04/08/2024 with Dr. Afton Horse. Her medial, lateral, posterior, anterior, and inferior specimens were negative for malignancy and showed fibrocystic, papillomata's and granulation type tissue changes.  The left superior margin resection showed focal residual invasive ductal carcinoma with papillary features measuring 3 mm with intraductal papillomas in the background of fibrocystic change.  The new margin was 1.8 mm.  She also had 5 sentinel lymph nodes removed, 2 of which contained metastatic disease.  An Oncotype DX score was requested and resulted at***. She's seen to consider radiotherapy.    PREVIOUS RADIATION THERAPY: No   PAST MEDICAL HISTORY:  Past Medical History:  Diagnosis Date   Allergy    Anxiety    Arthritis    Asthma    Breast cancer (HCC) 02/28/2024   CHF (congestive heart failure) (HCC)    Congestive heart failure (CHF) (HCC)    Depression    Elevated brain natriuretic peptide (BNP) level    Hypertension    Lymphedema    Nonischemic cardiomyopathy (HCC) 03/2021   Pre-diabetes    SOB (shortness of breath)    Substance abuse (HCC)        PAST SURGICAL HISTORY: Past Surgical History:  Procedure Laterality Date   ABDOMINAL HYSTERECTOMY     BREAST BIOPSY Right    BREAST BIOPSY Left 10/16/2023   times 3  BREAST BIOPSY Left 10/16/2023   US  LT BREAST BX W LOC DEV EA ADD LESION IMG BX SPEC US  GUIDE 10/16/2023 [facility]   BREAST BIOPSY Left 10/16/2023   US  LT BREAST BX W LOC DEV 1ST LESION IMG BX SPEC US  GUIDE 10/16/2023 [facility]   BREAST BIOPSY Left 10/16/2023   US  LT BREAST BX W LOC DEV EA ADD LESION IMG BX SPEC US  GUIDE 10/16/2023 [facility]   BREAST BIOPSY Left 02/27/2024   US  LT RADIOACTIVE SEED LOC 02/27/2024 [facility]   BREAST BIOPSY Left 02/27/2024    US  LT RADIOACTIVE SEED EA ADD LESION 02/27/2024 [facility]   BREAST LUMPECTOMY WITH RADIOACTIVE SEED AND SENTINEL LYMPH NODE BIOPSY Left 04/08/2024   Procedure: BREAST LUMPECTOMY WITH RADIOACTIVE SEED AND SENTINEL LYMPH NODE BIOPSY;  Surgeon: Sim Dryer, MD;  Location: [facility];  Service: General;  Laterality: Left;  LEFT BREAST RE-EXCISION LUMPECTOMY, LEFT SENTINEL LYMPH NODE MAPPING   BREAST LUMPECTOMY WITH RADIOACTIVE SEED LOCALIZATION Left 02/28/2024   Procedure: LEFT BREAST SEED LUMPECTOMY;  Surgeon: Sim Dryer, MD;  Location: [facility];  Service: General;  Laterality: Left;  3 seeds   CESAREAN SECTION     COLONOSCOPY WITH PROPOFOL  N/A 12/10/2023   Procedure: COLONOSCOPY WITH PROPOFOL ;  Surgeon: Ace Holder, MD;  Location: [facility];  Service: Gastroenterology;  Laterality: N/A;   HEMOSTASIS CLIP PLACEMENT  12/10/2023   Procedure: HEMOSTASIS CLIP PLACEMENT;  Surgeon: Ace Holder, MD;  Location: [facility];  Service: Gastroenterology;;   HERNIA REPAIR     umbilical as a child   POLYPECTOMY  12/10/2023   Procedure: POLYPECTOMY;  Surgeon: Ace Holder, MD;  Location: [facility];  Service: Gastroenterology;;   RIGHT/LEFT HEART CATH AND CORONARY ANGIOGRAPHY N/A 04/01/2021   Procedure: RIGHT/LEFT HEART CATH AND CORONARY ANGIOGRAPHY;  Surgeon: Lucendia Rusk, MD;  Location: [facility] INVASIVE CV LAB;  Service: Cardiovascular;  Laterality: N/A;   TUBAL LIGATION       FAMILY HISTORY:  Family History  Problem Relation Age of Onset   Colon polyps Mother    Hypertension Mother    Stroke Mother    Deep vein thrombosis Mother    Congestive Heart Failure Mother    Arthritis Mother    Asthma Mother    Depression Mother    Other Father        history unknown   Obesity Sister    Diabetes Sister    Obesity Brother    Congestive Heart Failure Maternal Grandmother    Colon cancer Neg Hx    Esophageal cancer Neg Hx    Rectal cancer Neg Hx    Stomach  cancer Neg Hx      SOCIAL HISTORY:  reports that she has never smoked. She has been exposed to tobacco smoke. She has never used smokeless tobacco. She reports current alcohol use of about 3.0 standard drinks of alcohol per week. She reports current drug use. Frequency: 1.00 time per week. Drugs: Marijuana and Cocaine.  And other notes it indicates that the patient drinks 1 pint of liquor per week and she has not used crack cocaine in approximately 6 months time.  The patient is single and lives in [place].  She lives with her daughter and two young granddaughters.    ALLERGIES: Clarithromycin, Penicillins, and Penicillin g   MEDICATIONS:  Current Outpatient Medications  Medication Sig Dispense Refill   albuterol  (PROVENTIL ) (2.5 MG/3ML) 0.083% nebulizer solution Take 3 mLs (2.5 mg total) by nebulization every  4 (four) hours as needed for wheezing or shortness of breath. (Patient taking differently: Take 2.5 mg by nebulization daily.) 75 mL 2   albuterol  (VENTOLIN  HFA) 108 (90 Base) MCG/ACT inhaler Inhale 2 puffs into the lungs every 6 (six) hours as needed for wheezing or shortness of breath. 18 g 2   Budeson-Glycopyrrol-Formoterol  (BREZTRI  AEROSPHERE) 160-9-4.8 MCG/ACT AERO Inhale 2 puffs into the lungs in the morning and at bedtime. 10.7 g 5   carvedilol  (COREG ) 12.5 MG tablet Take 1 tablet (12.5 mg total) by mouth 2 (two) times daily with a meal. 60 tablet 11   dapagliflozin  propanediol (FARXIGA ) 10 MG TABS tablet Take 1 tablet (10 mg total) by mouth daily before breakfast. 30 tablet 5   DULoxetine (CYMBALTA) 30 MG capsule Take 30 mg by mouth daily.     fluticasone  (FLONASE ) 50 MCG/ACT nasal spray Place 2 sprays into both nostrils daily. (Patient taking differently: Place 2 sprays into both nostrils daily as needed for allergies or rhinitis.) 48 g 0   furosemide  (LASIX ) 20 MG tablet TAKE ONE TABLET (20 MG) BY MOUTH DAILY AT 9AM (Patient taking differently: Take 20 mg by mouth daily.) 30  tablet 11   hydrOXYzine  (VISTARIL ) 100 MG capsule Take 100 mg by mouth at bedtime.     ibuprofen (ADVIL) 800 MG tablet Take 800 mg by mouth every 6 (six) hours as needed for fever, headache or mild pain (pain score 1-3).     montelukast  (SINGULAIR ) 10 MG tablet TAKE ONE TABLET (10 MG) BY MOUTH DAILY AT 9PM AT BEDTIME 90 tablet 0   Multiple Vitamin (MULTIVITAMIN) tablet Take 1 tablet by mouth daily.     oxyCODONE  (OXY IR/ROXICODONE ) 5 MG immediate release tablet Take 1 tablet (5 mg total) by mouth every 6 (six) hours as needed for severe pain (pain score 7-10). 15 tablet 0   sacubitril -valsartan  (ENTRESTO ) 97-103 MG Take 1 tablet by mouth 2 (two) times daily. 60 tablet 3   spironolactone  (ALDACTONE ) 25 MG tablet TAKE ONE TABLET (25 MG) BY MOUTH DAILY 90 tablet 0   traZODone  (DESYREL ) 150 MG tablet Take 150 mg by mouth at bedtime.     No current facility-administered medications for this encounter.     REVIEW OF SYSTEMS: On review of systems, the patient reports that she is ***     PHYSICAL EXAM:  Wt Readings from Last 3 Encounters:  04/28/24 264 lb 3.2 oz (119.8 kg)  04/21/24 266 lb 11.2 oz (121 kg)  04/08/24 272 lb (123.4 kg)   Temp Readings from Last 3 Encounters:  04/21/24 97.9 F (36.6 C) (Temporal)  04/08/24 97.8 F (36.6 C)  04/07/24 98.5 F (36.9 C)   BP Readings from Last 3 Encounters:  04/28/24 128/74  04/21/24 112/73  04/08/24 123/84   Pulse Readings from Last 3 Encounters:  04/28/24 74  04/21/24 69  04/08/24 78    In general this is a well appearing African-American female in no acute distress. She's alert and oriented x4 and appropriate throughout the examination. Cardiopulmonary assessment is negative for acute distress and she exhibits normal effort. Her left breast incision and axillary incision sites are well-healed without erythema, separation or drainage.    ECOG = 1  0 - Asymptomatic (Fully active, able to carry on all predisease activities without  restriction)  1 - Symptomatic but completely ambulatory (Restricted in physically strenuous activity but ambulatory and able to carry out work of a light or sedentary nature. For example, light housework, office work)  2 - Symptomatic, <50% in bed during the day (Ambulatory and capable of all self care but unable to carry out any work activities. Up and about more than 50% of waking hours)  3 - Symptomatic, >50% in bed, but not bedbound (Capable of only limited self-care, confined to bed or chair 50% or more of waking hours)  4 - Bedbound (Completely disabled. Cannot carry on any self-care. Totally confined to bed or chair)  5 - Death   Aurea Blossom MM, Creech RH, Tormey DC, et al. [phone]). Toxicity and response criteria of the [facility]. Am. Hillard Lowes. Oncol. 5 (6): 649-55    LABORATORY DATA:  Lab Results  Component Value Date   WBC 8.6 03/11/2024   HGB 13.7 03/11/2024   HCT 42.0 03/11/2024   MCV 83.3 03/11/2024   PLT 218 03/11/2024   Lab Results  Component Value Date   NA 138 04/28/2024   K 4.1 04/28/2024   CL 104 04/28/2024   CO2 28 04/28/2024   Lab Results  Component Value Date   ALT 10 03/11/2024   AST 12 (L) 03/11/2024   ALKPHOS 68 03/11/2024   BILITOT 0.5 03/11/2024      RADIOGRAPHY: ECHOCARDIOGRAM COMPLETE Result Date: 04/07/2024    ECHOCARDIOGRAM REPORT   Patient Name:   Janet [name] Date of Exam: 04/07/2024 Medical Rec #:  [id number]           Height:       68.0 in Accession #:    [id number]          Weight:       272.7 lb Date of Birth:  1966-09-17           BSA:          2.332 m Patient Age:    57 years            BP:           139/96 mmHg Patient Gender: F                   HR:           66 bpm. Exam Location:  Outpatient Procedure: 2D Echo, 3D Echo, Cardiac Doppler, Color Doppler and Strain Analysis            (Both Spectral and Color Flow Doppler were utilized during            procedure). Indications:    Z51.11 Encounter for  antineoplastic chemotheraphy  History:        Patient has prior history of Echocardiogram examinations, most                 recent 12/14/2022. CHF, Arrythmias:LBBB, Signs/Symptoms:Dyspnea,                 Shortness of Breath and Edema; Risk Factors:Hypertension. Breast                 cancer.  Sonographer:    Raynelle Callow RDCS Referring Phys: [id number] [facility]  Sonographer Comments: Image acquisition challenging due to patient body habitus. IMPRESSIONS  1. Left ventricular ejection fraction, by estimation, is 30 to 35%. The left ventricle has moderately decreased function. The left ventricle demonstrates global hypokinesis. The left ventricular internal cavity size was moderately to severely dilated. There is mild left ventricular hypertrophy of the basal and septal segments. Left ventricular diastolic parameters are consistent with Grade I diastolic dysfunction (impaired relaxation). Elevated left ventricular end-diastolic pressure. The  average left  ventricular global longitudinal strain is -10.3 %. The global longitudinal strain is abnormal.  2. Right ventricular systolic function is low normal. The right ventricular size is normal.  3. Left atrial size was mildly dilated.  4. Right atrial size was mildly dilated.  5. The mitral valve is grossly normal. Mild mitral valve regurgitation. No evidence of mitral stenosis.  6. The aortic valve is tricuspid. Aortic valve regurgitation is mild. No aortic stenosis is present.  7. Aortic dilatation noted. There is mild dilatation of the ascending aorta, measuring 42 mm. There is mild dilatation of the aortic root, measuring 41 mm.  8. Cannot exclude a small PFO. FINDINGS  Left Ventricle: Left ventricular ejection fraction, by estimation, is 30 to 35%. The left ventricle has moderately decreased function. The left ventricle demonstrates global hypokinesis. The average left ventricular global longitudinal strain is -10.3 %. Strain was performed and the global longitudinal  strain is abnormal. The left ventricular internal cavity size was moderately to severely dilated. There is mild left ventricular hypertrophy of the basal and septal segments. Abnormal (paradoxical) septal motion consistent with post-operative status. Left ventricular diastolic parameters are consistent with Grade I diastolic dysfunction (impaired relaxation). Elevated left ventricular end-diastolic pressure. Right Ventricle: The right ventricular size is normal. No increase in right ventricular wall thickness. Right ventricular systolic function is low normal. Left Atrium: Left atrial size was mildly dilated. Right Atrium: Right atrial size was mildly dilated. Pericardium: There is no evidence of pericardial effusion. Mitral Valve: The mitral valve is grossly normal. Mild mitral valve regurgitation. No evidence of mitral valve stenosis. Tricuspid Valve: The tricuspid valve is normal in structure. Tricuspid valve regurgitation is trivial. No evidence of tricuspid stenosis. Aortic Valve: The aortic valve is tricuspid. Aortic valve regurgitation is mild. Aortic regurgitation PHT measures 845 msec. No aortic stenosis is present. Aortic valve mean gradient measures 5.0 mmHg. Aortic valve peak gradient measures 8.4 mmHg. Aortic  valve area, by VTI measures 2.66 cm. Pulmonic Valve: The pulmonic valve was normal in structure. Pulmonic valve regurgitation is not visualized. No evidence of pulmonic stenosis. Aorta: Aortic dilatation noted. There is mild dilatation of the ascending aorta, measuring 42 mm. There is mild dilatation of the aortic root, measuring 41 mm. IAS/Shunts: Cannot exclude a small PFO. Additional Comments: 3D was performed not requiring image post processing on an independent workstation and was abnormal.  LEFT VENTRICLE PLAX 2D LVIDd:         5.20 cm      Diastology LVIDs:         5.20 cm      LV e' medial:    4.24 cm/s LV PW:         1.20 cm      LV E/e' medial:  12.2 LV IVS:        1.30 cm      LV e'  lateral:   3.48 cm/s LVOT diam:     2.40 cm      LV E/e' lateral: 14.9 LV SV:         92 LV SV Index:   40           2D Longitudinal Strain LVOT Area:     4.52 cm     2D Strain GLS Avg:     -10.3 %  LV Volumes (MOD) LV vol d, MOD A2C: 177.0 ml 3D Volume EF: LV vol d, MOD A4C: 188.0 ml 3D EF:  39 % LV vol s, MOD A2C: 117.0 ml LV EDV:       256 ml LV vol s, MOD A4C: 122.0 ml LV ESV:       155 ml LV SV MOD A2C:     60.0 ml  LV SV:        100 ml LV SV MOD A4C:     188.0 ml LV SV MOD BP:      59.0 ml RIGHT VENTRICLE            IVC RV S prime:     9.57 cm/s  IVC diam: 2.30 cm TAPSE (M-mode): 2.3 cm LEFT ATRIUM             Index        RIGHT ATRIUM           Index LA diam:        3.30 cm 1.42 cm/m   RA Area:     20.10 cm LA Vol (A2C):   55.7 ml 23.89 ml/m  RA Volume:   65.70 ml  28.18 ml/m LA Vol (A4C):   57.6 ml 24.70 ml/m LA Biplane Vol: 58.6 ml 25.13 ml/m  AORTIC VALVE AV Area (Vmax):    2.94 cm AV Area (Vmean):   2.72 cm AV Area (VTI):     2.66 cm AV Vmax:           145.00 cm/s AV Vmean:          98.900 cm/s AV VTI:            0.347 m AV Peak Grad:      8.4 mmHg AV Mean Grad:      5.0 mmHg LVOT Vmax:         94.30 cm/s LVOT Vmean:        59.400 cm/s LVOT VTI:          0.204 m LVOT/AV VTI ratio: 0.59 AI PHT:            845 msec  AORTA Ao Root diam: 4.10 cm Ao Asc diam:  4.20 cm MITRAL VALVE MV Area (PHT): 2.99 cm    SHUNTS MV Decel Time: 254 msec    Systemic VTI:  0.20 m MV E velocity: 51.85 cm/s  Systemic Diam: 2.40 cm MV A velocity: 55.25 cm/s MV E/A ratio:  0.94 Janelle Mediate MD Electronically signed by Janelle Mediate MD Signature Date/Time: 04/07/2024/12:12:40 PM    Final        IMPRESSION/PLAN: 1.  Stage IB, pT3N1M0, grade 2, ER/PR positive invasive papillary carcinoma of the left breast. Dr. Jeryl Moris has reviewed her final pathology results.  The patient has healed well from her surgery, and her Oncotype DX ***.  Given the concern for her positive nodes, Dr. Jeryl Moris would also recommend external  radiotherapy to the breast and regional lymph nodes to reduce risks of local recurrence. Dr. Arno Bibles anticipates adjuvant antiestrogen therapy to follow. We discussed the risks, benefits, short, and long term effects of radiotherapy, as well as the curative intent, and the patient is interested in proceeding.  I discussed the delivery and logistics of radiotherapy and Dr. Jeryl Moris recommends 6 1/2 weeks of radiotherapy to the left breast and regional lymph nodes with deep inspiration breath-hold technique. Written consent is obtained and placed in the chart, a copy was provided to the patient.  She will simulate today.   In a visit lasting *** minutes, greater than 50% of the time was spent face to  face reviewing her case, as well as in preparation of, discussing, and coordinating the patient's care.       Shelvia Dick, [facility]    **Disclaimer: This note was dictated with voice recognition software. Similar sounding words can inadvertently be transcribed and this note may contain transcription errors which may not have been corrected upon publication of note.**

## 2024-05-05 NOTE — Progress Notes (Signed)
 [facility] Clinical Social Work  Visual merchandiser received TC from patient asking about transportation resources.   Patient is on disability and has [employer] Medicare and Medicaid. She has utilized all of her rides from [facility] for the year. Pt has tried calling Medicaid but was unable to reach anyone and was unsure if there was transportation benefit.  CSW discussed resource below:  Medicaid: explained process to call & be screened. Once screened, unlimited rides for doctor's appointments [employer] Road to Recovery: provided contact information and explained program. [phone] [facility] transportation: pt will call back if unable to reach Medicaid and then CSW will refer to [place].  CSW provided direct contact information and encouraged pt to call back with further questions or if unable to reach Medicaid.    Seleta Hovland E Maximilliano Kersh, LCSW  Clinical Social Worker [facility]

## 2024-05-08 ENCOUNTER — Telehealth: Payer: Self-pay

## 2024-05-08 ENCOUNTER — Ambulatory Visit: Admitting: Radiation Oncology

## 2024-05-08 ENCOUNTER — Inpatient Hospital Stay
Admission: RE | Admit: 2024-05-08 | Discharge: 2024-05-08 | Disposition: A | Discharge status: Home / Self Care | Source: Ambulatory Visit | Attending: Radiation Oncology | Admitting: Radiation Oncology

## 2024-05-08 ENCOUNTER — Encounter

## 2024-05-08 DIAGNOSIS — Z170 Estrogen receptor positive status [ER+]: Secondary | ICD-10-CM

## 2024-05-08 NOTE — Telephone Encounter (Signed)
 Verbally confirmed appt for 6/20

## 2024-05-09 ENCOUNTER — Encounter: Payer: Self-pay | Admitting: Genetic Counselor

## 2024-05-09 ENCOUNTER — Inpatient Hospital Stay

## 2024-05-09 ENCOUNTER — Other Ambulatory Visit (HOSPITAL_COMMUNITY): Payer: Self-pay

## 2024-05-09 ENCOUNTER — Other Ambulatory Visit: Payer: Self-pay | Admitting: Genetic Counselor

## 2024-05-09 ENCOUNTER — Encounter: Admitting: Genetic Counselor

## 2024-05-09 ENCOUNTER — Inpatient Hospital Stay (HOSPITAL_BASED_OUTPATIENT_CLINIC_OR_DEPARTMENT_OTHER): Admitting: Hematology and Oncology

## 2024-05-09 ENCOUNTER — Inpatient Hospital Stay: Admitting: Genetic Counselor

## 2024-05-09 VITALS — BP 83/69 | HR 78 | Temp 98.2°F | Resp 17 | Wt 260.8 lb

## 2024-05-09 DIAGNOSIS — Z170 Estrogen receptor positive status [ER+]: Secondary | ICD-10-CM

## 2024-05-09 DIAGNOSIS — Z853 Personal history of malignant neoplasm of breast: Secondary | ICD-10-CM | POA: Diagnosis not present

## 2024-05-09 DIAGNOSIS — C50212 Malignant neoplasm of upper-inner quadrant of left female breast: Secondary | ICD-10-CM

## 2024-05-09 DIAGNOSIS — Z1379 Encounter for other screening for genetic and chromosomal anomalies: Secondary | ICD-10-CM

## 2024-05-09 LAB — CBC WITH DIFFERENTIAL/PLATELET
Abs Immature Granulocytes: 0.01 10*3/uL (ref 0.00–0.07)
Basophils Absolute: 0 10*3/uL (ref 0.0–0.1)
Basophils Relative: 1 %
Eosinophils Absolute: 0.3 10*3/uL (ref 0.0–0.5)
Eosinophils Relative: 4 %
HCT: 43.1 % (ref 36.0–46.0)
Hemoglobin: 14.2 g/dL (ref 12.0–15.0)
Immature Granulocytes: 0 %
Lymphocytes Relative: 47 %
Lymphs Abs: 3.2 10*3/uL (ref 0.7–4.0)
MCH: 26.9 pg (ref 26.0–34.0)
MCHC: 32.9 g/dL (ref 30.0–36.0)
MCV: 81.8 fL (ref 80.0–100.0)
Monocytes Absolute: 0.5 10*3/uL (ref 0.1–1.0)
Monocytes Relative: 7 %
Neutro Abs: 2.7 10*3/uL (ref 1.7–7.7)
Neutrophils Relative %: 41 %
Platelets: 238 10*3/uL (ref 150–400)
RBC: 5.27 MIL/uL — ABNORMAL HIGH (ref 3.87–5.11)
RDW: 15 % (ref 11.5–15.5)
WBC: 6.7 10*3/uL (ref 4.0–10.5)
nRBC: 0 % (ref 0.0–0.2)

## 2024-05-09 LAB — GENETIC SCREENING ORDER

## 2024-05-09 NOTE — Progress Notes (Signed)
 REFERRING PROVIDER: Murleen Arms, MD  PRIMARY PROVIDER:  Senaida Dama, NP  PRIMARY REASON FOR VISIT:  Encounter Diagnosis  Name Primary?   Malignant neoplasm of upper-inner quadrant of left breast in female, estrogen receptor positive (HCC) Yes   HISTORY OF PRESENT ILLNESS:   Janet [name], a 58 y.o. female, was seen for a [place] cancer genetics consultation at the request of Dr. Arno Bibles due to a personal history of cancer.  Janet [name] presents to clinic today to discuss the possibility of a hereditary predisposition to cancer, to discuss genetic testing, and to further clarify her future cancer risks, as well as potential cancer risks for family members.   Janet [name] was diagnosed with left breast cancer at age 16 (ER/PR positive, HER2 negative).  CANCER HISTORY:  Oncology History  Malignant neoplasm of upper-inner quadrant of left breast in female, estrogen receptor positive (HCC)  10/11/2023 Mammogram   There is a dominant solid mass at the palpable site of concern in the left breast at 6 o'clock measuring 4.2 cm. There are multiple other smaller solid-appearing masses in the left breast at 8 o'clock, 9 o'clock and 10 o'clock. No evidence of left axillary lymphadenopathy. No evidence of malignancy in the right breast.   03/11/2024 Initial Diagnosis   Malignant neoplasm of upper-inner quadrant of left breast in female, estrogen receptor positive (HCC)   03/13/2024 Pathology Results   Left lumpectomy  Papillary carcinoma with invasion, 5.1 cm, grade 2  Ductal carcinoma in situ: Cribriform, nuclear grade 2  Margins, invasive: Positive      Closest, invasive: Posterior and medial  Margins, DCIS: Negative      Closest, DCIS: 1 mm, superior and anterior  Lymphovascular invasion: Not identified   The tumor cells are NEGATIVE for Her2 (1+).   Estrogen Receptor:       95%, POSTIVE, STRONG STAINING INTENSITY  Progesterone Receptor:   10%, POSITIVE, MODERATE STAINING  INTENSITY  Proliferation Marker Ki-67:   20%       RISK FACTORS:  First live birth at age 33.  Ovaries intact: one.  Uterus intact: no.  Menopausal status: postmenopausal.  HRT use: 0 years. Colonoscopy: yes; 2025 colonoscopy identified8 polyps, a mixture of tubular adenomas, sessile serrated, and tubulovillous adenoma.  Past Medical History:  Diagnosis Date   Allergy    Anxiety    Arthritis    Asthma    Breast cancer (HCC) 02/28/2024   CHF (congestive heart failure) (HCC)    Congestive heart failure (CHF) (HCC)    Depression    Elevated brain natriuretic peptide (BNP) level    Hypertension    Lymphedema    Nonischemic cardiomyopathy (HCC) 03/2021   Pre-diabetes    SOB (shortness of breath)    Substance abuse (HCC)     Past Surgical History:  Procedure Laterality Date   ABDOMINAL HYSTERECTOMY     BREAST BIOPSY Right    BREAST BIOPSY Left 10/16/2023   times 3   BREAST BIOPSY Left 10/16/2023   US  LT BREAST BX W LOC DEV EA ADD LESION IMG BX SPEC US  GUIDE 10/16/2023 [facility]   BREAST BIOPSY Left 10/16/2023   US  LT BREAST BX W LOC DEV 1ST LESION IMG BX SPEC US  GUIDE 10/16/2023 [facility]   BREAST BIOPSY Left 10/16/2023   US  LT BREAST BX W LOC DEV EA ADD LESION IMG BX SPEC US  GUIDE 10/16/2023 [facility]   BREAST BIOPSY Left 02/27/2024   US  LT RADIOACTIVE SEED LOC 02/27/2024 [facility]   BREAST BIOPSY Left 02/27/2024   US  LT RADIOACTIVE SEED EA ADD LESION 02/27/2024 [facility]   BREAST LUMPECTOMY WITH RADIOACTIVE SEED AND SENTINEL LYMPH NODE BIOPSY Left 04/08/2024   Procedure: BREAST LUMPECTOMY WITH RADIOACTIVE SEED AND SENTINEL LYMPH NODE BIOPSY;  Surgeon: Sim Dryer, MD;  Location: [facility];  Service: General;  Laterality: Left;  LEFT BREAST RE-EXCISION LUMPECTOMY, LEFT SENTINEL LYMPH NODE MAPPING   BREAST LUMPECTOMY WITH RADIOACTIVE SEED LOCALIZATION Left 02/28/2024   Procedure: LEFT BREAST SEED LUMPECTOMY;  Surgeon: Sim Dryer,  MD;  Location: [facility] OR;  Service: General;  Laterality: Left;  3 seeds   CESAREAN SECTION     COLONOSCOPY WITH PROPOFOL  N/A 12/10/2023   Procedure: COLONOSCOPY WITH PROPOFOL ;  Surgeon: Ace Holder, MD;  Location: [facility];  Service: Gastroenterology;  Laterality: N/A;   HEMOSTASIS CLIP PLACEMENT  12/10/2023   Procedure: HEMOSTASIS CLIP PLACEMENT;  Surgeon: Ace Holder, MD;  Location: [facility];  Service: Gastroenterology;;   HERNIA REPAIR     umbilical as a child   POLYPECTOMY  12/10/2023   Procedure: POLYPECTOMY;  Surgeon: Ace Holder, MD;  Location: [facility];  Service: Gastroenterology;;   RIGHT/LEFT HEART CATH AND CORONARY ANGIOGRAPHY N/A 04/01/2021   Procedure: RIGHT/LEFT HEART CATH AND CORONARY ANGIOGRAPHY;  Surgeon: Lucendia Rusk, MD;  Location: [facility];  Service: Cardiovascular;  Laterality: N/A;   TUBAL LIGATION      Social History   Socioeconomic History   Marital status: Single    Spouse name: Not on file   Number of children: 2   Years of education: 14   Highest education level: Associate degree: academic program  Occupational History   Occupation: Consulting civil engineer   Occupation: disabled  Tobacco Use   Smoking status: Never    Passive exposure: Current   Smokeless tobacco: Never  Vaping Use   Vaping status: Never Used  Substance and Sexual Activity   Alcohol use: Yes    Alcohol/week: 3.0 standard drinks of alcohol    Types: 3 Shots of liquor per week    Comment: 1 pint every other week   Drug use: Yes    Frequency: 1.0 times per week    Types: Marijuana, Cocaine    Comment: Pt has not sued cocaine in 6 months ( as of 04/07/24) twice a week   Sexual activity: Not Currently    Birth control/protection: Surgical  Other Topics Concern   Not on file  Social History Narrative   Not on file   Social Drivers of Health   Financial Resource Strain: Low Risk  (01/03/2024)   Overall Financial Resource Strain (CARDIA)     Difficulty of Paying Living Expenses: Not very hard  Food Insecurity: Food Insecurity Present (03/13/2024)   Hunger Vital Sign    Worried About Running Out of Food in the Last Year: Never true    Ran Out of Food in the Last Year: Sometimes true  Transportation Needs: Unmet Transportation Needs (05/05/2024)   PRAPARE - Administrator, Civil Service (Medical): Yes    Lack of Transportation (Non-Medical): No  Physical Activity: Inactive (01/03/2024)   Exercise Vital Sign    Days of Exercise per Week: 0 days    Minutes of Exercise per Session: 0 min  Stress: Stress Concern Present (01/03/2024)   [employer] of Occupational Health - Occupational Stress Questionnaire    Feeling of Stress : Very much  Social Connections: Socially Isolated (01/03/2024)   Social  Connection and Isolation Panel    Frequency of Communication with Friends and Family: More than three times a week    Frequency of Social Gatherings with Friends and Family: Once a week    Attends Religious Services: Never    Database administrator or Organizations: No    Attends Engineer, structural: Not on file    Marital Status: Never married     FAMILY HISTORY:  We obtained a detailed, 4-generation family history.  Significant diagnoses are listed below: Family History  Problem Relation Age of Onset   Colon polyps Mother    Hypertension Mother    Stroke Mother    Deep vein thrombosis Mother    Congestive Heart Failure Mother    Arthritis Mother    Asthma Mother    Depression Mother    Other Father        history unknown   Obesity Sister    Diabetes Sister    Obesity Brother    Lung cancer Maternal Uncle        smoked   Congestive Heart Failure Maternal Grandmother    Throat cancer Maternal Grandfather        smoked   Colon cancer Neg Hx    Esophageal cancer Neg Hx    Rectal cancer Neg Hx    Stomach cancer Neg Hx       Janet [name] is unaware of previous family history of genetic testing  for hereditary cancer risks. There is no reported Ashkenazi Jewish ancestry.   GENETIC COUNSELING ASSESSMENT: Janet [name] is a 58 y.o. female with a personal and family history of cancer which is somewhat suggestive of a hereditary predisposition to cancer given her young age at diagnosis (<60). We, therefore, discussed and recommended the following at today's visit.   DISCUSSION: We discussed that 5 - 10% of cancer is hereditary, with most cases of breast cancer associated with BRCA1/2.  There are other genes that can be associated with hereditary breast cancer syndromes.  We discussed that testing is beneficial for several reasons including knowing how to follow individuals after completing their treatment, identifying whether potential treatment options would be beneficial, and understanding if other family members could be at risk for cancer and allowing them to undergo genetic testing.   We reviewed the characteristics, features and inheritance patterns of hereditary cancer syndromes. We also discussed genetic testing, including the appropriate family members to test, the process of testing, insurance coverage and turn-around-time for results. We discussed the implications of a negative, positive, carrier and/or variant of uncertain significant result. We recommended Janet [name] pursue genetic testing for a panel that includes genes associated with breast cancer.   Ms. Piech was offered a common hereditary cancer panel (40 genes) and an expanded pan-cancer panel (77 genes). Janet [name] was informed of the benefits and limitations of each panel, including that expanded pan-cancer panels contain genes that do not have clear management guidelines at this point in time.  We also discussed that as the number of genes included on a panel increases, the chances of variants of uncertain significance increases. After considering the benefits and limitations of each gene panel, Janet [name] elected to  have [facility] CancerNext-Expanded Panel.  The CancerNext-Expanded gene panel offered by [facility] and includes sequencing, rearrangement, and RNA analysis for the following 77 genes: AIP, ALK, APC, ATM, AXIN2, BAP1, BARD1, BMPR1A, BRCA1, BRCA2, BRIP1, CDC73, CDH1, CDK4, CDKN1B, CDKN2A, CEBPA, CHEK2, CTNNA1, DDX41, DICER1, ETV6, FH, FLCN, GATA2, LZTR1,  MAX, MBD4, MEN1, MET, MLH1, MSH2, MSH3, MSH6, MUTYH, NF1, NF2, NTHL1, PALB2, PHOX2B, PMS2, POT1, PRKAR1A, PTCH1, PTEN, RAD51C, RAD51D, RB1, RET, RPS20, RUNX1, SDHA, SDHAF2, SDHB, SDHC, SDHD, SMAD4, SMARCA4, SMARCB1, SMARCE1, STK11, SUFU, TMEM127, TP53, TSC1, TSC2, VHL, and WT1 (sequencing and deletion/duplication); EGFR, HOXB13, KIT, MITF, PDGFRA, POLD1, and POLE (sequencing only); EPCAM and GREM1 (deletion/duplication only).   Based on Janet [name]'s personal and family history of cancer, she meets medical criteria for genetic testing. We discussed that she should not have an out of pocket cost with her current insurance of [facility] Medicare and Medicaid.   PLAN: After considering the risks, benefits, and limitations, Janet [name] provided informed consent to pursue genetic testing and the blood sample was sent to [facility] for analysis of the CancerNext-Expanded. Results should be available within approximately 2-3 weeks' time, at which point they will be disclosed by telephone to Janet [name], as will any additional recommendations warranted by these results. Janet [name] will receive a summary of her genetic counseling visit and a copy of her results once available. This information will also be available in Epic.   Janet [name]'s questions were answered to her satisfaction today. Our contact information was provided should additional questions or concerns arise. Thank you for the referral and allowing us  to share in the care of your patient.   Claretta Kendra, MS, [facility] Genetic Counselor [email or web address] (P) [phone]  40  minutes were spent on the date of the encounter in service to the patient including preparation, telephone consultation, documentation and care coordination. The patient was seen alone.  Drs. Gudena and/or Maryalice Smaller were available to discuss this case as needed.   _______________________________________________________________________ For Office Staff:  Number of people involved in session: 1 Was an Intern/ student involved with case: no

## 2024-05-09 NOTE — Progress Notes (Signed)
 START ON PATHWAY REGIMEN - Breast     A cycle is every 21 days:     Cyclophosphamide      Docetaxel   **Always confirm dose/schedule in your pharmacy ordering system**  Patient Characteristics: Postoperative without Neoadjuvant Therapy, M0 (Pathologic Staging), Invasive Disease, Adjuvant Therapy, HER2 Negative, ER Positive, Node Positive, Node Positive (1-3), Oncotyping Ordered, Oncotype High Risk (? 26) Therapeutic Status: Postoperative without Neoadjuvant Therapy, M0 (Pathologic Staging) AJCC Grade: G2 AJCC N Category: pN1 AJCC M Category: cM0 ER Status: Positive (+) AJCC 8 Stage Grouping: IIB HER2 Status: Negative (-) Oncotype Dx Recurrence Score: 42 AJCC T Category: pT2 PR Status: Negative (-) Has this patient completed genomic testing<= Yes - Oncotype DX(R) Intent of Therapy: Curative Intent, Discussed with Patient

## 2024-05-09 NOTE — Progress Notes (Signed)
 Echo order placed per Dr/ Sabharwal

## 2024-05-09 NOTE — Progress Notes (Signed)
 [facility] CONSULT NOTE  Patient Care Team: Senaida Dama, NP as PCP - General (Nurse Practitioner) Nahser, Lela Purple, MD as PCP - Cardiology (Cardiology) Nahser, Lela Purple, MD as Consulting Physician (Cardiology) Auther Bo, RN (Inactive) as Oncology Nurse Navigator Alane Hsu, RN as Oncology Nurse Navigator Murleen Arms, MD as Consulting Physician (Hematology and Oncology)  CHIEF COMPLAINTS/PURPOSE OF CONSULTATION:  Newly diagnosed breast cancer  HISTORY OF PRESENTING ILLNESS:  Janet [name] 58 y.o. female is here because of recent diagnosis of left breast cancer  I reviewed her records extensively and collaborated the history with the patient.  SUMMARY OF ONCOLOGIC HISTORY: Oncology History  Malignant neoplasm of upper-inner quadrant of left breast in female, estrogen receptor positive (HCC)  10/11/2023 Mammogram   There is a dominant solid mass at the palpable site of concern in the left breast at 6 o'clock measuring 4.2 cm. There are multiple other smaller solid-appearing masses in the left breast at 8 o'clock, 9 o'clock and 10 o'clock. No evidence of left axillary lymphadenopathy. No evidence of malignancy in the right breast.   03/11/2024 Initial Diagnosis   Malignant neoplasm of upper-inner quadrant of left breast in female, estrogen receptor positive (HCC)   03/11/2024 Cancer Staging   Staging form: Breast, AJCC 8th Edition - Pathologic: Stage IB (pT3, pN1(sn), cM0, G2, ER+, PR+, HER2-) - Signed by Murleen Arms, MD on 05/09/2024 Method of lymph node assessment: Sentinel lymph node biopsy Histologic grading system: 3 grade system   03/13/2024 Pathology Results   Left lumpectomy  Papillary carcinoma with invasion, 5.1 cm, grade 2  Ductal carcinoma in situ: Cribriform, nuclear grade 2  Margins, invasive: Positive      Closest, invasive: Posterior and medial  Margins, DCIS: Negative      Closest, DCIS: 1 mm, superior and anterior   Lymphovascular invasion: Not identified   The tumor cells are NEGATIVE for Her2 (1+).   Estrogen Receptor:       95%, POSTIVE, STRONG STAINING INTENSITY  Progesterone Receptor:   10%, POSITIVE, MODERATE STAINING INTENSITY  Proliferation Marker Ki-67:   20%    05/23/2024 -  Chemotherapy   Patient is on Treatment Plan : BREAST TC q21d      Discussed the use of AI scribe software for clinical note transcription with the patient, who gave verbal consent to proceed.  History of Present Illness  Janet [name] is a 58 year old female with breast cancer who presents with high oncotype test results.  She recently underwent an oncotype test to evaluate the potential benefit of chemotherapy for her breast cancer. The test results revealed a high score of 42, indicating a significant risk of recurrence and a substantial benefit from chemotherapy.  She has a history of cardiac issues and her cardiologist has recently increased her Entresto  dosage. She is concerned about the impact of the chemotherapy regimen on her heart health.   She is agreeable to chemotherapy if this is the recommendation from all her doctors.   MEDICAL HISTORY:  Past Medical History:  Diagnosis Date   Allergy    Anxiety    Arthritis    Asthma    Breast cancer (HCC) 02/28/2024   CHF (congestive heart failure) (HCC)    Congestive heart failure (CHF) (HCC)    Depression    Elevated brain natriuretic peptide (BNP) level    Hypertension    Lymphedema    Nonischemic cardiomyopathy (HCC) 03/2021   Pre-diabetes    SOB (  shortness of breath)    Substance abuse (HCC)     SURGICAL HISTORY: Past Surgical History:  Procedure Laterality Date   ABDOMINAL HYSTERECTOMY     BREAST BIOPSY Right    BREAST BIOPSY Left 10/16/2023   times 3   BREAST BIOPSY Left 10/16/2023   US  LT BREAST BX W LOC DEV EA ADD LESION IMG BX SPEC US  GUIDE 10/16/2023 [facility]   BREAST BIOPSY Left 10/16/2023   US  LT BREAST BX W LOC  DEV 1ST LESION IMG BX SPEC US  GUIDE 10/16/2023 [facility]   BREAST BIOPSY Left 10/16/2023   US  LT BREAST BX W LOC DEV EA ADD LESION IMG BX SPEC US  GUIDE 10/16/2023 [facility]   BREAST BIOPSY Left 02/27/2024   US  LT RADIOACTIVE SEED LOC 02/27/2024 [facility]   BREAST BIOPSY Left 02/27/2024   US  LT RADIOACTIVE SEED EA ADD LESION 02/27/2024 [facility]   BREAST LUMPECTOMY WITH RADIOACTIVE SEED AND SENTINEL LYMPH NODE BIOPSY Left 04/08/2024   Procedure: BREAST LUMPECTOMY WITH RADIOACTIVE SEED AND SENTINEL LYMPH NODE BIOPSY;  Surgeon: Sim Dryer, MD;  Location: [facility] OR;  Service: General;  Laterality: Left;  LEFT BREAST RE-EXCISION LUMPECTOMY, LEFT SENTINEL LYMPH NODE MAPPING   BREAST LUMPECTOMY WITH RADIOACTIVE SEED LOCALIZATION Left 02/28/2024   Procedure: LEFT BREAST SEED LUMPECTOMY;  Surgeon: Sim Dryer, MD;  Location: [facility] OR;  Service: General;  Laterality: Left;  3 seeds   CESAREAN SECTION     COLONOSCOPY WITH PROPOFOL  N/A 12/10/2023   Procedure: COLONOSCOPY WITH PROPOFOL ;  Surgeon: Ace Holder, MD;  Location: [facility];  Service: Gastroenterology;  Laterality: N/A;   HEMOSTASIS CLIP PLACEMENT  12/10/2023   Procedure: HEMOSTASIS CLIP PLACEMENT;  Surgeon: Ace Holder, MD;  Location: [facility];  Service: Gastroenterology;;   HERNIA REPAIR     umbilical as a child   POLYPECTOMY  12/10/2023   Procedure: POLYPECTOMY;  Surgeon: Ace Holder, MD;  Location: [facility];  Service: Gastroenterology;;   RIGHT/LEFT HEART CATH AND CORONARY ANGIOGRAPHY N/A 04/01/2021   Procedure: RIGHT/LEFT HEART CATH AND CORONARY ANGIOGRAPHY;  Surgeon: Lucendia Rusk, MD;  Location: [facility];  Service: Cardiovascular;  Laterality: N/A;   TUBAL LIGATION      SOCIAL HISTORY: Social History   Socioeconomic History   Marital status: Single    Spouse name: Not on file   Number of children: 2   Years of education: 14   Highest education level:  Associate degree: academic program  Occupational History   Occupation: Consulting civil engineer   Occupation: disabled  Tobacco Use   Smoking status: Never    Passive exposure: Current   Smokeless tobacco: Never  Vaping Use   Vaping status: Never Used  Substance and Sexual Activity   Alcohol use: Yes    Alcohol/week: 3.0 standard drinks of alcohol    Types: 3 Shots of liquor per week    Comment: 1 pint every other week   Drug use: Yes    Frequency: 1.0 times per week    Types: Marijuana, Cocaine    Comment: Pt has not sued cocaine in 6 months ( as of 04/07/24) twice a week   Sexual activity: Not Currently    Birth control/protection: Surgical  Other Topics Concern   Not on file  Social History Narrative   Not on file   Social Drivers of Health   Financial Resource Strain: Low Risk  (01/03/2024)   Overall Financial Resource Strain (CARDIA)    Difficulty of Paying  Living Expenses: Not very hard  Food Insecurity: Food Insecurity Present (03/13/2024)   Hunger Vital Sign    Worried About Running Out of Food in the Last Year: Never true    Ran Out of Food in the Last Year: Sometimes true  Transportation Needs: Unmet Transportation Needs (05/05/2024)   PRAPARE - Administrator, Civil Service (Medical): Yes    Lack of Transportation (Non-Medical): No  Physical Activity: Inactive (01/03/2024)   Exercise Vital Sign    Days of Exercise per Week: 0 days    Minutes of Exercise per Session: 0 min  Stress: Stress Concern Present (01/03/2024)   [employer] of Occupational Health - Occupational Stress Questionnaire    Feeling of Stress : Very much  Social Connections: Socially Isolated (01/03/2024)   Social Connection and Isolation Panel    Frequency of Communication with Friends and Family: More than three times a week    Frequency of Social Gatherings with Friends and Family: Once a week    Attends Religious Services: Never    Database administrator or Organizations: No    Attends Probation officer: Not on file    Marital Status: Never married  Intimate Partner Violence: Not At Risk (03/13/2024)   Humiliation, Afraid, Rape, and Kick questionnaire    Fear of Current or Ex-Partner: No    Emotionally Abused: No    Physically Abused: No    Sexually Abused: No    FAMILY HISTORY: Family History  Problem Relation Age of Onset   Colon polyps Mother    Hypertension Mother    Stroke Mother    Deep vein thrombosis Mother    Congestive Heart Failure Mother    Arthritis Mother    Asthma Mother    Depression Mother    Other Father        history unknown   Obesity Sister    Diabetes Sister    Obesity Brother    Lung cancer Maternal Uncle        smoked   Congestive Heart Failure Maternal Grandmother    Throat cancer Maternal Grandfather        smoked   Colon cancer Neg Hx    Esophageal cancer Neg Hx    Rectal cancer Neg Hx    Stomach cancer Neg Hx     ALLERGIES:  is allergic to clarithromycin, penicillins, and penicillin g.  MEDICATIONS:  Current Outpatient Medications  Medication Sig Dispense Refill   albuterol  (PROVENTIL ) (2.5 MG/3ML) 0.083% nebulizer solution Take 3 mLs (2.5 mg total) by nebulization every 4 (four) hours as needed for wheezing or shortness of breath. (Patient taking differently: Take 2.5 mg by nebulization daily.) 75 mL 2   albuterol  (VENTOLIN  HFA) 108 (90 Base) MCG/ACT inhaler Inhale 2 puffs into the lungs every 6 (six) hours as needed for wheezing or shortness of breath. 18 g 2   Budeson-Glycopyrrol-Formoterol  (BREZTRI  AEROSPHERE) 160-9-4.8 MCG/ACT AERO Inhale 2 puffs into the lungs in the morning and at bedtime. 10.7 g 5   carvedilol  (COREG ) 12.5 MG tablet Take 1 tablet (12.5 mg total) by mouth 2 (two) times daily with a meal. 60 tablet 11   dapagliflozin  propanediol (FARXIGA ) 10 MG TABS tablet Take 1 tablet (10 mg total) by mouth daily before breakfast. 30 tablet 5   DULoxetine (CYMBALTA) 30 MG capsule Take 30 mg by mouth daily.      fluticasone  (FLONASE ) 50 MCG/ACT nasal spray Place 2 sprays into both nostrils daily. (Patient  taking differently: Place 2 sprays into both nostrils daily as needed for allergies or rhinitis.) 48 g 0   furosemide  (LASIX ) 20 MG tablet TAKE ONE TABLET (20 MG) BY MOUTH DAILY AT 9AM (Patient taking differently: Take 20 mg by mouth daily.) 30 tablet 11   hydrOXYzine  (VISTARIL ) 100 MG capsule Take 100 mg by mouth at bedtime.     ibuprofen (ADVIL) 800 MG tablet Take 800 mg by mouth every 6 (six) hours as needed for fever, headache or mild pain (pain score 1-3).     montelukast  (SINGULAIR ) 10 MG tablet TAKE ONE TABLET (10 MG) BY MOUTH DAILY AT 9PM AT BEDTIME 90 tablet 0   Multiple Vitamin (MULTIVITAMIN) tablet Take 1 tablet by mouth daily.     oxyCODONE  (OXY IR/ROXICODONE ) 5 MG immediate release tablet Take 1 tablet (5 mg total) by mouth every 6 (six) hours as needed for severe pain (pain score 7-10). 15 tablet 0   sacubitril -valsartan  (ENTRESTO ) 97-103 MG Take 1 tablet by mouth 2 (two) times daily. 60 tablet 3   spironolactone  (ALDACTONE ) 25 MG tablet TAKE ONE TABLET (25 MG) BY MOUTH DAILY 90 tablet 0   traZODone  (DESYREL ) 150 MG tablet Take 150 mg by mouth at bedtime.     No current facility-administered medications for this visit.    REVIEW OF SYSTEMS:   Constitutional: Denies fevers, chills or abnormal night sweats Eyes: Denies blurriness of vision, double vision or watery eyes Ears, nose, mouth, throat, and face: Denies mucositis or sore throat Respiratory: Denies cough, dyspnea or wheezes Cardiovascular: Denies palpitation, chest discomfort or lower extremity swelling Gastrointestinal:  Denies nausea, heartburn or change in bowel habits Skin: Denies abnormal skin rashes Lymphatics: Denies new lymphadenopathy or easy bruising Neurological:Denies numbness, tingling or new weaknesses Behavioral/Psych: Mood is stable, no new changes  Breast: Denies any palpable lumps or discharge All other  systems were reviewed with the patient and are negative.  PHYSICAL EXAMINATION: ECOG PERFORMANCE STATUS: 0 - Asymptomatic  Vitals:   05/09/24 1332  BP: (!) 83/69  Pulse: 78  Resp: 17  Temp: 98.2 F (36.8 C)  SpO2: 95%    Filed Weights   05/09/24 1332  Weight: 260 lb 12.8 oz (118.3 kg)     GENERAL:alert, no distress and comfortable   LABORATORY DATA:  I have reviewed the data as listed Lab Results  Component Value Date   WBC 6.7 05/09/2024   HGB 14.2 05/09/2024   HCT 43.1 05/09/2024   MCV 81.8 05/09/2024   PLT 238 05/09/2024   Lab Results  Component Value Date   NA 138 04/28/2024   K 4.1 04/28/2024   CL 104 04/28/2024   CO2 28 04/28/2024    RADIOGRAPHIC STUDIES: I have personally reviewed the radiological reports and agreed with the findings in the report.  ASSESSMENT AND PLAN:  Malignant neoplasm of upper-inner quadrant of left breast in female, estrogen receptor positive (HCC) Assessment and Plan Assessment & Plan Breast cancer with lymph node involvement Recent surgery removed five lymph nodes, two with small foci of cancer. Margins negative.  High oncotype score of 42 suggests significant chemotherapy benefit. Five-year recurrence risk is 25%. Chemotherapy recommended due to oncotype score and lymph node involvement. Cardiac concerns necessitate reconsideration of initial chemotherapy plan. Discussed chemotherapy side effects and emphasized blood count monitoring and growth factor support. Decision made to consider less intensive regimen due to cardiac dysfunction. - Consult cardiologist regarding non-anthracycline chemotherapy regimen due to cardiac dysfunction. - If approved, proceed with docetaxel and  cyclophosphamide every 21 days for four cycles. - Postpone radiation until after chemotherapy. - Arrange chemotherapy education class. - Monitor for side effects and provide supportive care. - Discussed with Dr Agustina Hosteller, ok to proceed with TC with close  follow up visits with cardiology.   Murleen Arms MD     Time spent: 30 min All questions were answered. The patient knows to call the clinic with any problems, questions or concerns.    Murleen Arms, MD 05/09/24

## 2024-05-09 NOTE — Assessment & Plan Note (Addendum)
 Assessment and Plan Assessment & Plan Breast cancer with lymph node involvement Recent surgery removed five lymph nodes, two with small foci of cancer. Margins negative.  High oncotype score of 42 suggests significant chemotherapy benefit. Five-year recurrence risk is 25%. Chemotherapy recommended due to oncotype score and lymph node involvement. Cardiac concerns necessitate reconsideration of initial chemotherapy plan. Discussed chemotherapy side effects and emphasized blood count monitoring and growth factor support. Decision made to consider less intensive regimen due to cardiac dysfunction. - Consult cardiologist regarding non-anthracycline chemotherapy regimen due to cardiac dysfunction. - If approved, proceed with docetaxel and cyclophosphamide every 21 days for four cycles. - Postpone radiation until after chemotherapy. - Arrange chemotherapy education class. - Monitor for side effects and provide supportive care. - Discussed with Dr Agustina Hosteller, ok to proceed with TC with close follow up visits with cardiology.   Murleen Arms MD

## 2024-05-10 ENCOUNTER — Other Ambulatory Visit: Payer: Self-pay

## 2024-05-13 ENCOUNTER — Other Ambulatory Visit

## 2024-05-13 ENCOUNTER — Encounter: Admitting: Genetic Counselor

## 2024-05-13 ENCOUNTER — Telehealth: Payer: Self-pay | Admitting: Genetic Counselor

## 2024-05-13 NOTE — Telephone Encounter (Signed)
 I contacted [name] in regards to her genetic testing cost. I reached out to [employer] but they cannot confirm with me that there is no charge. I gave [name] Ambry's billing department phone number so that she can make sure there is no charge for her test. I told her to contact me if there is a cost before canceling and we can work on ordering GRATIS testing so that there is no charge.   Janet Sholl, MS, [facility] Genetic Counselor [email or web address] (P) [phone]

## 2024-05-14 ENCOUNTER — Encounter: Payer: Self-pay | Admitting: Hematology and Oncology

## 2024-05-14 NOTE — Progress Notes (Signed)
 Pharmacist Chemotherapy Monitoring - Initial Assessment    Anticipated start date: 05/21/24   The following has been reviewed per standard work regarding the patient's treatment regimen: The patient's diagnosis, treatment plan and drug doses, and organ/hematologic function Lab orders and baseline tests specific to treatment regimen  The treatment plan start date, drug sequencing, and pre-medications Prior authorization status  Patient's documented medication list, including drug-drug interaction screen and prescriptions for anti-emetics and supportive care specific to the treatment regimen The drug concentrations, fluid compatibility, administration routes, and timing of the medications to be used The patient's access for treatment and lifetime cumulative dose history, if applicable  The patient's medication allergies and previous infusion related reactions, if applicable   Changes made to treatment plan:  N/A  Follow up needed:  Pending authorization for treatment  Home antiemetics needed  Maneh Sieben, Pharm.D., CPP 05/14/2024@3 :56 PM

## 2024-05-15 ENCOUNTER — Inpatient Hospital Stay: Admitting: Licensed Clinical Social Worker

## 2024-05-15 ENCOUNTER — Encounter (HOSPITAL_COMMUNITY): Payer: Self-pay | Admitting: Surgery

## 2024-05-15 ENCOUNTER — Inpatient Hospital Stay: Admitting: Pharmacist

## 2024-05-15 ENCOUNTER — Encounter: Payer: Self-pay | Admitting: *Deleted

## 2024-05-15 ENCOUNTER — Inpatient Hospital Stay

## 2024-05-15 DIAGNOSIS — C50212 Malignant neoplasm of upper-inner quadrant of left female breast: Secondary | ICD-10-CM

## 2024-05-15 DIAGNOSIS — Z170 Estrogen receptor positive status [ER+]: Secondary | ICD-10-CM | POA: Diagnosis not present

## 2024-05-15 MED ORDER — DEXAMETHASONE 4 MG PO TABS: ORAL_TABLET | ORAL | 1 refills | Status: DC

## 2024-05-15 MED ORDER — ONDANSETRON HCL 8 MG PO TABS: 8.0000 mg | ORAL_TABLET | Freq: Three times a day (TID) | ORAL | 1 refills | Status: DC | PRN

## 2024-05-15 MED ORDER — PROCHLORPERAZINE MALEATE 10 MG PO TABS: 10.0000 mg | ORAL_TABLET | Freq: Four times a day (QID) | ORAL | 1 refills | Status: DC | PRN

## 2024-05-15 NOTE — Progress Notes (Signed)
 [facility] CSW Progress Note  Clinical Child psychotherapist contacted patient by phone to follow-up on transportation and distress screen needs.   05/15/2024  ONCBCN DISTRESS SCREENING   Screening Type Initial Screening   How much distress have you been experiencing in the past week? (0-10) 4   Practical concerns type Transportation   Emotional concerns type Worry or anxiety   Physical Concerns Type  Pain;Sleep;Fatigue    Pt has been in contact with [place] DSS and they provided the number for the [place] [employer] [place] transportation to assist with her medical appointments. Pt plans to call today.  Pt voiced being overwhelmed after her chemo ed appt earlier and the list of everything she needs to complete before chemo start on Wednesday (dental cleaning for dentures, taking care of grandkids). She has also been out of trazodone  for 2 weeks but does has an appt with psychiatry & therapy on 6/30 and 7/1. She is having urge to drink due to stress.   Interventions: Provided brief mental health counseling with regard to overwhelm and stress. Normalized nerves with new treatment.  Provided patient with information about food pantry and [employer].  Utilized strengths-based counseling to allow pt to identify coping skills and plan to abstain from alcohol/drug use (time with granddaughters ages 43 & 2, talking with daughter, peer mentor, and friends, checking off tasks from her list)     Follow Up Plan:  Patient will contact CSW with any support or resource needs and CSW will see patient on 7/2 during 1st chemo treatment    Xavi Tomasik E Suhey Radford, LCSW Clinical Social Worker [facility]

## 2024-05-15 NOTE — OR Nursing (Signed)
 Late entry:  Due to an error with the procedure names related to radioactive seeds, the procedure documented on the day of surgery didn't match what the MD dictated in the operative notes. I corrected that procedure documentation. Berwyn Eagles, RN.

## 2024-05-15 NOTE — Progress Notes (Signed)
 [facility]       Telephone: [phone]?Fax: [phone]   Oncology Clinical Pharmacist Practitioner Initial Assessment  Janet [name] is a 58 y.o. female with a diagnosis of breast cancer. They were contacted today via in-person visit.  Indication/Regimen Docetaxel (Taxotere) and cyclophosphamide (Cytoxan) are being used appropriately for treatment of breast cancer by Dr. Amber Stalls.      Wt Readings from Last 1 Encounters:  05/09/24 260 lb 12.8 oz (118.3 kg)    Estimated body surface area is 2.38 meters squared as calculated from the following:   Height as of 04/28/24: 5' 8 (1.727 m).   Weight as of 05/09/24: 260 lb 12.8 oz (118.3 kg).  The dosing regimen cycle is every 21 days x 4 cycles  Docetaxel (75 mg/m2) on Day 1 Cyclophosphamide (600 mg/m2) on Day 1 Pegfilgrastim (6 mg) on Day 3  It is planned to continue until treatment plan completion or unacceptable toxicity. The tentative start date is: 05/21/24 with pegfilgrastim on 05/24/24 due to holiday on 05/23/24.  Dose Modifications None   Allergies Allergies  Allergen Reactions   Clarithromycin Other (See Comments)    Biaxin  GI upset  Other Reaction(s): Other (See Comments)  Upset stomach, Cramps   Penicillins Rash, Hives and Other (See Comments)   Penicillin G Rash    Vitals    05/09/2024    1:32 PM 04/28/2024   11:34 AM 04/21/2024   10:10 AM  Oncology Vitals  Height  173 cm   Weight 118.298 kg 119.84 kg 120.974 kg  Weight (lbs) 260 lbs 13 oz 264 lbs 3 oz 266 lbs 11 oz  BMI 39.65 kg/m2 40.17 kg/m2 40.55 kg/m2  Temp 98.2 F (36.8 C)  97.9 F (36.6 C)  Pulse Rate 78 74 69  BP 83/69 128/74 112/73  Resp 17  19  SpO2 95 % 96 % 98 %  BSA (m2) 2.38 m2 2.4 m2 2.41 m2     Laboratory Data    Latest Ref Rng & Units 05/09/2024    1:57 PM 03/11/2024    2:38 PM 02/25/2024   12:00 PM  CBC EXTENDED  WBC 4.0 - 10.5 K/uL 6.7  8.6  6.1   RBC 3.87 - 5.11 MIL/uL 5.27  5.04  5.39    Hemoglobin 12.0 - 15.0 g/dL 85.7  86.2  85.4   HCT 36.0 - 46.0 % 43.1  42.0  45.5   Platelets 150 - 400 K/uL 238  218  259   NEUT# 1.7 - 7.7 K/uL 2.7  4.9    Lymph# 0.7 - 4.0 K/uL 3.2  2.7         Latest Ref Rng & Units 04/28/2024   12:13 PM 03/11/2024    2:38 PM 02/25/2024   12:00 PM  CMP  Glucose 70 - 99 mg/dL 893  96  894   BUN 6 - 20 mg/dL 14  14  13    Creatinine 0.44 - 1.00 mg/dL 8.82  8.84  8.78   Sodium 135 - 145 mmol/L 138  142  141   Potassium 3.5 - 5.1 mmol/L 4.1  4.3  4.1   Chloride 98 - 111 mmol/L 104  108  107   CO2 22 - 32 mmol/L 28  30  26    Calcium 8.9 - 10.3 mg/dL 8.9  8.8  9.1   Total Protein 6.5 - 8.1 g/dL  6.6    Total Bilirubin 0.0 - 1.2 mg/dL  0.5  Alkaline Phos 38 - 126 U/L  68    AST 15 - 41 U/L  12    ALT 0 - 44 U/L  10     Contraindications Contraindications were reviewed? Yes Contraindications to therapy were identified? No   Safety Precautions The following safety precautions were reviewed:  Fever: reviewed the importance of having a thermometer and the Centers for Disease Control and Prevention (CDC) definition of fever which is 100.71F (38C) or higher. Patient should call 24/7 triage at [phone] if experiencing a fever or any other symptoms Decreased white blood cells (WBCs) and increased risk for infection Decreased platelet count and increased risk of bleeding Decreased hemoglobin, part of the red blood cells that carry iron and oxygen Hair Loss Fatigue Fluid retention or swelling (edema) Peripheral Neuropathy Mouth sores Rash or itchy skin Muscle or joint pain or weakness Nausea or vomiting Diarrhea Nail Changes Hypersensitivity reactions Secondary Malignancies Hemorrhagic cystitis Pneumonitis Handling body fluids and waste Intimacy, sexual activity, contraception, fertility  Medication Reconciliation Current Outpatient Medications  Medication Sig Dispense Refill   albuterol  (PROVENTIL ) (2.5 MG/3ML) 0.083% nebulizer  solution Take 3 mLs (2.5 mg total) by nebulization every 4 (four) hours as needed for wheezing or shortness of breath. 75 mL 2   albuterol  (VENTOLIN  HFA) 108 (90 Base) MCG/ACT inhaler Inhale 2 puffs into the lungs every 6 (six) hours as needed for wheezing or shortness of breath. 18 g 2   ASHWAGANDHA PO Take by mouth at bedtime as needed (for sleep).     Budeson-Glycopyrrol-Formoterol  (BREZTRI  AEROSPHERE) 160-9-4.8 MCG/ACT AERO Inhale 2 puffs into the lungs in the morning and at bedtime. 10.7 g 5   carvedilol  (COREG ) 12.5 MG tablet Take 1 tablet (12.5 mg total) by mouth 2 (two) times daily with a meal. 60 tablet 11   dapagliflozin  propanediol (FARXIGA ) 10 MG TABS tablet Take 1 tablet (10 mg total) by mouth daily before breakfast. 30 tablet 5   DULoxetine (CYMBALTA) 30 MG capsule Take 30 mg by mouth daily.     fluticasone  (FLONASE ) 50 MCG/ACT nasal spray Place 2 sprays into both nostrils daily. 48 g 0   furosemide  (LASIX ) 20 MG tablet TAKE ONE TABLET (20 MG) BY MOUTH DAILY AT 9AM 30 tablet 11   hydrOXYzine  (VISTARIL ) 100 MG capsule Take 100 mg by mouth at bedtime.     ibuprofen (ADVIL) 800 MG tablet Take 800 mg by mouth every 6 (six) hours as needed for fever, headache or mild pain (pain score 1-3).     montelukast  (SINGULAIR ) 10 MG tablet TAKE ONE TABLET (10 MG) BY MOUTH DAILY AT 9PM AT BEDTIME 90 tablet 0   Multiple Vitamin (MULTIVITAMIN) tablet Take 1 tablet by mouth daily.     sacubitril -valsartan  (ENTRESTO ) 97-103 MG Take 1 tablet by mouth 2 (two) times daily. 60 tablet 3   spironolactone  (ALDACTONE ) 25 MG tablet TAKE ONE TABLET (25 MG) BY MOUTH DAILY 90 tablet 0   dexamethasone  (DECADRON ) 4 MG tablet Take 2 tabs by mouth 2 times daily starting day before chemo. Then take 2 tabs daily for 2 days starting day after chemo. Take with food. 30 tablet 1   ondansetron  (ZOFRAN ) 8 MG tablet Take 1 tablet (8 mg total) by mouth every 8 (eight) hours as needed for nausea or vomiting. Start on the third day  after chemotherapy. 30 tablet 1   oxyCODONE  (OXY IR/ROXICODONE ) 5 MG immediate release tablet Take 1 tablet (5 mg total) by mouth every 6 (six) hours as needed  for severe pain (pain score 7-10). (Patient not taking: Reported on 05/15/2024) 15 tablet 0   prochlorperazine (COMPAZINE) 10 MG tablet Take 1 tablet (10 mg total) by mouth every 6 (six) hours as needed for nausea or vomiting. 30 tablet 1   traZODone  (DESYREL ) 150 MG tablet Take 150 mg by mouth at bedtime. (Patient not taking: Reported on 05/15/2024)     No current facility-administered medications for this visit.    Medication reconciliation is based on the patient's most recent medication list in the electronic medical record (EMR) including herbal products and OTC medications.   The patient's medication list was reviewed today with the patient? Yes   Drug-drug interactions (DDIs) DDIs were evaluated? Yes Significant DDIs identified? She has been tolerating her current medication list with no reported issues. Possible interactions include spironolactone -ibuprofen, spironolactone -Entresto , trazodone -duloxetine  Drug-Food Interactions Drug-food interactions were evaluated? Yes Drug-food interactions identified? No   Follow-up Plan  Treatment start date: 05/21/24 [facility] placement date: Peripheral administration per patient request We reviewed the prescriptions, premedications, and treatment regimen with the patient. Possible side effects of the treatment regimen were reviewed and management strategies were discussed.  Can use loperamide as needed for diarrhea, loratadine as needed for G-CSF bone pain, and Senna-S as needed for constipation.  She does report having acute systolic heart failure and follows with cardiology under Dr. Gardenia. She seems them every 3 months and they are aware of her cancer diagnosis. Dr. Loretha aware of heart condition. Recommended patient hold ashwagandha until chemotherapy finishes and avoid marijuana  use Clinical pharmacy will assist Dr. Amber Loretha and [name] on an as needed basis going forward  [name] participated in the discussion, expressed understanding, and voiced agreement with the above plan. All questions were answered to her satisfaction. The patient was advised to contact the clinic at [phone] with any questions or concerns prior to her return visit.   I spent 60 minutes assessing the patient.  Breigh Annett A. Lucila, PharmD, BCOP, CPP  Norleen DELENA Lucila, RPH-CPP, 05/15/2024 11:42 AM  **Disclaimer: This note was dictated with voice recognition software. Similar sounding words can inadvertently be transcribed and this note may contain transcription errors which may not have been corrected upon publication of note.**

## 2024-05-21 ENCOUNTER — Encounter: Payer: Self-pay | Admitting: Nurse Practitioner

## 2024-05-21 ENCOUNTER — Telehealth: Payer: Self-pay | Admitting: Nurse Practitioner

## 2024-05-21 ENCOUNTER — Other Ambulatory Visit: Payer: Self-pay | Admitting: Hematology and Oncology

## 2024-05-21 ENCOUNTER — Inpatient Hospital Stay

## 2024-05-21 ENCOUNTER — Encounter: Payer: Self-pay | Admitting: Hematology and Oncology

## 2024-05-21 ENCOUNTER — Inpatient Hospital Stay: Attending: Hematology and Oncology

## 2024-05-21 ENCOUNTER — Inpatient Hospital Stay: Admitting: Licensed Clinical Social Worker

## 2024-05-21 ENCOUNTER — Inpatient Hospital Stay (HOSPITAL_BASED_OUTPATIENT_CLINIC_OR_DEPARTMENT_OTHER): Admitting: Adult Health

## 2024-05-21 VITALS — BP 108/66 | HR 74 | Temp 97.6°F | Resp 17 | Ht 68.0 in | Wt 259.8 lb

## 2024-05-21 VITALS — BP 101/71 | HR 69 | Resp 17

## 2024-05-21 DIAGNOSIS — Z5111 Encounter for antineoplastic chemotherapy: Secondary | ICD-10-CM | POA: Diagnosis present

## 2024-05-21 DIAGNOSIS — C50212 Malignant neoplasm of upper-inner quadrant of left female breast: Secondary | ICD-10-CM | POA: Insufficient documentation

## 2024-05-21 DIAGNOSIS — Z170 Estrogen receptor positive status [ER+]: Secondary | ICD-10-CM

## 2024-05-21 DIAGNOSIS — Z79633 Long term (current) use of mitotic inhibitor: Secondary | ICD-10-CM | POA: Insufficient documentation

## 2024-05-21 DIAGNOSIS — Z79630 Long term (current) use of alkylating agent: Secondary | ICD-10-CM | POA: Diagnosis not present

## 2024-05-21 DIAGNOSIS — Z5189 Encounter for other specified aftercare: Secondary | ICD-10-CM | POA: Insufficient documentation

## 2024-05-21 LAB — CBC WITH DIFFERENTIAL (CANCER CENTER ONLY)
Abs Immature Granulocytes: 0.03 10*3/uL (ref 0.00–0.07)
Basophils Absolute: 0 10*3/uL (ref 0.0–0.1)
Basophils Relative: 0 %
Eosinophils Absolute: 0 10*3/uL (ref 0.0–0.5)
Eosinophils Relative: 0 %
HCT: 45.4 % (ref 36.0–46.0)
Hemoglobin: 15 g/dL (ref 12.0–15.0)
Immature Granulocytes: 0 %
Lymphocytes Relative: 19 %
Lymphs Abs: 1.5 10*3/uL (ref 0.7–4.0)
MCH: 26.9 pg (ref 26.0–34.0)
MCHC: 33 g/dL (ref 30.0–36.0)
MCV: 81.4 fL (ref 80.0–100.0)
Monocytes Absolute: 0.1 10*3/uL (ref 0.1–1.0)
Monocytes Relative: 1 %
Neutro Abs: 6.4 10*3/uL (ref 1.7–7.7)
Neutrophils Relative %: 80 %
Platelet Count: 274 10*3/uL (ref 150–400)
RBC: 5.58 MIL/uL — ABNORMAL HIGH (ref 3.87–5.11)
RDW: 14.4 % (ref 11.5–15.5)
WBC Count: 8.1 10*3/uL (ref 4.0–10.5)
nRBC: 0 % (ref 0.0–0.2)

## 2024-05-21 LAB — CMP (CANCER CENTER ONLY)
ALT: 13 U/L (ref 0–44)
AST: 15 U/L (ref 15–41)
Albumin: 4.2 g/dL (ref 3.5–5.0)
Alkaline Phosphatase: 75 U/L (ref 38–126)
Anion gap: 9 (ref 5–15)
BUN: 16 mg/dL (ref 6–20)
CO2: 24 mmol/L (ref 22–32)
Calcium: 10 mg/dL (ref 8.9–10.3)
Chloride: 107 mmol/L (ref 98–111)
Creatinine: 1.1 mg/dL — ABNORMAL HIGH (ref 0.44–1.00)
GFR, Estimated: 59 mL/min — ABNORMAL LOW (ref >60)
Glucose, Bld: 131 mg/dL — ABNORMAL HIGH (ref 70–99)
Potassium: 4.3 mmol/L (ref 3.5–5.1)
Sodium: 140 mmol/L (ref 135–145)
Total Bilirubin: 0.4 mg/dL (ref 0.0–1.2)
Total Protein: 7.6 g/dL (ref 6.5–8.1)

## 2024-05-21 MED ORDER — SODIUM CHLORIDE 0.9 % IV SOLN
600.0000 mg/m2 | Freq: Once | INTRAVENOUS | Status: AC
  Administered 2024-05-21: 1500 mg via INTRAVENOUS
  Filled 2024-05-21: qty 75

## 2024-05-21 MED ORDER — SODIUM CHLORIDE 0.9 % IV SOLN: INTRAVENOUS | Status: DC

## 2024-05-21 MED ORDER — DEXAMETHASONE SODIUM PHOSPHATE 10 MG/ML IJ SOLN
10.0000 mg | Freq: Once | INTRAMUSCULAR | Status: AC
  Administered 2024-05-21: 10 mg via INTRAVENOUS
  Filled 2024-05-21: qty 1

## 2024-05-21 MED ORDER — PALONOSETRON HCL INJECTION 0.25 MG/5ML
0.2500 mg | Freq: Once | INTRAVENOUS | Status: AC
  Administered 2024-05-21: 0.25 mg via INTRAVENOUS
  Filled 2024-05-21: qty 5

## 2024-05-21 MED ORDER — DEXAMETHASONE 4 MG PO TABS: 8.0000 mg | ORAL_TABLET | Freq: Once | ORAL | Status: DC

## 2024-05-21 MED ORDER — SODIUM CHLORIDE 0.9 % IV SOLN
75.0000 mg/m2 | Freq: Once | INTRAVENOUS | Status: AC
  Administered 2024-05-21: 179 mg via INTRAVENOUS
  Filled 2024-05-21: qty 17.9

## 2024-05-21 NOTE — Patient Instructions (Signed)
 [facility] - A DEPT OF [facility]  Discharge Instructions: Thank you for choosing [facility] to provide your oncology and hematology care.   If you have a lab appointment with the Cancer Center, please go directly to the Cancer Center and check in at the registration area.   Wear comfortable clothing and clothing appropriate for easy access to any Portacath or PICC line.   We strive to give you quality time with your provider. You may need to reschedule your appointment if you arrive late (15 or more minutes).  Arriving late affects you and other patients whose appointments are after yours.  Also, if you miss three or more appointments without notifying the office, you may be dismissed from the clinic at the provider's discretion.      For prescription refill requests, have your pharmacy contact our office and allow 72 hours for refills to be completed.    Today you received the following chemotherapy and/or immunotherapy agents Taxotere & Cytoxan      To help prevent nausea and vomiting after your treatment, we encourage you to take your nausea medication as directed.  BELOW ARE SYMPTOMS THAT SHOULD BE REPORTED IMMEDIATELY: *FEVER GREATER THAN 100.4 F (38 C) OR HIGHER *CHILLS OR SWEATING *NAUSEA AND VOMITING THAT IS NOT CONTROLLED WITH YOUR NAUSEA MEDICATION *UNUSUAL SHORTNESS OF BREATH *UNUSUAL BRUISING OR BLEEDING *URINARY PROBLEMS (pain or burning when urinating, or frequent urination) *BOWEL PROBLEMS (unusual diarrhea, constipation, pain near the anus) TENDERNESS IN MOUTH AND THROAT WITH OR WITHOUT PRESENCE OF ULCERS (sore throat, sores in mouth, or a toothache) UNUSUAL RASH, SWELLING OR PAIN  UNUSUAL VAGINAL DISCHARGE OR ITCHING   Items with * indicate a potential emergency and should be followed up as soon as possible or go to the Emergency Department if any problems should occur.  Please show the CHEMOTHERAPY ALERT CARD or  IMMUNOTHERAPY ALERT CARD at check-in to the Emergency Department and triage nurse.  Should you have questions after your visit or need to cancel or reschedule your appointment, please contact [facility] - A DEPT OF [facility]  Dept: [phone]  and follow the prompts.  Office hours are 8:00 a.m. to 4:30 p.m. Monday - Friday. Please note that voicemails left after 4:00 p.m. may not be returned until the following business day.  We are closed weekends and major holidays. You have access to a nurse at all times for urgent questions. Please call the main number to the clinic Dept: [phone] and follow the prompts.   For any non-urgent questions, you may also contact your provider using MyChart. We now offer e-Visits for anyone 81 and older to request care online for non-urgent symptoms. For details visit [email or web address].   Also download the MyChart app! Go to the app store, search MyChart, open the app, select [facility], and log in with your MyChart username and password.  Docetaxel Injection What is this medication? DOCETAXEL (doe se TAX el) treats some types of cancer. It works by slowing down the growth of cancer cells. This medicine may be used for other purposes; ask your health care provider or pharmacist if you have questions. COMMON BRAND NAME(S): BEIZRAY, Docefrez, Docivyx, Taxotere What should I tell my care team before I take this medication? They need to know if you have any of these conditions: Kidney disease Liver disease Low white blood cell levels Tingling of the fingers or toes  or other nerve disorder An unusual or allergic reaction to docetaxel, polysorbate 80, other medications, foods, dyes, or preservatives Pregnant or trying to get pregnant Breast-feeding How should I use this medication? This medication is injected into a vein. It is given by your care team in a hospital or clinic setting. Talk to your care team about the  use of this medication in children. Special care may be needed. Overdosage: If you think you have taken too much of this medicine contact a poison control center or emergency room at once. NOTE: This medicine is only for you. Do not share this medicine with others. What if I miss a dose? Keep appointments for follow-up doses. It is important not to miss your dose. Call your care team if you are unable to keep an appointment. What may interact with this medication? Do not take this medication with any of the following: Live virus vaccines This medication may also interact with the following: Certain antibiotics, such as clarithromycin, telithromycin Certain antivirals for HIV or hepatitis Certain medications for fungal infections, such as itraconazole, ketoconazole, voriconazole Grapefruit juice Nefazodone Supplements, such as St. John's wort This list may not describe all possible interactions. Give your health care provider a list of all the medicines, herbs, non-prescription drugs, or dietary supplements you use. Also tell them if you smoke, drink alcohol, or use illegal drugs. Some items may interact with your medicine. What should I watch for while using this medication? This medication may make you feel generally unwell. This is not uncommon as chemotherapy can affect healthy cells as well as cancer cells. Report any side effects. Continue your course of treatment even though you feel ill unless your care team tells you to stop. You may need blood work done while you are taking this medication. This medication can cause serious side effects and infusion reactions. To reduce the risk, your care team may give you other medications to take before receiving this one. Be sure to follow the directions from your care team. This medication may increase your risk of getting an infection. Call your care team for advice if you get a fever, chills, sore throat, or other symptoms of a cold or flu. Do not  treat yourself. Try to avoid being around people who are sick. Avoid taking medications that contain aspirin , acetaminophen , ibuprofen, naproxen, or ketoprofen unless instructed by your care team. These medications may hide a fever. Be careful brushing or flossing your teeth or using a toothpick because you may get an infection or bleed more easily. If you have any dental work done, tell your dentist you are receiving this medication. Some products may contain alcohol. Ask your care team if this medication contains alcohol. Be sure to tell all care teams you are taking this medicine. Certain medications, like metronidazole  and disulfiram, can cause an unpleasant reaction when taken with alcohol. The reaction includes flushing, headache, nausea, vomiting, sweating, and increased thirst. The reaction can last from 30 minutes to several hours. This medication may affect your coordination, reaction time, or judgement. Do not drive or operate machinery until you know how this medication affects you. Sit up or stand slowly to reduce the risk of dizzy or fainting spells. Drinking alcohol with this medication can increase the risk of these side effects. Talk to your care team about your risk of cancer. You may be more at risk for certain types of cancer if you take this medication. Talk to your care team if you wish to become  pregnant or think you might be pregnant. This medication can cause serious birth defects if taken during pregnancy or if you get pregnant within 2 months after stopping therapy. A negative pregnancy test is required before starting this medication. A reliable form of contraception is recommended while taking this medication and for 2 months after stopping it. Talk to your care team about reliable forms of contraception. Do not breast-feed while taking this medication and for 1 week after stopping therapy. Use a condom during sex and for 4 months after stopping therapy. Tell your care team right  away if you think your partner might be pregnant. This medication can cause serious birth defects. This medication may cause infertility. Talk to your care team if you are concerned about your fertility. What side effects may I notice from receiving this medication? Side effects that you should report to your care team as soon as possible: Allergic reactions--skin rash, itching, hives, swelling of the face, lips, tongue, or throat Change in vision such as blurry vision, seeing halos around lights, vision loss Infection--fever, chills, cough, or sore throat Infusion reactions--chest pain, shortness of breath or trouble breathing, feeling faint or lightheaded Low red blood cell level--unusual weakness or fatigue, dizziness, headache, trouble breathing Pain, tingling, or numbness in the hands or feet Painful swelling, warmth, or redness of the skin, blisters or sores at the infusion site Redness, blistering, peeling, or loosening of the skin, including inside the mouth Sudden or severe stomach pain, bloody diarrhea, fever, nausea, vomiting Swelling of the ankles, hands, or feet Tumor lysis syndrome (TLS)--nausea, vomiting, diarrhea, decrease in the amount of urine, dark urine, unusual weakness or fatigue, confusion, muscle pain or cramps, fast or irregular heartbeat, joint pain Unusual bruising or bleeding Side effects that usually do not require medical attention (report to your care team if they continue or are bothersome): Change in nail shape, thickness, or color Change in taste Hair loss Increased tears This list may not describe all possible side effects. Call your doctor for medical advice about side effects. You may report side effects to FDA at 1-800-FDA-1088. Where should I keep my medication? This medication is given in a hospital or clinic. It will not be stored at home. NOTE: This sheet is a summary. It may not cover all possible information. If you have questions about this  medicine, talk to your doctor, pharmacist, or health care provider.  2024 Elsevier/Gold Standard (2022-01-12 00:00:00)  Cyclophosphamide Injection What is this medication? CYCLOPHOSPHAMIDE (sye kloe FOSS fa mide) treats some types of cancer. It works by slowing down the growth of cancer cells. This medicine may be used for other purposes; ask your health care provider or pharmacist if you have questions. COMMON BRAND NAME(S): Cyclophosphamide, Cytoxan, Neosar What should I tell my care team before I take this medication? They need to know if you have any of these conditions: Heart disease Irregular heartbeat or rhythm Infection Kidney problems Liver disease Low blood cell levels (white cells, platelets, or red blood cells) Lung disease Previous radiation Trouble passing urine An unusual or allergic reaction to cyclophosphamide, other medications, foods, dyes, or preservatives Pregnant or trying to get pregnant Breast-feeding How should I use this medication? This medication is injected into a vein. It is given by your care team in a hospital or clinic setting. Talk to your care team about the use of this medication in children. Special care may be needed. Overdosage: If you think you have taken too much of this medicine contact  a poison control center or emergency room at once. NOTE: This medicine is only for you. Do not share this medicine with others. What if I miss a dose? Keep appointments for follow-up doses. It is important not to miss your dose. Call your care team if you are unable to keep an appointment. What may interact with this medication? Amphotericin B Amiodarone Azathioprine Certain antivirals for HIV or hepatitis Certain medications for blood pressure, such as enalapril, lisinopril, quinapril Cyclosporine Diuretics Etanercept Indomethacin Medications that relax muscles Metronidazole  Natalizumab Tamoxifen Warfarin This list may not describe all possible  interactions. Give your health care provider a list of all the medicines, herbs, non-prescription drugs, or dietary supplements you use. Also tell them if you smoke, drink alcohol, or use illegal drugs. Some items may interact with your medicine. What should I watch for while using this medication? This medication may make you feel generally unwell. This is not uncommon as chemotherapy can affect healthy cells as well as cancer cells. Report any side effects. Continue your course of treatment even though you feel ill unless your care team tells you to stop. You may need blood work while you are taking this medication. This medication may increase your risk of getting an infection. Call your care team for advice if you get a fever, chills, sore throat, or other symptoms of a cold or flu. Do not treat yourself. Try to avoid being around people who are sick. Avoid taking medications that contain aspirin , acetaminophen , ibuprofen, naproxen, or ketoprofen unless instructed by your care team. These medications may hide a fever. Be careful brushing or flossing your teeth or using a toothpick because you may get an infection or bleed more easily. If you have any dental work done, tell your dentist you are receiving this medication. Drink water or other fluids as directed. Urinate often, even at night. Some products may contain alcohol. Ask your care team if this medication contains alcohol. Be sure to tell all care teams you are taking this medicine. Certain medicines, like metronidazole  and disulfiram, can cause an unpleasant reaction when taken with alcohol. The reaction includes flushing, headache, nausea, vomiting, sweating, and increased thirst. The reaction can last from 30 minutes to several hours. Talk to your care team if you wish to become pregnant or think you might be pregnant. This medication can cause serious birth defects if taken during pregnancy and for 1 year after the last dose. A negative  pregnancy test is required before starting this medication. A reliable form of contraception is recommended while taking this medication and for 1 year after the last dose. Talk to your care team about reliable forms of contraception. Do not father a child while taking this medication and for 4 months after the last dose. Use a condom during this time period. Do not breast-feed while taking this medication or for 1 week after the last dose. This medication may cause infertility. Talk to your care team if you are concerned about your fertility. Talk to your care team about your risk of cancer. You may be more at risk for certain types of cancer if you take this medication. What side effects may I notice from receiving this medication? Side effects that you should report to your care team as soon as possible: Allergic reactions--skin rash, itching, hives, swelling of the face, lips, tongue, or throat Dry cough, shortness of breath or trouble breathing Heart failure--shortness of breath, swelling of the ankles, feet, or hands, sudden weight gain, unusual  weakness or fatigue Heart muscle inflammation--unusual weakness or fatigue, shortness of breath, chest pain, fast or irregular heartbeat, dizziness, swelling of the ankles, feet, or hands Heart rhythm changes--fast or irregular heartbeat, dizziness, feeling faint or lightheaded, chest pain, trouble breathing Infection--fever, chills, cough, sore throat, wounds that don't heal, pain or trouble when passing urine, general feeling of discomfort or being unwell Kidney injury--decrease in the amount of urine, swelling of the ankles, hands, or feet Liver injury--right upper belly pain, loss of appetite, nausea, light-colored stool, dark yellow or brown urine, yellowing skin or eyes, unusual weakness or fatigue Low red blood cell level--unusual weakness or fatigue, dizziness, headache, trouble breathing Low sodium level--muscle weakness, fatigue, dizziness,  headache, confusion Red or dark brown urine Unusual bruising or bleeding Side effects that usually do not require medical attention (report to your care team if they continue or are bothersome): Hair loss Irregular menstrual cycles or spotting Loss of appetite Nausea Pain, redness, or swelling with sores inside the mouth or throat Vomiting This list may not describe all possible side effects. Call your doctor for medical advice about side effects. You may report side effects to FDA at 1-800-FDA-1088. Where should I keep my medication? This medication is given in a hospital or clinic. It will not be stored at home. NOTE: This sheet is a summary. It may not cover all possible information. If you have questions about this medicine, talk to your doctor, pharmacist, or health care provider.  2024 Elsevier/Gold Standard (2022-03-24 00:00:00)

## 2024-05-21 NOTE — Progress Notes (Signed)
 [facility] CSW Progress Note  Visual merchandiser met with patient to follow-up on need for community resources and emotional support.  Interventions: Provided brief emotional support and checked on resource connection    Pt reports doing well today during 1st infusion. She was able to have help from her mom and sister in watching the grandchildren this weekend.  Pt was able to get into psychiatry yesterday and had medications refilled. She started with a therapist as well but needs to check on the co-pay amounts.   Pt did not have to utilize Medicaid transportation yet as her daughter is still on break (she is a Runner, broadcasting/film/video) and able to help for now.    Follow Up Plan:  Patient will contact CSW with any support or resource needs    Shariq Puig E Lakisa Lotz, LCSW Clinical Social Worker [facility]

## 2024-05-21 NOTE — Progress Notes (Signed)
 Patient Care Team: Lorren Greig PARAS, NP as PCP - General (Nurse Practitioner) Nahser, Aleene PARAS, MD as PCP - Cardiology (Cardiology) Nahser, Aleene PARAS, MD as Consulting Physician (Cardiology) Glean Stephane BROCKS, RN (Inactive) as Oncology Nurse Navigator Tyree Nanetta SAILOR, RN as Oncology Nurse Navigator Loretha Ash, MD as Consulting Physician (Hematology and Oncology)  Clinic Day:  05/21/2024  Referring physician: Loretha Ash, MD  ASSESSMENT & PLAN:   Assessment & Plan: Malignant neoplasm of upper-inner quadrant of left breast in female, estrogen receptor positive (HCC) Recent surgery removed five lymph nodes, two with small foci of cancer. Margins negative.  High oncotype score of 42 suggests significant chemotherapy benefit. Five-year recurrence risk is 25%. Chemotherapy recommended due to oncotype score and lymph node involvement. Cardiac concerns necessitate reconsideration of initial chemotherapy plan. Discussed chemotherapy side effects and emphasized blood count monitoring and growth factor support. Decision made to consider less intensive regimen due to cardiac dysfunction. - Discussed with Dr Bronson, ok to proceed with TC with close follow up visits with cardiology. - Monitor for side effects and provide supportive care. - chemotherapy education was completed on 05/15/2024 - Postpone radiation until after chemotherapy. - 05/21/2024 - patient presents for Cycle 1 Day 1 chemotherapy TC.  Udenyca scheduled for 05/24/2024. Should be scheduled for Cycle 2 day 1 on 06/19/2024. She does have follow up already scheduled for that day. Echocardiogram scheduled for 06/23/2024.     Plan: Labs reviewed. -CBC within normal limits.  -CMP showing mild elevation of creatinine at 1.10 with eGFR 59. Other values are unremarkable.  Will give missed dose decadron  8mg  in infusion suite. Order placed and RN and pharmacy notified.  Labs and patient presentation are appropriate for treatment today.   Proceed with Cycle 1 day 1 chemotherapy TC Udenyca injection scheduled for 05/24/2024 Labs and follow up in one week for toxicity check. Follow up scheduled for 06/19/2024. Cycle 2 Day 1 chemotherapy should be scheduled for that day. Cycle 2 day 3 should be scheduled 3 days after chemotherapy.     The patient understands the plans discussed today and is in agreement with them.  She knows to contact our office if she develops concerns prior to her next appointment.  I provided 25 minutes of face-to-face time during this encounter and > 50% was spent counseling as documented under my assessment and plan.    Powell FORBES Lessen, NP  [facility] - A DEPT OF [facility] [address] [place] [address] Dept: [phone] Dept Fax: [phone]   No orders of the defined types were placed in this encounter.     CHIEF COMPLAINT:  CC: left breast cancer, estrogen receptor positive  Current Treatment:  chemotherapy TC every 21 days for 4 cycles  INTERVAL HISTORY:  Janet [name] is here today for repeat clinical assessment. She was last seen by Dr. Loretha on 05/09/2024. Today, she presents for Cycle 1 day of chemotherapy TC. She completed chemotherapy education on 05/15/2024. She states that she did forget to take her dose decadron  this morning. States that she was so nervous and caught up with her normal morning routine, that it completely slipped her mind. She is concerned about being fitted for dentures. Had several teeth pulled about 2 months ago. Is supposed to have teeth cleaned and be fitted for dentures in the near future. She understands that this is something that should probably wait until after she finishes treatment with chemotherapy.  Today, she denies chest pain, chest pressure, or shortness of breath. She denies headaches or visual disturbances. She denies abdominal pain, nausea, vomiting, or changes in bowel or bladder  habits.  She denies fevers or chills. She denies pain. Her appetite is good. Her weight has been stable. She states that she was able to rest pretty well last night. She has not had time to eat or drink very much water yet today.   I have reviewed the past medical history, past surgical history, social history and family history with the patient and they are unchanged from previous note.  ALLERGIES:  is allergic to clarithromycin, penicillins, and penicillin g.  MEDICATIONS:  Current Outpatient Medications  Medication Sig Dispense Refill   albuterol  (PROVENTIL ) (2.5 MG/3ML) 0.083% nebulizer solution Take 3 mLs (2.5 mg total) by nebulization every 4 (four) hours as needed for wheezing or shortness of breath. 75 mL 2   albuterol  (VENTOLIN  HFA) 108 (90 Base) MCG/ACT inhaler Inhale 2 puffs into the lungs every 6 (six) hours as needed for wheezing or shortness of breath. 18 g 2   ASHWAGANDHA PO Take by mouth at bedtime as needed (for sleep).     Budeson-Glycopyrrol-Formoterol  (BREZTRI  AEROSPHERE) 160-9-4.8 MCG/ACT AERO Inhale 2 puffs into the lungs in the morning and at bedtime. 10.7 g 5   carvedilol  (COREG ) 12.5 MG tablet Take 1 tablet (12.5 mg total) by mouth 2 (two) times daily with a meal. 60 tablet 11   dapagliflozin  propanediol (FARXIGA ) 10 MG TABS tablet Take 1 tablet (10 mg total) by mouth daily before breakfast. 30 tablet 5   dexamethasone  (DECADRON ) 4 MG tablet Take 2 tabs by mouth 2 times daily starting day before chemo. Then take 2 tabs daily for 2 days starting day after chemo. Take with food. 30 tablet 1   DULoxetine (CYMBALTA) 30 MG capsule Take 30 mg by mouth daily.     fluticasone  (FLONASE ) 50 MCG/ACT nasal spray Place 2 sprays into both nostrils daily. 48 g 0   furosemide  (LASIX ) 20 MG tablet TAKE ONE TABLET (20 MG) BY MOUTH DAILY AT 9AM 30 tablet 11   hydrOXYzine  (VISTARIL ) 100 MG capsule Take 100 mg by mouth at bedtime.     ibuprofen (ADVIL) 800 MG tablet Take 800 mg by mouth every  6 (six) hours as needed for fever, headache or mild pain (pain score 1-3).     montelukast  (SINGULAIR ) 10 MG tablet TAKE ONE TABLET (10 MG) BY MOUTH DAILY AT 9PM AT BEDTIME 90 tablet 0   Multiple Vitamin (MULTIVITAMIN) tablet Take 1 tablet by mouth daily.     ondansetron  (ZOFRAN ) 8 MG tablet Take 1 tablet (8 mg total) by mouth every 8 (eight) hours as needed for nausea or vomiting. Start on the third day after chemotherapy. 30 tablet 1   prochlorperazine  (COMPAZINE ) 10 MG tablet Take 1 tablet (10 mg total) by mouth every 6 (six) hours as needed for nausea or vomiting. 30 tablet 1   sacubitril -valsartan  (ENTRESTO ) 97-103 MG Take 1 tablet by mouth 2 (two) times daily. 60 tablet 3   spironolactone  (ALDACTONE ) 25 MG tablet TAKE ONE TABLET (25 MG) BY MOUTH DAILY 90 tablet 0   oxyCODONE  (OXY IR/ROXICODONE ) 5 MG immediate release tablet Take 1 tablet (5 mg total) by mouth every 6 (six) hours as needed for severe pain (pain score 7-10). (Patient not taking: Reported on 05/21/2024) 15 tablet 0   traZODone  (DESYREL ) 150 MG tablet Take 150 mg by mouth at bedtime. (Patient not taking:  Reported on 05/21/2024)     No current facility-administered medications for this visit.   Facility-Administered Medications Ordered in Other Visits  Medication Dose Route Frequency Provider Last Rate Last Admin   0.9 %  sodium chloride  infusion   Intravenous Continuous Iruku, Praveena, MD 10 mL/hr at 05/21/24 1121 New Bag at 05/21/24 1121   cyclophosphamide (CYTOXAN) 1,500 mg in sodium chloride  0.9 % 250 mL chemo infusion  600 mg/m2 (Treatment Plan Recorded) Intravenous Once Iruku, Praveena, MD       DOCEtaxel (TAXOTERE) 179 mg in sodium chloride  0.9 % 250 mL chemo infusion  75 mg/m2 (Treatment Plan Recorded) Intravenous Once Iruku, Praveena, MD 201 mL/hr at 05/21/24 1259 Rate Change at 05/21/24 1259    HISTORY OF PRESENT ILLNESS:   Oncology History  Malignant neoplasm of upper-inner quadrant of left breast in female, estrogen  receptor positive (HCC)  10/11/2023 Mammogram   There is a dominant solid mass at the palpable site of concern in the left breast at 6 o'clock measuring 4.2 cm. There are multiple other smaller solid-appearing masses in the left breast at 8 o'clock, 9 o'clock and 10 o'clock. No evidence of left axillary lymphadenopathy. No evidence of malignancy in the right breast.   03/11/2024 Initial Diagnosis   Malignant neoplasm of upper-inner quadrant of left breast in female, estrogen receptor positive (HCC)   03/11/2024 Cancer Staging   Staging form: Breast, AJCC 8th Edition - Pathologic: Stage IB (pT3, pN1(sn), cM0, G2, ER+, PR+, HER2-) - Signed by Loretha Ash, MD on 05/09/2024 Method of lymph node assessment: Sentinel lymph node biopsy Histologic grading system: 3 grade system   03/13/2024 Pathology Results   Left lumpectomy  Papillary carcinoma with invasion, 5.1 cm, grade 2  Ductal carcinoma in situ: Cribriform, nuclear grade 2  Margins, invasive: Positive      Closest, invasive: Posterior and medial  Margins, DCIS: Negative      Closest, DCIS: 1 mm, superior and anterior  Lymphovascular invasion: Not identified   The tumor cells are NEGATIVE for Her2 (1+).   Estrogen Receptor:       95%, POSTIVE, STRONG STAINING INTENSITY  Progesterone Receptor:   10%, POSITIVE, MODERATE STAINING INTENSITY  Proliferation Marker Ki-67:   20%    05/21/2024 -  Chemotherapy   Patient is on Treatment Plan : BREAST TC q21d         REVIEW OF SYSTEMS:   Constitutional: Denies fevers, chills or abnormal weight loss Eyes: Denies blurriness of vision Ears, nose, mouth, throat, and face: Denies mucositis or sore throat Respiratory: Denies cough, dyspnea or wheezes Cardiovascular: Denies palpitation, chest discomfort or lower extremity swelling Gastrointestinal:  Denies nausea, heartburn or change in bowel habits Skin: Denies abnormal skin rashes Lymphatics: Denies new lymphadenopathy or easy  bruising Neurological:Denies numbness, tingling or new weaknesses Behavioral/Psych: Mood is stable, no new changes  All other systems were reviewed with the patient and are negative.   VITALS:   Today's Vitals   05/21/24 1008 05/21/24 1017  BP: 108/66   Pulse: 74   Resp: 17   Temp: 97.6 F (36.4 C)   TempSrc: Temporal   SpO2: 98%   Weight: 259 lb 12.8 oz (117.8 kg)   Height: 5' 8 (1.727 m)   PainSc:  0-No pain   Body mass index is 39.5 kg/m.   Wt Readings from Last 3 Encounters:  05/21/24 259 lb 12.8 oz (117.8 kg)  05/09/24 260 lb 12.8 oz (118.3 kg)  04/28/24 264 lb 3.2 oz (119.8  kg)    Body mass index is 39.5 kg/m.  Performance status (ECOG): 0 - Asymptomatic  PHYSICAL EXAM:   GENERAL:alert, no distress and comfortable SKIN: skin color, texture, turgor are normal, no rashes or significant lesions EYES: normal, Conjunctiva are pink and non-injected, sclera clear OROPHARYNX:no exudate, no erythema and lips, buccal mucosa, and tongue normal  NECK: supple, thyroid  normal size, non-tender, without nodularity LYMPH:  no palpable lymphadenopathy in the cervical, axillary or inguinal LUNGS: clear to auscultation and percussion with normal breathing effort HEART: regular rate & rhythm. No lower extremity edema. She does have soft, blowing, systolic murmur auscultated today.  ABDOMEN:abdomen soft, non-tender and normal bowel sounds Musculoskeletal:no cyanosis of digits and no clubbing  NEURO: alert & oriented x 3 with fluent speech, no focal motor/sensory deficits  LABORATORY DATA:  I have reviewed the data as listed    Component Value Date/Time   NA 140 05/21/2024 0935   NA 143 11/20/2023 1135   K 4.3 05/21/2024 0935   CL 107 05/21/2024 0935   CO2 24 05/21/2024 0935   GLUCOSE 131 (H) 05/21/2024 0935   BUN 16 05/21/2024 0935   BUN 7 11/20/2023 1135   CREATININE 1.10 (H) 05/21/2024 0935   CALCIUM 10.0 05/21/2024 0935   PROT 7.6 05/21/2024 0935   PROT 6.0  01/22/2023 1017   ALBUMIN 4.2 05/21/2024 0935   ALBUMIN 3.8 01/22/2023 1017   AST 15 05/21/2024 0935   ALT 13 05/21/2024 0935   ALKPHOS 75 05/21/2024 0935   BILITOT 0.4 05/21/2024 0935   GFRNONAA 59 (L) 05/21/2024 0935    Lab Results  Component Value Date   WBC 8.1 05/21/2024   NEUTROABS 6.4 05/21/2024   HGB 15.0 05/21/2024   HCT 45.4 05/21/2024   MCV 81.4 05/21/2024   PLT 274 05/21/2024

## 2024-05-21 NOTE — Assessment & Plan Note (Addendum)
 Recent surgery removed five lymph nodes, two with small foci of cancer. Margins negative.  High oncotype score of 42 suggests significant chemotherapy benefit. Five-year recurrence risk is 25%. Chemotherapy recommended due to oncotype score and lymph node involvement. Cardiac concerns necessitate reconsideration of initial chemotherapy plan. Discussed chemotherapy side effects and emphasized blood count monitoring and growth factor support. Decision made to consider less intensive regimen due to cardiac dysfunction. - Discussed with Dr Bronson, ok to proceed with TC with close follow up visits with cardiology. - Monitor for side effects and provide supportive care. - chemotherapy education was completed on 05/15/2024 - Postpone radiation until after chemotherapy. - 05/21/2024 - patient presents for Cycle 1 Day 1 chemotherapy TC.  Udenyca scheduled for 05/24/2024. Should be scheduled for Cycle 2 day 1 on 06/19/2024. She does have follow up already scheduled for that day. Echocardiogram scheduled for 06/23/2024.

## 2024-05-21 NOTE — Telephone Encounter (Signed)
 Scheduled appointments per 7/2 los. Talked with the patient and she is aware of the made appointments.

## 2024-05-21 NOTE — Progress Notes (Signed)
 Ok to proceed with dexamethasone  10mg  as premed and no additional dexamethasone  per Powell, NP

## 2024-05-22 ENCOUNTER — Encounter: Payer: Self-pay | Admitting: Hematology and Oncology

## 2024-05-22 ENCOUNTER — Telehealth: Payer: Self-pay

## 2024-05-22 NOTE — Telephone Encounter (Signed)
-----   Message from Nurse Rudell CROME sent at 05/21/2024  3:05 PM EDT ----- Regarding: Iruku 1st Tx F/U call - Taxotere/Cytoxan Iruku 1st Tx F/U call - Taxotere/Cytoxan.  Tolerated infusion well.

## 2024-05-22 NOTE — Telephone Encounter (Signed)
 Janet [name] states that she is doing fine. She is eating, drinking, and urinating well. She knows to call the office at [phone] if  she has any questions or concerns.

## 2024-05-24 ENCOUNTER — Inpatient Hospital Stay

## 2024-05-24 VITALS — BP 103/81 | HR 66 | Temp 97.9°F | Resp 13

## 2024-05-24 DIAGNOSIS — C50212 Malignant neoplasm of upper-inner quadrant of left female breast: Secondary | ICD-10-CM | POA: Diagnosis not present

## 2024-05-24 DIAGNOSIS — Z170 Estrogen receptor positive status [ER+]: Secondary | ICD-10-CM

## 2024-05-24 DIAGNOSIS — Z5111 Encounter for antineoplastic chemotherapy: Secondary | ICD-10-CM | POA: Diagnosis not present

## 2024-05-24 DIAGNOSIS — Z79633 Long term (current) use of mitotic inhibitor: Secondary | ICD-10-CM | POA: Diagnosis not present

## 2024-05-24 DIAGNOSIS — Z5189 Encounter for other specified aftercare: Secondary | ICD-10-CM | POA: Diagnosis not present

## 2024-05-24 DIAGNOSIS — Z79630 Long term (current) use of alkylating agent: Secondary | ICD-10-CM | POA: Diagnosis not present

## 2024-05-24 MED ORDER — PEGFILGRASTIM-CBQV 6 MG/0.6ML ~~LOC~~ SOSY
6.0000 mg | PREFILLED_SYRINGE | Freq: Once | SUBCUTANEOUS | Status: AC
  Administered 2024-05-24: 6 mg via SUBCUTANEOUS
  Filled 2024-05-24: qty 0.6

## 2024-05-26 ENCOUNTER — Encounter: Payer: Self-pay | Admitting: Genetic Counselor

## 2024-05-26 ENCOUNTER — Telehealth: Payer: Self-pay | Admitting: Genetic Counselor

## 2024-05-26 DIAGNOSIS — Z1379 Encounter for other screening for genetic and chromosomal anomalies: Secondary | ICD-10-CM | POA: Insufficient documentation

## 2024-05-26 NOTE — Telephone Encounter (Signed)
 I contacted Ms. Cubillos to discuss her genetic testing results. No pathogenic variants were identified in the 77 genes analyzed. Of note, a variant of uncertain significance was identified in the GATA2 gene. Detailed clinic note to follow.  The test report has been scanned into EPIC and is located under the Molecular Pathology section of the Results Review tab.  A portion of the result report is included below for reference.   Caressa Scearce, MS, [facility] Genetic Counselor [email or web address] (P) [phone]

## 2024-05-27 ENCOUNTER — Emergency Department (HOSPITAL_COMMUNITY)

## 2024-05-27 ENCOUNTER — Encounter: Payer: Self-pay | Admitting: *Deleted

## 2024-05-27 ENCOUNTER — Emergency Department (HOSPITAL_COMMUNITY)
Admission: EM | Admit: 2024-05-27 | Discharge: 2024-05-28 | Disposition: A | Discharge status: Home / Self Care | Attending: Emergency Medicine | Admitting: Emergency Medicine

## 2024-05-27 ENCOUNTER — Encounter (HOSPITAL_COMMUNITY): Payer: Self-pay | Admitting: Emergency Medicine

## 2024-05-27 ENCOUNTER — Other Ambulatory Visit: Payer: Self-pay

## 2024-05-27 DIAGNOSIS — C50919 Malignant neoplasm of unspecified site of unspecified female breast: Secondary | ICD-10-CM | POA: Insufficient documentation

## 2024-05-27 DIAGNOSIS — K2900 Acute gastritis without bleeding: Secondary | ICD-10-CM | POA: Diagnosis not present

## 2024-05-27 DIAGNOSIS — R109 Unspecified abdominal pain: Secondary | ICD-10-CM | POA: Diagnosis not present

## 2024-05-27 LAB — URINALYSIS, ROUTINE W REFLEX MICROSCOPIC
Bilirubin Urine: NEGATIVE
Glucose, UA: >=500 mg/dL — AB
Hgb urine dipstick: NEGATIVE
Ketones, ur: NEGATIVE mg/dL
Leukocytes,Ua: NEGATIVE
Nitrite: NEGATIVE
Protein, ur: 30 mg/dL — AB
Specific Gravity, Urine: 1.021 (ref 1.005–1.030)
pH: 6 (ref 5.0–8.0)

## 2024-05-27 LAB — CBC WITH DIFFERENTIAL/PLATELET
Abs Immature Granulocytes: 0.28 K/uL — ABNORMAL HIGH (ref 0.00–0.07)
Basophils Absolute: 0.1 K/uL (ref 0.0–0.1)
Basophils Relative: 2 %
Eosinophils Absolute: 0.1 K/uL (ref 0.0–0.5)
Eosinophils Relative: 3 %
HCT: 42.8 % (ref 36.0–46.0)
Hemoglobin: 13.8 g/dL (ref 12.0–15.0)
Immature Granulocytes: 9 %
Lymphocytes Relative: 67 %
Lymphs Abs: 2.1 K/uL (ref 0.7–4.0)
MCH: 27 pg (ref 26.0–34.0)
MCHC: 32.2 g/dL (ref 30.0–36.0)
MCV: 83.8 fL (ref 80.0–100.0)
Monocytes Absolute: 0.4 K/uL (ref 0.1–1.0)
Monocytes Relative: 11 %
Neutro Abs: 0.3 K/uL — CL (ref 1.7–7.7)
Neutrophils Relative %: 8 %
Platelets: 186 K/uL (ref 150–400)
RBC: 5.11 MIL/uL (ref 3.87–5.11)
RDW: 14 % (ref 11.5–15.5)
WBC: 3.3 K/uL — ABNORMAL LOW (ref 4.0–10.5)
nRBC: 0.6 % — ABNORMAL HIGH (ref 0.0–0.2)

## 2024-05-27 LAB — COMPREHENSIVE METABOLIC PANEL WITH GFR
ALT: 25 U/L (ref 0–44)
AST: 22 U/L (ref 15–41)
Albumin: 3.4 g/dL — ABNORMAL LOW (ref 3.5–5.0)
Alkaline Phosphatase: 62 U/L (ref 38–126)
Anion gap: 12 (ref 5–15)
BUN: 16 mg/dL (ref 6–20)
CO2: 23 mmol/L (ref 22–32)
Calcium: 9.1 mg/dL (ref 8.9–10.3)
Chloride: 101 mmol/L (ref 98–111)
Creatinine, Ser: 1.12 mg/dL — ABNORMAL HIGH (ref 0.44–1.00)
GFR, Estimated: 58 mL/min — ABNORMAL LOW (ref >60)
Glucose, Bld: 108 mg/dL — ABNORMAL HIGH (ref 70–99)
Potassium: 4.2 mmol/L (ref 3.5–5.1)
Sodium: 136 mmol/L (ref 135–145)
Total Bilirubin: 0.7 mg/dL (ref 0.0–1.2)
Total Protein: 6.5 g/dL (ref 6.5–8.1)

## 2024-05-27 LAB — LIPASE, BLOOD: Lipase: 27 U/L (ref 11–51)

## 2024-05-27 MED ORDER — IOHEXOL 350 MG/ML SOLN
75.0000 mL | Freq: Once | INTRAVENOUS | Status: AC | PRN
  Administered 2024-05-27: 75 mL via INTRAVENOUS

## 2024-05-27 NOTE — ED Notes (Signed)
 Istat LAC 1.3

## 2024-05-27 NOTE — ED Notes (Signed)
 1st lac results were 1.28 within normal range, 2nd not needed can be discontinued. Results did not cross over into pt chart. Have hard receipt copy in mini lab

## 2024-05-27 NOTE — ED Provider Triage Note (Signed)
 Emergency Medicine Provider Triage Evaluation Note  Janet [name] , a 58 y.o. female  was evaluated in triage.  Pt complains of abdominal pain. Patient with breast cancer, just started chemo on 7/2. Pain started soon after this, is generalized throughout her abdomen and does not radiate.  Denies history of similar pain previously.  No history of abdominal surgeries.  No nausea, vomiting, or diarrhea.  Denies fevers or chills.  No urinary symptoms.  No cough or congestion.  Review of Systems  Positive:  Negative:   Physical Exam  BP 105/75 (BP Location: Right Arm)   Pulse 97   Temp 100 F (37.8 C)   Resp 20   Ht 5' 8 (1.727 m)   Wt 117 kg   SpO2 95%   BMI 39.22 kg/m  Gen:   Awake, no distress   Resp:  Normal effort  MSK:   Moves extremities without difficulty  Other:  Generalized abdominal TTP  Medical Decision Making  Medically screening exam initiated at 9:06 PM.  Appropriate orders placed.  Janet [name] was informed that the remainder of the evaluation will be completed by another provider, this initial triage assessment does not replace that evaluation, and the importance of remaining in the ED until their evaluation is complete.  Borderline sepsis criteria, temp 100, HR 97, resp 20, will add lactic and continue to monitor   Janet Lauraine DELENA, PA-C 05/27/24 2108

## 2024-05-27 NOTE — ED Triage Notes (Addendum)
 Patient here after first chemo infusion (hx of breast cancer) on 7/2 and injection on Saturday and now c/o abdominal spasming starting on 7/4. Has tried Oxycodone  with no measurable improvement. Denies n/v/d, chest pain, SHOB. Endorses adequate PO intake despite the pain.

## 2024-05-28 ENCOUNTER — Telehealth: Payer: Self-pay

## 2024-05-28 ENCOUNTER — Inpatient Hospital Stay (HOSPITAL_BASED_OUTPATIENT_CLINIC_OR_DEPARTMENT_OTHER): Admitting: Nurse Practitioner

## 2024-05-28 ENCOUNTER — Inpatient Hospital Stay

## 2024-05-28 DIAGNOSIS — Z170 Estrogen receptor positive status [ER+]: Secondary | ICD-10-CM | POA: Diagnosis not present

## 2024-05-28 DIAGNOSIS — C50212 Malignant neoplasm of upper-inner quadrant of left female breast: Secondary | ICD-10-CM | POA: Diagnosis not present

## 2024-05-28 DIAGNOSIS — K2900 Acute gastritis without bleeding: Secondary | ICD-10-CM | POA: Diagnosis not present

## 2024-05-28 LAB — PATHOLOGIST SMEAR REVIEW

## 2024-05-28 LAB — I-STAT CG4 LACTIC ACID, ED: Lactic Acid, Venous: 1.3 mmol/L (ref 0.5–1.9)

## 2024-05-28 MED ORDER — SUCRALFATE 1 GM/10ML PO SUSP: 1.0000 g | Freq: Three times a day (TID) | ORAL | 0 refills | Status: DC

## 2024-05-28 MED ORDER — PANTOPRAZOLE SODIUM 20 MG PO TBEC: 20.0000 mg | DELAYED_RELEASE_TABLET | Freq: Every day | ORAL | 0 refills | Status: DC

## 2024-05-28 MED ORDER — DICYCLOMINE HCL 10 MG PO CAPS
10.0000 mg | ORAL_CAPSULE | Freq: Once | ORAL | Status: AC
  Administered 2024-05-28: 10 mg via ORAL
  Filled 2024-05-28: qty 1

## 2024-05-28 MED ORDER — FAMOTIDINE 20 MG PO TABS
20.0000 mg | ORAL_TABLET | Freq: Once | ORAL | Status: AC
  Administered 2024-05-28: 20 mg via ORAL
  Filled 2024-05-28: qty 1

## 2024-05-28 MED ORDER — DICYCLOMINE HCL 20 MG PO TABS: 20.0000 mg | ORAL_TABLET | Freq: Two times a day (BID) | ORAL | 0 refills | Status: DC | PRN

## 2024-05-28 MED ORDER — ALUM & MAG HYDROXIDE-SIMETH 200-200-20 MG/5ML PO SUSP
30.0000 mL | Freq: Once | ORAL | Status: AC
  Administered 2024-05-28: 30 mL via ORAL
  Filled 2024-05-28: qty 30

## 2024-05-28 MED ORDER — FAMOTIDINE 20 MG PO TABS: 20.0000 mg | ORAL_TABLET | Freq: Two times a day (BID) | ORAL | 0 refills | Status: DC

## 2024-05-28 MED ORDER — LIDOCAINE VISCOUS HCL 2 % MT SOLN
15.0000 mL | Freq: Once | OROMUCOSAL | Status: AC
  Administered 2024-05-28: 15 mL via ORAL
  Filled 2024-05-28: qty 15

## 2024-05-28 MED ORDER — PANTOPRAZOLE SODIUM 40 MG IV SOLR
40.0000 mg | Freq: Once | INTRAVENOUS | Status: AC
  Administered 2024-05-28: 40 mg via INTRAVENOUS
  Filled 2024-05-28: qty 10

## 2024-05-28 NOTE — Telephone Encounter (Signed)
 Received fax from [facility] reporting that pt called last night 7/8 at 1941 reporting abd pain which radiated to her back. She was advised to go to ED and while she was there had a CT abd which showed gastritis. She has a phone visit today with Powell Lessen, NP to further discuss. Pt states she is starting to feel better and is waiting for home meds to be filled at [facility]. She knows to call with any further concerns.

## 2024-05-28 NOTE — Assessment & Plan Note (Addendum)
 Recent surgery removed five lymph nodes, two with small foci of cancer. Margins negative.  High oncotype score of 42 suggests significant chemotherapy benefit. Five-year recurrence risk is 25%. Chemotherapy recommended due to oncotype score and lymph node involvement. Cardiac concerns necessitate reconsideration of initial chemotherapy plan. Discussed chemotherapy side effects and emphasized blood count monitoring and growth factor support. Decision made to consider less intensive regimen due to cardiac dysfunction. - Discussed with Dr Bronson, ok to proceed with TC with close follow up visits with cardiology. - Monitor for side effects and provide supportive care. - chemotherapy education was completed on 05/15/2024 - Postpone radiation until after chemotherapy. - 05/21/2024 - Cycle 1 Day 1 chemotherapy TC.  Udenyca  given on 05/24/2024.  - 05/27/2024 - patient seen in ED for abdominal pain and reduced appetite.  Diagnosed with acute gastritis.  Started on pantoprazole  daily, Pepcid  twice daily, and sucralfate  3 times daily with meals and at bedtime. Echocardiogram scheduled for 06/23/2024.  Plan to proceed with cycle 2 day 1 chemotherapy TC as scheduled on 06/13/2024.

## 2024-05-28 NOTE — ED Provider Notes (Signed)
 [place] EMERGENCY DEPARTMENT AT [facility] Provider Note   CSN: [id number] Arrival date & time: 05/27/24  2043     Patient presents with: Abdominal Pain   Janet [name] is a 58 y.o. female.   55 your female with a history of breast cancer currently starting chemo who presents the ER today secondary to abdominal pain.  States that seems to be worse in the upper abdomen.  Better with some fluids worse with other foods for example cannot eat fried chicken or [place] food as it makes it a lot worse however milk and bread and eggs do not fight that is much.  Has not tried thing for symptoms.  No nausea or vomiting.  She had some hard stools but was not really constipated she tried the MiraLAX to see if maybe she was her was gas but did not seem to help.  Her first chemo treatment last week and then it sounds like probably some type of neutrophil stimulating treatment a couple days afterwards.   Abdominal Pain      Prior to Admission medications   Medication Sig Start Date End Date Taking? Authorizing Provider  dicyclomine  (BENTYL ) 20 MG tablet Take 1 tablet (20 mg total) by mouth 2 (two) times daily as needed for spasms (abdominal cramping). 05/28/24  Yes Hilmar Moldovan, Selinda, MD  famotidine  (PEPCID ) 20 MG tablet Take 1 tablet (20 mg total) by mouth 2 (two) times daily for 15 days. 05/28/24 06/12/24 Yes Jeron Grahn, Selinda, MD  pantoprazole  (PROTONIX ) 20 MG tablet Take 1 tablet (20 mg total) by mouth daily. 05/28/24  Yes Taija Mathias, Selinda, MD  sucralfate  (CARAFATE ) 1 GM/10ML suspension Take 10 mLs (1 g total) by mouth 4 (four) times daily -  with meals and at bedtime. 05/28/24  Yes Jabin Tapp, Selinda, MD  albuterol  (PROVENTIL ) (2.5 MG/3ML) 0.083% nebulizer solution Take 3 mLs (2.5 mg total) by nebulization every 4 (four) hours as needed for wheezing or shortness of breath. 01/26/24   Ward, Harlene PEDLAR, PA-C  albuterol  (VENTOLIN  HFA) 108 (90 Base) MCG/ACT inhaler Inhale 2 puffs into the lungs every 6 (six)  hours as needed for wheezing or shortness of breath. 03/31/24   Hunsucker, Donnice SAUNDERS, MD  ASHWAGANDHA PO Take by mouth at bedtime as needed (for sleep).    [provider]  Budeson-Glycopyrrol-Formoterol  (BREZTRI  AEROSPHERE) 160-9-4.8 MCG/ACT AERO Inhale 2 puffs into the lungs in the morning and at bedtime. 12/17/23   Lorren Greig PARAS, NP  carvedilol  (COREG ) 12.5 MG tablet Take 1 tablet (12.5 mg total) by mouth 2 (two) times daily with a meal. 11/15/23   Sabharwal, Aditya, DO  dapagliflozin  propanediol (FARXIGA ) 10 MG TABS tablet Take 1 tablet (10 mg total) by mouth daily before breakfast. 03/31/24   Clegg, Amy D, NP  dexamethasone  (DECADRON ) 4 MG tablet Take 2 tabs by mouth 2 times daily starting day before chemo. Then take 2 tabs daily for 2 days starting day after chemo. Take with food. 05/15/24   Iruku, Praveena, MD  DULoxetine (CYMBALTA) 30 MG capsule Take 30 mg by mouth daily. 02/06/24   [provider]  fluticasone  (FLONASE ) 50 MCG/ACT nasal spray Place 2 sprays into both nostrils daily. 01/18/23   Lorren Greig PARAS, NP  furosemide  (LASIX ) 20 MG tablet TAKE ONE TABLET (20 MG) BY MOUTH DAILY AT 9AM 11/15/23   Sabharwal, Aditya, DO  hydrOXYzine  (VISTARIL ) 100 MG capsule Take 100 mg by mouth at bedtime. 01/02/23   [provider]  ibuprofen (ADVIL) 800 MG  tablet Take 800 mg by mouth every 6 (six) hours as needed for fever, headache or mild pain (pain score 1-3).    [provider]  montelukast  (SINGULAIR ) 10 MG tablet TAKE ONE TABLET (10 MG) BY MOUTH DAILY AT 9PM AT BEDTIME 04/15/24   Lorren Greig PARAS, NP  Multiple Vitamin (MULTIVITAMIN) tablet Take 1 tablet by mouth daily.    [provider]  ondansetron  (ZOFRAN ) 8 MG tablet Take 1 tablet (8 mg total) by mouth every 8 (eight) hours as needed for nausea or vomiting. Start on the third day after chemotherapy. 05/15/24   Iruku, Praveena, MD  oxyCODONE  (OXY IR/ROXICODONE ) 5 MG immediate release tablet Take 1 tablet (5  mg total) by mouth every 6 (six) hours as needed for severe pain (pain score 7-10). Patient not taking: Reported on 05/21/2024 04/08/24   Vanderbilt Ned, MD  prochlorperazine  (COMPAZINE ) 10 MG tablet Take 1 tablet (10 mg total) by mouth every 6 (six) hours as needed for nausea or vomiting. 05/15/24   Iruku, Praveena, MD  sacubitril -valsartan  (ENTRESTO ) 97-103 MG Take 1 tablet by mouth 2 (two) times daily. 04/28/24   Sabharwal, Aditya, DO  spironolactone  (ALDACTONE ) 25 MG tablet TAKE ONE TABLET (25 MG) BY MOUTH DAILY 03/14/24   Sabharwal, Aditya, DO  traZODone  (DESYREL ) 150 MG tablet Take 150 mg by mouth at bedtime. Patient not taking: Reported on 05/21/2024    [provider]    Allergies: Clarithromycin, Penicillins, and Penicillin g    Review of Systems  Gastrointestinal:  Positive for abdominal pain.    Updated Vital Signs BP 126/83   Pulse 85   Temp 98.6 F (37 C) (Oral)   Resp 20   Ht 5' 8 (1.727 m)   Wt 117 kg   SpO2 98%   BMI 39.22 kg/m   Physical Exam Vitals and nursing note reviewed.  Constitutional:      Appearance: She is well-developed.  HENT:     Head: Normocephalic and atraumatic.  Cardiovascular:     Rate and Rhythm: Normal rate and regular rhythm.  Pulmonary:     Effort: No respiratory distress.     Breath sounds: No stridor.  Abdominal:     General: There is no distension.     Tenderness: There is abdominal tenderness in the right upper quadrant, epigastric area and left upper quadrant. There is no guarding or rebound.  Musculoskeletal:     Cervical back: Normal range of motion.  Neurological:     Mental Status: She is alert.     (all labs ordered are listed, but only abnormal results are displayed) Labs Reviewed  COMPREHENSIVE METABOLIC PANEL WITH GFR - Abnormal; Notable for the following components:      Result Value   Glucose, Bld 108 (*)    Creatinine, Ser 1.12 (*)    Albumin 3.4 (*)    GFR, Estimated 58 (*)    All other components within  normal limits  CBC WITH DIFFERENTIAL/PLATELET - Abnormal; Notable for the following components:   WBC 3.3 (*)    nRBC 0.6 (*)    Neutro Abs 0.3 (*)    Abs Immature Granulocytes 0.28 (*)    All other components within normal limits  URINALYSIS, ROUTINE W REFLEX MICROSCOPIC - Abnormal; Notable for the following components:   Color, Urine AMBER (*)    APPearance HAZY (*)    Glucose, UA >=500 (*)    Protein, ur 30 (*)    Bacteria, UA RARE (*)  All other components within normal limits  LIPASE, BLOOD  PATHOLOGIST SMEAR REVIEW  I-STAT CG4 LACTIC ACID, ED  I-STAT CG4 LACTIC ACID, ED    EKG: None  Radiology: CT ABDOMEN PELVIS W CONTRAST Result Date: 05/27/2024 CLINICAL DATA:  Abdominal pain, acute, nonlocalized. Breast cancer, status post 1st chemotherapy infusion EXAM: CT ABDOMEN AND PELVIS WITH CONTRAST TECHNIQUE: Multidetector CT imaging of the abdomen and pelvis was performed using the standard protocol following bolus administration of intravenous contrast. RADIATION DOSE REDUCTION: This exam was performed according to the departmental dose-optimization program which includes automated exposure control, adjustment of the mA and/or kV according to patient size and/or use of iterative reconstruction technique. CONTRAST:  75mL OMNIPAQUE  IOHEXOL  350 MG/ML SOLN COMPARISON:  12/11/2023 FINDINGS: Lower chest: No acute findings Hepatobiliary: No focal hepatic abnormality. Gallbladder unremarkable. Pancreas: No focal abnormality or ductal dilatation. Spleen: No focal abnormality.  Normal size. Adrenals/Urinary Tract: No adrenal abnormality. No focal suspicious renal abnormality. No stones or hydronephrosis. Urinary bladder is unremarkable. Stomach/Bowel: Normal appendix. There is wall thickening in the distal stomach antral/pyloric region suggesting gastritis. Large and small bowel decompressed. No bowel obstruction. Vascular/Lymphatic: No evidence of aneurysm or adenopathy. Reproductive: Prior  hysterectomy.  No adnexal masses. Other: No free fluid or free air. Musculoskeletal: No acute bony abnormality. IMPRESSION: Mild wall thickening in the distal stomach suggesting gastritis. Electronically Signed   By: Franky Crease M.D.   On: 05/27/2024 22:40     Procedures   Medications Ordered in the ED  iohexol  (OMNIPAQUE ) 350 MG/ML injection 75 mL (75 mLs Intravenous Contrast Given 05/27/24 2227)  alum & mag hydroxide-simeth (MAALOX/MYLANTA) 200-200-20 MG/5ML suspension 30 mL (30 mLs Oral Given 05/28/24 0056)    And  lidocaine  (XYLOCAINE ) 2 % viscous mouth solution 15 mL (15 mLs Oral Given 05/28/24 0056)  pantoprazole  (PROTONIX ) injection 40 mg (40 mg Intravenous Given 05/28/24 0106)  famotidine  (PEPCID ) tablet 20 mg (20 mg Oral Given 05/28/24 0054)  dicyclomine  (BENTYL ) capsule 10 mg (10 mg Oral Given 05/28/24 0054)                                    Medical Decision Making Risk OTC drugs. Prescription drug management.   CT consistent with gastritis which makes sense with recently starting chemo and exacerbation by greasy foods.  Abdomen is tender in upper quadrants but no rebound, guarding or other evidence of peritonitis.  CT scan consistent with gastritis.  Labs reassuring given her history.  Had almost total resolution of her symptoms with Maalox, Pepcid  and Protonix  here.  Will DC on same.  She will follow-up with GI if not improving in 7 to 10 days.     Final diagnoses:  Acute gastritis without hemorrhage, unspecified gastritis type    ED Discharge Orders          Ordered    pantoprazole  (PROTONIX ) 20 MG tablet  Daily        05/28/24 0207    famotidine  (PEPCID ) 20 MG tablet  2 times daily        05/28/24 0207    sucralfate  (CARAFATE ) 1 GM/10ML suspension  3 times daily with meals & bedtime        05/28/24 0207    dicyclomine  (BENTYL ) 20 MG tablet  2 times daily PRN        05/28/24 0209  Higinio Grow, Selinda, MD 05/28/24 [id number]

## 2024-05-28 NOTE — Progress Notes (Signed)
 Patient Care Team: Lorren Greig PARAS, NP as PCP - General (Nurse Practitioner) Nahser, Aleene PARAS, MD as PCP - Cardiology (Cardiology) Nahser, Aleene PARAS, MD as Consulting Physician (Cardiology) Glean Stephane BROCKS, RN (Inactive) as Oncology Nurse Navigator Tyree Nanetta SAILOR, RN as Oncology Nurse Navigator Loretha Ash, MD as Consulting Physician (Hematology and Oncology)  Clinic Day:  05/28/2024  Referring physician: Lorren Greig PARAS, NP  I connected with [name] on 06/01/24 at  1:00 PM EDT by telephone and verified that I am speaking with the correct person using two identifiers.   I discussed the limitations, risks, security and privacy concerns of performing an evaluation and management service by telemedicine and the availability of in-person appointments. I also discussed with the patient that there may be a patient responsible charge related to this service. The patient expressed understanding and agreed to proceed.   Other persons participating in the visit and their role in the encounter: none   Patient's location: home   Provider's location: [facility]   Chief Complaint: toxicity check after first treatment with chemotherapy TC  ASSESSMENT & PLAN:   Assessment & Plan: Malignant neoplasm of upper-inner quadrant of left breast in female, estrogen receptor positive (HCC) Recent surgery removed five lymph nodes, two with small foci of cancer. Margins negative.  High oncotype score of 42 suggests significant chemotherapy benefit. Five-year recurrence risk is 25%. Chemotherapy recommended due to oncotype score and lymph node involvement. Cardiac concerns necessitate reconsideration of initial chemotherapy plan. Discussed chemotherapy side effects and emphasized blood count monitoring and growth factor support. Decision made to consider less intensive regimen due to cardiac dysfunction. - Discussed with Dr Bronson, ok to proceed with TC with close follow up visits  with cardiology. - Monitor for side effects and provide supportive care. - chemotherapy education was completed on 05/15/2024 - Postpone radiation until after chemotherapy. - 05/21/2024 - Cycle 1 Day 1 chemotherapy TC.  Udenyca  given on 05/24/2024.  -Should be scheduled for Cycle 2 day 1 on 06/13/2024.  - 05/27/2024 - patient seen in ED for abdominal pain and reduced appetite.  Diagnosed with acute gastritis.  Started on pantoprazole  daily, Pepcid  twice daily, and sucralfate  3 times daily with meals and at bedtime. Echocardiogram scheduled for 06/23/2024.   Gastritis Was seen in the ED yesterday, 05/27/2024.  She has been having abdominal pain and constipation.  Had been trying to eat but not tolerating anything very well.  Denies nausea and vomiting.  A CT scan of her abdomen pelvis was done during her ED visit.  Findings are consistent with gastritis.  She will start multiple medications to help alleviate symptoms.  She states she feels slightly improved since her ED visit.  Neutropenia WBC 3.3 and ANC 0.3.  She did receive Udenyca  injection on 05/24/2024.  Recheck labs in 2 weeks, prior to her next treatment in 2 weeks.  Plan Reviewed labs from 05/27/2024. - Neutropenia with WBC 3.3 and ANC of 0.3.  She did receive Udenyca  on 05/24/2024. - With unremarkable other than slightly reduced GFR at 58. Reviewed ED records from 05/27/2024. - Urinalysis did show some bacteria and evidence of dehydration. -lipase was norma.  - CT scan showing acute gastritis. Encouraged aggressive hydration with oral fluids as tolerated. Encouraged her to take  new medications from the ED as prescribed to help improve symptoms of gastritis.  Medications may improve negative side effects from subsequent chemotherapy treatments. Labs/flush,, and next treatment with chemotherapy TC as scheduled.  The  patient understands the plans discussed today and is in agreement with them.  She knows to contact our office if she develops concerns  prior to her next appointment.  I provided 10 minutes of face-to-face time during this encounter and > 50% was spent counseling as documented under my assessment and plan.    Powell FORBES Lessen, NP  [facility] - A DEPT OF [facility] [address] [place] [address] Dept: [phone] Dept Fax: [phone]   No orders of the defined types were placed in this encounter.     CHIEF COMPLAINT:  CC: Left breast cancer, estrogen receptor positive  Current Treatment: Chemotherapy TC every 21 days for 4 cycles  INTERVAL HISTORY:  Janet [name] is here today for repeat clinical assessment.  She was last seen by me on 05/21/2024.  She had initial chemotherapy cycle 1 day 1 TC.  She was seen in the ED yesterday, 05/27/2024, due to acute gastritis.  She presented with abdominal pain.  She had been experiencing constipation.  She tried MiraLAX which did not seem to help.  In the ED, her CMP was at baseline with mild elevation of creatinine at 1.12.  Her albumin was slightly low at 3.4.  Her CBC showed decreased WBC at 3.3, Hgb 13.8, HCT 42.8, platelets 186, ANC of 0.3.  She had a CT scan of the abdomen and pelvis which showed mild wall thickening in the distal stomach suggesting gastritis.  She states her symptoms have improved slightly since returning home very early this morning.  She denies chest pain, chest pressure, or shortness of breath. He denies headaches or visual disturbances. He denies nausea or vomiting.  She denies fevers or chills. Her appetite is decreased since initial chemotherapy treatment.   I have reviewed the past medical history, past surgical history, social history and family history with the patient and they are unchanged from previous note.  ALLERGIES:  is allergic to clarithromycin, penicillins, and penicillin g.  MEDICATIONS:  Current Outpatient Medications  Medication Sig Dispense Refill   albuterol   (PROVENTIL ) (2.5 MG/3ML) 0.083% nebulizer solution Take 3 mLs (2.5 mg total) by nebulization every 4 (four) hours as needed for wheezing or shortness of breath. 75 mL 2   albuterol  (VENTOLIN  HFA) 108 (90 Base) MCG/ACT inhaler Inhale 2 puffs into the lungs every 6 (six) hours as needed for wheezing or shortness of breath. 18 g 2   ASHWAGANDHA PO Take by mouth at bedtime as needed (for sleep).     Budeson-Glycopyrrol-Formoterol  (BREZTRI  AEROSPHERE) 160-9-4.8 MCG/ACT AERO Inhale 2 puffs into the lungs in the morning and at bedtime. 10.7 g 5   carvedilol  (COREG ) 12.5 MG tablet Take 1 tablet (12.5 mg total) by mouth 2 (two) times daily with a meal. 60 tablet 11   dapagliflozin  propanediol (FARXIGA ) 10 MG TABS tablet Take 1 tablet (10 mg total) by mouth daily before breakfast. 30 tablet 5   dexamethasone  (DECADRON ) 4 MG tablet Take 2 tabs by mouth 2 times daily starting day before chemo. Then take 2 tabs daily for 2 days starting day after chemo. Take with food. 30 tablet 1   dicyclomine  (BENTYL ) 20 MG tablet Take 1 tablet (20 mg total) by mouth 2 (two) times daily as needed for spasms (abdominal cramping). 20 tablet 0   DULoxetine (CYMBALTA) 30 MG capsule Take 30 mg by mouth daily.     famotidine  (PEPCID ) 20 MG tablet Take 1 tablet (20  mg total) by mouth 2 (two) times daily for 15 days. 10 tablet 0   fluticasone  (FLONASE ) 50 MCG/ACT nasal spray Place 2 sprays into both nostrils daily. 48 g 0   furosemide  (LASIX ) 20 MG tablet TAKE ONE TABLET (20 MG) BY MOUTH DAILY AT 9AM 30 tablet 11   hydrOXYzine  (VISTARIL ) 100 MG capsule Take 100 mg by mouth at bedtime.     ibuprofen (ADVIL) 800 MG tablet Take 800 mg by mouth every 6 (six) hours as needed for fever, headache or mild pain (pain score 1-3).     montelukast  (SINGULAIR ) 10 MG tablet TAKE ONE TABLET (10 MG) BY MOUTH DAILY AT 9PM AT BEDTIME 90 tablet 0   Multiple Vitamin (MULTIVITAMIN) tablet Take 1 tablet by mouth daily.     ondansetron  (ZOFRAN ) 8 MG tablet  Take 1 tablet (8 mg total) by mouth every 8 (eight) hours as needed for nausea or vomiting. Start on the third day after chemotherapy. 30 tablet 1   oxyCODONE  (OXY IR/ROXICODONE ) 5 MG immediate release tablet Take 1 tablet (5 mg total) by mouth every 6 (six) hours as needed for severe pain (pain score 7-10). (Patient not taking: Reported on 05/21/2024) 15 tablet 0   pantoprazole  (PROTONIX ) 20 MG tablet Take 1 tablet (20 mg total) by mouth daily. 30 tablet 0   prochlorperazine  (COMPAZINE ) 10 MG tablet Take 1 tablet (10 mg total) by mouth every 6 (six) hours as needed for nausea or vomiting. 30 tablet 1   sacubitril -valsartan  (ENTRESTO ) 97-103 MG Take 1 tablet by mouth 2 (two) times daily. 60 tablet 3   spironolactone  (ALDACTONE ) 25 MG tablet TAKE ONE TABLET (25 MG) BY MOUTH DAILY 90 tablet 0   sucralfate  (CARAFATE ) 1 GM/10ML suspension Take 10 mLs (1 g total) by mouth 4 (four) times daily -  with meals and at bedtime. 420 mL 0   traZODone  (DESYREL ) 150 MG tablet Take 150 mg by mouth at bedtime. (Patient not taking: Reported on 05/21/2024)     No current facility-administered medications for this visit.    HISTORY OF PRESENT ILLNESS:   Oncology History  Malignant neoplasm of upper-inner quadrant of left breast in female, estrogen receptor positive (HCC)  10/11/2023 Mammogram   There is a dominant solid mass at the palpable site of concern in the left breast at 6 o'clock measuring 4.2 cm. There are multiple other smaller solid-appearing masses in the left breast at 8 o'clock, 9 o'clock and 10 o'clock. No evidence of left axillary lymphadenopathy. No evidence of malignancy in the right breast.   03/11/2024 Initial Diagnosis   Malignant neoplasm of upper-inner quadrant of left breast in female, estrogen receptor positive (HCC)   03/11/2024 Cancer Staging   Staging form: Breast, AJCC 8th Edition - Pathologic: Stage IB (pT3, pN1(sn), cM0, G2, ER+, PR+, HER2-) - Signed by Loretha Ash, MD on  05/09/2024 Method of lymph node assessment: Sentinel lymph node biopsy Histologic grading system: 3 grade system   03/13/2024 Pathology Results   Left lumpectomy  Papillary carcinoma with invasion, 5.1 cm, grade 2  Ductal carcinoma in situ: Cribriform, nuclear grade 2  Margins, invasive: Positive      Closest, invasive: Posterior and medial  Margins, DCIS: Negative      Closest, DCIS: 1 mm, superior and anterior  Lymphovascular invasion: Not identified   The tumor cells are NEGATIVE for Her2 (1+).   Estrogen Receptor:       95%, POSTIVE, STRONG STAINING INTENSITY  Progesterone Receptor:   10%, POSITIVE,  MODERATE STAINING INTENSITY  Proliferation Marker Ki-67:   20%    05/21/2024 -  Chemotherapy   Patient is on Treatment Plan : BREAST TC q21d      Genetic Testing   [facility] CancerNext-Expanded Panel+RNA was Negative. Of note, a variant of uncertain significance was detected in the GATA2 gene (p.A342T). Report date is 05/20/2024.  The CancerNext-Expanded gene panel offered by [facility] and includes sequencing, rearrangement, and RNA analysis for the following 77 genes: AIP, ALK, APC, ATM, AXIN2, BAP1, BARD1, BMPR1A, BRCA1, BRCA2, BRIP1, CDC73, CDH1, CDK4, CDKN1B, CDKN2A, CEBPA, CHEK2, CTNNA1, DDX41, DICER1, ETV6, FH, FLCN, GATA2, LZTR1, MAX, MBD4, MEN1, MET, MLH1, MSH2, MSH3, MSH6, MUTYH, NF1, NF2, NTHL1, PALB2, PHOX2B, PMS2, POT1, PRKAR1A, PTCH1, PTEN, RAD51C, RAD51D, RB1, RET, RPS20, RUNX1, SDHA, SDHAF2, SDHB, SDHC, SDHD, SMAD4, SMARCA4, SMARCB1, SMARCE1, STK11, SUFU, TMEM127, TP53, TSC1, TSC2, VHL, and WT1 (sequencing and deletion/duplication); EGFR, HOXB13, KIT, MITF, PDGFRA, POLD1, and POLE (sequencing only); EPCAM and GREM1 (deletion/duplication only).         REVIEW OF SYSTEMS:   Constitutional: Denies fevers, chills or abnormal weight loss. Decreased appetite and fatigue.  Eyes: Denies blurriness of vision Ears, nose, mouth, throat, and face: Denies mucositis or sore  throat Respiratory: Denies cough, dyspnea or wheezes Cardiovascular: Denies palpitation, chest discomfort or lower extremity swelling Gastrointestinal:  she had been experiencing abdominal pain and constipation. ED visit last night. Had acute  gastritis.  Skin: Denies abnormal skin rashes Lymphatics: Denies new lymphadenopathy or easy bruising Neurological:Denies numbness, tingling or new weaknesses Behavioral/Psych: Mood is stable, no new changes  All other systems were reviewed with the patient and are negative.   VITALS:   Wt Readings from Last 3 Encounters:  05/27/24 257 lb 15 oz (117 kg)  05/21/24 259 lb 12.8 oz (117.8 kg)  05/09/24 260 lb 12.8 oz (118.3 kg)    There is no height or weight on file to calculate BMI.  Performance status (ECOG): 1 - Symptomatic but completely ambulatory   LABORATORY DATA:  I have reviewed the data as listed    Component Value Date/Time   NA 136 05/27/2024 2114   NA 143 11/20/2023 1135   K 4.2 05/27/2024 2114   CL 101 05/27/2024 2114   CO2 23 05/27/2024 2114   GLUCOSE 108 (H) 05/27/2024 2114   BUN 16 05/27/2024 2114   BUN 7 11/20/2023 1135   CREATININE 1.12 (H) 05/27/2024 2114   CREATININE 1.10 (H) 05/21/2024 0935   CALCIUM 9.1 05/27/2024 2114   PROT 6.5 05/27/2024 2114   PROT 6.0 01/22/2023 1017   ALBUMIN 3.4 (L) 05/27/2024 2114   ALBUMIN 3.8 01/22/2023 1017   AST 22 05/27/2024 2114   AST 15 05/21/2024 0935   ALT 25 05/27/2024 2114   ALT 13 05/21/2024 0935   ALKPHOS 62 05/27/2024 2114   BILITOT 0.7 05/27/2024 2114   BILITOT 0.4 05/21/2024 0935   GFRNONAA 58 (L) 05/27/2024 2114   GFRNONAA 59 (L) 05/21/2024 0935   Lab Results  Component Value Date   WBC 3.3 (L) 05/27/2024   NEUTROABS 0.3 (LL) 05/27/2024   HGB 13.8 05/27/2024   HCT 42.8 05/27/2024   MCV 83.8 05/27/2024   PLT 186 05/27/2024     RADIOGRAPHIC STUDIES: CT ABDOMEN PELVIS W CONTRAST Result Date: 05/27/2024 CLINICAL DATA:  Abdominal pain, acute, nonlocalized.  Breast cancer, status post 1st chemotherapy infusion EXAM: CT ABDOMEN AND PELVIS WITH CONTRAST TECHNIQUE: Multidetector CT imaging of the abdomen and pelvis was performed using the standard protocol  following bolus administration of intravenous contrast. RADIATION DOSE REDUCTION: This exam was performed according to the departmental dose-optimization program which includes automated exposure control, adjustment of the mA and/or kV according to patient size and/or use of iterative reconstruction technique. CONTRAST:  75mL OMNIPAQUE  IOHEXOL  350 MG/ML SOLN COMPARISON:  12/11/2023 FINDINGS: Lower chest: No acute findings Hepatobiliary: No focal hepatic abnormality. Gallbladder unremarkable. Pancreas: No focal abnormality or ductal dilatation. Spleen: No focal abnormality.  Normal size. Adrenals/Urinary Tract: No adrenal abnormality. No focal suspicious renal abnormality. No stones or hydronephrosis. Urinary bladder is unremarkable. Stomach/Bowel: Normal appendix. There is wall thickening in the distal stomach antral/pyloric region suggesting gastritis. Large and small bowel decompressed. No bowel obstruction. Vascular/Lymphatic: No evidence of aneurysm or adenopathy. Reproductive: Prior hysterectomy.  No adnexal masses. Other: No free fluid or free air. Musculoskeletal: No acute bony abnormality. IMPRESSION: Mild wall thickening in the distal stomach suggesting gastritis. Electronically Signed   By: Franky Crease M.D.   On: 05/27/2024 22:40

## 2024-05-30 ENCOUNTER — Ambulatory Visit: Payer: Self-pay | Admitting: Genetic Counselor

## 2024-05-30 NOTE — Progress Notes (Signed)
 HPI:   Janet [name] was previously seen in the [facility] due to a personal and family history of cancer and concerns regarding a hereditary predisposition to cancer. Please refer to our prior cancer genetics clinic note for more information regarding our discussion, assessment and recommendations, at the time. Janet [name]'s recent genetic test results were disclosed to her, as were recommendations warranted by these results. These results and recommendations are discussed in more detail below.  CANCER HISTORY:  Oncology History  Malignant neoplasm of upper-inner quadrant of left breast in female, estrogen receptor positive (HCC)  10/11/2023 Mammogram   There is a dominant solid mass at the palpable site of concern in the left breast at 6 o'clock measuring 4.2 cm. There are multiple other smaller solid-appearing masses in the left breast at 8 o'clock, 9 o'clock and 10 o'clock. No evidence of left axillary lymphadenopathy. No evidence of malignancy in the right breast.   03/11/2024 Initial Diagnosis   Malignant neoplasm of upper-inner quadrant of left breast in female, estrogen receptor positive (HCC)   03/11/2024 Cancer Staging   Staging form: Breast, AJCC 8th Edition - Pathologic: Stage IB (pT3, pN1(sn), cM0, G2, ER+, PR+, HER2-) - Signed by Loretha Ash, MD on 05/09/2024 Method of lymph node assessment: Sentinel lymph node biopsy Histologic grading system: 3 grade system   03/13/2024 Pathology Results   Left lumpectomy  Papillary carcinoma with invasion, 5.1 cm, grade 2  Ductal carcinoma in situ: Cribriform, nuclear grade 2  Margins, invasive: Positive      Closest, invasive: Posterior and medial  Margins, DCIS: Negative      Closest, DCIS: 1 mm, superior and anterior  Lymphovascular invasion: Not identified   The tumor cells are NEGATIVE for Her2 (1+).   Estrogen Receptor:       95%, POSTIVE, STRONG STAINING INTENSITY  Progesterone Receptor:   10%,  POSITIVE, MODERATE STAINING INTENSITY  Proliferation Marker Ki-67:   20%    05/21/2024 -  Chemotherapy   Patient is on Treatment Plan : BREAST TC q21d      Genetic Testing   [facility] CancerNext-Expanded Panel+RNA was Negative. Of note, a variant of uncertain significance was detected in the GATA2 gene (p.A342T). Report date is 05/20/2024.  The CancerNext-Expanded gene panel offered by [facility] and includes sequencing, rearrangement, and RNA analysis for the following 77 genes: AIP, ALK, APC, ATM, AXIN2, BAP1, BARD1, BMPR1A, BRCA1, BRCA2, BRIP1, CDC73, CDH1, CDK4, CDKN1B, CDKN2A, CEBPA, CHEK2, CTNNA1, DDX41, DICER1, ETV6, FH, FLCN, GATA2, LZTR1, MAX, MBD4, MEN1, MET, MLH1, MSH2, MSH3, MSH6, MUTYH, NF1, NF2, NTHL1, PALB2, PHOX2B, PMS2, POT1, PRKAR1A, PTCH1, PTEN, RAD51C, RAD51D, RB1, RET, RPS20, RUNX1, SDHA, SDHAF2, SDHB, SDHC, SDHD, SMAD4, SMARCA4, SMARCB1, SMARCE1, STK11, SUFU, TMEM127, TP53, TSC1, TSC2, VHL, and WT1 (sequencing and deletion/duplication); EGFR, HOXB13, KIT, MITF, PDGFRA, POLD1, and POLE (sequencing only); EPCAM and GREM1 (deletion/duplication only).       FAMILY HISTORY:  We obtained a detailed, 4-generation family history.  Significant diagnoses are listed below:      Family History  Problem Relation Age of Onset   Colon polyps Mother     Hypertension Mother     Stroke Mother     Deep vein thrombosis Mother     Congestive Heart Failure Mother     Arthritis Mother     Asthma Mother     Depression Mother     Other Father          history unknown   Obesity Sister  Diabetes Sister     Obesity Brother     Lung cancer Maternal Uncle          smoked   Congestive Heart Failure Maternal Grandmother     Throat cancer Maternal Grandfather          smoked   Colon cancer Neg Hx     Esophageal cancer Neg Hx     Rectal cancer Neg Hx     Stomach cancer Neg Hx               Janet [name] is unaware of previous family history of genetic testing for hereditary cancer  risks. There is no reported Ashkenazi Jewish ancestry.   GENETIC TEST RESULTS:  The [facility] CancerNext-Expanded Panel found no pathogenic mutations.  The CancerNext-Expanded gene panel offered by [facility] and includes sequencing, rearrangement, and RNA analysis for the following 77 genes: AIP, ALK, APC, ATM, AXIN2, BAP1, BARD1, BMPR1A, BRCA1, BRCA2, BRIP1, CDC73, CDH1, CDK4, CDKN1B, CDKN2A, CEBPA, CHEK2, CTNNA1, DDX41, DICER1, ETV6, FH, FLCN, GATA2, LZTR1, MAX, MBD4, MEN1, MET, MLH1, MSH2, MSH3, MSH6, MUTYH, NF1, NF2, NTHL1, PALB2, PHOX2B, PMS2, POT1, PRKAR1A, PTCH1, PTEN, RAD51C, RAD51D, RB1, RET, RPS20, RUNX1, SDHA, SDHAF2, SDHB, SDHC, SDHD, SMAD4, SMARCA4, SMARCB1, SMARCE1, STK11, SUFU, TMEM127, TP53, TSC1, TSC2, VHL, and WT1 (sequencing and deletion/duplication); EGFR, HOXB13, KIT, MITF, PDGFRA, POLD1, and POLE (sequencing only); EPCAM and GREM1 (deletion/duplication only).   The test report has been scanned into EPIC and is located under the Molecular Pathology section of the Results Review tab.  A portion of the result report is included below for reference. Genetic testing reported out on 05/20/2024.       Genetic testing identified a variant of uncertain significance (VUS) in the GATA2 gene called p.A342T.  At this time, it is unknown if this variant is associated with an increased risk for cancer or if it is benign, but most uncertain variants are reclassified to benign. It should not be used to make medical management decisions. With time, we suspect the laboratory will determine the significance of this variant, if any. If the laboratory reclassifies this variant, we will attempt to contact Janet [name] to discuss it further.   Even though a pathogenic variant was not identified, possible explanations for the cancer in the family may include: There may be no hereditary risk for cancer in the family. The cancers in Janet [name] and/or her family may be due to other genetic or  environmental factors. There may be a gene mutation in one of these genes that current testing methods cannot detect, but that chance is small. There could be another gene that has not yet been discovered, or that we have not yet tested, that is responsible for the cancer diagnoses in the family.   Therefore, it is important to remain in touch with cancer genetics in the future so that we can continue to offer Janet [name] the most up to date genetic testing.   ADDITIONAL GENETIC TESTING:  We discussed with Janet [name] that her genetic testing was fairly extensive.  If there are genes identified to increase cancer risk that can be analyzed in the future, we would be happy to discuss and coordinate this testing at that time.    CANCER SCREENING RECOMMENDATIONS:  Janet [name]'s test result is considered negative (normal).  This means that we have not identified a hereditary cause for her personal and family history of cancer at this time.   An individual's cancer risk and medical management  are not determined by genetic test results alone. Overall cancer risk assessment incorporates additional factors, including personal medical history, family history, and any available genetic information that may result in a personalized plan for cancer prevention and surveillance. Therefore, it is recommended she continue to follow the cancer management and screening guidelines provided by her oncology and primary healthcare provider.  RECOMMENDATIONS FOR FAMILY MEMBERS:   Since she did not inherit a mutation in a cancer predisposition gene included on this panel, her children could not have inherited a mutation from her in one of these genes. Individuals in this family might be at some increased risk of developing cancer, over the general population risk, due to the family history of cancer. We recommend women in this family have a yearly mammogram beginning at age 84, or 36 years younger than the earliest  onset of cancer, an annual clinical breast exam, and perform monthly breast self-exams.  We do not recommend familial testing for the GATA2 variant of uncertain significance (VUS).  FOLLOW-UP:  Cancer genetics is a rapidly advancing field and it is possible that new genetic tests will be appropriate for her and/or her family members in the future. We encouraged her to remain in contact with cancer genetics on an annual basis so we can update her personal and family histories and let her know of advances in cancer genetics that may benefit this family.   Our contact number was provided. Janet [name]'s questions were answered to her satisfaction, and she knows she is welcome to call us  at anytime with additional questions or concerns.   Hosam Mcfetridge, MS, [facility] Genetic Counselor [name] (P) [phone]

## 2024-06-01 ENCOUNTER — Encounter: Payer: Self-pay | Admitting: Nurse Practitioner

## 2024-06-01 ENCOUNTER — Encounter: Payer: Self-pay | Admitting: Hematology and Oncology

## 2024-06-11 DIAGNOSIS — K2970 Gastritis, unspecified, without bleeding: Secondary | ICD-10-CM | POA: Diagnosis not present

## 2024-06-11 DIAGNOSIS — J45909 Unspecified asthma, uncomplicated: Secondary | ICD-10-CM | POA: Diagnosis not present

## 2024-06-11 DIAGNOSIS — R7303 Prediabetes: Secondary | ICD-10-CM | POA: Diagnosis not present

## 2024-06-11 DIAGNOSIS — C50212 Malignant neoplasm of upper-inner quadrant of left female breast: Secondary | ICD-10-CM | POA: Diagnosis not present

## 2024-06-11 DIAGNOSIS — I890 Lymphedema, not elsewhere classified: Secondary | ICD-10-CM | POA: Diagnosis not present

## 2024-06-11 DIAGNOSIS — I10 Essential (primary) hypertension: Secondary | ICD-10-CM | POA: Diagnosis not present

## 2024-06-13 ENCOUNTER — Inpatient Hospital Stay: Admitting: Physician Assistant

## 2024-06-13 ENCOUNTER — Inpatient Hospital Stay

## 2024-06-13 VITALS — BP 100/77 | HR 83 | Temp 97.1°F | Resp 17 | Ht 68.0 in | Wt 273.0 lb

## 2024-06-13 DIAGNOSIS — Z5111 Encounter for antineoplastic chemotherapy: Secondary | ICD-10-CM

## 2024-06-13 DIAGNOSIS — Z8719 Personal history of other diseases of the digestive system: Secondary | ICD-10-CM

## 2024-06-13 DIAGNOSIS — Z79630 Long term (current) use of alkylating agent: Secondary | ICD-10-CM | POA: Diagnosis not present

## 2024-06-13 DIAGNOSIS — C50212 Malignant neoplasm of upper-inner quadrant of left female breast: Secondary | ICD-10-CM

## 2024-06-13 DIAGNOSIS — Z170 Estrogen receptor positive status [ER+]: Secondary | ICD-10-CM

## 2024-06-13 DIAGNOSIS — Z79633 Long term (current) use of mitotic inhibitor: Secondary | ICD-10-CM | POA: Diagnosis not present

## 2024-06-13 DIAGNOSIS — Z5189 Encounter for other specified aftercare: Secondary | ICD-10-CM | POA: Diagnosis not present

## 2024-06-13 LAB — CMP (CANCER CENTER ONLY)
ALT: 26 U/L (ref 0–44)
AST: 15 U/L (ref 15–41)
Albumin: 3.7 g/dL (ref 3.5–5.0)
Alkaline Phosphatase: 65 U/L (ref 38–126)
Anion gap: 7 (ref 5–15)
BUN: 17 mg/dL (ref 6–20)
CO2: 24 mmol/L (ref 22–32)
Calcium: 9.1 mg/dL (ref 8.9–10.3)
Chloride: 108 mmol/L (ref 98–111)
Creatinine: 0.91 mg/dL (ref 0.44–1.00)
GFR, Estimated: >60 mL/min (ref >60)
Glucose, Bld: 176 mg/dL — ABNORMAL HIGH (ref 70–99)
Potassium: 3.8 mmol/L (ref 3.5–5.1)
Sodium: 139 mmol/L (ref 135–145)
Total Bilirubin: 0.3 mg/dL (ref 0.0–1.2)
Total Protein: 6.5 g/dL (ref 6.5–8.1)

## 2024-06-13 LAB — CBC WITH DIFFERENTIAL (CANCER CENTER ONLY)
Abs Immature Granulocytes: 0.09 K/uL — ABNORMAL HIGH (ref 0.00–0.07)
Basophils Absolute: 0 K/uL (ref 0.0–0.1)
Basophils Relative: 0 %
Eosinophils Absolute: 0 K/uL (ref 0.0–0.5)
Eosinophils Relative: 0 %
HCT: 39.1 % (ref 36.0–46.0)
Hemoglobin: 12.5 g/dL (ref 12.0–15.0)
Immature Granulocytes: 1 %
Lymphocytes Relative: 12 %
Lymphs Abs: 1.3 K/uL (ref 0.7–4.0)
MCH: 26.7 pg (ref 26.0–34.0)
MCHC: 32 g/dL (ref 30.0–36.0)
MCV: 83.4 fL (ref 80.0–100.0)
Monocytes Absolute: 0.2 K/uL (ref 0.1–1.0)
Monocytes Relative: 2 %
Neutro Abs: 8.8 K/uL — ABNORMAL HIGH (ref 1.7–7.7)
Neutrophils Relative %: 85 %
Platelet Count: 452 K/uL — ABNORMAL HIGH (ref 150–400)
RBC: 4.69 MIL/uL (ref 3.87–5.11)
RDW: 16.1 % — ABNORMAL HIGH (ref 11.5–15.5)
WBC Count: 10.3 K/uL (ref 4.0–10.5)
nRBC: 0.3 % — ABNORMAL HIGH (ref 0.0–0.2)

## 2024-06-13 MED ORDER — PANTOPRAZOLE SODIUM 20 MG PO TBEC: 20.0000 mg | DELAYED_RELEASE_TABLET | Freq: Every day | ORAL | 0 refills | Status: DC

## 2024-06-13 MED ORDER — PALONOSETRON HCL INJECTION 0.25 MG/5ML
0.2500 mg | Freq: Once | INTRAVENOUS | Status: AC
  Administered 2024-06-13: 0.25 mg via INTRAVENOUS
  Filled 2024-06-13: qty 5

## 2024-06-13 MED ORDER — SODIUM CHLORIDE 0.9 % IV SOLN
75.0000 mg/m2 | Freq: Once | INTRAVENOUS | Status: AC
  Administered 2024-06-13: 179 mg via INTRAVENOUS
  Filled 2024-06-13: qty 17.9

## 2024-06-13 MED ORDER — SODIUM CHLORIDE 0.9 % IV SOLN
600.0000 mg/m2 | Freq: Once | INTRAVENOUS | Status: AC
  Administered 2024-06-13: 1500 mg via INTRAVENOUS
  Filled 2024-06-13: qty 75

## 2024-06-13 MED ORDER — DEXAMETHASONE SODIUM PHOSPHATE 10 MG/ML IJ SOLN
10.0000 mg | Freq: Once | INTRAMUSCULAR | Status: AC
  Administered 2024-06-13: 10 mg via INTRAVENOUS
  Filled 2024-06-13: qty 1

## 2024-06-13 MED ORDER — SODIUM CHLORIDE 0.9 % IV SOLN: INTRAVENOUS | Status: DC

## 2024-06-13 NOTE — Progress Notes (Signed)
 [facility] [phone]   [phone]  PROGRESS NOTE  Patient Care Team: Lorren Greig PARAS, NP as PCP - General (Nurse Practitioner) Nahser, Aleene PARAS, MD (Inactive) as PCP - Cardiology (Cardiology) Nahser, Aleene PARAS, MD (Inactive) as Consulting Physician (Cardiology) Glean Stephane BROCKS, RN (Inactive) as Oncology Nurse Navigator Tyree Nanetta SAILOR, RN as Oncology Nurse Navigator Loretha Ash, MD as Consulting Physician (Hematology and Oncology)   CHIEF COMPLAINTS/PURPOSE OF CONSULTATION:  Left breast cancer  Oncology History  Malignant neoplasm of upper-inner quadrant of left breast in female, estrogen receptor positive (HCC)  10/11/2023 Mammogram   There is a dominant solid mass at the palpable site of concern in the left breast at 6 o'clock measuring 4.2 cm. There are multiple other smaller solid-appearing masses in the left breast at 8 o'clock, 9 o'clock and 10 o'clock. No evidence of left axillary lymphadenopathy. No evidence of malignancy in the right breast.   03/11/2024 Initial Diagnosis   Malignant neoplasm of upper-inner quadrant of left breast in female, estrogen receptor positive (HCC)   03/11/2024 Cancer Staging   Staging form: Breast, AJCC 8th Edition - Pathologic: Stage IB (pT3, pN1(sn), cM0, G2, ER+, PR+, HER2-) - Signed by Loretha Ash, MD on 05/09/2024 Method of lymph node assessment: Sentinel lymph node biopsy Histologic grading system: 3 grade system   03/13/2024 Pathology Results   Left lumpectomy  Papillary carcinoma with invasion, 5.1 cm, grade 2  Ductal carcinoma in situ: Cribriform, nuclear grade 2  Margins, invasive: Positive      Closest, invasive: Posterior and medial  Margins, DCIS: Negative      Closest, DCIS: 1 mm, superior and anterior  Lymphovascular invasion: Not identified   The tumor cells are NEGATIVE for Her2 (1+).   Estrogen Receptor:       95%, POSTIVE, STRONG STAINING INTENSITY  Progesterone Receptor:    10%, POSITIVE, MODERATE STAINING INTENSITY  Proliferation Marker Ki-67:   20%    05/21/2024 -  Chemotherapy   Patient is on Treatment Plan : BREAST TC q21d      Genetic Testing   [facility] CancerNext-Expanded Panel+RNA was Negative. Of note, a variant of uncertain significance was detected in the GATA2 gene (p.A342T). Report date is 05/20/2024.  The CancerNext-Expanded gene panel offered by [employer] [facility] and includes sequencing, rearrangement, and RNA analysis for the following 77 genes: AIP, ALK, APC, ATM, AXIN2, BAP1, BARD1, BMPR1A, BRCA1, BRCA2, BRIP1, CDC73, CDH1, CDK4, CDKN1B, CDKN2A, CEBPA, CHEK2, CTNNA1, DDX41, DICER1, ETV6, FH, FLCN, GATA2, LZTR1, MAX, MBD4, MEN1, MET, MLH1, MSH2, MSH3, MSH6, MUTYH, NF1, NF2, NTHL1, PALB2, PHOX2B, PMS2, POT1, PRKAR1A, PTCH1, PTEN, RAD51C, RAD51D, RB1, RET, RPS20, RUNX1, SDHA, SDHAF2, SDHB, SDHC, SDHD, SMAD4, SMARCA4, SMARCB1, SMARCE1, STK11, SUFU, TMEM127, TP53, TSC1, TSC2, VHL, and WT1 (sequencing and deletion/duplication); EGFR, HOXB13, KIT, MITF, PDGFRA, POLD1, and POLE (sequencing only); EPCAM and GREM1 (deletion/duplication only).      CURRENT TREATMENT: Taxotere  and Cytoxan   INTERVAL HISTORY:  [name] 58 y.o. female Ms. Vaccarella returns for toxicity check prior to cycle 2-day 1 of TC chemotherapy.  She is unaccompanied for this visit.  Ms. Seibold reports her energy levels are fairly stable.  She is able to complete her daily activities on her own.  She experienced acute gastritis after her first treatment and was evaluated in the ER earlier this month on 05/27/2024.  She was given a GI cocktail which improved her symptoms.  She is continued on pantoprazole  daily without recurrent symptoms.  She denies nausea, vomiting  or bowel habit changes.  She denies easy bruising or signs of active bleeding.  She continues to have bilateral lower extremity edema.  She has noticed increased weight gain but is not sure if that is coming from increased p.o.  intake versus worsening lower extremity edema.  She denies fevers, chills, night sweats, shortness of breath, chest pain or cough.  She has no other complaints.  Rest of the 10 point ROS as below.  MEDICAL HISTORY:  Past Medical History:  Diagnosis Date   Allergy    Anxiety    Arthritis    Asthma    Breast cancer (HCC) 02/28/2024   CHF (congestive heart failure) (HCC)    Congestive heart failure (CHF) (HCC)    Depression    Elevated brain natriuretic peptide (BNP) level    Hypertension    Lymphedema    Nonischemic cardiomyopathy (HCC) 03/2021   Pre-diabetes    SOB (shortness of breath)    Substance abuse (HCC)     SURGICAL HISTORY: Past Surgical History:  Procedure Laterality Date   ABDOMINAL HYSTERECTOMY     AXILLARY SENTINEL NODE BIOPSY  04/08/2024   Procedure: BIOPSY, LYMPH NODE, SENTINEL, AXILLARY;  Surgeon: Vanderbilt Ned, MD;  Location: [facility] OR;  Service: General;;  LEFT SENTINEL LYMPH NODE MAPPING AND BIOPSY X 3   BREAST BIOPSY Right    BREAST BIOPSY Left 10/16/2023   times 3   BREAST BIOPSY Left 10/16/2023   US  LT BREAST BX W LOC DEV EA ADD LESION IMG BX SPEC US  GUIDE 10/16/2023 [facility]   BREAST BIOPSY Left 10/16/2023   US  LT BREAST BX W LOC DEV 1ST LESION IMG BX SPEC US  GUIDE 10/16/2023 [facility]   BREAST BIOPSY Left 10/16/2023   US  LT BREAST BX W LOC DEV EA ADD LESION IMG BX SPEC US  GUIDE 10/16/2023 [facility]   BREAST BIOPSY Left 02/27/2024   US  LT RADIOACTIVE SEED LOC 02/27/2024 [facility]   BREAST BIOPSY Left 02/27/2024   US  LT RADIOACTIVE SEED EA ADD LESION 02/27/2024 [facility]   BREAST LUMPECTOMY Left 04/08/2024   Procedure: BREAST LUMPECTOMY;  Surgeon: Vanderbilt Ned, MD;  Location: [facility];  Service: General;  Laterality: Left;  LEFT BREAST RE-EXCISION LUMPECTOMY, LEFT SENTINEL LYMPH NODE MAPPING   BREAST LUMPECTOMY WITH RADIOACTIVE SEED LOCALIZATION Left 02/28/2024   Procedure: LEFT BREAST SEED LUMPECTOMY;  Surgeon:  Vanderbilt Ned, MD;  Location: [facility] OR;  Service: General;  Laterality: Left;  3 seeds   CESAREAN SECTION     COLONOSCOPY WITH PROPOFOL  N/A 12/10/2023   Procedure: COLONOSCOPY WITH PROPOFOL ;  Surgeon: Leigh Elspeth SQUIBB, MD;  Location: [facility];  Service: Gastroenterology;  Laterality: N/A;   HEMOSTASIS CLIP PLACEMENT  12/10/2023   Procedure: HEMOSTASIS CLIP PLACEMENT;  Surgeon: Leigh Elspeth SQUIBB, MD;  Location: [facility];  Service: Gastroenterology;;   HERNIA REPAIR     umbilical as a child   POLYPECTOMY  12/10/2023   Procedure: POLYPECTOMY;  Surgeon: Leigh Elspeth SQUIBB, MD;  Location: [facility];  Service: Gastroenterology;;   RIGHT/LEFT HEART CATH AND CORONARY ANGIOGRAPHY N/A 04/01/2021   Procedure: RIGHT/LEFT HEART CATH AND CORONARY ANGIOGRAPHY;  Surgeon: Dann Candyce RAMAN, MD;  Location[facility];  Service: Cardiovascular;  Laterality: N/A;   TUBAL LIGATION      SOCIAL HISTORY: Social History   Socioeconomic History   Marital status: Single    Spouse name: Not on file   Number of children: 2   Years of education: 53  Highest education level: Associate degree: academic program  Occupational History   Occupation: Consulting civil engineer   Occupation: disabled  Tobacco Use   Smoking status: Never    Passive exposure: Current   Smokeless tobacco: Never  Vaping Use   Vaping status: Never Used  Substance and Sexual Activity   Alcohol use: Yes    Alcohol/week: 3.0 standard drinks of alcohol    Types: 3 Shots of liquor per week    Comment: 1 pint every other week   Drug use: Yes    Frequency: 1.0 times per week    Types: Marijuana, Cocaine    Comment: Pt has not sued cocaine in 6 months ( as of 04/07/24) twice a week   Sexual activity: Not Currently    Birth control/protection: Surgical  Other Topics Concern   Not on file  Social History Narrative   Not on file   Social Drivers of Health   Financial Resource Strain: Low Risk  (01/03/2024)   Overall Financial  Resource Strain (CARDIA)    Difficulty of Paying Living Expenses: Not very hard  Food Insecurity: Food Insecurity Present (03/13/2024)   Hunger Vital Sign    Worried About Running Out of Food in the Last Year: Never true    Ran Out of Food in the Last Year: Sometimes true  Transportation Needs: Unmet Transportation Needs (05/05/2024)   PRAPARE - Administrator, Civil Service (Medical): Yes    Lack of Transportation (Non-Medical): No  Physical Activity: Inactive (01/03/2024)   Exercise Vital Sign    Days of Exercise per Week: 0 days    Minutes of Exercise per Session: 0 min  Stress: Stress Concern Present (01/03/2024)   [employer] of Occupational Health - Occupational Stress Questionnaire    Feeling of Stress : Very much  Social Connections: Socially Isolated (01/03/2024)   Social Connection and Isolation Panel    Frequency of Communication with Friends and Family: More than three times a week    Frequency of Social Gatherings with Friends and Family: Once a week    Attends Religious Services: Never    Database administrator or Organizations: No    Attends Engineer, structural: Not on file    Marital Status: Never married  Intimate Partner Violence: Not At Risk (03/13/2024)   Humiliation, Afraid, Rape, and Kick questionnaire    Fear of Current or Ex-Partner: No    Emotionally Abused: No    Physically Abused: No    Sexually Abused: No    FAMILY HISTORY: Family History  Problem Relation Age of Onset   Colon polyps Mother    Hypertension Mother    Stroke Mother    Deep vein thrombosis Mother    Congestive Heart Failure Mother    Arthritis Mother    Asthma Mother    Depression Mother    Other Father        history unknown   Obesity Sister    Diabetes Sister    Obesity Brother    Lung cancer Maternal Uncle        smoked   Congestive Heart Failure Maternal Grandmother    Throat cancer Maternal Grandfather        smoked   Colon cancer Neg Hx     Esophageal cancer Neg Hx    Rectal cancer Neg Hx    Stomach cancer Neg Hx     ALLERGIES:  is allergic to clarithromycin, penicillins, and penicillin g.  MEDICATIONS:  Current Outpatient  Medications  Medication Sig Dispense Refill   albuterol  (PROVENTIL ) (2.5 MG/3ML) 0.083% nebulizer solution Take 3 mLs (2.5 mg total) by nebulization every 4 (four) hours as needed for wheezing or shortness of breath. 75 mL 2   albuterol  (VENTOLIN  HFA) 108 (90 Base) MCG/ACT inhaler Inhale 2 puffs into the lungs every 6 (six) hours as needed for wheezing or shortness of breath. 18 g 2   Budeson-Glycopyrrol-Formoterol  (BREZTRI  AEROSPHERE) 160-9-4.8 MCG/ACT AERO Inhale 2 puffs into the lungs in the morning and at bedtime. 10.7 g 5   carvedilol  (COREG ) 12.5 MG tablet Take 1 tablet (12.5 mg total) by mouth 2 (two) times daily with a meal. 60 tablet 11   dapagliflozin  propanediol (FARXIGA ) 10 MG TABS tablet Take 1 tablet (10 mg total) by mouth daily before breakfast. 30 tablet 5   dexamethasone  (DECADRON ) 4 MG tablet Take 2 tabs by mouth 2 times daily starting day before chemo. Then take 2 tabs daily for 2 days starting day after chemo. Take with food. 30 tablet 1   dicyclomine  (BENTYL ) 20 MG tablet Take 1 tablet (20 mg total) by mouth 2 (two) times daily as needed for spasms (abdominal cramping). 20 tablet 0   DULoxetine (CYMBALTA) 30 MG capsule Take 30 mg by mouth daily.     fluticasone  (FLONASE ) 50 MCG/ACT nasal spray Place 2 sprays into both nostrils daily. 48 g 0   furosemide  (LASIX ) 20 MG tablet TAKE ONE TABLET (20 MG) BY MOUTH DAILY AT 9AM 30 tablet 11   hydrOXYzine  (VISTARIL ) 100 MG capsule Take 100 mg by mouth at bedtime.     ibuprofen (ADVIL) 800 MG tablet Take 800 mg by mouth every 6 (six) hours as needed for fever, headache or mild pain (pain score 1-3).     montelukast  (SINGULAIR ) 10 MG tablet TAKE ONE TABLET (10 MG) BY MOUTH DAILY AT 9PM AT BEDTIME 90 tablet 0   Multiple Vitamin (MULTIVITAMIN) tablet  Take 1 tablet by mouth daily.     ondansetron  (ZOFRAN ) 8 MG tablet Take 1 tablet (8 mg total) by mouth every 8 (eight) hours as needed for nausea or vomiting. Start on the third day after chemotherapy. 30 tablet 1   prochlorperazine  (COMPAZINE ) 10 MG tablet Take 1 tablet (10 mg total) by mouth every 6 (six) hours as needed for nausea or vomiting. 30 tablet 1   sacubitril -valsartan  (ENTRESTO ) 97-103 MG Take 1 tablet by mouth 2 (two) times daily. 60 tablet 3   spironolactone  (ALDACTONE ) 25 MG tablet TAKE ONE TABLET (25 MG) BY MOUTH DAILY 90 tablet 0   sucralfate  (CARAFATE ) 1 GM/10ML suspension Take 10 mLs (1 g total) by mouth 4 (four) times daily -  with meals and at bedtime. 420 mL 0   traZODone  (DESYREL ) 150 MG tablet Take 150 mg by mouth at bedtime.     famotidine  (PEPCID ) 20 MG tablet Take 1 tablet (20 mg total) by mouth 2 (two) times daily for 15 days. (Patient not taking: Reported on 06/13/2024) 10 tablet 0   oxyCODONE  (OXY IR/ROXICODONE ) 5 MG immediate release tablet Take 1 tablet (5 mg total) by mouth every 6 (six) hours as needed for severe pain (pain score 7-10). (Patient not taking: Reported on 06/13/2024) 15 tablet 0   pantoprazole  (PROTONIX ) 20 MG tablet Take 1 tablet (20 mg total) by mouth daily. 30 tablet 0   No current facility-administered medications for this visit.   Facility-Administered Medications Ordered in Other Visits  Medication Dose Route Frequency Provider Last Rate Last Admin  0.9 %  sodium chloride  infusion   Intravenous Continuous Iruku, Praveena, MD 10 mL/hr at 06/13/24 1142 New Bag at 06/13/24 1142   cyclophosphamide  (CYTOXAN ) 1,500 mg in sodium chloride  0.9 % 250 mL chemo infusion  600 mg/m2 (Treatment Plan Recorded) Intravenous Once Iruku, Praveena, MD       DOCEtaxel  (TAXOTERE ) 179 mg in sodium chloride  0.9 % 250 mL chemo infusion  75 mg/m2 (Treatment Plan Recorded) Intravenous Once Iruku, Praveena, MD        REVIEW OF SYSTEMS:   Constitutional: ( - ) fevers, ( -  )  chills , ( - ) night sweats Eyes: ( - ) blurriness of vision, ( - ) double vision, ( - ) watery eyes Ears, nose, mouth, throat, and face: ( - ) mucositis, ( - ) sore throat Respiratory: ( - ) cough, ( - ) dyspnea, ( - ) wheezes Cardiovascular: ( - ) palpitation, ( - ) chest discomfort, (+) lower extremity swelling Gastrointestinal:  ( - ) nausea, ( + ) heartburn, ( - ) change in bowel habits Skin: ( - ) abnormal skin rashes Lymphatics: ( - ) new lymphadenopathy, ( - ) easy bruising Neurological: ( - ) numbness, ( - ) tingling, ( - ) new weaknesses Behavioral/Psych: ( - ) mood change, ( - ) new changes  All other systems were reviewed with the patient and are negative.  PHYSICAL EXAMINATION: ECOG PERFORMANCE STATUS: 1 - Symptomatic but completely ambulatory  Vitals:   06/13/24 1056  BP: 100/77  Pulse: 83  Resp: 17  Temp: (!) 97.1 F (36.2 C)  SpO2: 97%   Filed Weights   06/13/24 1056  Weight: 273 lb (123.8 kg)    GENERAL: well appearing fema;e in NAD  SKIN: skin color, texture, turgor are normal, no rashes or significant lesions EYES: conjunctiva are pink and non-injected, sclera clear OROPHARYNX: no exudate, no erythema; lips, buccal mucosa, and tongue normal   LUNGS: clear to auscultation and percussion with normal breathing effort HEART: regular rate & rhythm and no murmurs.  Bilateral lower extremity edema Musculoskeletal: no cyanosis of digits and no clubbing  PSYCH: alert & oriented x 3, fluent speech NEURO: no focal motor/sensory deficits  LABORATORY DATA:  I have reviewed the data as listed    Latest Ref Rng & Units 06/13/2024   10:43 AM 05/27/2024    9:14 PM 05/21/2024    9:35 AM  CBC  WBC 4.0 - 10.5 K/uL 10.3  3.3  8.1   Hemoglobin 12.0 - 15.0 g/dL 87.4  86.1  84.9   Hematocrit 36.0 - 46.0 % 39.1  42.8  45.4   Platelets 150 - 400 K/uL 452  186  274        Latest Ref Rng & Units 06/13/2024   10:43 AM 05/27/2024    9:14 PM 05/21/2024    9:35 AM  CMP  Glucose  70 - 99 mg/dL 823  891  868   BUN 6 - 20 mg/dL 17  16  16    Creatinine 0.44 - 1.00 mg/dL 9.08  8.87  8.89   Sodium 135 - 145 mmol/L 139  136  140   Potassium 3.5 - 5.1 mmol/L 3.8  4.2  4.3   Chloride 98 - 111 mmol/L 108  101  107   CO2 22 - 32 mmol/L 24  23  24    Calcium 8.9 - 10.3 mg/dL 9.1  9.1  89.9   Total Protein 6.5 - 8.1 g/dL 6.5  6.5  7.6   Total Bilirubin 0.0 - 1.2 mg/dL 0.3  0.7  0.4   Alkaline Phos 38 - 126 U/L 65  62  75   AST 15 - 41 U/L 15  22  15    ALT 0 - 44 U/L 26  25  13     RADIOGRAPHIC STUDIES: I have personally reviewed the radiological images as listed and agreed with the findings in the report. CT ABDOMEN PELVIS W CONTRAST Result Date: 05/27/2024 CLINICAL DATA:  Abdominal pain, acute, nonlocalized. Breast cancer, status post 1st chemotherapy infusion EXAM: CT ABDOMEN AND PELVIS WITH CONTRAST TECHNIQUE: Multidetector CT imaging of the abdomen and pelvis was performed using the standard protocol following bolus administration of intravenous contrast. RADIATION DOSE REDUCTION: This exam was performed according to the departmental dose-optimization program which includes automated exposure control, adjustment of the mA and/or kV according to patient size and/or use of iterative reconstruction technique. CONTRAST:  75mL OMNIPAQUE  IOHEXOL  350 MG/ML SOLN COMPARISON:  12/11/2023 FINDINGS: Lower chest: No acute findings Hepatobiliary: No focal hepatic abnormality. Gallbladder unremarkable. Pancreas: No focal abnormality or ductal dilatation. Spleen: No focal abnormality.  Normal size. Adrenals/Urinary Tract: No adrenal abnormality. No focal suspicious renal abnormality. No stones or hydronephrosis. Urinary bladder is unremarkable. Stomach/Bowel: Normal appendix. There is wall thickening in the distal stomach antral/pyloric region suggesting gastritis. Large and small bowel decompressed. No bowel obstruction. Vascular/Lymphatic: No evidence of aneurysm or adenopathy. Reproductive: Prior  hysterectomy.  No adnexal masses. Other: No free fluid or free air. Musculoskeletal: No acute bony abnormality. IMPRESSION: Mild wall thickening in the distal stomach suggesting gastritis. Electronically Signed   By: Franky Crease M.D.   On: 05/27/2024 22:40   PATHOLOGY:  A. BREAST, LEFT, LUMPECTOMY: Papillary carcinoma with invasion, 5.1 cm, grade 2 Ductal carcinoma in situ: Cribriform, nuclear grade 2 Margins, invasive: Positive     Closest, invasive: Posterior and medial Margins, DCIS: Negative     Closest, DCIS: 1 mm, superior and anterior Lymphovascular invasion: Not identified Prognostic markers:  ER pending, PR pending, Her2 pending, Ki-67 pending Other: Biopsy site and biopsy clip See oncology table Oncology table: INVASIVE CARCINOMA OF THE BREAST:  Resection Procedure: Left breast lumpectomy Specimen Laterality: Left breast Histologic Type: Papillary carcinoma with invasion Histologic Grade:      Glandular (Acinar)/Tubular Differentiation: 3      Nuclear Pleomorphism: 2      Mitotic Rate: 1      Overall Grade: 2 Tumor Size: 5.1 x 2.8 x 2 cm Ductal Carcinoma In Situ: Cribriform with intermediate nuclear grade Lymphatic and/or Vascular Invasion: Not identified Treatment Effect in the Breast: No known presurgical therapy Margins:      Distance from Closest Margin (mm): 0 mm      Specify Closest Margin (required only if <67mm): Posterior and medial DCIS Margins:      Distance from Closest Margin (mm): 1 mm      Specify Closest Margin (required only if <40mm): Superior and anterior Regional Lymph Nodes: No lymph nodes submitted] Distant Metastasis:      Distant Site(s) Involved: Not applicable    The tumor cells are NEGATIVE for Her2 (1+).   Estrogen Receptor:       95%, POSTIVE, STRONG STAINING INTENSITY  Progesterone Receptor:   10%, POSITIVE, MODERATE STAINING INTENSITY  Proliferation Marker Ki-67:   20%   ASSESSMENT & PLAN Janet [name] is a 58 y.o.  female who presents to the clinic for continued management of left breast cancer.  #Malignant  neoplasm of upper-inner quadrant of left breast in female, estrogen receptor positive: --Underwent left lumpectomy on 03/13/2024 with pathology review as above.  --High oncotype score of 42 suggests significant chemotherapy benefit. Five-year recurrence risk is 25%. Chemotherapy recommended due to oncotype score and lymph node involvement. Cardiac concerns necessitate reconsideration of initial chemotherapy plan. Decision made to consider less intensive regimen due to cardiac dysfunction.  --Started TC chemotherapy on 05/21/2024.  PLAN: --Due for cycle 2, day 1 today. --Labs from today were reviewed and adequate for treatment.  WBC 10.3, hemoglobin 12.5, platelets 252, ANC 8.8.  Creatinine and LFTs are normal. -- Proceed with treatment today without any dose modifications.  Due for GCSF injection on Monday. -- Return in 3 weeks with labs and follow-up prior to cycle 3, day 1.  #H/O gastritis: -- Symptoms are well-controlled with daily use of pantoprazole .  Refill sent today  #Heart failure: --Under the care of Dr. Gardenia. --Currently on Entresto , Coreg , Farxiga  and spironolactone  -- Last echo from 04/07/2024 showed EF of 30 to 35%. -- Next echo is scheduled for 06/23/2024  No orders of the defined types were placed in this encounter.   All questions were answered. The patient knows to call the clinic with any problems, questions or concerns.  I have spent a total of 30 minutes minutes of face-to-face and non-face-to-face time, preparing to see the patient, performing a medically appropriate examination, counseling and educating the patient, ordering medications/tests/procedures, rdocumenting clinical information in the electronic health record, independently interpreting results and communicating results to the patient, and care coordination.   Johnston Police, PA-C Department of  Hematology/Oncology [facility] Phone: [phone]

## 2024-06-16 ENCOUNTER — Inpatient Hospital Stay

## 2024-06-16 ENCOUNTER — Other Ambulatory Visit: Payer: Self-pay | Admitting: Cardiology

## 2024-06-16 VITALS — BP 103/75 | HR 75 | Temp 98.7°F | Resp 18 | Ht 68.0 in | Wt 273.0 lb

## 2024-06-16 DIAGNOSIS — Z5189 Encounter for other specified aftercare: Secondary | ICD-10-CM | POA: Diagnosis not present

## 2024-06-16 DIAGNOSIS — Z79630 Long term (current) use of alkylating agent: Secondary | ICD-10-CM | POA: Diagnosis not present

## 2024-06-16 DIAGNOSIS — I428 Other cardiomyopathies: Secondary | ICD-10-CM

## 2024-06-16 DIAGNOSIS — I5020 Unspecified systolic (congestive) heart failure: Secondary | ICD-10-CM

## 2024-06-16 DIAGNOSIS — Z5111 Encounter for antineoplastic chemotherapy: Secondary | ICD-10-CM | POA: Diagnosis not present

## 2024-06-16 DIAGNOSIS — C50212 Malignant neoplasm of upper-inner quadrant of left female breast: Secondary | ICD-10-CM | POA: Diagnosis not present

## 2024-06-16 DIAGNOSIS — Z170 Estrogen receptor positive status [ER+]: Secondary | ICD-10-CM

## 2024-06-16 DIAGNOSIS — Z79633 Long term (current) use of mitotic inhibitor: Secondary | ICD-10-CM | POA: Diagnosis not present

## 2024-06-16 MED ORDER — PEGFILGRASTIM-CBQV 6 MG/0.6ML ~~LOC~~ SOSY
6.0000 mg | PREFILLED_SYRINGE | Freq: Once | SUBCUTANEOUS | Status: AC
  Administered 2024-06-16: 6 mg via SUBCUTANEOUS
  Filled 2024-06-16: qty 0.6

## 2024-06-19 ENCOUNTER — Ambulatory Visit: Admitting: Hematology and Oncology

## 2024-06-20 ENCOUNTER — Other Ambulatory Visit: Payer: Self-pay | Admitting: Physician Assistant

## 2024-06-23 ENCOUNTER — Encounter (HOSPITAL_COMMUNITY): Admitting: Cardiology

## 2024-06-23 ENCOUNTER — Ambulatory Visit (HOSPITAL_COMMUNITY)

## 2024-07-04 ENCOUNTER — Inpatient Hospital Stay: Attending: Hematology and Oncology

## 2024-07-04 ENCOUNTER — Encounter: Payer: Self-pay | Admitting: *Deleted

## 2024-07-04 ENCOUNTER — Encounter: Payer: Self-pay | Admitting: Hematology and Oncology

## 2024-07-04 ENCOUNTER — Inpatient Hospital Stay (HOSPITAL_BASED_OUTPATIENT_CLINIC_OR_DEPARTMENT_OTHER): Admitting: Hematology and Oncology

## 2024-07-04 ENCOUNTER — Inpatient Hospital Stay

## 2024-07-04 VITALS — BP 100/75 | HR 76 | Temp 97.8°F | Resp 18 | Ht 68.0 in | Wt 269.1 lb

## 2024-07-04 DIAGNOSIS — Z79633 Long term (current) use of mitotic inhibitor: Secondary | ICD-10-CM | POA: Insufficient documentation

## 2024-07-04 DIAGNOSIS — L304 Erythema intertrigo: Secondary | ICD-10-CM | POA: Diagnosis not present

## 2024-07-04 DIAGNOSIS — C50212 Malignant neoplasm of upper-inner quadrant of left female breast: Secondary | ICD-10-CM

## 2024-07-04 DIAGNOSIS — Z5189 Encounter for other specified aftercare: Secondary | ICD-10-CM | POA: Diagnosis not present

## 2024-07-04 DIAGNOSIS — Z5111 Encounter for antineoplastic chemotherapy: Secondary | ICD-10-CM | POA: Diagnosis not present

## 2024-07-04 DIAGNOSIS — Z79630 Long term (current) use of alkylating agent: Secondary | ICD-10-CM | POA: Insufficient documentation

## 2024-07-04 DIAGNOSIS — Z170 Estrogen receptor positive status [ER+]: Secondary | ICD-10-CM | POA: Diagnosis not present

## 2024-07-04 DIAGNOSIS — M898X9 Other specified disorders of bone, unspecified site: Secondary | ICD-10-CM | POA: Diagnosis not present

## 2024-07-04 LAB — CMP (CANCER CENTER ONLY)
ALT: 19 U/L (ref 0–44)
AST: 16 U/L (ref 15–41)
Albumin: 3.9 g/dL (ref 3.5–5.0)
Alkaline Phosphatase: 60 U/L (ref 38–126)
Anion gap: 7 (ref 5–15)
BUN: 17 mg/dL (ref 6–20)
CO2: 25 mmol/L (ref 22–32)
Calcium: 9.2 mg/dL (ref 8.9–10.3)
Chloride: 109 mmol/L (ref 98–111)
Creatinine: 0.95 mg/dL (ref 0.44–1.00)
GFR, Estimated: >60 mL/min (ref >60)
Glucose, Bld: 126 mg/dL — ABNORMAL HIGH (ref 70–99)
Potassium: 4.3 mmol/L (ref 3.5–5.1)
Sodium: 141 mmol/L (ref 135–145)
Total Bilirubin: 0.3 mg/dL (ref 0.0–1.2)
Total Protein: 6.3 g/dL — ABNORMAL LOW (ref 6.5–8.1)

## 2024-07-04 LAB — CBC WITH DIFFERENTIAL (CANCER CENTER ONLY)
Abs Immature Granulocytes: 0.05 K/uL (ref 0.00–0.07)
Basophils Absolute: 0 K/uL (ref 0.0–0.1)
Basophils Relative: 0 %
Eosinophils Absolute: 0 K/uL (ref 0.0–0.5)
Eosinophils Relative: 0 %
HCT: 37.1 % (ref 36.0–46.0)
Hemoglobin: 12 g/dL (ref 12.0–15.0)
Immature Granulocytes: 1 %
Lymphocytes Relative: 12 %
Lymphs Abs: 1 K/uL (ref 0.7–4.0)
MCH: 26.9 pg (ref 26.0–34.0)
MCHC: 32.3 g/dL (ref 30.0–36.0)
MCV: 83.2 fL (ref 80.0–100.0)
Monocytes Absolute: 0.2 K/uL (ref 0.1–1.0)
Monocytes Relative: 2 %
Neutro Abs: 7.2 K/uL (ref 1.7–7.7)
Neutrophils Relative %: 85 %
Platelet Count: 287 K/uL (ref 150–400)
RBC: 4.46 MIL/uL (ref 3.87–5.11)
RDW: 17.4 % — ABNORMAL HIGH (ref 11.5–15.5)
WBC Count: 8.5 K/uL (ref 4.0–10.5)
nRBC: 0.2 % (ref 0.0–0.2)

## 2024-07-04 MED ORDER — NYSTATIN 100000 UNIT/GM EX POWD: 1.0000 | Freq: Three times a day (TID) | CUTANEOUS | 0 refills | Status: DC

## 2024-07-04 MED ORDER — SODIUM CHLORIDE 0.9 % IV SOLN: INTRAVENOUS | Status: DC

## 2024-07-04 MED ORDER — OXYCODONE HCL 5 MG PO TABS: 5.0000 mg | ORAL_TABLET | Freq: Four times a day (QID) | ORAL | 0 refills | Status: DC | PRN

## 2024-07-04 MED ORDER — SODIUM CHLORIDE 0.9 % IV SOLN
600.0000 mg/m2 | Freq: Once | INTRAVENOUS | Status: AC
  Administered 2024-07-04: 1500 mg via INTRAVENOUS
  Filled 2024-07-04: qty 75

## 2024-07-04 MED ORDER — PALONOSETRON HCL INJECTION 0.25 MG/5ML
0.2500 mg | Freq: Once | INTRAVENOUS | Status: AC
  Administered 2024-07-04: 0.25 mg via INTRAVENOUS
  Filled 2024-07-04: qty 5

## 2024-07-04 MED ORDER — DEXAMETHASONE SODIUM PHOSPHATE 10 MG/ML IJ SOLN
10.0000 mg | Freq: Once | INTRAMUSCULAR | Status: AC
  Administered 2024-07-04: 10 mg via INTRAVENOUS
  Filled 2024-07-04: qty 1

## 2024-07-04 MED ORDER — SODIUM CHLORIDE 0.9 % IV SOLN
75.0000 mg/m2 | Freq: Once | INTRAVENOUS | Status: AC
  Administered 2024-07-04: 179 mg via INTRAVENOUS
  Filled 2024-07-04: qty 17.9

## 2024-07-04 NOTE — Patient Instructions (Signed)
 [facility] - A DEPT OF [facility]  Discharge Instructions: Thank you for choosing [facility] to provide your oncology and hematology care.   If you have a lab appointment with the Cancer Center, please go directly to the Cancer Center and check in at the registration area.   Wear comfortable clothing and clothing appropriate for easy access to any Portacath or PICC line.   We strive to give you quality time with your provider. You may need to reschedule your appointment if you arrive late (15 or more minutes).  Arriving late affects you and other patients whose appointments are after yours.  Also, if you miss three or more appointments without notifying the office, you may be dismissed from the clinic at the provider's discretion.      For prescription refill requests, have your pharmacy contact our office and allow 72 hours for refills to be completed.    Today you received the following chemotherapy and/or immunotherapy agents taxotere and cytoxan      To help prevent nausea and vomiting after your treatment, we encourage you to take your nausea medication as directed.  BELOW ARE SYMPTOMS THAT SHOULD BE REPORTED IMMEDIATELY: *FEVER GREATER THAN 100.4 F (38 C) OR HIGHER *CHILLS OR SWEATING *NAUSEA AND VOMITING THAT IS NOT CONTROLLED WITH YOUR NAUSEA MEDICATION *UNUSUAL SHORTNESS OF BREATH *UNUSUAL BRUISING OR BLEEDING *URINARY PROBLEMS (pain or burning when urinating, or frequent urination) *BOWEL PROBLEMS (unusual diarrhea, constipation, pain near the anus) TENDERNESS IN MOUTH AND THROAT WITH OR WITHOUT PRESENCE OF ULCERS (sore throat, sores in mouth, or a toothache) UNUSUAL RASH, SWELLING OR PAIN  UNUSUAL VAGINAL DISCHARGE OR ITCHING   Items with * indicate a potential emergency and should be followed up as soon as possible or go to the Emergency Department if any problems should occur.  Please show the CHEMOTHERAPY ALERT CARD or  IMMUNOTHERAPY ALERT CARD at check-in to the Emergency Department and triage nurse.  Should you have questions after your visit or need to cancel or reschedule your appointment, please contact [facility] - A DEPT OF [facility]  Dept: [phone]  and follow the prompts.  Office hours are 8:00 a.m. to 4:30 p.m. Monday - Friday. Please note that voicemails left after 4:00 p.m. may not be returned until the following business day.  We are closed weekends and major holidays. You have access to a nurse at all times for urgent questions. Please call the main number to the clinic Dept: [phone] and follow the prompts.   For any non-urgent questions, you may also contact your provider using MyChart. We now offer e-Visits for anyone 35 and older to request care online for non-urgent symptoms. For details visit [email or web address].   Also download the MyChart app! Go to the app store, search MyChart, open the app, select [place], and log in with your MyChart username and password.

## 2024-07-04 NOTE — Progress Notes (Signed)
 [facility] [phone]   [phone]  PROGRESS NOTE  Patient Care Team: Lorren Greig PARAS, NP as PCP - General (Nurse Practitioner) Nahser, Aleene PARAS, MD (Inactive) as PCP - Cardiology (Cardiology) Nahser, Aleene PARAS, MD (Inactive) as Consulting Physician (Cardiology) Glean Stephane BROCKS, RN (Inactive) as Oncology Nurse Navigator Tyree Nanetta SAILOR, RN as Oncology Nurse Navigator Loretha Ash, MD as Consulting Physician (Hematology and Oncology)   CHIEF COMPLAINTS/PURPOSE OF CONSULTATION:  Left breast cancer  Oncology History  Malignant neoplasm of upper-inner quadrant of left breast in female, estrogen receptor positive (HCC)  10/11/2023 Mammogram   There is a dominant solid mass at the palpable site of concern in the left breast at 6 o'clock measuring 4.2 cm. There are multiple other smaller solid-appearing masses in the left breast at 8 o'clock, 9 o'clock and 10 o'clock. No evidence of left axillary lymphadenopathy. No evidence of malignancy in the right breast.   03/11/2024 Initial Diagnosis   Malignant neoplasm of upper-inner quadrant of left breast in female, estrogen receptor positive (HCC)   03/11/2024 Cancer Staging   Staging form: Breast, AJCC 8th Edition - Pathologic: Stage IB (pT3, pN1(sn), cM0, G2, ER+, PR+, HER2-) - Signed by Loretha Ash, MD on 05/09/2024 Method of lymph node assessment: Sentinel lymph node biopsy Histologic grading system: 3 grade system   03/13/2024 Pathology Results   Left lumpectomy  Papillary carcinoma with invasion, 5.1 cm, grade 2  Ductal carcinoma in situ: Cribriform, nuclear grade 2  Margins, invasive: Positive      Closest, invasive: Posterior and medial  Margins, DCIS: Negative      Closest, DCIS: 1 mm, superior and anterior  Lymphovascular invasion: Not identified   The tumor cells are NEGATIVE for Her2 (1+).   Estrogen Receptor:       95%, POSTIVE, STRONG STAINING INTENSITY  Progesterone Receptor:    10%, POSITIVE, MODERATE STAINING INTENSITY  Proliferation Marker Ki-67:   20%    05/21/2024 -  Chemotherapy   Patient is on Treatment Plan : BREAST TC q21d      Genetic Testing   [facility] CancerNext-Expanded Panel+RNA was Negative. Of note, a variant of uncertain significance was detected in the GATA2 gene (p.A342T). Report date is 05/20/2024.  The CancerNext-Expanded gene panel offered by [facility] and includes sequencing, rearrangement, and RNA analysis for the following 77 genes: AIP, ALK, APC, ATM, AXIN2, BAP1, BARD1, BMPR1A, BRCA1, BRCA2, BRIP1, CDC73, CDH1, CDK4, CDKN1B, CDKN2A, CEBPA, CHEK2, CTNNA1, DDX41, DICER1, ETV6, FH, FLCN, GATA2, LZTR1, MAX, MBD4, MEN1, MET, MLH1, MSH2, MSH3, MSH6, MUTYH, NF1, NF2, NTHL1, PALB2, PHOX2B, PMS2, POT1, PRKAR1A, PTCH1, PTEN, RAD51C, RAD51D, RB1, RET, RPS20, RUNX1, SDHA, SDHAF2, SDHB, SDHC, SDHD, SMAD4, SMARCA4, SMARCB1, SMARCE1, STK11, SUFU, TMEM127, TP53, TSC1, TSC2, VHL, and WT1 (sequencing and deletion/duplication); EGFR, HOXB13, KIT, MITF, PDGFRA, POLD1, and POLE (sequencing only); EPCAM and GREM1 (deletion/duplication only).      CURRENT TREATMENT: Taxotere  and Cytoxan   INTERVAL HISTORY:  [name] 58 y.o. female Ms. Linebaugh returns for toxicity check prior to cycle 3-day 1 of TC chemotherapy.   Discussed the use of AI scribe software for clinical note transcription with the patient, who gave verbal consent to proceed.  History of Present Illness CHANDLAR STAEBELL is a 58 year old female undergoing chemotherapy who presents with bone pain and skin rash.  She experiences significant bone pain following her chemotherapy shot, which persists for seven days. Ibuprofen has been ineffective in providing relief. Oxycodone , left over from a previous surgery,  has been effective, but she only has one pill remaining.  She describes a mild skin rash that improves with cortisone application but recurs within thirty minutes, necessitating  frequent reapplication. The rash is located on her body, including her thigh. She also notes an itchy scalp and has been using baby shampoo to alleviate the symptoms.  No nausea, vomiting, diarrhea, or mouth sores. Occasional tingling in her hand resolves with exercises learned in rehab. She has noticed changes in her fingernails, which she attributes to chemotherapy.  She mentions living in a humid environment with a potential mold problem, which she suspects may contribute to her skin issues.  Rest of the pertinent 10 ROS reviewed and neg  MEDICAL HISTORY:  Past Medical History:  Diagnosis Date   Allergy    Anxiety    Arthritis    Asthma    Breast cancer (HCC) 02/28/2024   CHF (congestive heart failure) (HCC)    Congestive heart failure (CHF) (HCC)    Depression    Elevated brain natriuretic peptide (BNP) level    Hypertension    Lymphedema    Nonischemic cardiomyopathy (HCC) 03/2021   Pre-diabetes    SOB (shortness of breath)    Substance abuse (HCC)     SURGICAL HISTORY: Past Surgical History:  Procedure Laterality Date   ABDOMINAL HYSTERECTOMY     AXILLARY SENTINEL NODE BIOPSY  04/08/2024   Procedure: BIOPSY, LYMPH NODE, SENTINEL, AXILLARY;  Surgeon: Vanderbilt Ned, MD;  Location: [facility];  Service: General;;  LEFT SENTINEL LYMPH NODE MAPPING AND BIOPSY X 3   BREAST BIOPSY Right    BREAST BIOPSY Left 10/16/2023   times 3   BREAST BIOPSY Left 10/16/2023   US  LT BREAST BX W LOC DEV EA ADD LESION IMG BX SPEC US  GUIDE 10/16/2023 [facility]   BREAST BIOPSY Left 10/16/2023   US  LT BREAST BX W LOC DEV 1ST LESION IMG BX SPEC US  GUIDE 10/16/2023 [facility]   BREAST BIOPSY Left 10/16/2023   US  LT BREAST BX W LOC DEV EA ADD LESION IMG BX SPEC US  GUIDE 10/16/2023 [facility]   BREAST BIOPSY Left 02/27/2024   US  LT RADIOACTIVE SEED LOC 02/27/2024 [facility]MO[facility]HY   BREAST BIOPSY Left 02/27/2024   US  LT RADIOACTIVE SEED EA ADD LESION 02/27/2024 [facility] MAMMO[facility]HY    BREAST LUMPECTOMY Left 04/08/2024   Procedure: BREAST LUMPECTOMY;  Surgeon: Vanderbilt Ned, MD;  Location: [facility];  Service: General;  Laterality: Left;  LEFT BREAST RE-EXCISION LUMPECTOMY, LEFT SENTINEL LYMPH NODE MAPPING   BREAST LUMPECTOMY WITH RADIOACTIVE SEED LOCALIZATION Left 02/28/2024   Procedure: LEFT BREAST SEED LUMPECTOMY;  Surgeon: Vanderbilt Ned, MD;  Location: [facility];  Service: General;  Laterality: Left;  3 seeds   CESAREAN SECTION     COLONOSCOPY WITH PROPOFOL N/A 12/10/2023   Procedure: COLONOSCOPY WITH PROPOFOL;  Surgeon: Leigh Elspeth SQUIBB, MD;  [facility];  Service: Gastroenterology;  Laterality: N/A;   HEMOSTASIS CLIP PLACEMENT  12/10/2023   Procedure: HEMOSTASIS CLIP PLACEMENT;  Surgeon: Leigh Elspeth SQUIBB, MD;  [facility];  Service: Gastroenterology;;   HERNIA REPAIR     umbilical as a child   POLYPECTOMY  12/10/2023   Procedure: POLYPECTOMY;  Surgeon: Leigh Elspeth SQUIBB, MD;  [facility];  Service: Gastroenterology;;   RIGHT/LEFT HEART CATH AND CORONARY ANGIOGRAPHY N/A 04/01/2021   Procedure: RIGHT/LEFT HEART CATH AND CORONARY ANGIOGRAPHY;  Surgeon: Dann Candyce RAMAN, MD;  Location: [facility] INVASIVE CV LAB;  Service: Cardiovascular;  Laterality: N/A;  TUBAL LIGATION      SOCIAL HISTORY: Social History   Socioeconomic History   Marital status: Single    Spouse name: Not on file   Number of children: 2   Years of education: 14   Highest education level: Associate degree: academic program  Occupational History   Occupation: Consulting civil engineer   Occupation: disabled  Tobacco Use   Smoking status: Never    Passive exposure: Current   Smokeless tobacco: Never  Vaping Use   Vaping status: Never Used  Substance and Sexual Activity   Alcohol use: Yes    Alcohol/week: 3.0 standard drinks of alcohol    Types: 3 Shots of liquor per week    Comment: 1 pint every other week   Drug use: Yes    Frequency: 1.0 times per week    Types:  Marijuana, Cocaine    Comment: Pt has not sued cocaine in 6 months ( as of 04/07/24) twice a week   Sexual activity: Not Currently    Birth control/protection: Surgical  Other Topics Concern   Not on file  Social History Narrative   Not on file   Social Drivers of Health   Financial Resource Strain: Low Risk  (01/03/2024)   Overall Financial Resource Strain (CARDIA)    Difficulty of Paying Living Expenses: Not very hard  Food Insecurity: Food Insecurity Present (03/13/2024)   Hunger Vital Sign    Worried About Running Out of Food in the Last Year: Never true    Ran Out of Food in the Last Year: Sometimes true  Transportation Needs: Unmet Transportation Needs (05/05/2024)   PRAPARE - Administrator, Civil Service (Medical): Yes    Lack of Transportation (Non-Medical): No  Physical Activity: Inactive (01/03/2024)   Exercise Vital Sign    Days of Exercise per Week: 0 days    Minutes of Exercise per Session: 0 min  Stress: Stress Concern Present (01/03/2024)   [employer] of Occupational Health - Occupational Stress Questionnaire    Feeling of Stress : Very much  Social Connections: Socially Isolated (01/03/2024)   Social Connection and Isolation Panel    Frequency of Communication with Friends and Family: More than three times a week    Frequency of Social Gatherings with Friends and Family: Once a week    Attends Religious Services: Never    Database administrator or Organizations: No    Attends Engineer, structural: Not on file    Marital Status: Never married  Intimate Partner Violence: Not At Risk (03/13/2024)   Humiliation, Afraid, Rape, and Kick questionnaire    Fear of Current or Ex-Partner: No    Emotionally Abused: No    Physically Abused: No    Sexually Abused: No    FAMILY HISTORY: Family History  Problem Relation Age of Onset   Colon polyps Mother    Hypertension Mother    Stroke Mother    Deep vein thrombosis Mother    Congestive Heart  Failure Mother    Arthritis Mother    Asthma Mother    Depression Mother    Other Father        history unknown   Obesity Sister    Diabetes Sister    Obesity Brother    Lung cancer Maternal Uncle        smoked   Congestive Heart Failure Maternal Grandmother    Throat cancer Maternal Grandfather        smoked   Colon cancer  Neg Hx    Esophageal cancer Neg Hx    Rectal cancer Neg Hx    Stomach cancer Neg Hx     ALLERGIES:  is allergic to clarithromycin, penicillins, and penicillin g.  MEDICATIONS:  Current Outpatient Medications  Medication Sig Dispense Refill   albuterol (PROVENTIL) (2.5 MG/3ML) 0.083% nebulizer solution Take 3 mLs (2.5 mg total) by nebulization every 4 (four) hours as needed for wheezing or shortness of breath. 75 mL 2   albuterol (VENTOLIN HFA) 108 (90 Base) MCG/ACT inhaler Inhale 2 puffs into the lungs every 6 (six) hours as needed for wheezing or shortness of breath. 18 g 2   Budeson-Glycopyrrol-Formoterol (BREZTRI AEROSPHERE) 160-9-4.8 MCG/ACT AERO Inhale 2 puffs into the lungs in the morning and at bedtime. 10.7 g 5   carvedilol (COREG) 12.5 MG tablet Take 1 tablet (12.5 mg total) by mouth 2 (two) times daily with a meal. 60 tablet 11   dapagliflozin propanediol (FARXIGA) 10 MG TABS tablet Take 1 tablet (10 mg total) by mouth daily before breakfast. 30 tablet 5   dexamethasone (DECADRON) 4 MG tablet Take 2 tabs by mouth 2 times daily starting day before chemo. Then take 2 tabs daily for 2 days starting day after chemo. Take with food. 30 tablet 1   dicyclomine (BENTYL) 20 MG tablet Take 1 tablet (20 mg total) by mouth 2 (two) times daily as needed for spasms (abdominal cramping). 20 tablet 0   DULoxetine (CYMBALTA) 30 MG capsule Take 30 mg by mouth daily.     famotidine (PEPCID) 20 MG tablet Take 1 tablet (20 mg total) by mouth 2 (two) times daily for 15 days. 10 tablet 0   fluticasone (FLONASE) 50 MCG/ACT nasal spray Place 2 sprays into both nostrils daily.  48 g 0   furosemide (LASIX) 20 MG tablet TAKE ONE TABLET (20 MG) BY MOUTH DAILY AT 9AM 30 tablet 11   ibuprofen (ADVIL) 800 MG tablet Take 800 mg by mouth every 6 (six) hours as needed for fever, headache or mild pain (pain score 1-3).     montelukast (SINGULAIR) 10 MG tablet TAKE ONE TABLET (10 MG) BY MOUTH DAILY AT 9PM AT BEDTIME 90 tablet 0   Multiple Vitamin (MULTIVITAMIN) tablet Take 1 tablet by mouth daily.     ondansetron (ZOFRAN) 8 MG tablet Take 1 tablet (8 mg total) by mouth every 8 (eight) hours as needed for nausea or vomiting. Start on the third day after chemotherapy. 30 tablet 1   oxyCODONE (OXY IR/ROXICODONE) 5 MG immediate release tablet Take 1 tablet (5 mg total) by mouth every 6 (six) hours as needed for severe pain (pain score 7-10). 15 tablet 0   pantoprazole (PROTONIX) 20 MG tablet Take 1 tablet (20 mg total) by mouth daily. 30 tablet 0   prochlorperazine (COMPAZINE) 10 MG tablet Take 1 tablet (10 mg total) by mouth every 6 (six) hours as needed for nausea or vomiting. 30 tablet 1   sacubitril-valsartan (ENTRESTO) 97-103 MG Take 1 tablet by mouth 2 (two) times daily. 60 tablet 3   spironolactone (ALDACTONE) 25 MG tablet TAKE ONE TABLET (25 MG) BY MOUTH DAILY 90 tablet 11   sucralfate (CARAFATE) 1 GM/10ML suspension Take 10 mLs (1 g total) by mouth 4 (four) times daily -  with meals and at bedtime. 420 mL 0   traZODone (DESYREL) 150 MG tablet Take 150 mg by mouth at bedtime.     No current facility-administered medications for this visit.    REVIEW  OF SYSTEMS:   Constitutional: ( - ) fevers, ( - )  chills , ( - ) night sweats Eyes: ( - ) blurriness of vision, ( - ) double vision, ( - ) watery eyes Ears, nose, mouth, throat, and face: ( - ) mucositis, ( - ) sore throat Respiratory: ( - ) cough, ( - ) dyspnea, ( - ) wheezes Cardiovascular: ( - ) palpitation, ( - ) chest discomfort, (+) lower extremity swelling Gastrointestinal:  ( - ) nausea, ( + ) heartburn, ( - ) change in  bowel habits Skin: ( - ) abnormal skin rashes Lymphatics: ( - ) new lymphadenopathy, ( - ) easy bruising Neurological: ( - ) numbness, ( - ) tingling, ( - ) new weaknesses Behavioral/Psych: ( - ) mood change, ( - ) new changes  All other systems were reviewed with the patient and are negative.  PHYSICAL EXAMINATION: ECOG PERFORMANCE STATUS: 1 - Symptomatic but completely ambulatory  Vitals:   07/04/24 0942  BP: 100/75  Pulse: 76  Resp: 18  Temp: 97.8 F (36.6 C)  SpO2: 98%   Filed Weights   07/04/24 0942  Weight: 269 lb 1.6 oz (122.1 kg)    GENERAL: well appearing fema;e in NAD  LUNGS: clear to auscultation and percussion with normal breathing effort HEART: regular rate & rhythm and no murmurs.  Bilateral lower extremity edema Musculoskeletal: no cyanosis of digits and no clubbing  PSYCH: alert & oriented x 3, fluent speech NEURO: no focal motor/sensory deficits  LABORATORY DATA:  I have reviewed the data as listed    Latest Ref Rng & Units 07/04/2024    9:24 AM 06/13/2024   10:43 AM 05/27/2024    9:14 PM  CBC  WBC 4.0 - 10.5 K/uL 8.5  10.3  3.3   Hemoglobin 12.0 - 15.0 g/dL 87.9  87.4  86.1   Hematocrit 36.0 - 46.0 % 37.1  39.1  42.8   Platelets 150 - 400 K/uL 287  452  186        Latest Ref Rng & Units 06/13/2024   10:43 AM 05/27/2024    9:14 PM 05/21/2024    9:35 AM  CMP  Glucose 70 - 99 mg/dL 823  891  868   BUN 6 - 20 mg/dL 17  16  16    Creatinine 0.44 - 1.00 mg/dL 9.08  8.87  8.89   Sodium 135 - 145 mmol/L 139  136  140   Potassium 3.5 - 5.1 mmol/L 3.8  4.2  4.3   Chloride 98 - 111 mmol/L 108  101  107   CO2 22 - 32 mmol/L 24  23  24    Calcium 8.9 - 10.3 mg/dL 9.1  9.1  89.9   Total Protein 6.5 - 8.1 g/dL 6.5  6.5  7.6   Total Bilirubin 0.0 - 1.2 mg/dL 0.3  0.7  0.4   Alkaline Phos 38 - 126 U/L 65  62  75   AST 15 - 41 U/L 15  22  15    ALT 0 - 44 U/L 26  25  13     RADIOGRAPHIC STUDIES: I have personally reviewed the radiological images as listed and  agreed with the findings in the report. No results found.  PATHOLOGY:  A. BREAST, LEFT, LUMPECTOMY: Papillary carcinoma with invasion, 5.1 cm, grade 2 Ductal carcinoma in situ: Cribriform, nuclear grade 2 Margins, invasive: Positive     Closest, invasive: Posterior and medial Margins, DCIS: Negative  Closest, DCIS: 1 mm, superior and anterior Lymphovascular invasion: Not identified Prognostic markers:  ER pending, PR pending, Her2 pending, Ki-67 pending Other: Biopsy site and biopsy clip See oncology table Oncology table: INVASIVE CARCINOMA OF THE BREAST:  Resection Procedure: Left breast lumpectomy Specimen Laterality: Left breast Histologic Type: Papillary carcinoma with invasion Histologic Grade:      Glandular (Acinar)/Tubular Differentiation: 3      Nuclear Pleomorphism: 2      Mitotic Rate: 1      Overall Grade: 2 Tumor Size: 5.1 x 2.8 x 2 cm Ductal Carcinoma In Situ: Cribriform with intermediate nuclear grade Lymphatic and/or Vascular Invasion: Not identified Treatment Effect in the Breast: No known presurgical therapy Margins:      Distance from Closest Margin (mm): 0 mm      Specify Closest Margin (required only if <24mm): Posterior and medial DCIS Margins:      Distance from Closest Margin (mm): 1 mm      Specify Closest Margin (required only if <59mm): Superior and anterior Regional Lymph Nodes: No lymph nodes submitted] Distant Metastasis:      Distant Site(s) Involved: Not applicable    The tumor cells are NEGATIVE for Her2 (1+).   Estrogen Receptor:       95%, POSTIVE, STRONG STAINING INTENSITY  Progesterone Receptor:   10%, POSITIVE, MODERATE STAINING INTENSITY  Proliferation Marker Ki-67:   20%   ASSESSMENT & PLAN Janet [name] is a 58 y.o. female who presents to the clinic for continued management of left breast cancer.  #Malignant neoplasm of upper-inner quadrant of left breast in female, estrogen receptor positive: --Underwent left  lumpectomy on 03/13/2024 with pathology review as above.  --High oncotype score of 42 suggests significant chemotherapy benefit. Five-year recurrence risk is 25%. Chemotherapy recommended due to oncotype score and lymph node involvement. Cardiac concerns necessitate reconsideration of initial chemotherapy plan. Decision made to consider less intensive regimen due to cardiac dysfunction.  --Started TC chemotherapy on 05/21/2024.  PLAN: --Due for cycle 3, day 1 today.  Assessment and Plan Assessment & Plan Bone pain after chemotherapy Significant bone pain persists post-chemotherapy, unrelieved by ibuprofen, previously alleviated by oxycodone. - Prescribe short course of oxycodone for bone pain management for this and next chemotherapy round.  Intertrigo (fungal rash in skin folds) Intertrigo present in areas of sweat accumulation, likely fungal. - Prescribe nystatin powder for application to affected areas.  Pruritus associated with chemotherapy Pruritus, particularly on scalp, likely related to chemotherapy. Temporary relief with cortisone cream. - Recommend soothing baby shampoo to alleviate scalp itchiness.   Undergoing chemotherapy with stable blood counts. Experiences tingling in hand, resolving with rehab exercises. - Schedule next round of chemotherapy for September 5th.     All questions were answered. The patient knows to call the clinic with any problems, questions or concerns.  I have spent a total of 30 minutes minutes of face-to-face and non-face-to-face time, preparing to see the patient, performing a medically appropriate examination, counseling and educating the patient, ordering medications/tests/procedures, rdocumenting clinical information in the electronic health record, independently interpreting results and communicating results to the patient, and care coordination.

## 2024-07-07 ENCOUNTER — Inpatient Hospital Stay

## 2024-07-07 VITALS — BP 103/71 | HR 71 | Temp 99.1°F | Resp 16

## 2024-07-07 DIAGNOSIS — Z170 Estrogen receptor positive status [ER+]: Secondary | ICD-10-CM | POA: Diagnosis not present

## 2024-07-07 DIAGNOSIS — Z5111 Encounter for antineoplastic chemotherapy: Secondary | ICD-10-CM | POA: Diagnosis not present

## 2024-07-07 DIAGNOSIS — Z5189 Encounter for other specified aftercare: Secondary | ICD-10-CM | POA: Diagnosis not present

## 2024-07-07 DIAGNOSIS — C50212 Malignant neoplasm of upper-inner quadrant of left female breast: Secondary | ICD-10-CM | POA: Diagnosis not present

## 2024-07-07 DIAGNOSIS — Z79633 Long term (current) use of mitotic inhibitor: Secondary | ICD-10-CM | POA: Diagnosis not present

## 2024-07-07 DIAGNOSIS — Z79630 Long term (current) use of alkylating agent: Secondary | ICD-10-CM | POA: Diagnosis not present

## 2024-07-07 MED ORDER — PEGFILGRASTIM-CBQV 6 MG/0.6ML ~~LOC~~ SOSY
6.0000 mg | PREFILLED_SYRINGE | Freq: Once | SUBCUTANEOUS | Status: AC
  Administered 2024-07-07: 6 mg via SUBCUTANEOUS
  Filled 2024-07-07: qty 0.6

## 2024-07-14 ENCOUNTER — Other Ambulatory Visit: Payer: Self-pay | Admitting: Cardiology

## 2024-07-14 ENCOUNTER — Ambulatory Visit: Payer: Self-pay | Attending: Hematology and Oncology

## 2024-07-14 ENCOUNTER — Other Ambulatory Visit (HOSPITAL_COMMUNITY): Payer: Self-pay

## 2024-07-14 VITALS — Wt 265.2 lb

## 2024-07-14 DIAGNOSIS — Z483 Aftercare following surgery for neoplasm: Secondary | ICD-10-CM | POA: Insufficient documentation

## 2024-07-14 MED ORDER — SACUBITRIL-VALSARTAN 97-103 MG PO TABS: 1.0000 | ORAL_TABLET | Freq: Two times a day (BID) | ORAL | 3 refills | Status: DC

## 2024-07-14 NOTE — Therapy (Signed)
 OUTPATIENT PHYSICAL THERAPY SOZO SCREENING NOTE   Patient Name: Janet [name] MRN: [id number] DOB:1966-07-24, 58 y.o., female Today's Date: 07/14/2024  PCP: Lorren Greig PARAS, NP REFERRING PROVIDER: Loretha Ash, MD   PT End of Session - 07/14/24 1543     Visit Number 2   # unchanged due to screen only   PT Start Time 1541    PT Stop Time 1545    PT Time Calculation (min) 4 min    Activity Tolerance Patient tolerated treatment well    Behavior During Therapy [facility] for tasks assessed/performed          Past Medical History:  Diagnosis Date   Allergy    Anxiety    Arthritis    Asthma    Breast cancer (HCC) 02/28/2024   CHF (congestive heart failure) (HCC)    Congestive heart failure (CHF) (HCC)    Depression    Elevated brain natriuretic peptide (BNP) level    Hypertension    Lymphedema    Nonischemic cardiomyopathy (HCC) 03/2021   Pre-diabetes    SOB (shortness of breath)    Substance abuse (HCC)    Past Surgical History:  Procedure Laterality Date   ABDOMINAL HYSTERECTOMY     AXILLARY SENTINEL NODE BIOPSY  04/08/2024   Procedure: BIOPSY, LYMPH NODE, SENTINEL, AXILLARY;  Surgeon: Vanderbilt Ned, MD;  Location: [facility] OR;  Service: General;;  LEFT SENTINEL LYMPH NODE MAPPING AND BIOPSY X 3   BREAST BIOPSY Right    BREAST BIOPSY Left 10/16/2023   times 3   BREAST BIOPSY Left 10/16/2023   US  LT BREAST BX W LOC DEV EA ADD LESION IMG BX SPEC US  GUIDE 10/16/2023 [facility]   BREAST BIOPSY Left 10/16/2023   US  LT BREAST BX W LOC DEV 1ST LESION IMG BX SPEC US  GUIDE 10/16/2023 [facility]   BREAST BIOPSY Left 10/16/2023   US  LT BREAST BX W LOC DEV EA ADD LESION IMG BX SPEC US  GUIDE 10/16/2023 [facility]   BREAST BIOPSY Left 02/27/2024   US  LT RADIOACTIVE SEED LOC 02/27/2024 [facility]   BREAST BIOPSY Left 02/27/2024   US  LT RADIOACTIVE SEED EA ADD LESION 02/27/2024 [facility]   BREAST LUMPECTOMY Left 04/08/2024   Procedure: BREAST  LUMPECTOMY;  Surgeon: Vanderbilt Ned, MD;  Location: [facility] OR;  Service: General;  Laterality: Left;  LEFT BREAST RE-EXCISION LUMPECTOMY, LEFT SENTINEL LYMPH NODE MAPPING   BREAST LUMPECTOMY WITH RADIOACTIVE SEED LOCALIZATION Left 02/28/2024   Procedure: LEFT BREAST SEED LUMPECTOMY;  Surgeon: Vanderbilt Ned, MD;  Location: [facility] OR;  Service: General;  Laterality: Left;  3 seeds   CESAREAN SECTION     COLONOSCOPY WITH PROPOFOL  N/A 12/10/2023   Procedure: COLONOSCOPY WITH PROPOFOL ;  Surgeon: Leigh Elspeth SQUIBB, MD;  Location: [facility];  Service: Gastroenterology;  Laterality: N/A;   HEMOSTASIS CLIP PLACEMENT  12/10/2023   Procedure: HEMOSTASIS CLIP PLACEMENT;  Surgeon: Leigh Elspeth SQUIBB, MD;  Location: [facility];  Service: Gastroenterology;;   HERNIA REPAIR     umbilical as a child   POLYPECTOMY  12/10/2023   Procedure: POLYPECTOMY;  Surgeon: Leigh Elspeth SQUIBB, MD;  Location: [facility];  Service: Gastroenterology;;   RIGHT/LEFT HEART CATH AND CORONARY ANGIOGRAPHY N/A 04/01/2021   Procedure: RIGHT/LEFT HEART CATH AND CORONARY ANGIOGRAPHY;  Surgeon: Dann Candyce RAMAN, MD;  Location[facility] INVASIVE CV LAB;  Service: Cardiovascular;  Laterality: N/A;   TUBAL LIGATION     Patient Active Problem List   Diagnosis Date Noted   Genetic testing  05/26/2024   Malignant neoplasm of upper-inner quadrant of left breast in female, estrogen receptor positive (HCC) 03/11/2024   Colon cancer screening 12/10/2023   Benign neoplasm of colon 12/10/2023   Vitamin D  deficiency 09/04/2023   Prediabetes 08/03/2022   Acute systolic heart failure (HCC)    Essential hypertension 02/17/2021   Asthma    Lymphedema    Elevated brain natriuretic peptide (BNP) level    DOE (dyspnea on exertion) 07/28/2020   Morbid obesity (HCC) 07/28/2020    REFERRING DIAG: left breast cancer at risk for lymphedema  THERAPY DIAG: Aftercare following surgery for neoplasm  PERTINENT HISTORY: Patient was diagnosed on 4/22/5  with left papillary carcinoma after lumpectomy for papilloma removal which ended up malignant. It measures 5.1 cm. It is ER/PR positive and HER2 negative with a Ki67 of 20%. surgery for margin clearance and SLNB on 04/08/24 with 2/5 nodes positive. My radiation simulation in 05/08/24.  Then antiestrogens.  Chemo depending on how the heart is doing.  Other hx includes CHF with EF of 30-35% and cardiomyopathy.  Pt has primary lymphedema in both legs that started in 10th grade.  Worse after pregnancy.  Wears compression.   PRECAUTIONS: left UE Lymphedema risk, None  SUBJECTIVE: Pt returns for her first 3 month L-Dex screen.   PAIN:  Are you having pain? No  SOZO SCREENING: Patient was assessed today using the SOZO machine to determine the lymphedema index score. This was compared to her baseline score. It was determined that she is within the recommended range when compared to her baseline and no further action is needed at this time. She will continue SOZO screenings. These are done every 3 months for 2 years post operatively followed by every 6 months for 2 years, and then annually.   L-DEX FLOWSHEETS - 07/14/24 1500       L-DEX LYMPHEDEMA SCREENING   Measurement Type Unilateral    L-DEX MEASUREMENT EXTREMITY Upper Extremity    POSITION  Standing    DOMINANT SIDE Right    At Risk Side Left    BASELINE SCORE (UNILATERAL) 14    L-DEX SCORE (UNILATERAL) 15.6    VALUE CHANGE (UNILAT) 1.6            Shavontae, [place], PTA 07/14/2024, 3:44 PM

## 2024-07-15 ENCOUNTER — Other Ambulatory Visit: Payer: Self-pay | Admitting: Family

## 2024-07-15 DIAGNOSIS — J4540 Moderate persistent asthma, uncomplicated: Secondary | ICD-10-CM

## 2024-07-17 ENCOUNTER — Ambulatory Visit (HOSPITAL_BASED_OUTPATIENT_CLINIC_OR_DEPARTMENT_OTHER)
Admission: RE | Admit: 2024-07-17 | Discharge: 2024-07-17 | Disposition: A | Discharge status: Home / Self Care | Source: Ambulatory Visit | Attending: Cardiology | Admitting: Cardiology

## 2024-07-17 ENCOUNTER — Ambulatory Visit (HOSPITAL_COMMUNITY)
Admission: RE | Admit: 2024-07-17 | Discharge: 2024-07-17 | Disposition: A | Discharge status: Home / Self Care | Source: Ambulatory Visit | Attending: Cardiology | Admitting: Cardiology

## 2024-07-17 ENCOUNTER — Encounter (HOSPITAL_COMMUNITY): Payer: Self-pay | Admitting: Cardiology

## 2024-07-17 ENCOUNTER — Telehealth: Payer: Self-pay

## 2024-07-17 VITALS — BP 94/60 | HR 73 | Wt 264.2 lb

## 2024-07-17 DIAGNOSIS — E119 Type 2 diabetes mellitus without complications: Secondary | ICD-10-CM | POA: Insufficient documentation

## 2024-07-17 DIAGNOSIS — I428 Other cardiomyopathies: Secondary | ICD-10-CM | POA: Diagnosis not present

## 2024-07-17 DIAGNOSIS — I5022 Chronic systolic (congestive) heart failure: Secondary | ICD-10-CM

## 2024-07-17 DIAGNOSIS — I10 Essential (primary) hypertension: Secondary | ICD-10-CM | POA: Diagnosis not present

## 2024-07-17 DIAGNOSIS — C50212 Malignant neoplasm of upper-inner quadrant of left female breast: Secondary | ICD-10-CM

## 2024-07-17 DIAGNOSIS — Z6841 Body Mass Index (BMI) 40.0 and over, adult: Secondary | ICD-10-CM | POA: Insufficient documentation

## 2024-07-17 DIAGNOSIS — E669 Obesity, unspecified: Secondary | ICD-10-CM | POA: Insufficient documentation

## 2024-07-17 DIAGNOSIS — C50912 Malignant neoplasm of unspecified site of left female breast: Secondary | ICD-10-CM | POA: Diagnosis not present

## 2024-07-17 DIAGNOSIS — Z7984 Long term (current) use of oral hypoglycemic drugs: Secondary | ICD-10-CM | POA: Insufficient documentation

## 2024-07-17 DIAGNOSIS — I351 Nonrheumatic aortic (valve) insufficiency: Secondary | ICD-10-CM | POA: Insufficient documentation

## 2024-07-17 DIAGNOSIS — Z170 Estrogen receptor positive status [ER+]: Secondary | ICD-10-CM

## 2024-07-17 DIAGNOSIS — I110 Hypertensive heart disease with heart failure: Secondary | ICD-10-CM | POA: Insufficient documentation

## 2024-07-17 DIAGNOSIS — J45909 Unspecified asthma, uncomplicated: Secondary | ICD-10-CM | POA: Insufficient documentation

## 2024-07-17 DIAGNOSIS — Z79899 Other long term (current) drug therapy: Secondary | ICD-10-CM | POA: Diagnosis not present

## 2024-07-17 LAB — ECHOCARDIOGRAM COMPLETE
Area-P 1/2: 3.42 cm2
Calc EF: 42.5 %
S' Lateral: 4.7 cm
Single Plane A2C EF: 39.8 %
Single Plane A4C EF: 44.5 %

## 2024-07-17 MED ORDER — TRIAMCINOLONE ACETONIDE 0.1 % EX CREA: 1.0000 | TOPICAL_CREAM | Freq: Two times a day (BID) | CUTANEOUS | 0 refills | Status: DC

## 2024-07-17 NOTE — Telephone Encounter (Signed)
 Pt called and reports that her rash has not improved, but the Nystatin  powder has been helping areas where rash doesn't get air.   Per MD, order placed for Triamcinolone  crm to apply BID as needed for rash. We will call and check on her Tuesday 9/2. She has MD visit 9/5.

## 2024-07-17 NOTE — Patient Instructions (Signed)
 Go to see you today!  Continue current medications no changes were made  Your physician recommends that you schedule a follow-up appointment in: 3 months (November) Call office in  September to schedule an appointment  If you have any questions or concerns before your next appointment please send us  a message through [place] or call our office at [phone].    TO LEAVE A MESSAGE FOR THE NURSE SELECT OPTION 2, PLEASE LEAVE A MESSAGE INCLUDING: YOUR NAME DATE OF BIRTH CALL BACK NUMBER REASON FOR CALL**this is important as we prioritize the call backs  YOU WILL RECEIVE A CALL BACK THE SAME DAY AS LONG AS YOU CALL BEFORE 4:00 PM At the [facility], you and your health needs are our priority. As part of our continuing mission to provide you with exceptional heart care, we have created designated Provider Care Teams. These Care Teams include your primary Cardiologist (physician) and Advanced Practice Providers (APPs- Physician Assistants and Nurse Practitioners) who all work together to provide you with the care you need, when you need it.   You may see any of the following providers on your designated Care Team at your next follow up: Dr Toribio Fuel Dr Ezra Shuck Dr. Ria Commander Dr. Morene Brownie Amy Lenetta, NP Caffie Shed, [place] [facility] Nixon, [place] Beckey Coe, NP [place] Lee, NP Ellouise Class, NP Tinnie Redman, PharmD Jaun Bash, PharmD   Please be sure to bring in all your medications bottles to every appointment.    Thank you for choosing [facility]

## 2024-07-17 NOTE — Progress Notes (Signed)
  2D Echocardiogram has been performed.  Janet [name] 07/17/2024, 10:55 AM

## 2024-07-22 NOTE — Progress Notes (Signed)
 [facility] NOTE  Referring Physician: Lorren Greig PARAS, NP  Primary Care: Janet Greig PARAS, NP Primary Cardiologist: Janet Passe, MD  HPI: Janet [name] is a very pleasant 58 y.o. female with T2DM, asthma, HTN, obesity, hx of substance abuse and HFrEF presenting today to establish care. Ms. Janet [name] reports being initially diagnosed with CHF in early 2022 after presenting to the hospital with shortness of breath and chest pressure.  She believes her CHF was brought on by use of crack/cocaine.  Since diagnosis to her LVEF has ranged from 25% to 40%.  While she has worked very hard to control her substance use, she believes that her drops and LV function have been due to unfortunate relapses.  Her last relapse was after the death of a close family member.  Interval hx:  - Has done well with current oncologic regimen, docetaxel  and cyclophosphamide  started on 05/21/24 - Less intensive regimen chosen given cardiac history - Discussed improvement in EF today in clinic - Stable NYHA class II symptoms, has been avoiding drugs/alcohol recently, congratulated    Current Outpatient Medications  Medication Sig Dispense Refill   albuterol  (PROVENTIL ) (2.5 MG/3ML) 0.083% nebulizer solution Take 3 mLs (2.5 mg total) by nebulization every 4 (four) hours as needed for wheezing or shortness of breath. 75 mL 2   albuterol  (VENTOLIN  HFA) 108 (90 Base) MCG/ACT inhaler Inhale 2 puffs into the lungs every 6 (six) hours as needed for wheezing or shortness of breath. 18 g 2   Budeson-Glycopyrrol-Formoterol  (BREZTRI  AEROSPHERE) 160-9-4.8 MCG/ACT AERO Inhale 2 puffs into the lungs in the morning and at bedtime. 10.7 g 5   carvedilol  (COREG ) 12.5 MG tablet Take 1 tablet (12.5 mg total) by mouth 2 (two) times daily with a meal. 60 tablet 11   dapagliflozin  propanediol (FARXIGA ) 10 MG TABS tablet Take 1 tablet (10 mg total) by mouth daily before breakfast. 30 tablet 5   dexamethasone   (DECADRON ) 4 MG tablet Take 2 tabs by mouth 2 times daily starting day before chemo. Then take 2 tabs daily for 2 days starting day after chemo. Take with food. 30 tablet 1   DULoxetine (CYMBALTA) 30 MG capsule Take 30 mg by mouth daily.     fluticasone  (FLONASE ) 50 MCG/ACT nasal spray Place 2 sprays into both nostrils daily. 48 g 0   furosemide  (LASIX ) 20 MG tablet TAKE ONE TABLET (20 MG) BY MOUTH DAILY AT 9AM 30 tablet 11   ibuprofen (ADVIL) 800 MG tablet Take 800 mg by mouth every 6 (six) hours as needed for fever, headache or mild pain (pain score 1-3).     montelukast  (SINGULAIR ) 10 MG tablet TAKE ONE TABLET (10 MG) BY MOUTH DAILY AT 9PM AT BEDTIME 90 tablet 0   Multiple Vitamin (MULTIVITAMIN) tablet Take 1 tablet by mouth daily.     nystatin  (MYCOSTATIN /NYSTOP ) powder Apply 1 Application topically 3 (three) times daily. 15 g 0   oxyCODONE  (OXY IR/ROXICODONE ) 5 MG immediate release tablet Take 1 tablet (5 mg total) by mouth every 6 (six) hours as needed for severe pain (pain score 7-10). 6 tablet 0   pantoprazole  (PROTONIX ) 20 MG tablet Take 1 tablet (20 mg total) by mouth daily. 30 tablet 0   sacubitril -valsartan  (ENTRESTO ) 97-103 MG Take 1 tablet by mouth 2 (two) times daily. 60 tablet 3   spironolactone  (ALDACTONE ) 25 MG tablet TAKE ONE TABLET (25 MG) BY MOUTH DAILY 90 tablet 11   traZODone  (DESYREL ) 150 MG tablet Take 150 mg  by mouth at bedtime.     triamcinolone  cream (KENALOG ) 0.1 % Apply 1 Application topically 2 (two) times daily. 60 g 0   No current facility-administered medications for this encounter.      PHYSICAL EXAM: Vitals:   07/17/24 1050  BP: 94/60  Pulse: 73  SpO2: 96%   GENERAL: NAD, well appearing PULM:  Normal work of breathing, CTAB CARDIAC:  JVP: flat         Normal rate with regular rhythm. No murmurs, rubs or gallops.  No edema. Warm and well perfused extremities. ABDOMEN: Soft, non-tender, non-distended. NEUROLOGIC: Patient is oriented x3 with no focal  or lateralizing neurologic deficits.     DATA REVIEW  ECG: Sinus rhythm with LVH  ECHO: 05/16/22: LVEF 40%, RV function normal.  09/22/21: LVEF 30-35%, RV normal. LA moderately dilated.  12/14/22: LVEF 35%; normal RV function.  07/17/2024: LVEF 50-55%, grade I DD, normal RV  CATH: 04/01/21:  LV end diastolic pressure is moderately elevated. There is no aortic valve stenosis. Hemodynamic findings consistent with mild pulmonary hypertension. Ao sat 99%, PA sat 73%, mean PA pressure 33 mmHg, mean PCWP 22 mmHg, cardiac output 6.2 L/min, cardiac index 2.6. No angiographically apparent coronary artery disease. Significant tortuosity noted in the RCA and the circumflex.  CMR (08/07/22): 1. Moderate LVE/LVH with abnormal septal motion and global hypokinesis LVEF 23%  2. Mild non specific mid myocardial gadolinium uptake in anterior wall  3.  Low normal RVEF 45%  4.  Tri leaflet AV with mild AR Regurgitant fraction 19%  5.  Mild Ascending thoracic aorta/sinus dilatation 3.8 cm  ASSESSMENT & PLAN:  Stage C, HFrEF, Nonischemic CMP Etiology of YQ:Wnwpdryzfpr, likely related to subustance abuse LHC 2022  no coronary disease.  NYHA class / AHA Stage:  NYHA II Volume status & Diuretics:   Vasodilators: Continue entresto  97/103mg  BID Beta-Blocker: Continue coreg  12.5mg  BID MRA: Continue spironolactone  25 mg daily Cardiometabolic: Continue Farxiga  10 mg daily Devices therapies & Valvulopathies: 2025 LVEF 50 to 55%, congratulated on improvement Advanced therapies:Not indicated 07/22/2024: Significant improvement in LV function, blood pressure limits further titration of heart failure therapy.  Continue current chemotherapy regimen with close follow-up, no specific cardiac monitoring  2. HTN - Well-controlled on medical regimen as above  3. Obesity  -Body mass index is 40.17 kg/m. = Plan to start GLP-1 RA in the future  4. Substance abuse - She has not used any drugs in over 10 months  now.  Congratulated.  5. L Breast cancer -Now status post lumpectomy with negative margins.  However did have positive sentinel lymph node. -Currently on TC chemotherapy, recently completed cycle 3, reviewed recent oncology notes   Morene Brownie 6:15 AM

## 2024-07-24 ENCOUNTER — Encounter: Payer: Self-pay | Admitting: *Deleted

## 2024-07-24 DIAGNOSIS — C50212 Malignant neoplasm of upper-inner quadrant of left female breast: Secondary | ICD-10-CM

## 2024-07-25 ENCOUNTER — Inpatient Hospital Stay: Attending: Hematology and Oncology

## 2024-07-25 ENCOUNTER — Inpatient Hospital Stay

## 2024-07-25 ENCOUNTER — Inpatient Hospital Stay: Admitting: Hematology and Oncology

## 2024-07-25 VITALS — BP 101/69 | HR 86 | Temp 98.5°F | Resp 16 | Wt 266.2 lb

## 2024-07-25 DIAGNOSIS — R21 Rash and other nonspecific skin eruption: Secondary | ICD-10-CM | POA: Diagnosis not present

## 2024-07-25 DIAGNOSIS — Z79630 Long term (current) use of alkylating agent: Secondary | ICD-10-CM | POA: Insufficient documentation

## 2024-07-25 DIAGNOSIS — C50212 Malignant neoplasm of upper-inner quadrant of left female breast: Secondary | ICD-10-CM

## 2024-07-25 DIAGNOSIS — Z5111 Encounter for antineoplastic chemotherapy: Secondary | ICD-10-CM | POA: Diagnosis present

## 2024-07-25 DIAGNOSIS — Z79633 Long term (current) use of mitotic inhibitor: Secondary | ICD-10-CM | POA: Diagnosis not present

## 2024-07-25 DIAGNOSIS — M898X9 Other specified disorders of bone, unspecified site: Secondary | ICD-10-CM | POA: Diagnosis not present

## 2024-07-25 DIAGNOSIS — Z5189 Encounter for other specified aftercare: Secondary | ICD-10-CM | POA: Diagnosis not present

## 2024-07-25 DIAGNOSIS — Z170 Estrogen receptor positive status [ER+]: Secondary | ICD-10-CM

## 2024-07-25 LAB — CBC WITH DIFFERENTIAL (CANCER CENTER ONLY)
Abs Immature Granulocytes: 0.06 K/uL (ref 0.00–0.07)
Basophils Absolute: 0 K/uL (ref 0.0–0.1)
Basophils Relative: 0 %
Eosinophils Absolute: 0 K/uL (ref 0.0–0.5)
Eosinophils Relative: 0 %
HCT: 36.7 % (ref 36.0–46.0)
Hemoglobin: 12.1 g/dL (ref 12.0–15.0)
Immature Granulocytes: 1 %
Lymphocytes Relative: 14 %
Lymphs Abs: 1.5 K/uL (ref 0.7–4.0)
MCH: 27.5 pg (ref 26.0–34.0)
MCHC: 33 g/dL (ref 30.0–36.0)
MCV: 83.4 fL (ref 80.0–100.0)
Monocytes Absolute: 0.5 K/uL (ref 0.1–1.0)
Monocytes Relative: 5 %
Neutro Abs: 8.8 K/uL — ABNORMAL HIGH (ref 1.7–7.7)
Neutrophils Relative %: 80 %
Platelet Count: 338 K/uL (ref 150–400)
RBC: 4.4 MIL/uL (ref 3.87–5.11)
RDW: 20.1 % — ABNORMAL HIGH (ref 11.5–15.5)
WBC Count: 10.9 K/uL — ABNORMAL HIGH (ref 4.0–10.5)
nRBC: 0.5 % — ABNORMAL HIGH (ref 0.0–0.2)

## 2024-07-25 LAB — CMP (CANCER CENTER ONLY)
ALT: 24 U/L (ref 0–44)
AST: 20 U/L (ref 15–41)
Albumin: 4 g/dL (ref 3.5–5.0)
Alkaline Phosphatase: 58 U/L (ref 38–126)
Anion gap: 5 (ref 5–15)
BUN: 18 mg/dL (ref 6–20)
CO2: 27 mmol/L (ref 22–32)
Calcium: 9.6 mg/dL (ref 8.9–10.3)
Chloride: 107 mmol/L (ref 98–111)
Creatinine: 0.87 mg/dL (ref 0.44–1.00)
GFR, Estimated: >60 mL/min (ref >60)
Glucose, Bld: 118 mg/dL — ABNORMAL HIGH (ref 70–99)
Potassium: 4.2 mmol/L (ref 3.5–5.1)
Sodium: 139 mmol/L (ref 135–145)
Total Bilirubin: 0.4 mg/dL (ref 0.0–1.2)
Total Protein: 6.6 g/dL (ref 6.5–8.1)

## 2024-07-25 MED ORDER — SODIUM CHLORIDE 0.9 % IV SOLN
600.0000 mg/m2 | Freq: Once | INTRAVENOUS | Status: AC
  Administered 2024-07-25: 1500 mg via INTRAVENOUS
  Filled 2024-07-25: qty 75

## 2024-07-25 MED ORDER — PALONOSETRON HCL INJECTION 0.25 MG/5ML
0.2500 mg | Freq: Once | INTRAVENOUS | Status: AC
  Administered 2024-07-25: 0.25 mg via INTRAVENOUS
  Filled 2024-07-25: qty 5

## 2024-07-25 MED ORDER — DEXAMETHASONE SODIUM PHOSPHATE 10 MG/ML IJ SOLN
10.0000 mg | Freq: Once | INTRAMUSCULAR | Status: AC
  Administered 2024-07-25: 10 mg via INTRAVENOUS
  Filled 2024-07-25: qty 1

## 2024-07-25 MED ORDER — SODIUM CHLORIDE 0.9 % IV SOLN: INTRAVENOUS | Status: DC

## 2024-07-25 MED ORDER — SODIUM CHLORIDE 0.9 % IV SOLN
75.0000 mg/m2 | Freq: Once | INTRAVENOUS | Status: AC
  Administered 2024-07-25: 179 mg via INTRAVENOUS
  Filled 2024-07-25: qty 17.9

## 2024-07-25 NOTE — Patient Instructions (Signed)
 [facility] - A DEPT OF [facility]  Discharge Instructions: Thank you for choosing [facility] to provide your oncology and hematology care.   If you have a lab appointment with the Cancer Center, please go directly to the Cancer Center and check in at the registration area.   Wear comfortable clothing and clothing appropriate for easy access to any Portacath or PICC line.   We strive to give you quality time with your provider. You may need to reschedule your appointment if you arrive late (15 or more minutes).  Arriving late affects you and other patients whose appointments are after yours.  Also, if you miss three or more appointments without notifying the office, you may be dismissed from the clinic at the provider's discretion.      For prescription refill requests, have your pharmacy contact our office and allow 72 hours for refills to be completed.    Today you received the following chemotherapy and/or immunotherapy agents taxotere and cytoxan      To help prevent nausea and vomiting after your treatment, we encourage you to take your nausea medication as directed.  BELOW ARE SYMPTOMS THAT SHOULD BE REPORTED IMMEDIATELY: *FEVER GREATER THAN 100.4 F (38 C) OR HIGHER *CHILLS OR SWEATING *NAUSEA AND VOMITING THAT IS NOT CONTROLLED WITH YOUR NAUSEA MEDICATION *UNUSUAL SHORTNESS OF BREATH *UNUSUAL BRUISING OR BLEEDING *URINARY PROBLEMS (pain or burning when urinating, or frequent urination) *BOWEL PROBLEMS (unusual diarrhea, constipation, pain near the anus) TENDERNESS IN MOUTH AND THROAT WITH OR WITHOUT PRESENCE OF ULCERS (sore throat, sores in mouth, or a toothache) UNUSUAL RASH, SWELLING OR PAIN  UNUSUAL VAGINAL DISCHARGE OR ITCHING   Items with * indicate a potential emergency and should be followed up as soon as possible or go to the Emergency Department if any problems should occur.  Please show the CHEMOTHERAPY ALERT CARD or  IMMUNOTHERAPY ALERT CARD at check-in to the Emergency Department and triage nurse.  Should you have questions after your visit or need to cancel or reschedule your appointment, please contact [facility] - A DEPT OF [facility]  Dept: [phone]  and follow the prompts.  Office hours are 8:00 a.m. to 4:30 p.m. Monday - Friday. Please note that voicemails left after 4:00 p.m. may not be returned until the following business day.  We are closed weekends and major holidays. You have access to a nurse at all times for urgent questions. Please call the main number to the clinic Dept: [phone] and follow the prompts.   For any non-urgent questions, you may also contact your provider using MyChart. We now offer e-Visits for anyone 35 and older to request care online for non-urgent symptoms. For details visit [email or web address].   Also download the MyChart app! Go to the app store, search MyChart, open the app, select [place], and log in with your MyChart username and password.

## 2024-07-25 NOTE — Progress Notes (Signed)
 [facility] [phone]   [phone]  PROGRESS NOTE  Patient Care Team: Lorren Greig PARAS, NP as PCP - General (Nurse Practitioner) Nahser, Aleene PARAS, MD (Inactive) as PCP - Cardiology (Cardiology) Nahser, Aleene PARAS, MD (Inactive) as Consulting Physician (Cardiology) Glean Stephane BROCKS, RN (Inactive) as Oncology Nurse Navigator Tyree Nanetta SAILOR, RN as Oncology Nurse Navigator Loretha Ash, MD as Consulting Physician (Hematology and Oncology)   CHIEF COMPLAINTS/PURPOSE OF CONSULTATION:  Left breast cancer  Oncology History  Malignant neoplasm of upper-inner quadrant of left breast in female, estrogen receptor positive (HCC)  10/11/2023 Mammogram   There is a dominant solid mass at the palpable site of concern in the left breast at 6 o'clock measuring 4.2 cm. There are multiple other smaller solid-appearing masses in the left breast at 8 o'clock, 9 o'clock and 10 o'clock. No evidence of left axillary lymphadenopathy. No evidence of malignancy in the right breast.   03/11/2024 Initial Diagnosis   Malignant neoplasm of upper-inner quadrant of left breast in female, estrogen receptor positive (HCC)   03/11/2024 Cancer Staging   Staging form: Breast, AJCC 8th Edition - Pathologic: Stage IB (pT3, pN1(sn), cM0, G2, ER+, PR+, HER2-) - Signed by Loretha Ash, MD on 05/09/2024 Method of lymph node assessment: Sentinel lymph node biopsy Histologic grading system: 3 grade system   03/13/2024 Pathology Results   Left lumpectomy  Papillary carcinoma with invasion, 5.1 cm, grade 2  Ductal carcinoma in situ: Cribriform, nuclear grade 2  Margins, invasive: Positive      Closest, invasive: Posterior and medial  Margins, DCIS: Negative      Closest, DCIS: 1 mm, superior and anterior  Lymphovascular invasion: Not identified   The tumor cells are NEGATIVE for Her2 (1+).   Estrogen Receptor:       95%, POSTIVE, STRONG STAINING INTENSITY  Progesterone Receptor:    10%, POSITIVE, MODERATE STAINING INTENSITY  Proliferation Marker Ki-67:   20%    05/21/2024 -  Chemotherapy   Patient is on Treatment Plan : BREAST TC q21d      Genetic Testing   [facility] CancerNext-Expanded Panel+RNA was Negative. Of note, a variant of uncertain significance was detected in the GATA2 gene (p.A342T). Report date is 05/20/2024.  The CancerNext-Expanded gene panel offered by [facility] and includes sequencing, rearrangement, and RNA analysis for the following 77 genes: AIP, ALK, APC, ATM, AXIN2, BAP1, BARD1, BMPR1A, BRCA1, BRCA2, BRIP1, CDC73, CDH1, CDK4, CDKN1B, CDKN2A, CEBPA, CHEK2, CTNNA1, DDX41, DICER1, ETV6, FH, FLCN, GATA2, LZTR1, MAX, MBD4, MEN1, MET, MLH1, MSH2, MSH3, MSH6, MUTYH, NF1, NF2, NTHL1, PALB2, PHOX2B, PMS2, POT1, PRKAR1A, PTCH1, PTEN, RAD51C, RAD51D, RB1, RET, RPS20, RUNX1, SDHA, SDHAF2, SDHB, SDHC, SDHD, SMAD4, SMARCA4, SMARCB1, SMARCE1, STK11, SUFU, TMEM127, TP53, TSC1, TSC2, VHL, and WT1 (sequencing and deletion/duplication); EGFR, HOXB13, KIT, MITF, PDGFRA, POLD1, and POLE (sequencing only); EPCAM and GREM1 (deletion/duplication only).      CURRENT TREATMENT: Taxotere  and Cytoxan   INTERVAL HISTORY:  Janet [name] 58 y.o. female Janet [name] returns for toxicity check prior to cycle 3-day 1 of TC chemotherapy.   Discussed the use of AI scribe software for clinical note transcription with the patient, who gave verbal consent to proceed.  History of Present Illness   Janet [name] is a 58 year old female undergoing chemotherapy who presents for follow-up of treatment side effects.  She experienced severe rashes during her third cycle of chemotherapy, located on her thighs, legs, hands, and feet, described as 'horrible'.  She also experienced significant  bone pain, particularly in her knees and left ankle. She anticipates this pain may recur. She used pain medication and has two pills remaining.  No mouth sores are present, but there is  soreness at the site of her first chemotherapy, which is improving.  Rest of the pertinent 10 ROS reviewed and neg  MEDICAL HISTORY:  Past Medical History:  Diagnosis Date   Allergy    Anxiety    Arthritis    Asthma    Breast cancer (HCC) 02/28/2024   CHF (congestive heart failure) (HCC)    Congestive heart failure (CHF) (HCC)    Depression    Elevated brain natriuretic peptide (BNP) level    Hypertension    Lymphedema    Nonischemic cardiomyopathy (HCC) 03/2021   Pre-diabetes    SOB (shortness of breath)    Substance abuse (HCC)     SURGICAL HISTORY: Past Surgical History:  Procedure Laterality Date   ABDOMINAL HYSTERECTOMY     AXILLARY SENTINEL NODE BIOPSY  04/08/2024   Procedure: BIOPSY, LYMPH NODE, SENTINEL, AXILLARY;  Surgeon: Vanderbilt Ned, MD;  Location: [facility] OR;  Service: General;;  LEFT SENTINEL LYMPH NODE MAPPING AND BIOPSY X 3   BREAST BIOPSY Right    BREAST BIOPSY Left 10/16/2023   times 3   BREAST BIOPSY Left 10/16/2023   US  LT BREAST BX W LOC DEV EA ADD LESION IMG BX SPEC US  GUIDE 10/16/2023 [facility]   BREAST BIOPSY Left 10/16/2023   US  LT BREAST BX W LOC DEV 1ST LESION IMG BX SPEC US  GUIDE 10/16/2023 [facility]   BREAST BIOPSY Left 10/16/2023   US  LT BREAST BX W LOC DEV EA ADD LESION IMG BX SPEC US  GUIDE 10/16/2023 [facility]   BREAST BIOPSY Left 02/27/2024   US  LT RADIOACTIVE SEED LOC 02/27/2024 [facility]   BREAST BIOPSY Left 02/27/2024   US  LT RADIOACTIVE SEED EA ADD LESION 02/27/2024 [facility]   BREAST LUMPECTOMY Left 04/08/2024   Procedure: BREAST LUMPECTOMY;  Surgeon: Vanderbilt Ned, MD;  Location: [facility];  Service: General;  Laterality: Left;  LEFT BREAST RE-EXCISION LUMPECTOMY, LEFT SENTINEL LYMPH NODE MAPPING   BREAST LUMPECTOMY WITH RADIOACTIVE SEED LOCALIZATION Left 02/28/2024   Procedure: LEFT BREAST SEED LUMPECTOMY;  Surgeon: Vanderbilt Ned, MD;  Location: [facility] OR;  Service: General;  Laterality: Left;  3 seeds    CESAREAN SECTION     COLONOSCOPY WITH PROPOFOL  N/A 12/10/2023   Procedure: COLONOSCOPY WITH PROPOFOL ;  Surgeon: Leigh Elspeth SQUIBB, MD;  Location: [facility];  Service: Gastroenterology;  Laterality: N/A;   HEMOSTASIS CLIP PLACEMENT  12/10/2023   Procedure: HEMOSTASIS CLIP PLACEMENT;  Surgeon: Leigh Elspeth SQUIBB, MD;  Location: [facility];  Service: Gastroenterology;;   HERNIA REPAIR     umbilical as a child   POLYPECTOMY  12/10/2023   Procedure: POLYPECTOMY;  Surgeon: Leigh Elspeth SQUIBB, MD;  Location: [facility];  Service: Gastroenterology;;   RIGHT/LEFT HEART CATH AND CORONARY ANGIOGRAPHY N/A 04/01/2021   Procedure: RIGHT/LEFT HEART CATH AND CORONARY ANGIOGRAPHY;  Surgeon: Dann Candyce RAMAN, MD;  Location: [facility]VASIVE CV LAB;  Service: Cardiovascular;  Laterality: N/A;   TUBAL LIGATION      SOCIAL HISTORY: Social History   Socioeconomic History   Marital status: Single    Spouse name: Not on file   Number of children: 2   Years of education: 14   Highest education level: Associate degree: academic program  Occupational History   Occupation: Consulting civil engineer   Occupation: disabled  Tobacco Use  Smoking status: Never    Passive exposure: Current   Smokeless tobacco: Never  Vaping Use   Vaping status: Never Used  Substance and Sexual Activity   Alcohol use: Yes    Alcohol/week: 3.0 standard drinks of alcohol    Types: 3 Shots of liquor per week    Comment: 1 pint every other week   Drug use: Yes    Frequency: 1.0 times per week    Types: Marijuana, Cocaine    Comment: Pt has not sued cocaine in 6 months ( as of 04/07/24) twice a week   Sexual activity: Not Currently    Birth control/protection: Surgical  Other Topics Concern   Not on file  Social History Narrative   Not on file   Social Drivers of Health   Financial Resource Strain: Low Risk  (01/03/2024)   Overall Financial Resource Strain (CARDIA)    Difficulty of Paying Living Expenses: Not very hard  Food  Insecurity: Food Insecurity Present (03/13/2024)   Hunger Vital Sign    Worried About Running Out of Food in the Last Year: Never true    Ran Out of Food in the Last Year: Sometimes true  Transportation Needs: Unmet Transportation Needs (05/05/2024)   PRAPARE - Administrator, Civil Service (Medical): Yes    Lack of Transportation (Non-Medical): No  Physical Activity: Inactive (01/03/2024)   Exercise Vital Sign    Days of Exercise per Week: 0 days    Minutes of Exercise per Session: 0 min  Stress: Stress Concern Present (01/03/2024)   [employer] of Occupational Health - Occupational Stress Questionnaire    Feeling of Stress : Very much  Social Connections: Socially Isolated (01/03/2024)   Social Connection and Isolation Panel    Frequency of Communication with Friends and Family: More than three times a week    Frequency of Social Gatherings with Friends and Family: Once a week    Attends Religious Services: Never    Database administrator or Organizations: No    Attends Engineer, structural: Not on file    Marital Status: Never married  Intimate Partner Violence: Not At Risk (03/13/2024)   Humiliation, Afraid, Rape, and Kick questionnaire    Fear of Current or Ex-Partner: No    Emotionally Abused: No    Physically Abused: No    Sexually Abused: No    FAMILY HISTORY: Family History  Problem Relation Age of Onset   Colon polyps Mother    Hypertension Mother    Stroke Mother    Deep vein thrombosis Mother    Congestive Heart Failure Mother    Arthritis Mother    Asthma Mother    Depression Mother    Other Father        history unknown   Obesity Sister    Diabetes Sister    Obesity Brother    Lung cancer Maternal Uncle        smoked   Congestive Heart Failure Maternal Grandmother    Throat cancer Maternal Grandfather        smoked   Colon cancer Neg Hx    Esophageal cancer Neg Hx    Rectal cancer Neg Hx    Stomach cancer Neg Hx      ALLERGIES:  is allergic to clarithromycin, penicillins, and penicillin g.  MEDICATIONS:  Current Outpatient Medications  Medication Sig Dispense Refill   albuterol  (PROVENTIL ) (2.5 MG/3ML) 0.083% nebulizer solution Take 3 mLs (2.5 mg total) by nebulization  every 4 (four) hours as needed for wheezing or shortness of breath. 75 mL 2   albuterol  (VENTOLIN  HFA) 108 (90 Base) MCG/ACT inhaler Inhale 2 puffs into the lungs every 6 (six) hours as needed for wheezing or shortness of breath. 18 g 2   Budeson-Glycopyrrol-Formoterol  (BREZTRI  AEROSPHERE) 160-9-4.8 MCG/ACT AERO Inhale 2 puffs into the lungs in the morning and at bedtime. 10.7 g 5   carvedilol  (COREG ) 12.5 MG tablet Take 1 tablet (12.5 mg total) by mouth 2 (two) times daily with a meal. 60 tablet 11   dapagliflozin  propanediol (FARXIGA ) 10 MG TABS tablet Take 1 tablet (10 mg total) by mouth daily before breakfast. 30 tablet 5   dexamethasone  (DECADRON ) 4 MG tablet Take 2 tabs by mouth 2 times daily starting day before chemo. Then take 2 tabs daily for 2 days starting day after chemo. Take with food. 30 tablet 1   DULoxetine (CYMBALTA) 30 MG capsule Take 30 mg by mouth daily.     fluticasone  (FLONASE ) 50 MCG/ACT nasal spray Place 2 sprays into both nostrils daily. 48 g 0   furosemide  (LASIX ) 20 MG tablet TAKE ONE TABLET (20 MG) BY MOUTH DAILY AT 9AM 30 tablet 11   ibuprofen (ADVIL) 800 MG tablet Take 800 mg by mouth every 6 (six) hours as needed for fever, headache or mild pain (pain score 1-3).     montelukast  (SINGULAIR ) 10 MG tablet TAKE ONE TABLET (10 MG) BY MOUTH DAILY AT 9PM AT BEDTIME 90 tablet 0   Multiple Vitamin (MULTIVITAMIN) tablet Take 1 tablet by mouth daily.     nystatin  (MYCOSTATIN /NYSTOP ) powder Apply 1 Application topically 3 (three) times daily. 15 g 0   oxyCODONE  (OXY IR/ROXICODONE ) 5 MG immediate release tablet Take 1 tablet (5 mg total) by mouth every 6 (six) hours as needed for severe pain (pain score 7-10). 6 tablet 0    pantoprazole  (PROTONIX ) 20 MG tablet Take 1 tablet (20 mg total) by mouth daily. 30 tablet 0   sacubitril -valsartan  (ENTRESTO ) 97-103 MG Take 1 tablet by mouth 2 (two) times daily. 60 tablet 3   spironolactone  (ALDACTONE ) 25 MG tablet TAKE ONE TABLET (25 MG) BY MOUTH DAILY 90 tablet 11   traZODone  (DESYREL ) 150 MG tablet Take 150 mg by mouth at bedtime.     triamcinolone  cream (KENALOG ) 0.1 % Apply 1 Application topically 2 (two) times daily. 60 g 0   No current facility-administered medications for this visit.    REVIEW OF SYSTEMS:   Constitutional: ( - ) fevers, ( - )  chills , ( - ) night sweats Eyes: ( - ) blurriness of vision, ( - ) double vision, ( - ) watery eyes Ears, nose, mouth, throat, and face: ( - ) mucositis, ( - ) sore throat Respiratory: ( - ) cough, ( - ) dyspnea, ( - ) wheezes Cardiovascular: ( - ) palpitation, ( - ) chest discomfort, (+) lower extremity swelling Gastrointestinal:  ( - ) nausea, ( + ) heartburn, ( - ) change in bowel habits Skin: ( - ) abnormal skin rashes Lymphatics: ( - ) new lymphadenopathy, ( - ) easy bruising Neurological: ( - ) numbness, ( - ) tingling, ( - ) new weaknesses Behavioral/Psych: ( - ) mood change, ( - ) new changes  All other systems were reviewed with the patient and are negative.  PHYSICAL EXAMINATION: ECOG PERFORMANCE STATUS: 1 - Symptomatic but completely ambulatory  Vitals:   07/25/24 1156  BP: 101/69  Pulse: 86  Resp: 16  Temp: 98.5 F (36.9 C)  SpO2: 100%   Filed Weights   07/25/24 1156  Weight: 266 lb 3.2 oz (120.7 kg)    GENERAL: well appearing fema;e in NAD  LUNGS: clear to auscultation and percussion with normal breathing effort HEART: regular rate & rhythm and no murmurs.  Bilateral lower extremity edema Musculoskeletal: no cyanosis of digits and no clubbing  PSYCH: alert & oriented x 3, fluent speech NEURO: no focal motor/sensory deficits  LABORATORY DATA:  I have reviewed the data as listed     Latest Ref Rng & Units 07/25/2024   11:29 AM 07/04/2024    9:24 AM 06/13/2024   10:43 AM  CBC  WBC 4.0 - 10.5 K/uL 10.9  8.5  10.3   Hemoglobin 12.0 - 15.0 g/dL 87.8  87.9  87.4   Hematocrit 36.0 - 46.0 % 36.7  37.1  39.1   Platelets 150 - 400 K/uL 338  287  452        Latest Ref Rng & Units 07/04/2024    9:24 AM 06/13/2024   10:43 AM 05/27/2024    9:14 PM  CMP  Glucose 70 - 99 mg/dL 873  823  891   BUN 6 - 20 mg/dL 17  17  16    Creatinine 0.44 - 1.00 mg/dL 9.04  9.08  8.87   Sodium 135 - 145 mmol/L 141  139  136   Potassium 3.5 - 5.1 mmol/L 4.3  3.8  4.2   Chloride 98 - 111 mmol/L 109  108  101   CO2 22 - 32 mmol/L 25  24  23    Calcium 8.9 - 10.3 mg/dL 9.2  9.1  9.1   Total Protein 6.5 - 8.1 g/dL 6.3  6.5  6.5   Total Bilirubin 0.0 - 1.2 mg/dL 0.3  0.3  0.7   Alkaline Phos 38 - 126 U/L 60  65  62   AST 15 - 41 U/L 16  15  22    ALT 0 - 44 U/L 19  26  25     RADIOGRAPHIC STUDIES: I have personally reviewed the radiological images as listed and agreed with the findings in the report. ECHOCARDIOGRAM COMPLETE Result Date: 07/17/2024    ECHOCARDIOGRAM REPORT   Patient Name:   Janet [name] Date of Exam: 07/17/2024 Medical Rec #:  [id number]           Height:       68.0 in Accession #:    [id number]          Weight:       265.2 lb Date of Birth:  October 14, 1966           BSA:          2.304 m Patient Age:    57 years            BP:           112/67 mmHg Patient Gender: F                   HR:           68 bpm. Exam Location:  Outpatient Procedure: 2D Echo (Both Spectral and Color Flow Doppler were utilized during            procedure). Indications:    congestive heart failure  History:        Patient has prior history of Echocardiogram examinations, most  recent 04/07/2024. Risk Factors:Hypertension.  Sonographer:    Tinnie Barefoot RDCS Referring Phys: Janet [name] IMPRESSIONS  1. Left ventricular ejection fraction, by estimation, is 50 to 55%. The left ventricle has low normal  function. The left ventricle has no regional wall motion abnormalities. Left ventricular diastolic parameters are consistent with Grade I diastolic dysfunction (impaired relaxation).  2. Right ventricular systolic function is normal. The right ventricular size is normal.  3. Left atrial size was mildly dilated.  4. The mitral valve is normal in structure. No evidence of mitral valve regurgitation. No evidence of mitral stenosis.  5. The aortic valve is normal in structure. There is eccentric poorly visualized aortic regurgitation that appears mild to moderate.  6. Aortic dilatation noted. There is dilatation of the aortic root, measuring 40 mm. There is dilatation of the ascending aorta, measuring 38 mm.  7. The inferior vena cava is normal in size with greater than 50% respiratory variability, suggesting right atrial pressure of 3 mmHg. FINDINGS  Left Ventricle: Left ventricular ejection fraction, by estimation, is 50 to 55%. The left ventricle has low normal function. The left ventricle has no regional wall motion abnormalities. The left ventricular internal cavity size was normal in size. There is no left ventricular hypertrophy. Left ventricular diastolic parameters are consistent with Grade I diastolic dysfunction (impaired relaxation). Right Ventricle: The right ventricular size is normal. No increase in right ventricular wall thickness. Right ventricular systolic function is normal. Left Atrium: Left atrial size was mildly dilated. Right Atrium: Right atrial size was normal in size. Pericardium: There is no evidence of pericardial effusion. Mitral Valve: The mitral valve is normal in structure. No evidence of mitral valve regurgitation. No evidence of mitral valve stenosis. Tricuspid Valve: The tricuspid valve is normal in structure. Tricuspid valve regurgitation is not demonstrated. No evidence of tricuspid stenosis. Aortic Valve: The aortic valve is normal in structure. Aortic valve regurgitation is mild to  moderate. No aortic stenosis is present. Pulmonic Valve: The pulmonic valve was normal in structure. Pulmonic valve regurgitation is not visualized. No evidence of pulmonic stenosis. Aorta: Aortic dilatation noted. There is dilatation of the aortic root, measuring 40 mm. There is dilatation of the ascending aorta, measuring 38 mm. Venous: The inferior vena cava is normal in size with greater than 50% respiratory variability, suggesting right atrial pressure of 3 mmHg. IAS/Shunts: No atrial level shunt detected by color flow Doppler.  LEFT VENTRICLE PLAX 2D LVIDd:         5.60 cm      Diastology LVIDs:         4.70 cm      LV e' medial:    4.57 cm/s LV PW:         1.10 cm      LV E/e' medial:  13.4 LV IVS:        1.10 cm      LV e' lateral:   7.18 cm/s LVOT diam:     2.40 cm      LV E/e' lateral: 8.5 LV SV:         75 LV SV Index:   33 LVOT Area:     4.52 cm  LV Volumes (MOD) LV vol d, MOD A2C: 163.0 ml LV vol d, MOD A4C: 155.0 ml LV vol s, MOD A2C: 98.1 ml LV vol s, MOD A4C: 86.1 ml LV SV MOD A2C:     64.9 ml LV SV MOD A4C:     155.0 ml  LV SV MOD BP:      68.1 ml RIGHT VENTRICLE             IVC RV Basal diam:  3.00 cm     IVC diam: 2.00 cm RV S prime:     10.80 cm/s TAPSE (M-mode): 1.4 cm LEFT ATRIUM             Index        RIGHT ATRIUM           Index LA diam:        3.50 cm 1.52 cm/m   RA Area:     15.20 cm LA Vol (A2C):   58.4 ml 25.34 ml/m  RA Volume:   41.00 ml  17.79 ml/m LA Vol (A4C):   84.1 ml 36.50 ml/m LA Biplane Vol: 70.7 ml 30.68 ml/m  AORTIC VALVE LVOT Vmax:   82.80 cm/s LVOT Vmean:  56.600 cm/s LVOT VTI:    0.166 m  AORTA Ao Root diam: 4.10 cm Ao Asc diam:  3.80 cm MITRAL VALVE MV Area (PHT): 3.42 cm    SHUNTS MV Decel Time: 222 msec    Systemic VTI:  0.17 m MV E velocity: 61.30 cm/s  Systemic Diam: 2.40 cm MV A velocity: 51.40 cm/s MV E/A ratio:  1.19 Aditya Sabharwal Electronically signed by Ria Commander Signature Date/Time: 07/17/2024/11:10:27 AM    Final     PATHOLOGY:  A. BREAST,  LEFT, LUMPECTOMY: Papillary carcinoma with invasion, 5.1 cm, grade 2 Ductal carcinoma in situ: Cribriform, nuclear grade 2 Margins, invasive: Positive     Closest, invasive: Posterior and medial Margins, DCIS: Negative     Closest, DCIS: 1 mm, superior and anterior Lymphovascular invasion: Not identified Prognostic markers:  ER pending, PR pending, Her2 pending, Ki-67 pending Other: Biopsy site and biopsy clip See oncology table Oncology table: INVASIVE CARCINOMA OF THE BREAST:  Resection Procedure: Left breast lumpectomy Specimen Laterality: Left breast Histologic Type: Papillary carcinoma with invasion Histologic Grade:      Glandular (Acinar)/Tubular Differentiation: 3      Nuclear Pleomorphism: 2      Mitotic Rate: 1      Overall Grade: 2 Tumor Size: 5.1 x 2.8 x 2 cm Ductal Carcinoma In Situ: Cribriform with intermediate nuclear grade Lymphatic and/or Vascular Invasion: Not identified Treatment Effect in the Breast: No known presurgical therapy Margins:      Distance from Closest Margin (mm): 0 mm      Specify Closest Margin (required only if <76mm): Posterior and medial DCIS Margins:      Distance from Closest Margin (mm): 1 mm      Specify Closest Margin (required only if <4mm): Superior and anterior Regional Lymph Nodes: No lymph nodes submitted] Distant Metastasis:      Distant Site(s) Involved: Not applicable    The tumor cells are NEGATIVE for Her2 (1+).   Estrogen Receptor:       95%, POSTIVE, STRONG STAINING INTENSITY  Progesterone Receptor:   10%, POSITIVE, MODERATE STAINING INTENSITY  Proliferation Marker Ki-67:   20%   ASSESSMENT & PLAN Janet [name] is a 58 y.o. female who presents to the clinic for continued management of left breast cancer.  #Malignant neoplasm of upper-inner quadrant of left breast in female, estrogen receptor positive: --Underwent left lumpectomy on 03/13/2024 with pathology review as above.  --High oncotype score of 42  suggests significant chemotherapy benefit. Five-year recurrence risk is 25%. Chemotherapy recommended due to oncotype score and lymph node involvement.  Cardiac concerns necessitate reconsideration of initial chemotherapy plan. Decision made to consider less intensive regimen due to cardiac dysfunction.  --Started TC chemotherapy on 05/21/2024.  PLAN: --Due for cycle 4, day 1 today.   Assessment and Plan Assessment & Plan Breast cancer, stage I, status post chemotherapy and planned radiation Stage I breast cancer, post-chemotherapy with manageable side effects. Radiation therapy planned. - Coordinate with Dr. Smitty office to schedule radiation therapy. - Schedule follow-up appointment in 8 to 10 weeks after radiation.  Chemotherapy-induced skin rash, resolved Severe skin rash during chemotherapy resolved with lotion. - Advise to use the lotion if the rash recurs.  Chemotherapy-induced bone pain Significant bone pain managed with medication, expected to resolve post-chemotherapy. - Advise to use remaining pain medication as needed.   All questions were answered. The patient knows to call the clinic with any problems, questions or concerns.  I have spent a total of 30 minutes minutes of face-to-face and non-face-to-face time, preparing to see the patient, performing a medically appropriate examination, counseling and educating the patient, ordering medications/tests/procedures, rdocumenting clinical information in the electronic health record, independently interpreting results and communicating results to the patient, and care coordination.

## 2024-07-28 ENCOUNTER — Inpatient Hospital Stay

## 2024-07-28 VITALS — BP 94/73 | HR 65 | Temp 98.6°F | Resp 18

## 2024-07-28 DIAGNOSIS — Z79630 Long term (current) use of alkylating agent: Secondary | ICD-10-CM | POA: Diagnosis not present

## 2024-07-28 DIAGNOSIS — Z5111 Encounter for antineoplastic chemotherapy: Secondary | ICD-10-CM | POA: Diagnosis not present

## 2024-07-28 DIAGNOSIS — C50212 Malignant neoplasm of upper-inner quadrant of left female breast: Secondary | ICD-10-CM

## 2024-07-28 DIAGNOSIS — Z79633 Long term (current) use of mitotic inhibitor: Secondary | ICD-10-CM | POA: Diagnosis not present

## 2024-07-28 DIAGNOSIS — Z170 Estrogen receptor positive status [ER+]: Secondary | ICD-10-CM | POA: Diagnosis not present

## 2024-07-28 DIAGNOSIS — Z5189 Encounter for other specified aftercare: Secondary | ICD-10-CM | POA: Diagnosis not present

## 2024-07-28 MED ORDER — PEGFILGRASTIM-CBQV 6 MG/0.6ML ~~LOC~~ SOSY
6.0000 mg | PREFILLED_SYRINGE | Freq: Once | SUBCUTANEOUS | Status: AC
  Administered 2024-07-28: 6 mg via SUBCUTANEOUS
  Filled 2024-07-28: qty 0.6

## 2024-07-31 ENCOUNTER — Encounter: Payer: Self-pay | Admitting: *Deleted

## 2024-08-07 ENCOUNTER — Other Ambulatory Visit: Payer: Self-pay | Admitting: Family

## 2024-08-07 ENCOUNTER — Other Ambulatory Visit: Payer: Self-pay | Admitting: Cardiology

## 2024-08-07 DIAGNOSIS — J4540 Moderate persistent asthma, uncomplicated: Secondary | ICD-10-CM

## 2024-08-15 NOTE — Progress Notes (Signed)
 Mrs. Walthall has had a left breast lumpectomy 02/28/2024, Left breast re-excision of margins with SLN biopsy 04/08/2024 and has completed chemotherapy.  She is here to discuss proceeding with radiation therapy.  Histology per Pathology Report: grade 2, Papillary carcinoma with invasion, DCIS.    Receptor Status: ER(positive), PR (positive), Her2-neu (negative), Ki-(20%)   Skin Healing: Yes, some numbness under left arm at area of lumpectomy   Lymphedema issues, if any:   None   Pain issues, if any:  None   SAFETY ISSUES: Prior radiation? No Pacemaker/ICD? No Possible current pregnancy? Hysterectomy Is the patient on methotrexate? No  Current Complaints / other details:  Patient feels like she has a cold/congestion at this time- has an appointment today to get this evaluated

## 2024-08-17 NOTE — Progress Notes (Signed)
 Radiation Oncology         [phone] ________________________________   Outpatient Re Consultation - Conducted via telephone at patient request.  I spoke with the patient to conduct this consult visit via telephone. The patient was notified in advance and was offered an in person or telemedicine meeting to allow for face to face communication but instead preferred to proceed with a telephone visit.  Name: Janet [name]        MRN: [id number]  Date of Service: 08/20/2024 DOB: 1966/06/24  RR:Rmjwqnmi, Bascom, NP  Iruku, Praveena, MD     REFERRING PHYSICIAN: Loretha Ash, MD   DIAGNOSIS: The encounter diagnosis was Malignant neoplasm of upper-inner quadrant of left breast in female, estrogen receptor positive (HCC).   HISTORY OF PRESENT ILLNESS: Janet [name] is a 58 y.o. female with a diagnosis of left breast cancer.  The patient had a palpable and tender lump that she noted in her left breast and presented in November 2024 for mammography.  Her diagnostic workup indicated 2-3 oval masses in total measuring 4.2 cm and 1-2 masses just medial to this.  By ultrasound on 10/11/2023, in the 6 o'clock position of the left breast there was a lobulated elongated mass measuring 4.2 cm in greatest dimension and in the 8 o'clock position a second area measuring 1.1 cm.  In the 9 o'clock position 1/3 area was identified measuring up to 1.3 cm and a fourth area in the 10 o'clock position showed 2 adjacent hypoechoic masses together measuring 1 cm.  Multiple normal-appearing lymph nodes were seen in her left axilla without concern for any suspicious nodes.  She was counseled on biopsies and on 10/16/2023 the 6:00 lesion was biopsied showing intraductal papilloma with usual ductal hyperplasia.  The 8:00 lesion biopsy showed fibroadenomatoid changes negative for malignancy, and the 10 o'clock position usual breast tissue with fibrocystic change including fibrosis cystic changes were noted.  She was  counseled on the rationale to consider surgical resection of her papillomata's and fibrocystic disease and met with Dr. Vanderbilt to discuss this.  She opted to proceed on 02/28/24 with a left breast lumpectomy.  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  Her margins were positive posteriorly and medially for invasive disease and 1 [name] to the superior and anterior margin for DCIS.  Her tumor was ER/PR positive, HER2 negative with a Ki-67 of 20%.    Since her last visit, she underwent reexcision of her margins and sentinel lymph node biopsy on 04/08/2024.  This showed benign breast tissue in the medial, lateral, and posterior excisional specimens.  The anterior margin reexcision showed intraductal papillomas and measurement from 1 to 3 [name].  The inferior margin was benign, but the superior margin showed focal residual invasive ductal carcinoma with papillary features measuring 3 [name] with multifocal intraductal papillomas measuring up to 2 [name].  The new margin was 1.8 [name].  She had 5 sentinel lymph nodes sampled and 2 of which showed metastatic disease.  She was counseled on adjuvant chemotherapy which she began on 77/2/25.  She completed this regimen on 07/25/2024.  She is seen to consider adjuvant radiotherapy to the left breast and regional lymph nodes.    PREVIOUS RADIATION THERAPY: No   PAST MEDICAL HISTORY:  Past Medical History:  Diagnosis Date   Allergy    Anxiety    Arthritis    Asthma    Breast cancer (HCC) 02/28/2024  CHF (congestive heart failure) (HCC)    Congestive heart failure (CHF) (HCC)    Depression    Elevated brain natriuretic peptide (BNP) level    Hypertension    Lymphedema    Nonischemic cardiomyopathy (HCC) 03/2021   Pre-diabetes    SOB (shortness of breath)    Substance abuse (HCC)        PAST SURGICAL HISTORY: Past Surgical History:  Procedure Laterality Date   ABDOMINAL  HYSTERECTOMY     AXILLARY SENTINEL NODE BIOPSY  04/08/2024   Procedure: BIOPSY, LYMPH NODE, SENTINEL, AXILLARY;  Surgeon: Vanderbilt Ned, MD;  Location: [facility] OR;  Service: General;;  LEFT SENTINEL LYMPH NODE MAPPING AND BIOPSY X 3   BREAST BIOPSY Right    BREAST BIOPSY Left 10/16/2023   times 3   BREAST BIOPSY Left 10/16/2023   US  LT BREAST BX W LOC DEV EA ADD LESION IMG BX SPEC US  GUIDE 10/16/2023 [facility]   BREAST BIOPSY Left 10/16/2023   US  LT BREAST BX W LOC DEV 1ST LESION IMG BX SPEC US  GUIDE 10/16/2023 [facility]   BREAST BIOPSY Left 10/16/2023   US  LT BREAST BX W LOC DEV EA ADD LESION IMG BX SPEC US  GUIDE 10/16/2023 [facility]   BREAST BIOPSY Left 02/27/2024   US  LT RADIOACTIVE SEED LOC 02/27/2024 [facility]   BREAST BIOPSY Left 02/27/2024   US  LT RADIOACTIVE SEED EA ADD LESION 02/27/2024 [facility]   BREAST LUMPECTOMY Left 04/08/2024   Procedure: BREAST LUMPECTOMY;  Surgeon: Vanderbilt Ned, MD;  Location: [facility];  Service: General;  Laterality: Left;  LEFT BREAST RE-EXCISION LUMPECTOMY, LEFT SENTINEL LYMPH NODE MAPPING   BREAST LUMPECTOMY WITH RADIOACTIVE SEED LOCALIZATION Left 02/28/2024   Procedure: LEFT BREAST SEED LUMPECTOMY;  Surgeon: Vanderbilt Ned, MD;  Location: [facility];  Service: General;  Laterality: Left;  3 seeds   CESAREAN SECTION     COLONOSCOPY WITH PROPOFOL  N/A 12/10/2023   Procedure: COLONOSCOPY WITH PROPOFOL ;  Surgeon: Leigh Elspeth SQUIBB, MD;  Location: [facility];  Service: Gastroenterology;  Laterality: N/A;   HEMOSTASIS CLIP PLACEMENT  12/10/2023   Procedure: HEMOSTASIS CLIP PLACEMENT;  Surgeon: Leigh Elspeth SQUIBB, MD;  Location: [facility];  Service: Gastroenterology;;   HERNIA REPAIR     umbilical as a child   POLYPECTOMY  12/10/2023   Procedure: POLYPECTOMY;  Surgeon: Leigh Elspeth SQUIBB, MD;  Location: [facility];  Service: Gastroenterology;;   RIGHT/LEFT HEART CATH AND CORONARY ANGIOGRAPHY N/A 04/01/2021   Procedure:  RIGHT/LEFT HEART CATH AND CORONARY ANGIOGRAPHY;  Surgeon: Dann Candyce RAMAN, MD;  Location: [facility] INVASIVE CV LAB;  Service: Cardiovascular;  Laterality: N/A;   TUBAL LIGATION       FAMILY HISTORY:  Family History  Problem Relation Age of Onset   Colon polyps Mother    Hypertension Mother    Stroke Mother    Deep vein thrombosis Mother    Congestive Heart Failure Mother    Arthritis Mother    Asthma Mother    Depression Mother    Other Father        history unknown   Obesity Sister    Diabetes Sister    Obesity Brother    Lung cancer Maternal Uncle        smoked   Congestive Heart Failure Maternal Grandmother    Throat cancer Maternal Grandfather        smoked   Colon cancer Neg Hx    Esophageal cancer Neg Hx    Rectal cancer Neg Hx  Stomach cancer Neg Hx      SOCIAL HISTORY:  reports that she has never smoked. She has been exposed to tobacco smoke. She has never used smokeless tobacco. She reports current alcohol use of about 3.0 standard drinks of alcohol per week. She reports current drug use. Frequency: 1.00 time per week. Drugs: Marijuana and Cocaine.  And other notes it indicates that the patient drinks 1 pint of liquor per week and she has not used crack cocaine in approximately 6 months time.  The patient is single and lives in [place].  She lives with her daughter and two young granddaughters.    ALLERGIES: Clarithromycin, Penicillins, and Penicillin g   MEDICATIONS:  Current Outpatient Medications  Medication Sig Dispense Refill   albuterol  (PROVENTIL ) (2.5 MG/3ML) 0.083% nebulizer solution Take 3 mLs (2.5 mg total) by nebulization every 4 (four) hours as needed for wheezing or shortness of breath. 75 mL 2   albuterol  (VENTOLIN  HFA) 108 (90 Base) MCG/ACT inhaler Inhale 2 puffs into the lungs every 6 (six) hours as needed for wheezing or shortness of breath. 18 g 2   Budeson-Glycopyrrol-Formoterol  (BREZTRI  AEROSPHERE) 160-9-4.8 MCG/ACT AERO Inhale 2 puffs  into the lungs in the morning and at bedtime. 10.7 g 5   carvedilol  (COREG ) 12.5 MG tablet Take 1 tablet (12.5 mg total) by mouth 2 (two) times daily with a meal. 60 tablet 11   dapagliflozin  propanediol (FARXIGA ) 10 MG TABS tablet Take 1 tablet (10 mg total) by mouth daily before breakfast. 30 tablet 5   dexamethasone  (DECADRON ) 4 MG tablet Take 2 tabs by mouth 2 times daily starting day before chemo. Then take 2 tabs daily for 2 days starting day after chemo. Take with food. 30 tablet 1   DULoxetine (CYMBALTA) 30 MG capsule Take 30 mg by mouth daily.     fluticasone  (FLONASE ) 50 MCG/ACT nasal spray Place 2 sprays into both nostrils daily. 48 g 0   furosemide  (LASIX ) 20 MG tablet TAKE ONE TABLET (20 MG) BY MOUTH DAILY AT 9AM 30 tablet 11   ibuprofen (ADVIL) 800 MG tablet Take 800 mg by mouth every 6 (six) hours as needed for fever, headache or mild pain (pain score 1-3).     montelukast  (SINGULAIR ) 10 MG tablet TAKE ONE TABLET (10 MG) BY MOUTH DAILY AT 9PM AT BEDTIME 90 tablet 0   Multiple Vitamin (MULTIVITAMIN) tablet Take 1 tablet by mouth daily.     nystatin  (MYCOSTATIN /NYSTOP ) powder Apply 1 Application topically 3 (three) times daily. 15 g 0   oxyCODONE  (OXY IR/ROXICODONE ) 5 MG immediate release tablet Take 1 tablet (5 mg total) by mouth every 6 (six) hours as needed for severe pain (pain score 7-10). 6 tablet 0   pantoprazole  (PROTONIX ) 20 MG tablet Take 1 tablet (20 mg total) by mouth daily. 30 tablet 0   sacubitril -valsartan  (ENTRESTO ) 97-103 MG Take 1 tablet by mouth 2 (two) times daily. 60 tablet 3   spironolactone  (ALDACTONE ) 25 MG tablet TAKE ONE TABLET (25 MG) BY MOUTH DAILY 90 tablet 11   traZODone  (DESYREL ) 150 MG tablet Take 150 mg by mouth at bedtime.     triamcinolone  cream (KENALOG ) 0.1 % Apply 1 Application topically 2 (two) times daily. 60 g 0   No current facility-administered medications for this visit.     REVIEW OF SYSTEMS: On review of systems, the patient reports  that she is doing well and hopeful for a good report from her upcoming surgery. She notes a history of CHF  and prior to that has navigated lower extremity lymphedema for years. No other complaints are verbalized.      PHYSICAL EXAM:  Wt Readings from Last 3 Encounters:  07/25/24 266 lb 3.2 oz (120.7 kg)  07/17/24 264 lb 3.2 oz (119.8 kg)  07/14/24 265 lb 4 oz (120.3 kg)   Temp Readings from Last 3 Encounters:  07/28/24 98.6 F (37 C) (Oral)  07/25/24 98.5 F (36.9 C) (Temporal)  07/07/24 99.1 F (37.3 C) (Oral)   BP Readings from Last 3 Encounters:  07/28/24 94/73  07/25/24 101/69  07/17/24 94/60   Pulse Readings from Last 3 Encounters:  07/28/24 65  07/25/24 86  07/17/24 73    In general this is a well appearing African-American female in no acute distress. She's alert and oriented x4 and appropriate throughout the examination. Cardiopulmonary assessment is negative for acute distress and she exhibits normal effort. Bilateral breast exam is deferred.    ECOG = 1  0 - Asymptomatic (Fully active, able to carry on all predisease activities without restriction)  1 - Symptomatic but completely ambulatory (Restricted in physically strenuous activity but ambulatory and able to carry out work of a light or sedentary nature. For example, light housework, office work)  2 - Symptomatic, <50% in bed during the day (Ambulatory and capable of all self care but unable to carry out any work activities. Up and about more than 50% of waking hours)  3 - Symptomatic, >50% in bed, but not bedbound (Capable of only limited self-care, confined to bed or chair 50% or more of waking hours)  4 - Bedbound (Completely disabled. Cannot carry on any self-care. Totally confined to bed or chair)  5 - Death   Janet [name], Janet [name], Janet [name], et al. [phone]). Toxicity and response criteria of the [facility]. Am. Janet Bridges. Oncol. 5 (6): 649-55    LABORATORY DATA:  Lab Results   Component Value Date   WBC 10.9 (H) 07/25/2024   HGB 12.1 07/25/2024   HCT 36.7 07/25/2024   MCV 83.4 07/25/2024   PLT 338 07/25/2024   Lab Results  Component Value Date   NA 139 07/25/2024   K 4.2 07/25/2024   CL 107 07/25/2024   CO2 27 07/25/2024   Lab Results  Component Value Date   ALT 24 07/25/2024   AST 20 07/25/2024   ALKPHOS 58 07/25/2024   BILITOT 0.4 07/25/2024      RADIOGRAPHY: No results found.      IMPRESSION/PLAN: 1.  Stage IB, pT3N1M0, grade 2, ER/PR positive invasive papillary carcinoma of the left breast. Dr. Dewey has reviewed her final pathology.  She has healed well from her surgeries, and has completed systemic chemotherapy given her node positive disease.  Dr. Dewey would recommend external radiotherapy to the breast  to reduce risks of local recurrence. Dr. Loretha anticipates adjuvant antiestrogen therapy to follow. We discussed the risks, benefits, short, and long term effects of radiotherapy, as well as the curative intent, and the patient is interested in proceeding.  I reviewed the delivery and logistics of radiotherapy and Dr. Dewey recommends 4 weeks of radiotherapy to the left breast with deep inspiration breath-hold technique.  She will come for simulation next week.  At that time she will sign written consent to proceed  This encounter was conducted via telephone.  The patient has provided two factor identification and has given verbal consent for this type of encounter and has been advised to only accept  a meeting of this type in a secure network environment. The time spent during this encounter was *** minutes including preparation, discussion, and coordination of the patient's care. The attendants for this meeting include *** and  Donald Estefana Husband  and [name].  During the encounter,  *** and Donald Estefana Husband were ***located at [facility] Radiation Oncology Department.  [name] was located at home.       Donald KYM Husband, [facility]    **Disclaimer: This note was dictated with voice recognition software. Similar sounding words can inadvertently be transcribed and this note may contain transcription errors which may not have been corrected upon publication of note.**

## 2024-08-20 ENCOUNTER — Ambulatory Visit
Admission: RE | Admit: 2024-08-20 | Discharge: 2024-08-20 | Attending: Radiation Oncology | Admitting: Radiation Oncology

## 2024-08-20 ENCOUNTER — Ambulatory Visit
Admission: RE | Admit: 2024-08-20 | Discharge: 2024-08-20 | Disposition: A | Discharge status: Home / Self Care | Source: Ambulatory Visit | Attending: Radiation Oncology | Admitting: Radiation Oncology

## 2024-08-20 DIAGNOSIS — Z03818 Encounter for observation for suspected exposure to other biological agents ruled out: Secondary | ICD-10-CM | POA: Diagnosis not present

## 2024-08-20 DIAGNOSIS — C50212 Malignant neoplasm of upper-inner quadrant of left female breast: Secondary | ICD-10-CM

## 2024-08-20 DIAGNOSIS — J069 Acute upper respiratory infection, unspecified: Secondary | ICD-10-CM | POA: Diagnosis not present

## 2024-08-20 DIAGNOSIS — R062 Wheezing: Secondary | ICD-10-CM | POA: Diagnosis not present

## 2024-08-22 ENCOUNTER — Other Ambulatory Visit: Payer: Self-pay

## 2024-08-22 ENCOUNTER — Ambulatory Visit
Admission: EM | Admit: 2024-08-22 | Discharge: 2024-08-22 | Disposition: A | Discharge status: Home / Self Care | Attending: Family Medicine | Admitting: Family Medicine

## 2024-08-22 DIAGNOSIS — H6992 Unspecified Eustachian tube disorder, left ear: Secondary | ICD-10-CM

## 2024-08-22 MED ORDER — FLUTICASONE PROPIONATE 50 MCG/ACT NA SUSP: 1.0000 | Freq: Every day | NASAL | 0 refills | Status: AC

## 2024-08-22 NOTE — ED Provider Notes (Signed)
 [facility]    CSN: [id number] Arrival date & time: 08/22/24  1815      History   Chief Complaint No chief complaint on file.   HPI Janet [name] is a 58 y.o. female presents for ear pain.  Patient reports 3 days of left ear pain with some intermittent dizziness.  States she is already on an antibiotic, azithromycin , for bronchitis via her PCP and that she started 2 days ago.  Denies any drainage from the ear, fevers.  She tried some over-the-counter eardrops without improvement.  No other concerns at this time  HPI  Past Medical History:  Diagnosis Date   Allergy    Anxiety    Arthritis    Asthma    Breast cancer (HCC) 02/28/2024   CHF (congestive heart failure) (HCC)    Congestive heart failure (CHF) (HCC)    Depression    Elevated brain natriuretic peptide (BNP) level    Hypertension    Lymphedema    Nonischemic cardiomyopathy (HCC) 03/2021   Pre-diabetes    SOB (shortness of breath)    Substance abuse [facility])     Patient Active Problem List   Diagnosis Date Noted   Genetic testing 05/26/2024   Malignant neoplasm of upper-inner quadrant of left breast in female, estrogen receptor positive (HCC) 03/11/2024   Colon cancer screening 12/10/2023   Benign neoplasm of colon 12/10/2023   Vitamin D  deficiency 09/04/2023   Prediabetes 08/03/2022   Acute systolic heart failure (HCC)    Essential hypertension 02/17/2021   Asthma    Lymphedema    Elevated brain natriuretic peptide (BNP) level    DOE (dyspnea on exertion) 07/28/2020   Morbid obesity (HCC) 07/28/2020    Past Surgical History:  Procedure Laterality Date   ABDOMINAL HYSTERECTOMY     AXILLARY SENTINEL NODE BIOPSY  04/08/2024   Procedure: BIOPSY, LYMPH NODE, SENTINEL, AXILLARY;  Surgeon: Vanderbilt Ned, MD;  Location: [facility];  Service: General;;  LEFT SENTINEL LYMPH NODE MAPPING AND BIOPSY X 3   BREAST BIOPSY Right    BREAST BIOPSY Left 10/16/2023   times 3   BREAST BIOPSY Left 10/16/2023    US  LT BREAST BX W LOC DEV EA ADD LESION IMG BX SPEC US  GUIDE 10/16/2023 [facility]   BREAST BIOPSY Left 10/16/2023   US  LT BREAST BX W LOC DEV 1ST LESION IMG BX SPEC US  GUIDE 10/16/2023 [facility]   BREAST BIOPSY Left 10/16/2023   US  LT BREAST BX W LOC DEV EA ADD LESION IMG BX SPEC US  GUIDE 10/16/2023 [facility]   BREAST BIOPSY Left 02/27/2024   US  LT RADIOACTIVE SEED LOC 02/27/2024 [facility]   BREAST BIOPSY Left 02/27/2024   US  LT RADIOACTIVE SEED EA ADD LESION 02/27/2024 [facility]   BREAST LUMPECTOMY Left 04/08/2024   Procedure: BREAST LUMPECTOMY;  Surgeon: Vanderbilt Ned, MD;  Location: [facility];  Service: General;  Laterality: Left;  LEFT BREAST RE-EXCISION LUMPECTOMY, LEFT SENTINEL LYMPH NODE MAPPING   BREAST LUMPECTOMY WITH RADIOACTIVE SEED LOCALIZATION Left 02/28/2024   Procedure: LEFT BREAST SEED LUMPECTOMY;  Surgeon: Vanderbilt Ned, MD;  Location: [facility];  Service: General;  Laterality: Left;  3 seeds   CESAREAN SECTION     COLONOSCOPY WITH PROPOFOL  N/A 12/10/2023   Procedure: COLONOSCOPY WITH PROPOFOL ;  Surgeon: Leigh Elspeth SQUIBB, MD;  Location: [facility];  Service: Gastroenterology;  Laterality: N/A;   HEMOSTASIS CLIP PLACEMENT  12/10/2023   Procedure: HEMOSTASIS CLIP PLACEMENT;  Surgeon: Leigh Elspeth SQUIBB, MD;  Location: [facility];  Service: Gastroenterology;;   HERNIA REPAIR     umbilical as a child   POLYPECTOMY  12/10/2023   Procedure: POLYPECTOMY;  Surgeon: Leigh Elspeth SQUIBB, MD;  Location: [facility];  Service: Gastroenterology;;   RIGHT/LEFT HEART CATH AND CORONARY ANGIOGRAPHY N/A 04/01/2021   Procedure: RIGHT/LEFT HEART CATH AND CORONARY ANGIOGRAPHY;  Surgeon: Dann Candyce RAMAN, MD;  Location: [facility] INVASIVE CV LAB;  Service: Cardiovascular;  Laterality: N/A;   TUBAL LIGATION      OB History   No obstetric history on file.      Home Medications    Prior to Admission medications   Medication Sig Start Date End Date  Taking? Authorizing Provider  azithromycin  (ZITHROMAX ) 250 MG tablet Take 250 mg by mouth as directed. 08/20/24  Yes [provider]  fluticasone  (FLONASE ) 50 MCG/ACT nasal spray Place 1 spray into both nostrils daily. 08/22/24  Yes Breeanna Galgano, Jodi R, NP  albuterol  (PROVENTIL ) (2.5 MG/3ML) 0.083% nebulizer solution Take 3 mLs (2.5 mg total) by nebulization every 4 (four) hours as needed for wheezing or shortness of breath. 01/26/24   Ward, Harlene PEDLAR, PA-C  albuterol  (VENTOLIN  HFA) 108 (90 Base) MCG/ACT inhaler Inhale 2 puffs into the lungs every 6 (six) hours as needed for wheezing or shortness of breath. 03/31/24   Hunsucker, Donnice SAUNDERS, MD  Budeson-Glycopyrrol-Formoterol  (BREZTRI  AEROSPHERE) 160-9-4.8 MCG/ACT AERO Inhale 2 puffs into the lungs in the morning and at bedtime. 12/17/23   Lorren Greig PARAS, NP  carvedilol  (COREG ) 12.5 MG tablet Take 1 tablet (12.5 mg total) by mouth 2 (two) times daily with a meal. 11/15/23   Sabharwal, Aditya, DO  dapagliflozin  propanediol (FARXIGA ) 10 MG TABS tablet Take 1 tablet (10 mg total) by mouth daily before breakfast. 03/31/24   Clegg, Amy D, NP  DULoxetine (CYMBALTA) 30 MG capsule Take 30 mg by mouth daily. 02/06/24   [provider]  furosemide  (LASIX ) 20 MG tablet TAKE ONE TABLET (20 MG) BY MOUTH DAILY AT 9AM 11/15/23   Sabharwal, Aditya, DO  ibuprofen (ADVIL) 800 MG tablet Take 800 mg by mouth every 6 (six) hours as needed for fever, headache or mild pain (pain score 1-3).    [provider]  melatonin 1 MG TABS tablet Take 1 mg by mouth at bedtime.    [provider]  montelukast  (SINGULAIR ) 10 MG tablet TAKE ONE TABLET (10 MG) BY MOUTH DAILY AT 9PM AT BEDTIME 04/15/24   Lorren Greig PARAS, NP  Multiple Vitamin (MULTIVITAMIN) tablet Take 1 tablet by mouth daily.    [provider]  sacubitril -valsartan  (ENTRESTO ) 97-103 MG Take 1 tablet by mouth 2 (two) times daily. 07/14/24   Sabharwal, Aditya, DO  spironolactone  (ALDACTONE ) 25  MG tablet TAKE ONE TABLET (25 MG) BY MOUTH DAILY 06/16/24   Sabharwal, Aditya, DO  traZODone  (DESYREL ) 150 MG tablet Take 150 mg by mouth at bedtime.    [provider]    Family History Family History  Problem Relation Age of Onset   Colon polyps Mother    Hypertension Mother    Stroke Mother    Deep vein thrombosis Mother    Congestive Heart Failure Mother    Arthritis Mother    Asthma Mother    Depression Mother    Other Father        history unknown   Obesity Sister    Diabetes Sister    Obesity Brother    Lung cancer Maternal Uncle  smoked   Congestive Heart Failure Maternal Grandmother    Throat cancer Maternal Grandfather        smoked   Colon cancer Neg Hx    Esophageal cancer Neg Hx    Rectal cancer Neg Hx    Stomach cancer Neg Hx     Social History Social History   Tobacco Use   Smoking status: Never    Passive exposure: Current   Smokeless tobacco: Never  Vaping Use   Vaping status: Never Used  Substance Use Topics   Alcohol use: Not Currently    Comment: occ   Drug use: Yes    Frequency: 1.0 times per week    Types: Marijuana    Comment: Pt has not sued cocaine in 6 months ( as of 04/07/24) twice a week     Allergies   Clarithromycin, Penicillins, and Penicillin g   Review of Systems Review of Systems  HENT:  Positive for ear pain.      Physical Exam Triage Vital Signs ED Triage Vitals  Encounter Vitals Group     BP 08/22/24 1823 111/77     Girls Systolic BP Percentile --      Girls Diastolic BP Percentile --      Boys Systolic BP Percentile --      Boys Diastolic BP Percentile --      Pulse Rate 08/22/24 1823 76     Resp 08/22/24 1823 20     Temp 08/22/24 1823 99.2 F (37.3 C)     Temp Source 08/22/24 1823 Oral     SpO2 08/22/24 1823 95 %     Weight --      Height --      Head Circumference --      Peak Flow --      Pain Score 08/22/24 1821 7     Pain Loc --      Pain Education --      Exclude from Growth  Chart --    No data found.  Updated Vital Signs BP 111/77   Pulse 76   Temp 99.2 F (37.3 C) (Oral)   Resp 20   SpO2 95%   Visual Acuity Right Eye Distance:   Left Eye Distance:   Bilateral Distance:    Right Eye Near:   Left Eye Near:    Bilateral Near:     Physical Exam Vitals and nursing note reviewed.  Constitutional:      General: She is not in acute distress.    Appearance: Normal appearance. She is not ill-appearing.  HENT:     Head: Normocephalic and atraumatic.     Right Ear: Tympanic membrane and ear canal normal.     Left Ear: Ear canal normal. A middle ear effusion is present.  Eyes:     Pupils: Pupils are equal, round, and reactive to light.  Cardiovascular:     Rate and Rhythm: Normal rate.  Pulmonary:     Effort: Pulmonary effort is normal.  Skin:    General: Skin is warm and dry.  Neurological:     General: No focal deficit present.     Mental Status: She is alert and oriented to person, place, and time.  Psychiatric:        Mood and Affect: Mood normal.        Behavior: Behavior normal.      UC Treatments / Results  Labs (all labs ordered are listed, but only abnormal results are displayed) Labs  Reviewed - No data to display  EKG   Radiology No results found.  Procedures Procedures (including critical care time)  Medications Ordered in UC Medications - No data to display  Initial Impression / Assessment and Plan / UC Course  I have reviewed the triage vital signs and the nursing notes.  Pertinent labs & imaging results that were available during my care of the patient were reviewed by me and considered in my medical decision making (see chart for details).     Reviewed exam and symptoms with patient.  No red flags.  Discussed eustachian tube dysfunction.  Start Flonase  and also recommended over-the-counter Claritin or Zyrtec daily.  PCP follow-up if symptoms do not improve.  ER precautions reviewed. Final Clinical  Impressions(s) / UC Diagnoses   Final diagnoses:  Dysfunction of left eustachian tube     Discharge Instructions      Start Flonase  daily.  I also recommend you start over-the-counter allergy medicine such as Claritin or Zyrtec daily.  Follow-up with your PCP if your symptoms do not improve.  Please go to the ER for any worsening symptoms.  Hope you feel better soon!    ED Prescriptions     Medication Sig Dispense Auth. Provider   fluticasone  (FLONASE ) 50 MCG/ACT nasal spray Place 1 spray into both nostrils daily. 15.8 mL Thelmer Legler, Jodi R, NP      PDMP not reviewed this encounter.   Loreda Myla SAUNDERS, NP 08/22/24 [phone]

## 2024-08-22 NOTE — ED Triage Notes (Signed)
 Pt states can't hear out of her left ear and has dizzinessx3d. Pt states she saw her dr Wednesday and was put on atx for bronchitis and the dr looked at her ear and said it was fine.

## 2024-08-22 NOTE — Discharge Instructions (Addendum)
 Start Flonase  daily.  I also recommend you start over-the-counter allergy medicine such as Claritin or Zyrtec daily.  Follow-up with your PCP if your symptoms do not improve.  Please go to the ER for any worsening symptoms.  Hope you feel better soon!

## 2024-08-26 ENCOUNTER — Ambulatory Visit
Admission: RE | Admit: 2024-08-26 | Discharge: 2024-08-26 | Disposition: A | Discharge status: Home / Self Care | Source: Ambulatory Visit | Attending: Radiation Oncology | Admitting: Radiation Oncology

## 2024-08-26 ENCOUNTER — Encounter: Payer: Self-pay | Admitting: Radiation Oncology

## 2024-08-26 DIAGNOSIS — Z170 Estrogen receptor positive status [ER+]: Secondary | ICD-10-CM | POA: Insufficient documentation

## 2024-08-26 DIAGNOSIS — Z510 Encounter for antineoplastic radiation therapy: Secondary | ICD-10-CM | POA: Insufficient documentation

## 2024-08-26 DIAGNOSIS — C50212 Malignant neoplasm of upper-inner quadrant of left female breast: Secondary | ICD-10-CM | POA: Diagnosis present

## 2024-09-02 ENCOUNTER — Encounter (HOSPITAL_COMMUNITY): Payer: Self-pay

## 2024-09-02 ENCOUNTER — Emergency Department (HOSPITAL_COMMUNITY)
Admission: EM | Admit: 2024-09-02 | Discharge: 2024-09-02 | Disposition: A | Discharge status: Home / Self Care | Attending: Emergency Medicine | Admitting: Emergency Medicine

## 2024-09-02 ENCOUNTER — Other Ambulatory Visit: Payer: Self-pay

## 2024-09-02 ENCOUNTER — Encounter: Payer: Self-pay | Admitting: *Deleted

## 2024-09-02 DIAGNOSIS — H65192 Other acute nonsuppurative otitis media, left ear: Secondary | ICD-10-CM | POA: Diagnosis not present

## 2024-09-02 DIAGNOSIS — H6592 Unspecified nonsuppurative otitis media, left ear: Secondary | ICD-10-CM

## 2024-09-02 DIAGNOSIS — C50212 Malignant neoplasm of upper-inner quadrant of left female breast: Secondary | ICD-10-CM

## 2024-09-02 DIAGNOSIS — H9202 Otalgia, left ear: Secondary | ICD-10-CM | POA: Diagnosis present

## 2024-09-02 MED ORDER — METHYLPREDNISOLONE 4 MG PO TBPK: ORAL_TABLET | ORAL | 0 refills | Status: DC

## 2024-09-02 MED ORDER — CEFDINIR 300 MG PO CAPS: 300.0000 mg | ORAL_CAPSULE | Freq: Two times a day (BID) | ORAL | 0 refills | Status: AC

## 2024-09-02 MED ORDER — METOCLOPRAMIDE HCL 10 MG PO TABS: 10.0000 mg | ORAL_TABLET | Freq: Four times a day (QID) | ORAL | 0 refills | Status: DC | PRN

## 2024-09-02 NOTE — ED Triage Notes (Signed)
 Pt reports she has been unable to hear out of her left ear x 3 weeks. Denies fevers.

## 2024-09-02 NOTE — ED Provider Notes (Signed)
 [place] EM[facility] AT [facility] Provider Note   CSN: [id number] Arrival date & time: 09/02/24  1356     Patient presents with: Hearing Problem   Janet [name] is a 58 y.o. female who presents emergency department with a chief complaint of left ear pain and and decreased hearing.  States that has been going on for 12 days.  She was seen by her PCP and an urgent care she was treated with azithromycin  and nasal spray.  She has persistent and now worsening pain.  She denies severe headache.  She has associated congestion to the left side of her face.  She is allergic to penicillin.   HPI     Prior to Admission medications   Medication Sig Start Date End Date Taking? Authorizing Provider  albuterol  (PROVENTIL ) (2.5 MG/3ML) 0.083% nebulizer solution Take 3 mLs (2.5 mg total) by nebulization every 4 (four) hours as needed for wheezing or shortness of breath. 01/26/24   Ward, Jessica Z, PA-C  albuterol  (VENTOLIN  HFA) 108 (90 Base) MCG/ACT inhaler Inhale 2 puffs into the lungs every 6 (six) hours as needed for wheezing or shortness of breath. 03/31/24   Hunsucker, Donnice SAUNDERS, MD  azithromycin  (ZITHROMAX ) 250 MG tablet Take 250 mg by mouth as directed. 08/20/24   [provider]  Budeson-Glycopyrrol-Formoterol  (BREZTRI  AEROSPHERE) 160-9-4.8 MCG/ACT AERO Inhale 2 puffs into the lungs in the morning and at bedtime. 12/17/23   Lorren Greig PARAS, NP  carvedilol  (COREG ) 12.5 MG tablet Take 1 tablet (12.5 mg total) by mouth 2 (two) times daily with a meal. 11/15/23   Sabharwal, Aditya, DO  dapagliflozin  propanediol (FARXIGA ) 10 MG TABS tablet Take 1 tablet (10 mg total) by mouth daily before breakfast. 03/31/24   Clegg, Amy D, NP  DULoxetine (CYMBALTA) 30 MG capsule Take 30 mg by mouth daily. 02/06/24   [provider]  fluticasone  (FLONASE ) 50 MCG/ACT nasal spray Place 1 spray into both nostrils daily. 08/22/24   Mayer, Jodi R, NP  furosemide  (LASIX ) 20 MG tablet  TAKE ONE TABLET (20 MG) BY MOUTH DAILY AT 9AM 11/15/23   Sabharwal, Aditya, DO  ibuprofen (ADVIL) 800 MG tablet Take 800 mg by mouth every 6 (six) hours as needed for fever, headache or mild pain (pain score 1-3).    [provider]  melatonin 1 MG TABS tablet Take 1 mg by mouth at bedtime.    [provider]  montelukast  (SINGULAIR ) 10 MG tablet TAKE ONE TABLET (10 MG) BY MOUTH DAILY AT 9PM AT BEDTIME 04/15/24   Lorren Greig PARAS, NP  Multiple Vitamin (MULTIVITAMIN) tablet Take 1 tablet by mouth daily.    [provider]  sacubitril -valsartan  (ENTRESTO ) 97-103 MG Take 1 tablet by mouth 2 (two) times daily. 07/14/24   Sabharwal, Aditya, DO  spironolactone  (ALDACTONE ) 25 MG tablet TAKE ONE TABLET (25 MG) BY MOUTH DAILY 06/16/24   Sabharwal, Aditya, DO  traZODone  (DESYREL ) 150 MG tablet Take 150 mg by mouth at bedtime.    [provider]    Allergies: Clarithromycin, Penicillins, and Penicillin g    Review of Systems  Updated Vital Signs BP 124/83 (BP Location: Right Arm)   Pulse 64   Temp 98.5 F (36.9 C)   Resp 18   Ht 5' 8 (1.727 m)   Wt 126.6 kg   SpO2 93%   BMI 42.42 kg/m   Physical Exam Vitals and nursing note reviewed.  Constitutional:      General: She is not  in acute distress.    Appearance: She is well-developed. She is not diaphoretic.  HENT:     Head: Normocephalic and atraumatic.     Comments: Left TM with large effusion, bulging TM, there is some left sided cervical adenopathy inferior to the ear.    Right Ear: Tympanic membrane and external ear normal.     Left Ear: External ear normal.     Nose: Nose normal.     Mouth/Throat:     Mouth: Mucous membranes are moist.  Eyes:     General: No scleral icterus.    Conjunctiva/sclera: Conjunctivae normal.  Cardiovascular:     Rate and Rhythm: Normal rate and regular rhythm.     Heart sounds: Normal heart sounds. No murmur heard.    No friction rub. No gallop.  Pulmonary:     Effort:  Pulmonary effort is normal. No respiratory distress.     Breath sounds: Normal breath sounds.  Abdominal:     General: Bowel sounds are normal. There is no distension.     Palpations: Abdomen is soft. There is no mass.     Tenderness: There is no abdominal tenderness. There is no guarding.  Musculoskeletal:     Cervical back: Normal range of motion.  Skin:    General: Skin is warm and dry.  Neurological:     Mental Status: She is alert and oriented to person, place, and time.  Psychiatric:        Behavior: Behavior normal.     (all labs ordered are listed, but only abnormal results are displayed) Labs Reviewed - No data to display  EKG: None  Radiology: No results found.   Procedures   Medications Ordered in the ED - No data to display                                  Medical Decision Making  .  Here with persistent ear fullness and discomfort.  She appears to have acute otitis media with effusion.  Will treat with cephalosporin and a round of steroids.  Follow-up with PCP.  No evidence of mastoiditis.  Her TM is intact.  She appears appropriate for discharge at this time.     Final diagnoses:  None    ED Discharge Orders     None          Arloa Chroman, PA-C 09/02/24 1422    Kammerer, Megan L, DO 09/03/24 0840

## 2024-09-02 NOTE — Progress Notes (Signed)
 Janet [name]                                          MRN: [id number]   09/02/2024   The [facility] Quality Team Specialist reviewed this patient medical record for the purposes of chart review for care gap closure. The following were reviewed: chart review for care gap closure-diabetic eye exam, glycemic status assessment, and kidney health evaluation for diabetes:eGFR  and uACR.    [facility] Quality Team

## 2024-09-05 ENCOUNTER — Other Ambulatory Visit: Payer: Self-pay | Admitting: Family

## 2024-09-05 DIAGNOSIS — J4540 Moderate persistent asthma, uncomplicated: Secondary | ICD-10-CM

## 2024-09-09 ENCOUNTER — Ambulatory Visit: Admitting: Radiation Oncology

## 2024-09-09 ENCOUNTER — Inpatient Hospital Stay: Attending: Hematology and Oncology

## 2024-09-09 DIAGNOSIS — Z510 Encounter for antineoplastic radiation therapy: Secondary | ICD-10-CM | POA: Diagnosis not present

## 2024-09-10 ENCOUNTER — Other Ambulatory Visit: Payer: Self-pay

## 2024-09-10 ENCOUNTER — Ambulatory Visit
Admission: RE | Admit: 2024-09-10 | Discharge: 2024-09-10 | Disposition: A | Discharge status: Home / Self Care | Source: Ambulatory Visit | Attending: Radiation Oncology | Admitting: Radiation Oncology

## 2024-09-10 ENCOUNTER — Inpatient Hospital Stay

## 2024-09-10 DIAGNOSIS — Z510 Encounter for antineoplastic radiation therapy: Secondary | ICD-10-CM | POA: Diagnosis not present

## 2024-09-10 LAB — RAD ONC ARIA SESSION SUMMARY

## 2024-09-11 ENCOUNTER — Other Ambulatory Visit: Payer: Self-pay

## 2024-09-11 ENCOUNTER — Ambulatory Visit
Admission: RE | Admit: 2024-09-11 | Discharge: 2024-09-11 | Disposition: A | Source: Ambulatory Visit | Attending: Radiation Oncology

## 2024-09-11 ENCOUNTER — Inpatient Hospital Stay

## 2024-09-11 DIAGNOSIS — Z510 Encounter for antineoplastic radiation therapy: Secondary | ICD-10-CM | POA: Diagnosis not present

## 2024-09-11 LAB — RAD ONC ARIA SESSION SUMMARY

## 2024-09-12 ENCOUNTER — Ambulatory Visit
Admission: RE | Admit: 2024-09-12 | Discharge: 2024-09-12 | Disposition: A | Discharge status: Home / Self Care | Source: Ambulatory Visit | Attending: Radiation Oncology | Admitting: Radiation Oncology

## 2024-09-12 ENCOUNTER — Other Ambulatory Visit: Payer: Self-pay

## 2024-09-12 DIAGNOSIS — Z510 Encounter for antineoplastic radiation therapy: Secondary | ICD-10-CM | POA: Diagnosis not present

## 2024-09-12 DIAGNOSIS — Z170 Estrogen receptor positive status [ER+]: Secondary | ICD-10-CM

## 2024-09-12 LAB — RAD ONC ARIA SESSION SUMMARY

## 2024-09-12 MED ORDER — RADIAPLEXRX EX GEL: Freq: Once | CUTANEOUS | Status: AC

## 2024-09-12 MED ORDER — ALRA NON-METALLIC DEODORANT (RAD-ONC)
1.0000 | Freq: Once | TOPICAL | Status: AC
  Administered 2024-09-12: 1 via TOPICAL

## 2024-09-15 ENCOUNTER — Inpatient Hospital Stay

## 2024-09-15 ENCOUNTER — Other Ambulatory Visit: Payer: Self-pay

## 2024-09-15 ENCOUNTER — Ambulatory Visit
Admission: RE | Admit: 2024-09-15 | Discharge: 2024-09-15 | Disposition: A | Discharge status: Home / Self Care | Source: Ambulatory Visit | Attending: Radiation Oncology | Admitting: Radiation Oncology

## 2024-09-15 DIAGNOSIS — Z510 Encounter for antineoplastic radiation therapy: Secondary | ICD-10-CM | POA: Diagnosis not present

## 2024-09-15 LAB — RAD ONC ARIA SESSION SUMMARY

## 2024-09-16 ENCOUNTER — Ambulatory Visit

## 2024-09-16 ENCOUNTER — Inpatient Hospital Stay

## 2024-09-17 ENCOUNTER — Ambulatory Visit
Admission: RE | Admit: 2024-09-17 | Discharge: 2024-09-17 | Disposition: A | Discharge status: Home / Self Care | Source: Ambulatory Visit | Attending: Radiation Oncology | Admitting: Radiation Oncology

## 2024-09-17 ENCOUNTER — Inpatient Hospital Stay

## 2024-09-17 ENCOUNTER — Other Ambulatory Visit: Payer: Self-pay

## 2024-09-17 DIAGNOSIS — Z510 Encounter for antineoplastic radiation therapy: Secondary | ICD-10-CM | POA: Diagnosis not present

## 2024-09-17 LAB — RAD ONC ARIA SESSION SUMMARY
Course Elapsed Days: 7
Plan Fractions Treated to Date: 5
Plan Fractions Treated to Date: 5
Plan Prescribed Dose Per Fraction: 1.8 Gy
Plan Prescribed Dose Per Fraction: 1.8 Gy
Plan Total Fractions Prescribed: 28
Plan Total Fractions Prescribed: 28
Plan Total Prescribed Dose: 50.4 Gy
Plan Total Prescribed Dose: 50.4 Gy
Reference Point Dosage Given to Date: 9 Gy
Reference Point Dosage Given to Date: 9 Gy
Reference Point Session Dosage Given: 1.8 Gy
Reference Point Session Dosage Given: 1.8 Gy
Session Number: 5

## 2024-09-18 ENCOUNTER — Inpatient Hospital Stay

## 2024-09-18 ENCOUNTER — Ambulatory Visit

## 2024-09-18 ENCOUNTER — Telehealth: Payer: Self-pay | Admitting: Radiation Oncology

## 2024-09-18 NOTE — Telephone Encounter (Signed)
 Pt called advising she would be missing today's treatment appt. She stated she is not feeling well and is experiencing shortness of breath; she will be reaching out to her PCP for an appt today. Message sent to treatment team via email.

## 2024-09-19 ENCOUNTER — Other Ambulatory Visit: Payer: Self-pay

## 2024-09-19 ENCOUNTER — Ambulatory Visit
Admission: RE | Admit: 2024-09-19 | Discharge: 2024-09-19 | Disposition: A | Discharge status: Home / Self Care | Source: Ambulatory Visit | Attending: Radiation Oncology | Admitting: Radiation Oncology

## 2024-09-19 ENCOUNTER — Inpatient Hospital Stay

## 2024-09-19 DIAGNOSIS — Z510 Encounter for antineoplastic radiation therapy: Secondary | ICD-10-CM | POA: Diagnosis not present

## 2024-09-19 LAB — RAD ONC ARIA SESSION SUMMARY
Course Elapsed Days: 9
Plan Fractions Treated to Date: 6
Plan Fractions Treated to Date: 6
Plan Prescribed Dose Per Fraction: 1.8 Gy
Plan Prescribed Dose Per Fraction: 1.8 Gy
Plan Total Fractions Prescribed: 28
Plan Total Fractions Prescribed: 28
Plan Total Prescribed Dose: 50.4 Gy
Plan Total Prescribed Dose: 50.4 Gy
Reference Point Dosage Given to Date: 10.8 Gy
Reference Point Dosage Given to Date: 10.8 Gy
Reference Point Session Dosage Given: 1.8 Gy
Reference Point Session Dosage Given: 1.8 Gy
Session Number: 6

## 2024-09-22 ENCOUNTER — Ambulatory Visit
Admission: RE | Admit: 2024-09-22 | Discharge: 2024-09-22 | Disposition: A | Discharge status: Home / Self Care | Source: Ambulatory Visit | Attending: Radiation Oncology | Admitting: Radiation Oncology

## 2024-09-22 ENCOUNTER — Other Ambulatory Visit: Payer: Self-pay | Admitting: Family

## 2024-09-22 ENCOUNTER — Other Ambulatory Visit: Payer: Self-pay

## 2024-09-22 DIAGNOSIS — Z801 Family history of malignant neoplasm of trachea, bronchus and lung: Secondary | ICD-10-CM | POA: Insufficient documentation

## 2024-09-22 DIAGNOSIS — T451X5A Adverse effect of antineoplastic and immunosuppressive drugs, initial encounter: Secondary | ICD-10-CM | POA: Insufficient documentation

## 2024-09-22 DIAGNOSIS — C50212 Malignant neoplasm of upper-inner quadrant of left female breast: Secondary | ICD-10-CM | POA: Insufficient documentation

## 2024-09-22 DIAGNOSIS — Z808 Family history of malignant neoplasm of other organs or systems: Secondary | ICD-10-CM | POA: Insufficient documentation

## 2024-09-22 DIAGNOSIS — J4540 Moderate persistent asthma, uncomplicated: Secondary | ICD-10-CM

## 2024-09-22 DIAGNOSIS — G620 Drug-induced polyneuropathy: Secondary | ICD-10-CM | POA: Insufficient documentation

## 2024-09-22 DIAGNOSIS — Z170 Estrogen receptor positive status [ER+]: Secondary | ICD-10-CM | POA: Insufficient documentation

## 2024-09-22 DIAGNOSIS — N63 Unspecified lump in unspecified breast: Secondary | ICD-10-CM | POA: Insufficient documentation

## 2024-09-22 DIAGNOSIS — Z510 Encounter for antineoplastic radiation therapy: Secondary | ICD-10-CM | POA: Insufficient documentation

## 2024-09-22 DIAGNOSIS — J209 Acute bronchitis, unspecified: Secondary | ICD-10-CM | POA: Insufficient documentation

## 2024-09-22 LAB — RAD ONC ARIA SESSION SUMMARY
Course Elapsed Days: 12
Plan Fractions Treated to Date: 7
Plan Fractions Treated to Date: 7
Plan Prescribed Dose Per Fraction: 1.8 Gy
Plan Prescribed Dose Per Fraction: 1.8 Gy
Plan Total Fractions Prescribed: 28
Plan Total Fractions Prescribed: 28
Plan Total Prescribed Dose: 50.4 Gy
Plan Total Prescribed Dose: 50.4 Gy
Reference Point Dosage Given to Date: 12.6 Gy
Reference Point Dosage Given to Date: 12.6 Gy
Reference Point Session Dosage Given: 1.8 Gy
Reference Point Session Dosage Given: 1.8 Gy
Session Number: 7

## 2024-09-22 NOTE — Telephone Encounter (Unsigned)
 Copied from CRM #[id number]. Topic: Clinical - Medication Refill >> Sep 22, 2024  5:54 PM Lauren C wrote: Medication:  montelukast  (SINGULAIR ) 10 MG tablet  Has the patient contacted their pharmacy? Yes The pharmacy is the one reaching out   This is the patient's preferred pharmacy:  [facility] [place] [place] [address] [place], [place] [address] Phone: [phone]  Fax: [phone]    Is this the correct pharmacy for this prescription? Yes If no, delete pharmacy and type the correct one.   Has the prescription been filled recently? Yes  Is the patient out of the medication? Unknown  Has the patient been seen for an appointment in the last year OR does the patient have an upcoming appointment? Yes  Can we respond through MyChart? Yes  Agent: Please be advised that Rx refills may take up to 3 business days. We ask that you follow-up with your pharmacy.

## 2024-09-23 ENCOUNTER — Inpatient Hospital Stay: Attending: Hematology and Oncology

## 2024-09-23 ENCOUNTER — Other Ambulatory Visit: Payer: Self-pay

## 2024-09-23 ENCOUNTER — Ambulatory Visit
Admission: RE | Admit: 2024-09-23 | Discharge: 2024-09-23 | Disposition: A | Source: Ambulatory Visit | Attending: Radiation Oncology

## 2024-09-23 DIAGNOSIS — Z801 Family history of malignant neoplasm of trachea, bronchus and lung: Secondary | ICD-10-CM | POA: Insufficient documentation

## 2024-09-23 DIAGNOSIS — C50212 Malignant neoplasm of upper-inner quadrant of left female breast: Secondary | ICD-10-CM | POA: Insufficient documentation

## 2024-09-23 DIAGNOSIS — Z808 Family history of malignant neoplasm of other organs or systems: Secondary | ICD-10-CM | POA: Insufficient documentation

## 2024-09-23 DIAGNOSIS — Z510 Encounter for antineoplastic radiation therapy: Secondary | ICD-10-CM | POA: Diagnosis not present

## 2024-09-23 DIAGNOSIS — J209 Acute bronchitis, unspecified: Secondary | ICD-10-CM | POA: Insufficient documentation

## 2024-09-23 DIAGNOSIS — G620 Drug-induced polyneuropathy: Secondary | ICD-10-CM | POA: Insufficient documentation

## 2024-09-23 DIAGNOSIS — T451X5A Adverse effect of antineoplastic and immunosuppressive drugs, initial encounter: Secondary | ICD-10-CM | POA: Insufficient documentation

## 2024-09-23 DIAGNOSIS — N63 Unspecified lump in unspecified breast: Secondary | ICD-10-CM | POA: Insufficient documentation

## 2024-09-23 LAB — RAD ONC ARIA SESSION SUMMARY
Course Elapsed Days: 13
Plan Fractions Treated to Date: 8
Plan Fractions Treated to Date: 8
Plan Prescribed Dose Per Fraction: 1.8 Gy
Plan Prescribed Dose Per Fraction: 1.8 Gy
Plan Total Fractions Prescribed: 28
Plan Total Fractions Prescribed: 28
Plan Total Prescribed Dose: 50.4 Gy
Plan Total Prescribed Dose: 50.4 Gy
Reference Point Dosage Given to Date: 14.4 Gy
Reference Point Dosage Given to Date: 14.4 Gy
Reference Point Session Dosage Given: 1.8 Gy
Reference Point Session Dosage Given: 1.8 Gy
Session Number: 8

## 2024-09-24 ENCOUNTER — Other Ambulatory Visit: Payer: Self-pay

## 2024-09-24 ENCOUNTER — Inpatient Hospital Stay

## 2024-09-24 ENCOUNTER — Ambulatory Visit
Admission: RE | Admit: 2024-09-24 | Discharge: 2024-09-24 | Disposition: A | Source: Ambulatory Visit | Attending: Radiation Oncology

## 2024-09-24 DIAGNOSIS — Z510 Encounter for antineoplastic radiation therapy: Secondary | ICD-10-CM | POA: Diagnosis not present

## 2024-09-24 LAB — RAD ONC ARIA SESSION SUMMARY
Course Elapsed Days: 14
Plan Fractions Treated to Date: 9
Plan Fractions Treated to Date: 9
Plan Prescribed Dose Per Fraction: 1.8 Gy
Plan Prescribed Dose Per Fraction: 1.8 Gy
Plan Total Fractions Prescribed: 28
Plan Total Fractions Prescribed: 28
Plan Total Prescribed Dose: 50.4 Gy
Plan Total Prescribed Dose: 50.4 Gy
Reference Point Dosage Given to Date: 16.2 Gy
Reference Point Dosage Given to Date: 16.2 Gy
Reference Point Session Dosage Given: 1.8 Gy
Reference Point Session Dosage Given: 1.8 Gy
Session Number: 9

## 2024-09-24 MED ORDER — MONTELUKAST SODIUM 10 MG PO TABS: 10.0000 mg | ORAL_TABLET | Freq: Every day | ORAL | 0 refills | Status: AC

## 2024-09-24 NOTE — Telephone Encounter (Signed)
 Requested Prescriptions  Pending Prescriptions Disp Refills   montelukast  (SINGULAIR ) 10 MG tablet 90 tablet 0    Sig: Take 1 tablet (10 mg total) by mouth at bedtime.     Pulmonology:  Leukotriene Inhibitors Passed - 09/24/2024 10:43 AM      Passed - Valid encounter within last 12 months    Recent Outpatient Visits           9 months ago Hospital discharge follow-up   [facility], Amy J, NP   11 months ago Moderate persistent asthma with acute exacerbation   [place] [facility], MD   1 year ago Encounter for screening mammogram for malignant neoplasm of breast   [place] [facility], [place], NP   1 year ago Annual physical exam   [place] [facility], Amy J, NP   2 years ago Lipoma of right upper extremity    [facility] at [facility], Greig PARAS, NP

## 2024-09-25 ENCOUNTER — Inpatient Hospital Stay

## 2024-09-25 ENCOUNTER — Ambulatory Visit
Admission: RE | Admit: 2024-09-25 | Discharge: 2024-09-25 | Disposition: A | Discharge status: Home / Self Care | Source: Ambulatory Visit | Attending: Radiation Oncology | Admitting: Radiation Oncology

## 2024-09-25 ENCOUNTER — Other Ambulatory Visit: Payer: Self-pay

## 2024-09-25 DIAGNOSIS — Z510 Encounter for antineoplastic radiation therapy: Secondary | ICD-10-CM | POA: Diagnosis not present

## 2024-09-25 LAB — RAD ONC ARIA SESSION SUMMARY
Course Elapsed Days: 15
Plan Fractions Treated to Date: 10
Plan Fractions Treated to Date: 10
Plan Prescribed Dose Per Fraction: 1.8 Gy
Plan Prescribed Dose Per Fraction: 1.8 Gy
Plan Total Fractions Prescribed: 28
Plan Total Fractions Prescribed: 28
Plan Total Prescribed Dose: 50.4 Gy
Plan Total Prescribed Dose: 50.4 Gy
Reference Point Dosage Given to Date: 18 Gy
Reference Point Dosage Given to Date: 18 Gy
Reference Point Session Dosage Given: 1.8 Gy
Reference Point Session Dosage Given: 1.8 Gy
Session Number: 10

## 2024-09-26 ENCOUNTER — Ambulatory Visit: Admitting: Radiation Oncology

## 2024-09-26 ENCOUNTER — Ambulatory Visit
Admission: RE | Admit: 2024-09-26 | Discharge: 2024-09-26 | Disposition: A | Discharge status: Home / Self Care | Source: Ambulatory Visit | Attending: Radiation Oncology | Admitting: Radiation Oncology

## 2024-09-26 ENCOUNTER — Ambulatory Visit

## 2024-09-26 ENCOUNTER — Other Ambulatory Visit: Payer: Self-pay

## 2024-09-26 DIAGNOSIS — Z510 Encounter for antineoplastic radiation therapy: Secondary | ICD-10-CM | POA: Diagnosis not present

## 2024-09-26 LAB — RAD ONC ARIA SESSION SUMMARY
Course Elapsed Days: 16
Plan Fractions Treated to Date: 11
Plan Fractions Treated to Date: 11
Plan Prescribed Dose Per Fraction: 1.8 Gy
Plan Prescribed Dose Per Fraction: 1.8 Gy
Plan Total Fractions Prescribed: 28
Plan Total Fractions Prescribed: 28
Plan Total Prescribed Dose: 50.4 Gy
Plan Total Prescribed Dose: 50.4 Gy
Reference Point Dosage Given to Date: 19.8 Gy
Reference Point Dosage Given to Date: 19.8 Gy
Reference Point Session Dosage Given: 1.8 Gy
Reference Point Session Dosage Given: 1.8 Gy
Session Number: 11

## 2024-09-29 ENCOUNTER — Other Ambulatory Visit (HOSPITAL_COMMUNITY): Payer: Self-pay | Admitting: Cardiology

## 2024-09-29 ENCOUNTER — Inpatient Hospital Stay

## 2024-09-29 ENCOUNTER — Ambulatory Visit
Admission: RE | Admit: 2024-09-29 | Discharge: 2024-09-29 | Disposition: A | Discharge status: Home / Self Care | Source: Ambulatory Visit | Attending: Radiation Oncology | Admitting: Radiation Oncology

## 2024-09-29 ENCOUNTER — Other Ambulatory Visit: Payer: Self-pay

## 2024-09-29 DIAGNOSIS — Z510 Encounter for antineoplastic radiation therapy: Secondary | ICD-10-CM | POA: Diagnosis not present

## 2024-09-29 LAB — RAD ONC ARIA SESSION SUMMARY
Course Elapsed Days: 19
Plan Fractions Treated to Date: 12
Plan Fractions Treated to Date: 12
Plan Prescribed Dose Per Fraction: 1.8 Gy
Plan Prescribed Dose Per Fraction: 1.8 Gy
Plan Total Fractions Prescribed: 28
Plan Total Fractions Prescribed: 28
Plan Total Prescribed Dose: 50.4 Gy
Plan Total Prescribed Dose: 50.4 Gy
Reference Point Dosage Given to Date: 21.6 Gy
Reference Point Dosage Given to Date: 21.6 Gy
Reference Point Session Dosage Given: 1.8 Gy
Reference Point Session Dosage Given: 1.8 Gy
Session Number: 12

## 2024-09-29 MED ORDER — SACUBITRIL-VALSARTAN 97-103 MG PO TABS: 1.0000 | ORAL_TABLET | Freq: Two times a day (BID) | ORAL | 3 refills | Status: AC

## 2024-09-29 MED ORDER — DAPAGLIFLOZIN PROPANEDIOL 10 MG PO TABS: 10.0000 mg | ORAL_TABLET | Freq: Every day | ORAL | 3 refills | Status: DC

## 2024-09-30 ENCOUNTER — Ambulatory Visit
Admission: RE | Admit: 2024-09-30 | Discharge: 2024-09-30 | Disposition: A | Discharge status: Home / Self Care | Source: Ambulatory Visit | Attending: Radiation Oncology | Admitting: Radiation Oncology

## 2024-09-30 ENCOUNTER — Inpatient Hospital Stay

## 2024-09-30 ENCOUNTER — Other Ambulatory Visit: Payer: Self-pay

## 2024-09-30 ENCOUNTER — Other Ambulatory Visit (HOSPITAL_COMMUNITY): Payer: Self-pay

## 2024-09-30 DIAGNOSIS — Z510 Encounter for antineoplastic radiation therapy: Secondary | ICD-10-CM | POA: Diagnosis not present

## 2024-09-30 LAB — RAD ONC ARIA SESSION SUMMARY
Course Elapsed Days: 20
Plan Fractions Treated to Date: 13
Plan Fractions Treated to Date: 13
Plan Prescribed Dose Per Fraction: 1.8 Gy
Plan Prescribed Dose Per Fraction: 1.8 Gy
Plan Total Fractions Prescribed: 28
Plan Total Fractions Prescribed: 28
Plan Total Prescribed Dose: 50.4 Gy
Plan Total Prescribed Dose: 50.4 Gy
Reference Point Dosage Given to Date: 23.4 Gy
Reference Point Dosage Given to Date: 23.4 Gy
Reference Point Session Dosage Given: 1.8 Gy
Reference Point Session Dosage Given: 1.8 Gy
Session Number: 13

## 2024-09-30 MED ORDER — DAPAGLIFLOZIN PROPANEDIOL 10 MG PO TABS: 10.0000 mg | ORAL_TABLET | Freq: Every day | ORAL | 3 refills | Status: AC

## 2024-10-01 ENCOUNTER — Ambulatory Visit
Admission: RE | Admit: 2024-10-01 | Discharge: 2024-10-01 | Disposition: A | Discharge status: Home / Self Care | Source: Ambulatory Visit | Attending: Radiation Oncology | Admitting: Radiation Oncology

## 2024-10-01 ENCOUNTER — Other Ambulatory Visit: Payer: Self-pay

## 2024-10-01 ENCOUNTER — Inpatient Hospital Stay

## 2024-10-01 DIAGNOSIS — Z510 Encounter for antineoplastic radiation therapy: Secondary | ICD-10-CM | POA: Diagnosis not present

## 2024-10-01 LAB — RAD ONC ARIA SESSION SUMMARY
Course Elapsed Days: 21
Plan Fractions Treated to Date: 14
Plan Fractions Treated to Date: 14
Plan Prescribed Dose Per Fraction: 1.8 Gy
Plan Prescribed Dose Per Fraction: 1.8 Gy
Plan Total Fractions Prescribed: 28
Plan Total Fractions Prescribed: 28
Plan Total Prescribed Dose: 50.4 Gy
Plan Total Prescribed Dose: 50.4 Gy
Reference Point Dosage Given to Date: 25.2 Gy
Reference Point Dosage Given to Date: 25.2 Gy
Reference Point Session Dosage Given: 1.8 Gy
Reference Point Session Dosage Given: 1.8 Gy
Session Number: 14

## 2024-10-02 ENCOUNTER — Inpatient Hospital Stay

## 2024-10-02 ENCOUNTER — Other Ambulatory Visit: Payer: Self-pay

## 2024-10-02 ENCOUNTER — Ambulatory Visit
Admission: RE | Admit: 2024-10-02 | Discharge: 2024-10-02 | Disposition: A | Source: Ambulatory Visit | Attending: Radiation Oncology

## 2024-10-02 DIAGNOSIS — Z510 Encounter for antineoplastic radiation therapy: Secondary | ICD-10-CM | POA: Diagnosis not present

## 2024-10-02 LAB — RAD ONC ARIA SESSION SUMMARY
Course Elapsed Days: 22
Plan Fractions Treated to Date: 15
Plan Fractions Treated to Date: 15
Plan Prescribed Dose Per Fraction: 1.8 Gy
Plan Prescribed Dose Per Fraction: 1.8 Gy
Plan Total Fractions Prescribed: 28
Plan Total Fractions Prescribed: 28
Plan Total Prescribed Dose: 50.4 Gy
Plan Total Prescribed Dose: 50.4 Gy
Reference Point Dosage Given to Date: 27 Gy
Reference Point Dosage Given to Date: 27 Gy
Reference Point Session Dosage Given: 1.8 Gy
Reference Point Session Dosage Given: 1.8 Gy
Session Number: 15

## 2024-10-03 ENCOUNTER — Ambulatory Visit
Admission: RE | Admit: 2024-10-03 | Discharge: 2024-10-03 | Disposition: A | Discharge status: Home / Self Care | Source: Ambulatory Visit | Attending: Radiation Oncology | Admitting: Radiation Oncology

## 2024-10-03 ENCOUNTER — Inpatient Hospital Stay

## 2024-10-03 ENCOUNTER — Other Ambulatory Visit: Payer: Self-pay

## 2024-10-03 DIAGNOSIS — Z510 Encounter for antineoplastic radiation therapy: Secondary | ICD-10-CM | POA: Diagnosis not present

## 2024-10-03 LAB — RAD ONC ARIA SESSION SUMMARY
Course Elapsed Days: 23
Plan Fractions Treated to Date: 16
Plan Fractions Treated to Date: 16
Plan Prescribed Dose Per Fraction: 1.8 Gy
Plan Prescribed Dose Per Fraction: 1.8 Gy
Plan Total Fractions Prescribed: 28
Plan Total Fractions Prescribed: 28
Plan Total Prescribed Dose: 50.4 Gy
Plan Total Prescribed Dose: 50.4 Gy
Reference Point Dosage Given to Date: 28.8 Gy
Reference Point Dosage Given to Date: 28.8 Gy
Reference Point Session Dosage Given: 1.8 Gy
Reference Point Session Dosage Given: 1.8 Gy
Session Number: 16

## 2024-10-06 ENCOUNTER — Ambulatory Visit

## 2024-10-06 ENCOUNTER — Other Ambulatory Visit: Payer: Self-pay | Admitting: Family

## 2024-10-06 ENCOUNTER — Inpatient Hospital Stay

## 2024-10-06 DIAGNOSIS — J4540 Moderate persistent asthma, uncomplicated: Secondary | ICD-10-CM

## 2024-10-07 ENCOUNTER — Inpatient Hospital Stay

## 2024-10-07 ENCOUNTER — Telehealth: Payer: Self-pay

## 2024-10-07 ENCOUNTER — Other Ambulatory Visit: Payer: Self-pay

## 2024-10-07 ENCOUNTER — Ambulatory Visit
Admission: RE | Admit: 2024-10-07 | Discharge: 2024-10-07 | Disposition: A | Discharge status: Home / Self Care | Source: Ambulatory Visit | Attending: Radiation Oncology | Admitting: Radiation Oncology

## 2024-10-07 DIAGNOSIS — Z510 Encounter for antineoplastic radiation therapy: Secondary | ICD-10-CM | POA: Diagnosis not present

## 2024-10-07 LAB — RAD ONC ARIA SESSION SUMMARY
Course Elapsed Days: 27
Plan Fractions Treated to Date: 1
Plan Fractions Treated to Date: 1
Plan Prescribed Dose Per Fraction: 1.8 Gy
Plan Prescribed Dose Per Fraction: 1.8 Gy
Plan Total Fractions Prescribed: 12
Plan Total Fractions Prescribed: 12
Plan Total Prescribed Dose: 21.6 Gy
Plan Total Prescribed Dose: 21.6 Gy
Reference Point Dosage Given to Date: 30.6 Gy
Reference Point Dosage Given to Date: 30.6 Gy
Reference Point Session Dosage Given: 1.8 Gy
Reference Point Session Dosage Given: 1.8 Gy
Session Number: 17

## 2024-10-07 NOTE — Telephone Encounter (Signed)
 Left message for patient about upcoming appointment on 11/19

## 2024-10-08 ENCOUNTER — Ambulatory Visit
Admission: RE | Admit: 2024-10-08 | Discharge: 2024-10-08 | Disposition: A | Discharge status: Home / Self Care | Source: Ambulatory Visit | Attending: Radiation Oncology | Admitting: Radiation Oncology

## 2024-10-08 ENCOUNTER — Other Ambulatory Visit: Payer: Self-pay

## 2024-10-08 ENCOUNTER — Inpatient Hospital Stay

## 2024-10-08 ENCOUNTER — Inpatient Hospital Stay: Admitting: Hematology and Oncology

## 2024-10-08 ENCOUNTER — Telehealth: Payer: Self-pay

## 2024-10-08 VITALS — BP 120/85 | HR 79 | Temp 97.7°F | Resp 18 | Wt 269.4 lb

## 2024-10-08 DIAGNOSIS — Z510 Encounter for antineoplastic radiation therapy: Secondary | ICD-10-CM | POA: Diagnosis not present

## 2024-10-08 DIAGNOSIS — Z170 Estrogen receptor positive status [ER+]: Secondary | ICD-10-CM

## 2024-10-08 DIAGNOSIS — C50212 Malignant neoplasm of upper-inner quadrant of left female breast: Secondary | ICD-10-CM

## 2024-10-08 LAB — CBC WITH DIFFERENTIAL/PLATELET
Abs Immature Granulocytes: 0 K/uL (ref 0.00–0.07)
Basophils Absolute: 0 K/uL (ref 0.0–0.1)
Basophils Relative: 0 %
Eosinophils Absolute: 0.2 K/uL (ref 0.0–0.5)
Eosinophils Relative: 5 %
HCT: 41 % (ref 36.0–46.0)
Hemoglobin: 13.6 g/dL (ref 12.0–15.0)
Immature Granulocytes: 0 %
Lymphocytes Relative: 26 %
Lymphs Abs: 0.8 K/uL (ref 0.7–4.0)
MCH: 29.1 pg (ref 26.0–34.0)
MCHC: 33.2 g/dL (ref 30.0–36.0)
MCV: 87.6 fL (ref 80.0–100.0)
Monocytes Absolute: 0.3 K/uL (ref 0.1–1.0)
Monocytes Relative: 9 %
Neutro Abs: 1.9 K/uL (ref 1.7–7.7)
Neutrophils Relative %: 60 %
Platelets: 185 K/uL (ref 150–400)
RBC: 4.68 MIL/uL (ref 3.87–5.11)
RDW: 14.9 % (ref 11.5–15.5)
Smear Review: NORMAL
WBC: 3.2 K/uL — ABNORMAL LOW (ref 4.0–10.5)
nRBC: 0 % (ref 0.0–0.2)

## 2024-10-08 LAB — RAD ONC ARIA SESSION SUMMARY
Course Elapsed Days: 28
Plan Fractions Treated to Date: 2
Plan Fractions Treated to Date: 2
Plan Prescribed Dose Per Fraction: 1.8 Gy
Plan Prescribed Dose Per Fraction: 1.8 Gy
Plan Total Fractions Prescribed: 12
Plan Total Fractions Prescribed: 12
Plan Total Prescribed Dose: 21.6 Gy
Plan Total Prescribed Dose: 21.6 Gy
Reference Point Dosage Given to Date: 32.4 Gy
Reference Point Dosage Given to Date: 32.4 Gy
Reference Point Session Dosage Given: 1.8 Gy
Reference Point Session Dosage Given: 1.8 Gy
Session Number: 18

## 2024-10-08 MED ORDER — GABAPENTIN 300 MG PO CAPS: 300.0000 mg | ORAL_CAPSULE | Freq: Every day | ORAL | 1 refills | Status: DC

## 2024-10-08 NOTE — Telephone Encounter (Signed)
 Not a patient of this practice

## 2024-10-08 NOTE — Progress Notes (Signed)
 [facility] Telephone:(336) [phone]   Fax:([phone]  PROGRESS NOTE  Patient Care Team: Laurice President, NP as PCP - General (Nurse Practitioner) Nahser, Aleene PARAS, MD (Inactive) as PCP - Cardiology (Cardiology) Nahser, Aleene PARAS, MD (Inactive) as Consulting Physician (Cardiology) Tyree Nanetta SAILOR, RN as Oncology Nurse Navigator Loretha Ash, MD as Consulting Physician (Hematology and Oncology)   CHIEF COMPLAINTS/PURPOSE OF CONSULTATION:  Left breast cancer  Oncology History  Malignant neoplasm of upper-inner quadrant of left breast in female, estrogen receptor positive (HCC)  10/11/2023 Mammogram   There is a dominant solid mass at the palpable site of concern in the left breast at 6 o'clock measuring 4.2 cm. There are multiple other smaller solid-appearing masses in the left breast at 8 o'clock, 9 o'clock and 10 o'clock. No evidence of left axillary lymphadenopathy. No evidence of malignancy in the right breast.   03/11/2024 Initial Diagnosis   Malignant neoplasm of upper-inner quadrant of left breast in female, estrogen receptor positive (HCC)   03/11/2024 Cancer Staging   Staging form: Breast, AJCC 8th Edition - Pathologic: Stage IB (pT3, pN1(sn), cM0, G2, ER+, PR+, HER2-) - Signed by Loretha Ash, MD on 05/09/2024 Method of lymph node assessment: Sentinel lymph node biopsy Histologic grading system: 3 grade system   03/13/2024 Pathology Results   Left lumpectomy  Papillary carcinoma with invasion, 5.1 cm, grade 2  Ductal carcinoma in situ: Cribriform, nuclear grade 2  Margins, invasive: Positive      Closest, invasive: Posterior and medial  Margins, DCIS: Negative      Closest, DCIS: 1 mm, superior and anterior  Lymphovascular invasion: Not identified   The tumor cells are NEGATIVE for Her2 (1+).   Estrogen Receptor:       95%, POSTIVE, STRONG STAINING INTENSITY  Progesterone Receptor:   10%, POSITIVE, MODERATE STAINING INTENSITY  Proliferation  Marker Ki-67:   20%    05/21/2024 -  Chemotherapy   Patient is on Treatment Plan : BREAST TC q21d      Genetic Testing   [facility] CancerNext-Expanded Panel+RNA was Negative. Of note, a variant of uncertain significance was detected in the GATA2 gene (p.A342T). Report date is 05/20/2024.  The CancerNext-Expanded gene panel offered by [facility] and includes sequencing, rearrangement, and RNA analysis for the following 77 genes: AIP, ALK, APC, ATM, AXIN2, BAP1, BARD1, BMPR1A, BRCA1, BRCA2, BRIP1, CDC73, CDH1, CDK4, CDKN1B, CDKN2A, CEBPA, CHEK2, CTNNA1, DDX41, DICER1, ETV6, FH, FLCN, GATA2, LZTR1, MAX, MBD4, MEN1, MET, MLH1, MSH2, MSH3, MSH6, MUTYH, NF1, NF2, NTHL1, PALB2, PHOX2B, PMS2, POT1, PRKAR1A, PTCH1, PTEN, RAD51C, RAD51D, RB1, RET, RPS20, RUNX1, SDHA, SDHAF2, SDHB, SDHC, SDHD, SMAD4, SMARCA4, SMARCB1, SMARCE1, STK11, SUFU, TMEM127, TP53, TSC1, TSC2, VHL, and WT1 (sequencing and deletion/duplication); EGFR, HOXB13, KIT, MITF, PDGFRA, POLD1, and POLE (sequencing only); EPCAM and GREM1 (deletion/duplication only).     08/17/2024 Cancer Staging   Staging form: Breast, AJCC 8th Edition - Pathologic stage from 08/17/2024: Stage IB (pT3, pN1, cM0, G2, ER+, PR+, HER2-) - Signed by Lanell Donald Stagger, PA-C on 08/17/2024 Stage prefix: Initial diagnosis Multigene prognostic tests performed: None Histologic grading system: 3 grade system    CURRENT TREATMENT: Taxotere  and Cytoxan   INTERVAL HISTORY:  [name] 58 y.o. female Ms. Lamarre returns for toxicity check prior to cycle 3-day 1 of TC chemotherapy.   Discussed the use of AI scribe software for clinical note transcription with the patient, who gave verbal consent to proceed.  History of Present Illness  Janet [name] is a 58  year old female undergoing radiation therapy for breast cancer who presents with bronchitis and neuropathy.  She is currently undergoing radiation therapy for breast cancer, expected to continue until  mid-December. There is significant swelling in her left breast, which is being monitored by her radiation team.  She developed neuropathy after her third chemotherapy treatment, described as 'tingling' and 'blackness' in her fingers and toes. The neuropathy is severe, sometimes leading to a lack of sensation, such as not feeling a cut on her finger last month. No history of diabetes or prior neuropathy. She hasn't reported this before.  She is experiencing bronchitis, characterized by a persistent cough and difficulty breathing, which began recently. She started a Z-Pak (azithromycin ) yesterday. No current pain associated with bronchitis, but significant coughing and breathing difficulties are noted.  She has discontinued metoclopramide and prednisone , but continues to take the Z-Pak.  Rest of the pertinent 10 ROS reviewed and neg  MEDICAL HISTORY:  Past Medical History:  Diagnosis Date   Allergy    Anxiety    Arthritis    Asthma    Breast cancer (HCC) 02/28/2024   CHF (congestive heart failure) (HCC)    Congestive heart failure (CHF) (HCC)    Depression    Elevated brain natriuretic peptide (BNP) level    Hypertension    Lymphedema    Nonischemic cardiomyopathy (HCC) 03/2021   Pre-diabetes    SOB (shortness of breath)    Substance abuse (HCC)     SURGICAL HISTORY: Past Surgical History:  Procedure Laterality Date   ABDOMINAL HYSTERECTOMY     AXILLARY SENTINEL NODE BIOPSY  04/08/2024   Procedure: BIOPSY, LYMPH NODE, SENTINEL, AXILLARY;  Surgeon: Vanderbilt Ned, MD;  Location: [facility];  Service: General;;  LEFT SENTINEL LYMPH NODE MAPPING AND BIOPSY X 3   BREAST BIOPSY Right    BREAST BIOPSY Left 10/16/2023   times 3   BREAST BIOPSY Left 10/16/2023   US  LT BREAST BX W LOC DEV EA ADD LESION IMG BX SPEC US  GUIDE 10/16/2023 [facility]   BREAST BIOPSY Left 10/16/2023   US  LT BREAST BX W LOC DEV 1ST LESION IMG BX SPEC US  GUIDE 10/16/2023 [facility]   BREAST BIOPSY  Left 10/16/2023   US  LT BREAST BX W LOC DEV EA ADD LESION IMG BX SPEC US  GUIDE 10/16/2023 [facility]   BREAST BIOPSY Left 02/27/2024   US  LT RADIOACTIVE SEED LOC 02/27/2024 [facility]   BREAST BIOPSY Left 02/27/2024   US  LT RADIOACTIVE SEED EA ADD LESION 02/27/2024 [facility]   BREAST LUMPECTOMY Left 04/08/2024   Procedure: BREAST LUMPECTOMY;  Surgeon: Vanderbilt Ned, MD;  Location: [facility];  Service: General;  Laterality: Left;  LEFT BREAST RE-EXCISION LUMPECTOMY, LEFT SENTINEL LYMPH NODE MAPPING   BREAST LUMPECTOMY WITH RADIOACTIVE SEED LOCALIZATION Left 02/28/2024   Procedure: LEFT BREAST SEED LUMPECTOMY;  Surgeon: Vanderbilt Ned, MD;  Location: [facility];  Service: General;  Laterality: Left;  3 seeds   CESAREAN SECTION     COLONOSCOPY WITH PROPOFOL  N/A 12/10/2023   Procedure: COLONOSCOPY WITH PROPOFOL ;  Surgeon: Leigh Elspeth SQUIBB, MD;  Location: [facility];  Service: Gastroenterology;  Laterality: N/A;   HEMOSTASIS CLIP PLACEMENT  12/10/2023   Procedure: HEMOSTASIS CLIP PLACEMENT;  Surgeon: Leigh Elspeth SQUIBB, MD;  Location: [facility];  Service: Gastroenterology;;   HERNIA REPAIR     umbilical as a child   POLYPECTOMY  12/10/2023   Procedure: POLYPECTOMY;  Surgeon: Leigh Elspeth SQUIBB, MD;  Location: [facility];  Service:  Gastroenterology;;   RIGHT/LEFT HEART CATH AND CORONARY ANGIOGRAPHY N/A 04/01/2021   Procedure: RIGHT/LEFT HEART CATH AND CORONARY ANGIOGRAPHY;  Surgeon: Dann Candyce RAMAN, MD;  Location: [facility] INVASIVE CV LAB;  Service: Cardiovascular;  Laterality: N/A;   TUBAL LIGATION      SOCIAL HISTORY: Social History   Socioeconomic History   Marital status: Single    Spouse name: Not on file   Number of children: 2   Years of education: 14   Highest education level: Associate degree: academic program  Occupational History   Occupation: Consulting Civil Engineer   Occupation: disabled  Tobacco Use   Smoking status: Never    Passive exposure: Current   Smokeless  tobacco: Never  Vaping Use   Vaping status: Never Used  Substance and Sexual Activity   Alcohol use: Not Currently    Comment: occ   Drug use: Yes    Frequency: 1.0 times per week    Types: Marijuana    Comment: Pt has not sued cocaine in 6 months ( as of 04/07/24) twice a week   Sexual activity: Not Currently    Birth control/protection: Surgical  Other Topics Concern   Not on file  Social History Narrative   Not on file   Social Drivers of Health   Financial Resource Strain: Low Risk  (01/03/2024)   Overall Financial Resource Strain (CARDIA)    Difficulty of Paying Living Expenses: Not very hard  Food Insecurity: No Food Insecurity (08/20/2024)   Hunger Vital Sign    Worried About Running Out of Food in the Last Year: Never true    Ran Out of Food in the Last Year: Never true  Transportation Needs: No Transportation Needs (08/20/2024)   PRAPARE - Administrator, Civil Service (Medical): No    Lack of Transportation (Non-Medical): No  Physical Activity: Inactive (01/03/2024)   Exercise Vital Sign    Days of Exercise per Week: 0 days    Minutes of Exercise per Session: 0 min  Stress: Stress Concern Present (01/03/2024)   [employer] of Occupational Health - Occupational Stress Questionnaire    Feeling of Stress : Very much  Social Connections: Socially Isolated (01/03/2024)   Social Connection and Isolation Panel    Frequency of Communication with Friends and Family: More than three times a week    Frequency of Social Gatherings with Friends and Family: Once a week    Attends Religious Services: Never    Database Administrator or Organizations: No    Attends Engineer, Structural: Not on file    Marital Status: Never married  Intimate Partner Violence: Not At Risk (08/20/2024)   Humiliation, Afraid, Rape, and Kick questionnaire    Fear of Current or Ex-Partner: No    Emotionally Abused: No    Physically Abused: No    Sexually Abused: No    FAMILY  HISTORY: Family History  Problem Relation Age of Onset   Colon polyps Mother    Hypertension Mother    Stroke Mother    Deep vein thrombosis Mother    Congestive Heart Failure Mother    Arthritis Mother    Asthma Mother    Depression Mother    Other Father        history unknown   Obesity Sister    Diabetes Sister    Obesity Brother    Lung cancer Maternal Uncle        smoked   Congestive Heart Failure Maternal Grandmother  Throat cancer Maternal Grandfather        smoked   Colon cancer Neg Hx    Esophageal cancer Neg Hx    Rectal cancer Neg Hx    Stomach cancer Neg Hx     ALLERGIES:  is allergic to clarithromycin, penicillins, and penicillin g.  MEDICATIONS:  Current Outpatient Medications  Medication Sig Dispense Refill   albuterol  (PROVENTIL ) (2.5 MG/3ML) 0.083% nebulizer solution Take 3 mLs (2.5 mg total) by nebulization every 4 (four) hours as needed for wheezing or shortness of breath. 75 mL 2   albuterol  (VENTOLIN  HFA) 108 (90 Base) MCG/ACT inhaler Inhale 2 puffs into the lungs every 6 (six) hours as needed for wheezing or shortness of breath. 18 g 2   azithromycin  (ZITHROMAX ) 250 MG tablet Take 250 mg by mouth as directed.     Budeson-Glycopyrrol-Formoterol  (BREZTRI  AEROSPHERE) 160-9-4.8 MCG/ACT AERO Inhale 2 puffs into the lungs in the morning and at bedtime. 10.7 g 5   carvedilol  (COREG ) 12.5 MG tablet Take 1 tablet (12.5 mg total) by mouth 2 (two) times daily with a meal. 60 tablet 11   dapagliflozin  propanediol (FARXIGA ) 10 MG TABS tablet Take 1 tablet (10 mg total) by mouth daily before breakfast. 90 tablet 3   DULoxetine (CYMBALTA) 30 MG capsule Take 30 mg by mouth daily.     fluticasone  (FLONASE ) 50 MCG/ACT nasal spray Place 1 spray into both nostrils daily. 15.8 mL 0   furosemide  (LASIX ) 20 MG tablet TAKE ONE TABLET (20 MG) BY MOUTH DAILY AT 9AM 30 tablet 11   ibuprofen (ADVIL) 800 MG tablet Take 800 mg by mouth every 6 (six) hours as needed for fever,  headache or mild pain (pain score 1-3).     melatonin 1 MG TABS tablet Take 1 mg by mouth at bedtime.     methylPREDNISolone  (MEDROL  DOSEPAK) 4 MG TBPK tablet Use as directed 21 tablet 0   metoCLOPramide (REGLAN) 10 MG tablet Take 1 tablet (10 mg total) by mouth every 6 (six) hours as needed for nausea or vomiting. 30 tablet 0   montelukast  (SINGULAIR ) 10 MG tablet Take 1 tablet (10 mg total) by mouth at bedtime. 90 tablet 0   Multiple Vitamin (MULTIVITAMIN) tablet Take 1 tablet by mouth daily.     sacubitril -valsartan  (ENTRESTO ) 97-103 MG Take 1 tablet by mouth 2 (two) times daily. 180 tablet 3   spironolactone  (ALDACTONE ) 25 MG tablet TAKE ONE TABLET (25 MG) BY MOUTH DAILY 90 tablet 11   traZODone  (DESYREL ) 150 MG tablet Take 150 mg by mouth at bedtime.     No current facility-administered medications for this visit.    REVIEW OF SYSTEMS:   Constitutional: ( - ) fevers, ( - )  chills , ( - ) night sweats Eyes: ( - ) blurriness of vision, ( - ) double vision, ( - ) watery eyes Ears, nose, mouth, throat, and face: ( - ) mucositis, ( - ) sore throat Respiratory: ( - ) cough, ( - ) dyspnea, ( - ) wheezes Cardiovascular: ( - ) palpitation, ( - ) chest discomfort, (+) lower extremity swelling Gastrointestinal:  ( - ) nausea, ( + ) heartburn, ( - ) change in bowel habits Skin: ( - ) abnormal skin rashes Lymphatics: ( - ) new lymphadenopathy, ( - ) easy bruising Neurological: ( - ) numbness, ( - ) tingling, ( - ) new weaknesses Behavioral/Psych: ( - ) mood change, ( - ) new changes  All other systems were reviewed  with the patient and are negative.  PHYSICAL EXAMINATION: ECOG PERFORMANCE STATUS: 1 - Symptomatic but completely ambulatory  There were no vitals filed for this visit.  There were no vitals filed for this visit.   GENERAL: well appearing fema;e in NAD    LABORATORY DATA:  I have reviewed the data as listed    Latest Ref Rng & Units 07/25/2024   11:29 AM 07/04/2024    9:24  AM 06/13/2024   10:43 AM  CBC  WBC 4.0 - 10.5 K/uL 10.9  8.5  10.3   Hemoglobin 12.0 - 15.0 g/dL 87.8  87.9  87.4   Hematocrit 36.0 - 46.0 % 36.7  37.1  39.1   Platelets 150 - 400 K/uL 338  287  452        Latest Ref Rng & Units 07/25/2024   11:29 AM 07/04/2024    9:24 AM 06/13/2024   10:43 AM  CMP  Glucose 70 - 99 mg/dL 881  873  823   BUN 6 - 20 mg/dL 18  17  17    Creatinine 0.44 - 1.00 mg/dL 9.12  9.04  9.08   Sodium 135 - 145 mmol/L 139  141  139   Potassium 3.5 - 5.1 mmol/L 4.2  4.3  3.8   Chloride 98 - 111 mmol/L 107  109  108   CO2 22 - 32 mmol/L 27  25  24    Calcium 8.9 - 10.3 mg/dL 9.6  9.2  9.1   Total Protein 6.5 - 8.1 g/dL 6.6  6.3  6.5   Total Bilirubin 0.0 - 1.2 mg/dL 0.4  0.3  0.3   Alkaline Phos 38 - 126 U/L 58  60  65   AST 15 - 41 U/L 20  16  15    ALT 0 - 44 U/L 24  19  26     RADIOGRAPHIC STUDIES: I have personally reviewed the radiological images as listed and agreed with the findings in the report. No results found.   PATHOLOGY:  A. BREAST, LEFT, LUMPECTOMY: Papillary carcinoma with invasion, 5.1 cm, grade 2 Ductal carcinoma in situ: Cribriform, nuclear grade 2 Margins, invasive: Positive     Closest, invasive: Posterior and medial Margins, DCIS: Negative     Closest, DCIS: 1 mm, superior and anterior Lymphovascular invasion: Not identified Prognostic markers:  ER pending, PR pending, Her2 pending, Ki-67 pending Other: Biopsy site and biopsy clip See oncology table Oncology table: INVASIVE CARCINOMA OF THE BREAST:  Resection Procedure: Left breast lumpectomy Specimen Laterality: Left breast Histologic Type: Papillary carcinoma with invasion Histologic Grade:      Glandular (Acinar)/Tubular Differentiation: 3      Nuclear Pleomorphism: 2      Mitotic Rate: 1      Overall Grade: 2 Tumor Size: 5.1 x 2.8 x 2 cm Ductal Carcinoma In Situ: Cribriform with intermediate nuclear grade Lymphatic and/or Vascular Invasion: Not identified Treatment Effect  in the Breast: No known presurgical therapy Margins:      Distance from Closest Margin (mm): 0 mm      Specify Closest Margin (required only if <76mm): Posterior and medial DCIS Margins:      Distance from Closest Margin (mm): 1 mm      Specify Closest Margin (required only if <37mm): Superior and anterior Regional Lymph Nodes: No lymph nodes submitted] Distant Metastasis:      Distant Site(s) Involved: Not applicable    The tumor cells are NEGATIVE for Her2 (1+).   Estrogen Receptor:  95%, POSTIVE, STRONG STAINING INTENSITY  Progesterone Receptor:   10%, POSITIVE, MODERATE STAINING INTENSITY  Proliferation Marker Ki-67:   20%   ASSESSMENT & PLAN Janet [name] is a 58 y.o. female who presents to the clinic for continued management of left breast cancer.  #Malignant neoplasm of upper-inner quadrant of left breast in female, estrogen receptor positive: --Underwent left lumpectomy on 03/13/2024 with pathology review as above.  --High oncotype score of 42 suggests significant chemotherapy benefit. Five-year recurrence risk is 25%. Chemotherapy recommended due to oncotype score and lymph node involvement. Cardiac concerns necessitate reconsideration of initial chemotherapy plan. Decision made to consider less intensive regimen due to cardiac dysfunction.  --Started TC chemotherapy on 05/21/2024.  PLAN: --Due for cycle 4, day 1 today.   Assessment and Plan Assessment & Plan  Malignant neoplasm of upper-inner quadrant of left breast undergoing radiation therapy Undergoing radiation therapy. Positive lymph node status indicates need for additional medication therapy. - Continue radiation therapy as scheduled. - Ordered menstrual labs to determine menopausal status. - Will discuss potential medication options (tamoxifen, anastrozole, letrozole, exemestane) based on menopausal status. - Schedule follow-up appointment in December to discuss medication  options.  Chemotherapy-induced peripheral neuropathy Mild peripheral neuropathy in fingers and toes, likely chemotherapy-induced. Symptoms include tingling, numbness, and lack of sensation. - Prescribed gabapentin once at night to manage neuropathy symptoms. - Reevaluate neuropathy symptoms at follow-up appointment in December.  Acute bronchitis Experiencing acute bronchitis with significant coughing.  Left breast swelling during radiation therapy Swelling in the left breast during radiation therapy. No infection present. - Continue monitoring by radiation oncologist.   All questions were answered. The patient knows to call the clinic with any problems, questions or concerns.  I have spent a total of 30 minutes minutes of face-to-face and non-face-to-face time, preparing to see the patient, performing a medically appropriate examination, counseling and educating the patient, ordering medications/tests/procedures, rdocumenting clinical information in the electronic health record, independently interpreting results and communicating results to the patient, and care coordination.

## 2024-10-08 NOTE — Telephone Encounter (Signed)
 Copied from CRM #[id number]. Topic: Clinical - Medication Refill >> Sep 22, 2024  5:54 PM Lauren C wrote: Medication:  montelukast  (SINGULAIR ) 10 MG tablet  Has the patient contacted their pharmacy? Yes The pharmacy is the one reaching out   This is the patient's preferred pharmacy:  [facility] [place] [address] [place], [place] [address] Phone: [phone]  Fax: [phone]    Is this the correct pharmacy for this prescription? Yes If no, delete pharmacy and type the correct one.   Has the prescription been filled recently? Yes  Is the patient out of the medication? Unknown  Has the patient been seen for an appointment in the last year OR does the patient have an upcoming appointment? Yes  Can we respond through MyChart? Yes  Agent: Please be advised that Rx refills may take up to 3 business days. We ask that you follow-up with your pharmacy. >> Oct 07, 2024 10:09 AM Emylou G wrote: Pharmacy called.. checking status of refill ( [facility] )

## 2024-10-09 ENCOUNTER — Ambulatory Visit
Admission: RE | Admit: 2024-10-09 | Discharge: 2024-10-09 | Disposition: A | Discharge status: Home / Self Care | Source: Ambulatory Visit | Attending: Radiation Oncology | Admitting: Radiation Oncology

## 2024-10-09 ENCOUNTER — Inpatient Hospital Stay

## 2024-10-09 ENCOUNTER — Other Ambulatory Visit: Payer: Self-pay

## 2024-10-09 ENCOUNTER — Ambulatory Visit

## 2024-10-09 DIAGNOSIS — Z510 Encounter for antineoplastic radiation therapy: Secondary | ICD-10-CM | POA: Diagnosis not present

## 2024-10-09 LAB — RAD ONC ARIA SESSION SUMMARY
Course Elapsed Days: 29
Plan Fractions Treated to Date: 3
Plan Fractions Treated to Date: 3
Plan Prescribed Dose Per Fraction: 1.8 Gy
Plan Prescribed Dose Per Fraction: 1.8 Gy
Plan Total Fractions Prescribed: 12
Plan Total Fractions Prescribed: 12
Plan Total Prescribed Dose: 21.6 Gy
Plan Total Prescribed Dose: 21.6 Gy
Reference Point Dosage Given to Date: 34.2 Gy
Reference Point Dosage Given to Date: 34.2 Gy
Reference Point Session Dosage Given: 1.8 Gy
Reference Point Session Dosage Given: 1.8 Gy
Session Number: 19

## 2024-10-09 LAB — ESTRADIOL: Estradiol: 7.6 pg/mL

## 2024-10-09 LAB — FSH/LH
FSH: 29.1 m[IU]/mL
LH: 15.6 m[IU]/mL

## 2024-10-10 ENCOUNTER — Ambulatory Visit

## 2024-10-10 ENCOUNTER — Ambulatory Visit
Admission: RE | Admit: 2024-10-10 | Discharge: 2024-10-10 | Disposition: A | Discharge status: Home / Self Care | Source: Ambulatory Visit | Attending: Radiation Oncology | Admitting: Radiation Oncology

## 2024-10-10 ENCOUNTER — Other Ambulatory Visit: Payer: Self-pay

## 2024-10-10 DIAGNOSIS — Z510 Encounter for antineoplastic radiation therapy: Secondary | ICD-10-CM | POA: Diagnosis not present

## 2024-10-10 LAB — RAD ONC ARIA SESSION SUMMARY
Course Elapsed Days: 30
Plan Fractions Treated to Date: 4
Plan Fractions Treated to Date: 4
Plan Prescribed Dose Per Fraction: 1.8 Gy
Plan Prescribed Dose Per Fraction: 1.8 Gy
Plan Total Fractions Prescribed: 12
Plan Total Fractions Prescribed: 12
Plan Total Prescribed Dose: 21.6 Gy
Plan Total Prescribed Dose: 21.6 Gy
Reference Point Dosage Given to Date: 36 Gy
Reference Point Dosage Given to Date: 36 Gy
Reference Point Session Dosage Given: 1.8 Gy
Reference Point Session Dosage Given: 1.8 Gy
Session Number: 20

## 2024-10-13 ENCOUNTER — Ambulatory Visit
Admission: RE | Admit: 2024-10-13 | Discharge: 2024-10-13 | Disposition: A | Source: Ambulatory Visit | Attending: Radiation Oncology

## 2024-10-13 ENCOUNTER — Inpatient Hospital Stay

## 2024-10-13 ENCOUNTER — Ambulatory Visit

## 2024-10-13 ENCOUNTER — Other Ambulatory Visit: Payer: Self-pay

## 2024-10-13 DIAGNOSIS — Z510 Encounter for antineoplastic radiation therapy: Secondary | ICD-10-CM | POA: Diagnosis not present

## 2024-10-13 LAB — RAD ONC ARIA SESSION SUMMARY
Course Elapsed Days: 33
Plan Fractions Treated to Date: 5
Plan Fractions Treated to Date: 5
Plan Prescribed Dose Per Fraction: 1.8 Gy
Plan Prescribed Dose Per Fraction: 1.8 Gy
Plan Total Fractions Prescribed: 12
Plan Total Fractions Prescribed: 12
Plan Total Prescribed Dose: 21.6 Gy
Plan Total Prescribed Dose: 21.6 Gy
Reference Point Dosage Given to Date: 37.8 Gy
Reference Point Dosage Given to Date: 37.8 Gy
Reference Point Session Dosage Given: 1.8 Gy
Reference Point Session Dosage Given: 1.8 Gy
Session Number: 21

## 2024-10-14 ENCOUNTER — Other Ambulatory Visit: Payer: Self-pay

## 2024-10-14 ENCOUNTER — Ambulatory Visit
Admission: RE | Admit: 2024-10-14 | Discharge: 2024-10-14 | Disposition: A | Discharge status: Home / Self Care | Source: Ambulatory Visit | Attending: Radiation Oncology | Admitting: Radiation Oncology

## 2024-10-14 ENCOUNTER — Inpatient Hospital Stay

## 2024-10-14 DIAGNOSIS — Z510 Encounter for antineoplastic radiation therapy: Secondary | ICD-10-CM | POA: Diagnosis not present

## 2024-10-14 LAB — RAD ONC ARIA SESSION SUMMARY
Course Elapsed Days: 34
Plan Fractions Treated to Date: 6
Plan Fractions Treated to Date: 6
Plan Prescribed Dose Per Fraction: 1.8 Gy
Plan Prescribed Dose Per Fraction: 1.8 Gy
Plan Total Fractions Prescribed: 12
Plan Total Fractions Prescribed: 12
Plan Total Prescribed Dose: 21.6 Gy
Plan Total Prescribed Dose: 21.6 Gy
Reference Point Dosage Given to Date: 39.6 Gy
Reference Point Dosage Given to Date: 39.6 Gy
Reference Point Session Dosage Given: 1.8 Gy
Reference Point Session Dosage Given: 1.8 Gy
Session Number: 22

## 2024-10-15 ENCOUNTER — Ambulatory Visit
Admission: RE | Admit: 2024-10-15 | Discharge: 2024-10-15 | Disposition: A | Source: Ambulatory Visit | Attending: Radiation Oncology

## 2024-10-15 ENCOUNTER — Ambulatory Visit

## 2024-10-15 ENCOUNTER — Other Ambulatory Visit: Payer: Self-pay

## 2024-10-15 ENCOUNTER — Ambulatory Visit
Admission: RE | Admit: 2024-10-15 | Discharge: 2024-10-15 | Disposition: A | Discharge status: Home / Self Care | Source: Ambulatory Visit | Attending: Radiation Oncology | Admitting: Radiation Oncology

## 2024-10-15 ENCOUNTER — Inpatient Hospital Stay

## 2024-10-15 DIAGNOSIS — Z510 Encounter for antineoplastic radiation therapy: Secondary | ICD-10-CM | POA: Diagnosis not present

## 2024-10-15 LAB — RAD ONC ARIA SESSION SUMMARY
Course Elapsed Days: 35
Plan Fractions Treated to Date: 7
Plan Fractions Treated to Date: 7
Plan Prescribed Dose Per Fraction: 1.8 Gy
Plan Prescribed Dose Per Fraction: 1.8 Gy
Plan Total Fractions Prescribed: 12
Plan Total Fractions Prescribed: 12
Plan Total Prescribed Dose: 21.6 Gy
Plan Total Prescribed Dose: 21.6 Gy
Reference Point Dosage Given to Date: 41.4 Gy
Reference Point Dosage Given to Date: 41.4 Gy
Reference Point Session Dosage Given: 1.8 Gy
Reference Point Session Dosage Given: 1.8 Gy
Session Number: 23

## 2024-10-20 ENCOUNTER — Other Ambulatory Visit: Payer: Self-pay

## 2024-10-20 ENCOUNTER — Ambulatory Visit
Admission: RE | Admit: 2024-10-20 | Discharge: 2024-10-20 | Disposition: A | Source: Ambulatory Visit | Attending: Radiation Oncology

## 2024-10-20 ENCOUNTER — Inpatient Hospital Stay

## 2024-10-20 DIAGNOSIS — C50212 Malignant neoplasm of upper-inner quadrant of left female breast: Secondary | ICD-10-CM | POA: Diagnosis present

## 2024-10-20 DIAGNOSIS — Z510 Encounter for antineoplastic radiation therapy: Secondary | ICD-10-CM | POA: Insufficient documentation

## 2024-10-20 DIAGNOSIS — Z170 Estrogen receptor positive status [ER+]: Secondary | ICD-10-CM | POA: Diagnosis present

## 2024-10-20 LAB — RAD ONC ARIA SESSION SUMMARY
Course Elapsed Days: 40
Plan Fractions Treated to Date: 8
Plan Fractions Treated to Date: 8
Plan Prescribed Dose Per Fraction: 1.8 Gy
Plan Prescribed Dose Per Fraction: 1.8 Gy
Plan Total Fractions Prescribed: 12
Plan Total Fractions Prescribed: 12
Plan Total Prescribed Dose: 21.6 Gy
Plan Total Prescribed Dose: 21.6 Gy
Reference Point Dosage Given to Date: 43.2 Gy
Reference Point Dosage Given to Date: 43.2 Gy
Reference Point Session Dosage Given: 1.8 Gy
Reference Point Session Dosage Given: 1.8 Gy
Session Number: 24

## 2024-10-21 ENCOUNTER — Ambulatory Visit

## 2024-10-21 ENCOUNTER — Inpatient Hospital Stay

## 2024-10-21 ENCOUNTER — Other Ambulatory Visit: Payer: Self-pay

## 2024-10-21 ENCOUNTER — Ambulatory Visit
Admission: RE | Admit: 2024-10-21 | Discharge: 2024-10-21 | Disposition: A | Source: Ambulatory Visit | Attending: Radiation Oncology

## 2024-10-21 DIAGNOSIS — Z510 Encounter for antineoplastic radiation therapy: Secondary | ICD-10-CM | POA: Diagnosis not present

## 2024-10-21 LAB — RAD ONC ARIA SESSION SUMMARY
Course Elapsed Days: 41
Plan Fractions Treated to Date: 9
Plan Fractions Treated to Date: 9
Plan Prescribed Dose Per Fraction: 1.8 Gy
Plan Prescribed Dose Per Fraction: 1.8 Gy
Plan Total Fractions Prescribed: 12
Plan Total Fractions Prescribed: 12
Plan Total Prescribed Dose: 21.6 Gy
Plan Total Prescribed Dose: 21.6 Gy
Reference Point Dosage Given to Date: 45 Gy
Reference Point Dosage Given to Date: 45 Gy
Reference Point Session Dosage Given: 1.8 Gy
Reference Point Session Dosage Given: 1.8 Gy
Session Number: 25

## 2024-10-22 ENCOUNTER — Other Ambulatory Visit: Payer: Self-pay

## 2024-10-22 ENCOUNTER — Ambulatory Visit
Admission: RE | Admit: 2024-10-22 | Discharge: 2024-10-22 | Disposition: A | Source: Ambulatory Visit | Attending: Radiation Oncology

## 2024-10-22 ENCOUNTER — Ambulatory Visit

## 2024-10-22 DIAGNOSIS — Z510 Encounter for antineoplastic radiation therapy: Secondary | ICD-10-CM | POA: Diagnosis not present

## 2024-10-22 LAB — RAD ONC ARIA SESSION SUMMARY
Course Elapsed Days: 42
Plan Fractions Treated to Date: 10
Plan Fractions Treated to Date: 10
Plan Prescribed Dose Per Fraction: 1.8 Gy
Plan Prescribed Dose Per Fraction: 1.8 Gy
Plan Total Fractions Prescribed: 12
Plan Total Fractions Prescribed: 12
Plan Total Prescribed Dose: 21.6 Gy
Plan Total Prescribed Dose: 21.6 Gy
Reference Point Dosage Given to Date: 46.8 Gy
Reference Point Dosage Given to Date: 46.8 Gy
Reference Point Session Dosage Given: 1.8 Gy
Reference Point Session Dosage Given: 1.8 Gy
Session Number: 26

## 2024-10-23 ENCOUNTER — Ambulatory Visit

## 2024-10-23 ENCOUNTER — Inpatient Hospital Stay

## 2024-10-23 ENCOUNTER — Ambulatory Visit
Admission: RE | Admit: 2024-10-23 | Discharge: 2024-10-23 | Disposition: A | Source: Ambulatory Visit | Attending: Radiation Oncology

## 2024-10-23 ENCOUNTER — Other Ambulatory Visit: Payer: Self-pay

## 2024-10-23 DIAGNOSIS — Z510 Encounter for antineoplastic radiation therapy: Secondary | ICD-10-CM | POA: Diagnosis not present

## 2024-10-23 LAB — RAD ONC ARIA SESSION SUMMARY
Course Elapsed Days: 43
Plan Fractions Treated to Date: 11
Plan Fractions Treated to Date: 11
Plan Prescribed Dose Per Fraction: 1.8 Gy
Plan Prescribed Dose Per Fraction: 1.8 Gy
Plan Total Fractions Prescribed: 12
Plan Total Fractions Prescribed: 12
Plan Total Prescribed Dose: 21.6 Gy
Plan Total Prescribed Dose: 21.6 Gy
Reference Point Dosage Given to Date: 48.6 Gy
Reference Point Dosage Given to Date: 48.6 Gy
Reference Point Session Dosage Given: 1.8 Gy
Reference Point Session Dosage Given: 1.8 Gy
Session Number: 27

## 2024-10-24 ENCOUNTER — Other Ambulatory Visit: Payer: Self-pay

## 2024-10-24 ENCOUNTER — Inpatient Hospital Stay

## 2024-10-24 ENCOUNTER — Ambulatory Visit

## 2024-10-24 ENCOUNTER — Ambulatory Visit: Admission: RE | Admit: 2024-10-24 | Discharge: 2024-10-24 | Attending: Radiation Oncology

## 2024-10-24 ENCOUNTER — Other Ambulatory Visit: Payer: Self-pay | Admitting: Radiation Oncology

## 2024-10-24 DIAGNOSIS — Z510 Encounter for antineoplastic radiation therapy: Secondary | ICD-10-CM | POA: Diagnosis not present

## 2024-10-24 LAB — RAD ONC ARIA SESSION SUMMARY
Course Elapsed Days: 44
Plan Fractions Treated to Date: 12
Plan Fractions Treated to Date: 12
Plan Prescribed Dose Per Fraction: 1.8 Gy
Plan Prescribed Dose Per Fraction: 1.8 Gy
Plan Total Fractions Prescribed: 12
Plan Total Fractions Prescribed: 12
Plan Total Prescribed Dose: 21.6 Gy
Plan Total Prescribed Dose: 21.6 Gy
Reference Point Dosage Given to Date: 50.4 Gy
Reference Point Dosage Given to Date: 50.4 Gy
Reference Point Session Dosage Given: 1.8 Gy
Reference Point Session Dosage Given: 1.8 Gy
Session Number: 28

## 2024-10-24 MED ORDER — OXYCODONE HCL 5 MG PO TABS: 5.0000 mg | ORAL_TABLET | Freq: Four times a day (QID) | ORAL | 0 refills | Status: AC | PRN

## 2024-10-27 ENCOUNTER — Other Ambulatory Visit: Payer: Self-pay

## 2024-10-27 ENCOUNTER — Ambulatory Visit

## 2024-10-27 ENCOUNTER — Inpatient Hospital Stay

## 2024-10-27 DIAGNOSIS — Z510 Encounter for antineoplastic radiation therapy: Secondary | ICD-10-CM | POA: Diagnosis not present

## 2024-10-27 LAB — RAD ONC ARIA SESSION SUMMARY
Course Elapsed Days: 47
Plan Fractions Treated to Date: 1
Plan Prescribed Dose Per Fraction: 2 Gy
Plan Total Fractions Prescribed: 5
Plan Total Prescribed Dose: 10 Gy
Reference Point Dosage Given to Date: 2 Gy
Reference Point Session Dosage Given: 2 Gy
Session Number: 29

## 2024-10-28 ENCOUNTER — Ambulatory Visit
Admission: RE | Admit: 2024-10-28 | Discharge: 2024-10-28 | Disposition: A | Source: Ambulatory Visit | Attending: Radiation Oncology

## 2024-10-28 ENCOUNTER — Ambulatory Visit

## 2024-10-28 ENCOUNTER — Inpatient Hospital Stay

## 2024-10-28 ENCOUNTER — Other Ambulatory Visit: Payer: Self-pay

## 2024-10-28 DIAGNOSIS — Z510 Encounter for antineoplastic radiation therapy: Secondary | ICD-10-CM | POA: Diagnosis not present

## 2024-10-28 LAB — RAD ONC ARIA SESSION SUMMARY
Course Elapsed Days: 48
Plan Fractions Treated to Date: 2
Plan Prescribed Dose Per Fraction: 2 Gy
Plan Total Fractions Prescribed: 5
Plan Total Prescribed Dose: 10 Gy
Reference Point Dosage Given to Date: 4 Gy
Reference Point Session Dosage Given: 2 Gy
Session Number: 30

## 2024-10-29 ENCOUNTER — Other Ambulatory Visit: Payer: Self-pay

## 2024-10-29 ENCOUNTER — Inpatient Hospital Stay

## 2024-10-29 ENCOUNTER — Ambulatory Visit
Admission: RE | Admit: 2024-10-29 | Discharge: 2024-10-29 | Disposition: A | Discharge status: Home / Self Care | Source: Ambulatory Visit | Attending: Radiation Oncology | Admitting: Radiation Oncology

## 2024-10-29 ENCOUNTER — Ambulatory Visit

## 2024-10-29 DIAGNOSIS — Z510 Encounter for antineoplastic radiation therapy: Secondary | ICD-10-CM | POA: Diagnosis not present

## 2024-10-29 LAB — RAD ONC ARIA SESSION SUMMARY
Course Elapsed Days: 49
Plan Fractions Treated to Date: 3
Plan Prescribed Dose Per Fraction: 2 Gy
Plan Total Fractions Prescribed: 5
Plan Total Prescribed Dose: 10 Gy
Reference Point Dosage Given to Date: 6 Gy
Reference Point Session Dosage Given: 2 Gy
Session Number: 31

## 2024-10-30 ENCOUNTER — Ambulatory Visit
Admission: RE | Admit: 2024-10-30 | Discharge: 2024-10-30 | Disposition: A | Discharge status: Home / Self Care | Source: Ambulatory Visit | Attending: Radiation Oncology | Admitting: Radiation Oncology

## 2024-10-30 ENCOUNTER — Ambulatory Visit

## 2024-10-30 ENCOUNTER — Inpatient Hospital Stay

## 2024-10-30 ENCOUNTER — Other Ambulatory Visit: Payer: Self-pay

## 2024-10-30 DIAGNOSIS — Z510 Encounter for antineoplastic radiation therapy: Secondary | ICD-10-CM | POA: Diagnosis not present

## 2024-10-30 LAB — RAD ONC ARIA SESSION SUMMARY
Course Elapsed Days: 50
Plan Fractions Treated to Date: 4
Plan Prescribed Dose Per Fraction: 2 Gy
Plan Total Fractions Prescribed: 5
Plan Total Prescribed Dose: 10 Gy
Reference Point Dosage Given to Date: 8 Gy
Reference Point Session Dosage Given: 2 Gy
Session Number: 32

## 2024-10-31 ENCOUNTER — Ambulatory Visit
Admission: RE | Admit: 2024-10-31 | Discharge: 2024-10-31 | Disposition: A | Source: Ambulatory Visit | Attending: Radiation Oncology

## 2024-10-31 ENCOUNTER — Other Ambulatory Visit: Payer: Self-pay

## 2024-10-31 ENCOUNTER — Inpatient Hospital Stay

## 2024-10-31 ENCOUNTER — Ambulatory Visit

## 2024-10-31 DIAGNOSIS — Z510 Encounter for antineoplastic radiation therapy: Secondary | ICD-10-CM | POA: Diagnosis not present

## 2024-10-31 LAB — RAD ONC ARIA SESSION SUMMARY
Course Elapsed Days: 51
Plan Fractions Treated to Date: 5
Plan Prescribed Dose Per Fraction: 2 Gy
Plan Total Fractions Prescribed: 5
Plan Total Prescribed Dose: 10 Gy
Reference Point Dosage Given to Date: 10 Gy
Reference Point Session Dosage Given: 2 Gy
Session Number: 33

## 2024-11-03 ENCOUNTER — Other Ambulatory Visit: Payer: Self-pay | Admitting: Family

## 2024-11-03 ENCOUNTER — Ambulatory Visit

## 2024-11-03 DIAGNOSIS — J4540 Moderate persistent asthma, uncomplicated: Secondary | ICD-10-CM

## 2024-11-03 NOTE — Radiation Completion Notes (Addendum)
" °  Radiation Oncology         [phone] ________________________________  Name: Janet [name] MRN: [id number]  Date of Service: 10/31/2024  DOB: 1966/06/14  End of Treatment Note  Diagnosis: Stage IB, pT3N1M0, grade 2, ER/PR positive invasive papillary carcinoma of the left breast.   Intent: Curative     ==========DELIVERED PLANS==========  First Treatment Date: 2024-09-10 Last Treatment Date: 2024-10-31   Plan Name: Breast_L_BH Site: Breast, Left Technique: 3D Mode: Photon Dose Per Fraction: 1.8 Gy Prescribed Dose (Delivered / Prescribed): 28.8 Gy / 28.8 Gy Prescribed Fxs (Delivered / Prescribed): 16 / 16   Plan Name: Breast_L_BH:1 Site: Breast, Left Technique: 3D Mode: Photon Dose Per Fraction: 1.8 Gy Prescribed Dose (Delivered / Prescribed): 21.6 Gy / 21.6 Gy Prescribed Fxs (Delivered / Prescribed): 12 / 12   Plan Name: SCV_L_PAB_BH Site: Sclav-LT Technique: 3D Mode: Photon Dose Per Fraction: 1.8 Gy Prescribed Dose (Delivered / Prescribed): 28.8 Gy / 28.8 Gy Prescribed Fxs (Delivered / Prescribed): 16 / 16   Plan Name: SCV_L_PAB_BH:1 Site: Sclav-LT Technique: 3D Mode: Photon Dose Per Fraction: 1.8 Gy Prescribed Dose (Delivered / Prescribed): 21.6 Gy / 21.6 Gy Prescribed Fxs (Delivered / Prescribed): 12 / 12   Plan Name: Brst_L_Bst_BH Site: Breast, Left Technique: 3D Mode: Photon Dose Per Fraction: 2 Gy Prescribed Dose (Delivered / Prescribed): 10 Gy / 10 Gy Prescribed Fxs (Delivered / Prescribed): 5 / 5     ==========ON TREATMENT VISIT DATES========== 2024-09-12, 2024-09-19, 2024-09-26, 2024-10-03, 2024-10-15, 2024-10-24, 2024-10-31      See weekly On Treatment Notes in Epic for details in the Media tab (listed as Progress notes on the On Treatment Visit Dates listed above). The patient tolerated radiation. She developed fatigue and anticipated skin changes in the treatment field.   The patient will receive a call in about one month from the  radiation oncology department. She will continue follow up with Dr. Loretha as well.      Donald KYM Husband, PAC     "

## 2024-11-03 NOTE — Telephone Encounter (Unsigned)
 Copied from CRM #[id number]. Topic: Clinical - Medication Refill >> Nov 03, 2024 12:53 PM Delon T wrote: Medication: montelukast  (SINGULAIR ) 10 MG tablet  Has the patient contacted their pharmacy? Yes (Agent: If no, request that the patient contact the pharmacy for the refill. If patient does not wish to contact the pharmacy document the reason why and proceed with request.) (Agent: If yes, when and what did the pharmacy advise?)  This is the patient's preferred pharmacy:   [facility]- [phone]    fax-[phone] [address], [place], [place] [address]  Is this the correct pharmacy for this prescription? Yes If no, delete pharmacy and type the correct one.   Has the prescription been filled recently? Yes  Is the patient out of the medication? Yes  Has the patient been seen for an appointment in the last year OR does the patient have an upcoming appointment? Yes  Can we respond through MyChart? Yes  Agent: Please be advised that Rx refills may take up to 3 business days. We ask that you follow-up with your pharmacy.

## 2024-11-06 NOTE — Telephone Encounter (Signed)
 Requested medication (s) are due for refill today: yes  Requested medication (s) are on the active medication list: yes  Last refill:  09/24/24  Future visit scheduled: no  Notes to clinic:  another provider listed as PCP, routing for review.     Requested Prescriptions  Pending Prescriptions Disp Refills   montelukast  (SINGULAIR ) 10 MG tablet 90 tablet 0    Sig: Take 1 tablet (10 mg total) by mouth at bedtime.     Pulmonology:  Leukotriene Inhibitors Passed - 11/06/2024  8:55 AM      Passed - Valid encounter within last 12 months    Recent Outpatient Visits           10 months ago Hospital discharge follow-up   [facility], [place] J, NP   1 year ago Moderate persistent asthma with acute exacerbation   [place] [facility], MD   1 year ago Encounter for screening mammogram for malignant neoplasm of breast   [place] [facility], [place], NP   1 year ago Annual physical exam   [place] [facility], Amy J, NP   2 years ago Lipoma of right upper extremity   [place] [facility], Greig PARAS, NP

## 2024-11-18 ENCOUNTER — Other Ambulatory Visit (HOSPITAL_COMMUNITY): Payer: Self-pay

## 2024-11-18 ENCOUNTER — Inpatient Hospital Stay (HOSPITAL_BASED_OUTPATIENT_CLINIC_OR_DEPARTMENT_OTHER): Admitting: Hematology and Oncology

## 2024-11-18 ENCOUNTER — Inpatient Hospital Stay

## 2024-11-18 ENCOUNTER — Telehealth: Payer: Self-pay | Admitting: Pharmacist

## 2024-11-18 ENCOUNTER — Encounter: Payer: Self-pay | Admitting: Hematology and Oncology

## 2024-11-18 ENCOUNTER — Telehealth: Payer: Self-pay

## 2024-11-18 VITALS — BP 118/75 | HR 68 | Temp 97.8°F | Resp 18 | Wt 268.7 lb

## 2024-11-18 DIAGNOSIS — C50212 Malignant neoplasm of upper-inner quadrant of left female breast: Secondary | ICD-10-CM

## 2024-11-18 DIAGNOSIS — Z170 Estrogen receptor positive status [ER+]: Secondary | ICD-10-CM

## 2024-11-18 DIAGNOSIS — Z510 Encounter for antineoplastic radiation therapy: Secondary | ICD-10-CM | POA: Diagnosis not present

## 2024-11-18 MED ORDER — GABAPENTIN 300 MG PO CAPS: 300.0000 mg | ORAL_CAPSULE | Freq: Every day | ORAL | 1 refills | Status: DC

## 2024-11-18 MED ORDER — ANASTROZOLE 1 MG PO TABS: 1.0000 mg | ORAL_TABLET | Freq: Every day | ORAL | 3 refills | Status: AC

## 2024-11-18 MED ORDER — ABEMACICLIB 100 MG PO TABS
100.0000 mg | ORAL_TABLET | Freq: Two times a day (BID) | ORAL | 3 refills | Status: AC
  Filled 2024-12-02: qty 56, 28d supply, fill #0
  Filled 2024-12-23: qty 56, 28d supply, fill #1

## 2024-11-18 NOTE — Telephone Encounter (Signed)
 Oral Oncology Patient Advocate Encounter   Received notification that prior authorization for Verzenio is required.   PA submitted on 11/18/2024 Key [id number] Status is pending      Charlott Hamilton,  CPhT-Adv  she/her/hers [facility]  [facility] Specialty Pharmacy Services Pharmacy Technician Patient Advocate Specialist III [facility] Phone: [phone]  Fax: [phone] [email or web address]

## 2024-11-18 NOTE — Telephone Encounter (Signed)
 [facility]        Telephone: [phone]?Fax: [phone]   Oncology Clinical Pharmacist Practitioner Encounter   Received new prescription for abemaciclib for the treatment of breast cancer. This is being given in combination with anastrozole. It is planned to continue until two years in the adjuvant setting per the monarchE trial data.  Labs will be done prior to starting at initial education visit.  Prescription dose and frequency assessed.   Current medication list in Epic reviewed. Significant DDIs with abemaciclib identified:No. Will review possible DDIs at education visit.  Evaluated chart. Patient barriers to medication adherence identified: No.  Patient agreement for treatment documented in physician note on 11/18/24.  Prescription has been e-scribed to the [facility]) for benefits analysis and approval.  Oral Oncology Clinic will continue to follow for insurance authorization, copayment issues, initial counseling and start date.  Majesty Oehlert A. Lucila, PharmD, BCOP, CPP Hematology-Oncology Clinical Pharmacist Practitioner  11/18/2024 4:08 PM  **Disclaimer: This note was dictated with voice recognition software. Similar sounding words can inadvertently be transcribed and this note may contain transcription errors which may not have been corrected upon publication of note.**

## 2024-11-18 NOTE — Telephone Encounter (Signed)
 I left voicemail for patient regarding lab and pharmacy clinic visit per 11/18/2024 los. I advised patient to return my call if appointment date/time does not work for her schedule.

## 2024-11-18 NOTE — Telephone Encounter (Signed)
 Oral Oncology Patient Advocate Encounter  Prior Authorization for Verzenio has been approved.    PA# [id number] Effective dates: 11/18/2024 through 11/19/2025  Patients co-pay is $0.00     Charlott Hamilton,  CPhT-Adv  she/her/hers [facility]  [facility] Specialty Pharmacy Services Pharmacy Technician Patient Advocate Specialist III [facility] Phone: [phone]  Fax: [phone] [email or web address]

## 2024-11-18 NOTE — Progress Notes (Signed)
 " [facility] [phone]   [phone]  PROGRESS NOTE  Patient Care Team: Laurice President, NP as PCP - General (Nurse Practitioner) Nahser, Aleene PARAS, MD (Inactive) as PCP - Cardiology (Cardiology) Nahser, Aleene PARAS, MD (Inactive) as Consulting Physician (Cardiology) Tyree Nanetta SAILOR, RN as Oncology Nurse Navigator Loretha Ash, MD as Consulting Physician (Hematology and Oncology)   CHIEF COMPLAINTS/PURPOSE OF CONSULTATION:  Left breast cancer  Oncology History  Malignant neoplasm of upper-inner quadrant of left breast in female, estrogen receptor positive (HCC)  10/11/2023 Mammogram   There is a dominant solid mass at the palpable site of concern in the left breast at 6 o'clock measuring 4.2 cm. There are multiple other smaller solid-appearing masses in the left breast at 8 o'clock, 9 o'clock and 10 o'clock. No evidence of left axillary lymphadenopathy. No evidence of malignancy in the right breast.   03/11/2024 Initial Diagnosis   Malignant neoplasm of upper-inner quadrant of left breast in female, estrogen receptor positive (HCC)   03/11/2024 Cancer Staging   Staging form: Breast, AJCC 8th Edition - Pathologic: Stage IB (pT3, pN1(sn), cM0, G2, ER+, PR+, HER2-) - Signed by Loretha Ash, MD on 05/09/2024 Method of lymph node assessment: Sentinel lymph node biopsy Histologic grading system: 3 grade system   03/13/2024 Pathology Results   Left lumpectomy  Papillary carcinoma with invasion, 5.1 cm, grade 2  Ductal carcinoma in situ: Cribriform, nuclear grade 2  Margins, invasive: Positive      Closest, invasive: Posterior and medial  Margins, DCIS: Negative      Closest, DCIS: 1 mm, superior and anterior  Lymphovascular invasion: Not identified   The tumor cells are NEGATIVE for Her2 (1+).   Estrogen Receptor:       95%, POSTIVE, STRONG STAINING INTENSITY  Progesterone Receptor:   10%, POSITIVE, MODERATE STAINING INTENSITY  Proliferation  Marker Ki-67:   20%    05/21/2024 -  Chemotherapy   Patient is on Treatment Plan : BREAST TC q21d      Genetic Testing   [facility] CancerNext-Expanded Panel+RNA was Negative. Of note, a variant of uncertain significance was detected in the GATA2 gene (p.A342T). Report date is 05/20/2024.  The CancerNext-Expanded gene panel offered by [facility] and includes sequencing, rearrangement, and RNA analysis for the following 77 genes: AIP, ALK, APC, ATM, AXIN2, BAP1, BARD1, BMPR1A, BRCA1, BRCA2, BRIP1, CDC73, CDH1, CDK4, CDKN1B, CDKN2A, CEBPA, CHEK2, CTNNA1, DDX41, DICER1, ETV6, FH, FLCN, GATA2, LZTR1, MAX, MBD4, MEN1, MET, MLH1, MSH2, MSH3, MSH6, MUTYH, NF1, NF2, NTHL1, PALB2, PHOX2B, PMS2, POT1, PRKAR1A, PTCH1, PTEN, RAD51C, RAD51D, RB1, RET, RPS20, RUNX1, SDHA, SDHAF2, SDHB, SDHC, SDHD, SMAD4, SMARCA4, SMARCB1, SMARCE1, STK11, SUFU, TMEM127, TP53, TSC1, TSC2, VHL, and WT1 (sequencing and deletion/duplication); EGFR, HOXB13, KIT, MITF, PDGFRA, POLD1, and POLE (sequencing only); EPCAM and GREM1 (deletion/duplication only).     08/17/2024 Cancer Staging   Staging form: Breast, AJCC 8th Edition - Pathologic stage from 08/17/2024: Stage IB (pT3, pN1, cM0, G2, ER+, PR+, HER2-) - Signed by Lanell Donald Stagger, PA-C on 08/17/2024 Stage prefix: Initial diagnosis Multigene prognostic tests performed: None Histologic grading system: 3 grade system    CURRENT TREATMENT: Taxotere  and Cytoxan   INTERVAL HISTORY:  [name] 58 y.o. female Ms. Samons returns for toxicity check prior to cycle 3-day 1 of TC chemotherapy.   Discussed the use of AI scribe software for clinical note transcription with the patient, who gave verbal consent to proceed.  History of Present Illness  Janet [name] is a  58 year old female with estrogen receptor positive left breast cancer with nodal involvement and high oncotype score who presents for oncology follow-up and initiation of adjuvant endocrine and CDK4/6  inhibitor therapy.  She has completed chemotherapy and radiation therapy. She reports ongoing cutaneous changes at the radiation site, including persistent hyperpigmentation, desquamation, intermittent sharp pain, and decreasing edema. These symptoms are gradually improving. She denies systemic symptoms such as fever or malaise.  She continues to experience mild, intermittent tingling consistent with chemotherapy-induced peripheral neuropathy, which is improving. She had pre-existing neuropathy prior to chemotherapy. She is currently out of gabapentin  and requested a refill for symptomatic management.  She has a history of hysterectomy. She was previously uncertain of her menopausal status.  Rest of the pertinent 10 ROS reviewed and neg  MEDICAL HISTORY:  Past Medical History:  Diagnosis Date   Allergy    Anxiety    Arthritis    Asthma    Breast cancer (HCC) 02/28/2024   CHF (congestive heart failure) (HCC)    Congestive heart failure (CHF) (HCC)    Depression    Elevated brain natriuretic peptide (BNP) level    Hypertension    Lymphedema    Nonischemic cardiomyopathy (HCC) 03/2021   Pre-diabetes    SOB (shortness of breath)    Substance abuse (HCC)     SURGICAL HISTORY: Past Surgical History:  Procedure Laterality Date   ABDOMINAL HYSTERECTOMY     AXILLARY SENTINEL NODE BIOPSY  04/08/2024   Procedure: BIOPSY, LYMPH NODE, SENTINEL, AXILLARY;  Surgeon: Vanderbilt Ned, MD;  Location: [facility] OR;  Service: General;;  LEFT SENTINEL LYMPH NODE MAPPING AND BIOPSY X 3   BREAST BIOPSY Right    BREAST BIOPSY Left 10/16/2023   times 3   BREAST BIOPSY Left 10/16/2023   US  LT BREAST BX W LOC DEV EA ADD LESION IMG BX SPEC US  GUIDE 10/16/2023 [facility] M[facility]MOGRAPHY   BREAST BIOPSY Left 10/16/2023   US  LT BREAST BX W LOC DEV 1ST LESION IMG BX SPEC US  GUIDE 10/16/2023 [facility] MAMMOGRAPHY   BREAST BIOPSY Left 10/16/2023   US  LT BREAST BX W LOC DEV EA ADD LESION IMG BX SPEC US  GUIDE 10/16/2023 [facility]  MAMMOGRAPHY   BREAST BIOPSY Left 02/27/2024   US  LT RADIOACTIVE SEED LOC 02/27/2024 [facility] MAMMOGRAPHY   BREAST BIOPSY Left 02/27/2024   US  LT RADIOACTIVE SEED EA ADD LESION 02/27/2024 [facility] MAMMOGRAPHY   BREAST LUMPECTOMY Left 04/08/2024   Procedure: BREAST LUMPECTOMY;  Surgeon: Vanderbilt Ned, MD;  Location: [facility] OR;  Service: General;  Laterality: Left;  LEFT BREAST RE-EXCISION LUMPECTOMY, LEFT SENTINEL LYMPH NODE MAPPING   BREAST LUMPECTOMY WITH RADIOACTIVE SEED LOCALIZATION Left 02/28/2024   Procedure: LEFT BREAST SEED LUMPECTOMY;  Surgeon: Vanderbilt Ned, MD;  Location: [facility] OR;  Service: General;  Laterality: Left;  3 seeds   CESAREAN SECTION     COLONOSCOPY WITH PROPOFOL  N/A 12/10/2023   Procedure: COLONOSCOPY WITH PROPOFOL ;  Surgeon: Leigh Elspeth SQUIBB, MD;  Location: [facility] ENDOS[facility];  Service: Gastroenterology;  Laterality: N/A;   HEMOSTASIS CLIP PLACEMENT  12/10/2023   Procedure: HEMOSTASIS CLIP PLACEMENT;  Surgeon: Leigh Elspeth SQUIBB, MD;  Location: [facility] ENDOS[facility];  Service: Gastroenterology;;   HERNIA REPAIR     umbilical as a child   POLYPECTOMY  12/10/2023   Procedure: POLYPECTOMY;  Surgeon: Leigh Elspeth SQUIBB, MD;  Location: [facility];  Service: Gastroenterology;;   RIGHT/LEFT HEART CATH AND CORONARY ANGIOGRAPHY N/A 04/01/2021   Procedure: RIGHT/LEFT HEART CATH AND CORONARY ANGIOGRAPHY;  Surgeon: Dann Candyce RAMAN, MD;  Location: [facility] INVASIVE CV LAB;  Service: Cardiovascular;  Laterality: N/A;   TUBAL LIGATION      SOCIAL HISTORY: Social History   Socioeconomic History   Marital status: Single    Spouse name: Not on file   Number of children: 2   Years of education: 14   Highest education level: Associate degree: academic program  Occupational History   Occupation: Consulting Civil Engineer   Occupation: disabled  Tobacco Use   Smoking status: Never    Passive exposure: Current   Smokeless tobacco: Never  Vaping Use   Vaping status: Never Used  Substance and Sexual Activity   Alcohol  use: Not Currently    Comment: occ   Drug use: Yes    Frequency: 1.0 times per week    Types: Marijuana    Comment: Pt has not sued cocaine in 6 months ( as of 04/07/24) twice a week   Sexual activity: Not Currently    Birth control/protection: Surgical  Other Topics Concern   Not on file  Social History Narrative   Not on file   Social Drivers of Health   Tobacco Use: Medium Risk (09/02/2024)   Patient History    Smoking Tobacco Use: Never    Smokeless Tobacco Use: Never    Passive Exposure: Current  Financial Resource Strain: Low Risk (01/03/2024)   Overall Financial Resource Strain (CARDIA)    Difficulty of Paying Living Expenses: Not very hard  Food Insecurity: No Food Insecurity (08/20/2024)   Epic    Worried About Programme Researcher, Broadcasting/film/video in the Last Year: Never true    Ran Out of Food in the Last Year: Never true  Transportation Needs: No Transportation Needs (08/20/2024)   Epic    Lack of Transportation (Medical): No    Lack of Transportation (Non-Medical): No  Physical Activity: Inactive (01/03/2024)   Exercise Vital Sign    Days of Exercise per Week: 0 days    Minutes of Exercise per Session: 0 min  Stress: Stress Concern Present (01/03/2024)   [employer] of Occupational Health - Occupational Stress Questionnaire    Feeling of Stress : Very much  Social Connections: Socially Isolated (01/03/2024)   Social Connection and Isolation Panel    Frequency of Communication with Friends and Family: More than three times a week    Frequency of Social Gatherings with Friends and Family: Once a week    Attends Religious Services: Never    Database Administrator or Organizations: No    Attends Engineer, Structural: Not on file    Marital Status: Never married  Intimate Partner Violence: Not At Risk (08/20/2024)   Epic    Fear of Current or Ex-Partner: No    Emotionally Abused: No    Physically Abused: No    Sexually Abused: No  Depression (PHQ2-9): Low Risk  (08/20/2024)   Depression (PHQ2-9)    PHQ-2 Score: 1  Recent Concern: Depression (PHQ2-9) - Medium Risk (07/04/2024)   Depression (PHQ2-9)    PHQ-2 Score: 6  Alcohol Screen: Low Risk (01/03/2024)   Alcohol Screen    Last Alcohol Screening Score (AUDIT): 1  Housing: Unknown (08/20/2024)   Epic    Unable to Pay for Housing in the Last Year: No    Number of Times Moved in the Last Year: Not on file    Homeless in the Last Year: No  Utilities: Not At Risk (08/20/2024)   Epic    Threatened with loss of utilities: No  Health Literacy: Adequate Health Literacy (01/03/2024)   B1300 Health Literacy    Frequency of need for help with medical instructions: Never    FAMILY HISTORY: Family History  Problem Relation Age of Onset   Colon polyps Mother    Hypertension Mother    Stroke Mother    Deep vein thrombosis Mother    Congestive Heart Failure Mother    Arthritis Mother    Asthma Mother    Depression Mother    Other Father        history unknown   Obesity Sister    Diabetes Sister    Obesity Brother    Lung cancer Maternal Uncle        smoked   Congestive Heart Failure Maternal Grandmother    Throat cancer Maternal Grandfather        smoked   Colon cancer Neg Hx    Esophageal cancer Neg Hx    Rectal cancer Neg Hx    Stomach cancer Neg Hx     ALLERGIES:  is allergic to clarithromycin, penicillins, and penicillin g.  MEDICATIONS:  Current Outpatient Medications  Medication Sig Dispense Refill   albuterol  (PROVENTIL ) (2.5 MG/3ML) 0.083% nebulizer solution Take 3 mLs (2.5 mg total) by nebulization every 4 (four) hours as needed for wheezing or shortness of breath. 75 mL 2   albuterol  (VENTOLIN  HFA) 108 (90 Base) MCG/ACT inhaler Inhale 2 puffs into the lungs every 6 (six) hours as needed for wheezing or shortness of breath. 18 g 2   Budeson-Glycopyrrol-Formoterol  (BREZTRI  AEROSPHERE) 160-9-4.8 MCG/ACT AERO Inhale 2 puffs into the lungs in the morning and at bedtime. 10.7 g 5    carvedilol  (COREG ) 12.5 MG tablet Take 1 tablet (12.5 mg total) by mouth 2 (two) times daily with a meal. 60 tablet 11   dapagliflozin  propanediol (FARXIGA ) 10 MG TABS tablet Take 1 tablet (10 mg total) by mouth daily before breakfast. 90 tablet 3   DULoxetine (CYMBALTA) 30 MG capsule Take 30 mg by mouth daily.     fluticasone  (FLONASE ) 50 MCG/ACT nasal spray Place 1 spray into both nostrils daily. 15.8 mL 0   furosemide  (LASIX ) 20 MG tablet TAKE ONE TABLET (20 MG) BY MOUTH DAILY AT 9AM 30 tablet 11   gabapentin  (NEURONTIN ) 300 MG capsule Take 1 capsule (300 mg total) by mouth at bedtime. 30 capsule 1   ibuprofen (ADVIL) 800 MG tablet Take 800 mg by mouth every 6 (six) hours as needed for fever, headache or mild pain (pain score 1-3).     melatonin 1 MG TABS tablet Take 1 mg by mouth at bedtime.     montelukast  (SINGULAIR ) 10 MG tablet Take 1 tablet (10 mg total) by mouth at bedtime. 90 tablet 0   Multiple Vitamin (MULTIVITAMIN) tablet Take 1 tablet by mouth daily.     oxyCODONE  (OXY IR/ROXICODONE ) 5 MG immediate release tablet Take 1 tablet (5 mg total) by mouth every 6 (six) hours as needed for severe pain (pain score 7-10). 15 tablet 0   sacubitril -valsartan  (ENTRESTO ) 97-103 MG Take 1 tablet by mouth 2 (two) times daily. 180 tablet 3   spironolactone  (ALDACTONE ) 25 MG tablet TAKE ONE TABLET (25 MG) BY MOUTH DAILY 90 tablet 11   traZODone  (DESYREL ) 150 MG tablet Take 150 mg by mouth at bedtime.     No current facility-administered medications for this visit.    REVIEW OF SYSTEMS:   Constitutional: ( - ) fevers, ( - )  chills , ( - )  night sweats Eyes: ( - ) blurriness of vision, ( - ) double vision, ( - ) watery eyes Ears, nose, mouth, throat, and face: ( - ) mucositis, ( - ) sore throat Respiratory: ( - ) cough, ( - ) dyspnea, ( - ) wheezes Cardiovascular: ( - ) palpitation, ( - ) chest discomfort, (+) lower extremity swelling Gastrointestinal:  ( - ) nausea, ( + ) heartburn, ( - ) change  in bowel habits Skin: ( - ) abnormal skin rashes Lymphatics: ( - ) new lymphadenopathy, ( - ) easy bruising Neurological: ( - ) numbness, ( - ) tingling, ( - ) new weaknesses Behavioral/Psych: ( - ) mood change, ( - ) new changes  All other systems were reviewed with the patient and are negative.  PHYSICAL EXAMINATION: ECOG PERFORMANCE STATUS: 1 - Symptomatic but completely ambulatory  Vitals:   11/18/24 1348  BP: 118/75  Pulse: 68  Resp: 18  Temp: 97.8 F (36.6 C)  SpO2: 97%    Filed Weights   11/18/24 1348  Weight: 268 lb 11.2 oz (121.9 kg)     GENERAL: well appearing female in NAD    LABORATORY DATA:  I have reviewed the data as listed    Latest Ref Rng & Units 10/08/2024    9:26 AM 07/25/2024   11:29 AM 07/04/2024    9:24 AM  CBC  WBC 4.0 - 10.5 K/uL 3.2  10.9  8.5   Hemoglobin 12.0 - 15.0 g/dL 86.3  87.8  87.9   Hematocrit 36.0 - 46.0 % 41.0  36.7  37.1   Platelets 150 - 400 K/uL 185  338  287        Latest Ref Rng & Units 07/25/2024   11:29 AM 07/04/2024    9:24 AM 06/13/2024   10:43 AM  CMP  Glucose 70 - 99 mg/dL 881  873  823   BUN 6 - 20 mg/dL 18  17  17    Creatinine 0.44 - 1.00 mg/dL 9.12  9.04  9.08   Sodium 135 - 145 mmol/L 139  141  139   Potassium 3.5 - 5.1 mmol/L 4.2  4.3  3.8   Chloride 98 - 111 mmol/L 107  109  108   CO2 22 - 32 mmol/L 27  25  24    Calcium 8.9 - 10.3 mg/dL 9.6  9.2  9.1   Total Protein 6.5 - 8.1 g/dL 6.6  6.3  6.5   Total Bilirubin 0.0 - 1.2 mg/dL 0.4  0.3  0.3   Alkaline Phos 38 - 126 U/L 58  60  65   AST 15 - 41 U/L 20  16  15    ALT 0 - 44 U/L 24  19  26     RADIOGRAPHIC STUDIES: I have personally reviewed the radiological images as listed and agreed with the findings in the report. No results found.   PATHOLOGY:  A. BREAST, LEFT, LUMPECTOMY: Papillary carcinoma with invasion, 5.1 cm, grade 2 Ductal carcinoma in situ: Cribriform, nuclear grade 2 Margins, invasive: Positive     Closest, invasive: Posterior and  medial Margins, DCIS: Negative     Closest, DCIS: 1 mm, superior and anterior Lymphovascular invasion: Not identified Prognostic markers:  ER pending, PR pending, Her2 pending, Ki-67 pending Other: Biopsy site and biopsy clip See oncology table Oncology table: INVASIVE CARCINOMA OF THE BREAST:  Resection Procedure: Left breast lumpectomy Specimen Laterality: Left breast Histologic Type: Papillary carcinoma with invasion Histologic Grade:  Glandular (Acinar)/Tubular Differentiation: 3      Nuclear Pleomorphism: 2      Mitotic Rate: 1      Overall Grade: 2 Tumor Size: 5.1 x 2.8 x 2 cm Ductal Carcinoma In Situ: Cribriform with intermediate nuclear grade Lymphatic and/or Vascular Invasion: Not identified Treatment Effect in the Breast: No known presurgical therapy Margins:      Distance from Closest Margin (mm): 0 mm      Specify Closest Margin (required only if <6mm): Posterior and medial DCIS Margins:      Distance from Closest Margin (mm): 1 mm      Specify Closest Margin (required only if <28mm): Superior and anterior Regional Lymph Nodes: No lymph nodes submitted] Distant Metastasis:      Distant Site(s) Involved: Not applicable    The tumor cells are NEGATIVE for Her2 (1+).   Estrogen Receptor:       95%, POSTIVE, STRONG STAINING INTENSITY  Progesterone Receptor:   10%, POSITIVE, MODERATE STAINING INTENSITY  Proliferation Marker Ki-67:   20%   ASSESSMENT & PLAN Janet [name] is a 58 y.o. female who presents to the clinic for continued management of left breast cancer.  #Malignant neoplasm of upper-inner quadrant of left breast in female, estrogen receptor positive: --Underwent left lumpectomy on 03/13/2024 with pathology review as above.  --High oncotype score of 42 suggests significant chemotherapy benefit. Five-year recurrence risk is 25%. Chemotherapy recommended due to oncotype score and lymph node involvement. Cardiac concerns necessitate reconsideration  of initial chemotherapy plan. Decision made to consider less intensive regimen due to cardiac dysfunction.  --Started TC chemotherapy on 05/21/2024.  PLAN: --Due for cycle 4, day 1 today.   Assessment and Plan Assessment & Plan  Estrogen receptor positive left breast cancer, status post radiation and chemotherapy Completed chemotherapy and radiation. Lymph node involvement and high oncotype score (42) increase recurrence risk, supporting adjuvant endocrine and CDK4/6 inhibitor therapy. Postmenopausal status confirmed eligibility for aromatase inhibitor therapy. - Prescribed anastrozole (or letrozole) 1 tablet daily for at least 5 years; discussed recurrence risk reduction and potential menopausal side effects. - Prescribed abemaciclib 100 mg twice daily for 2 years; discussed indication, potential gastrointestinal and other toxicities such as immunosuppression, transaminitis; will require monitoring. - Ordered bone density scan to monitor for aromatase inhibitor-associated bone loss. - Arranged follow-up every 8 weeks while on both medications, then every 6 months post-abemaciclib. - Instructed to complete laboratory studies on the same day as next follow-up with CPP  Chemotherapy-induced peripheral neuropathy Mild, intermittent tingling persists, improving. Pre-existing neuropathy may contribute to symptoms, which remain manageable. - Refilled gabapentin  prescription for use as needed.  Postmenopausal state Postmenopausal status confirmed by laboratory criteria and prior hysterectomy, supporting aromatase inhibitor therapy eligibility. - Confirmed postmenopausal status by laboratory evaluation.  All questions were answered. The patient knows to call the clinic with any problems, questions or concerns.  I have spent a total of 30 minutes minutes of face-to-face and non-face-to-face time, preparing to see the patient, performing a medically appropriate examination, counseling and educating  the patient, ordering medications/tests/procedures, rdocumenting clinical information in the electronic health record, independently interpreting results and communicating results to the patient, and care coordination.   "

## 2024-11-19 NOTE — Progress Notes (Signed)
" °  Radiation Oncology         [phone] ________________________________  Name: Janet [name] MRN: [id number]  Date of Service: 12/01/2024  DOB: [birth year]  Post Treatment Telephone Note  Diagnosis: Stage IB, [id number], grade 2, ER/PR positive invasive papillary carcinoma of the left breast.   First Treatment Date: 2024-09-10 Last Treatment Date: 2024-10-31   Plan Name: Breast_L_BH Site: Breast, Left Technique: 3D Mode: Photon Dose Per Fraction: 1.8 Gy Prescribed Dose (Delivered / Prescribed): 28.8 Gy / 28.8 Gy Prescribed Fxs (Delivered / Prescribed): 16 / 16   Plan Name: Breast_L_BH:1 Site: Breast, Left Technique: 3D Mode: Photon Dose Per Fraction: 1.8 Gy Prescribed Dose (Delivered / Prescribed): 21.6 Gy / 21.6 Gy Prescribed Fxs (Delivered / Prescribed): 12 / 12   Plan Name: SCV_L_PAB_BH Site: Sclav-LT Technique: 3D Mode: Photon Dose Per Fraction: 1.8 Gy Prescribed Dose (Delivered / Prescribed): 28.8 Gy / 28.8 Gy Prescribed Fxs (Delivered / Prescribed): 16 / 16   Plan Name: SCV_L_PAB_BH:1 Site: Sclav-LT Technique: 3D Mode: Photon Dose Per Fraction: 1.8 Gy Prescribed Dose (Delivered / Prescribed): 21.6 Gy / 21.6 Gy Prescribed Fxs (Delivered / Prescribed): 12 / 12   Plan Name: Brst_L_Bst_BH Site: Breast, Left Technique: 3D Mode: Photon Dose Per Fraction: 2 Gy Prescribed Dose (Delivered / Prescribed): 10 Gy / 10 Gy Prescribed Fxs (Delivered / Prescribed): 5 / 5    The patient was available for call today.   Symptoms of fatigue have improved since completing therapy.  Symptoms of skin changes have improved since completing therapy.  The patient was encouraged to avoid sun exposure in the area of prior treatment for up to one year following radiation with either sunscreen or by the style of clothing worn in the sun.  The patient has scheduled follow up with her medical oncologist Dr. Loretha for ongoing surveillance, and was encouraged to call if she  develops concerns or questions regarding radiation.   "

## 2024-11-28 ENCOUNTER — Other Ambulatory Visit (HOSPITAL_COMMUNITY): Payer: Self-pay

## 2024-11-28 ENCOUNTER — Other Ambulatory Visit: Payer: Self-pay

## 2024-11-28 NOTE — Telephone Encounter (Signed)
 Patient would like Thursday 10-04-25 delivery to Home Address. Copay for Verzen[employer]  on this date is $11.76.

## 2024-12-01 ENCOUNTER — Ambulatory Visit (HOSPITAL_COMMUNITY)
Admission: RE | Admit: 2024-12-01 | Discharge: 2024-12-01 | Disposition: A | Discharge status: Home / Self Care | Source: Ambulatory Visit | Attending: Emergency Medicine | Admitting: Emergency Medicine

## 2024-12-01 ENCOUNTER — Encounter (HOSPITAL_COMMUNITY): Payer: Self-pay

## 2024-12-01 ENCOUNTER — Ambulatory Visit
Admission: RE | Admit: 2024-12-01 | Discharge: 2024-12-01 | Disposition: A | Discharge status: Home / Self Care | Source: Ambulatory Visit | Attending: Radiation Oncology | Admitting: Radiation Oncology

## 2024-12-01 VITALS — BP 100/69 | HR 76 | Temp 99.4°F | Resp 17

## 2024-12-01 DIAGNOSIS — J029 Acute pharyngitis, unspecified: Secondary | ICD-10-CM | POA: Diagnosis not present

## 2024-12-01 DIAGNOSIS — Z170 Estrogen receptor positive status [ER+]: Secondary | ICD-10-CM

## 2024-12-01 DIAGNOSIS — H6592 Unspecified nonsuppurative otitis media, left ear: Secondary | ICD-10-CM

## 2024-12-01 LAB — POCT RAPID STREP A (OFFICE): Rapid Strep A Screen: NEGATIVE

## 2024-12-01 MED ORDER — CEFDINIR 300 MG PO CAPS: 300.0000 mg | ORAL_CAPSULE | Freq: Two times a day (BID) | ORAL | 0 refills | Status: AC

## 2024-12-01 MED ORDER — PREDNISONE 20 MG PO TABS: 40.0000 mg | ORAL_TABLET | Freq: Every day | ORAL | 0 refills | Status: AC

## 2024-12-01 NOTE — ED Provider Notes (Signed)
 " [facility]    CSN: [id number] Arrival date & time: 12/01/24  1600      History   Chief Complaint Chief Complaint  Patient presents with   Sore Throat    I think I have a sinus infection - Entered by patient    HPI Janet [name] is a 59 y.o. female.   Patient presents with sore throat particularly to the left side that began 9 days ago and has progressively worsened since then.  Patient states that she is now experiencing some pain to her left ear as well.  Patient states that she did have a mild cough a few days ago, but states that she no longer has any cough.    Patient denies any fever.  Patient reports that she did go to her primary care provider and they tested her for flu and strep which were both negative.  Patient reports that she has been taking over-the-counter cold medications and previously prescribed oxycodone  with minimal relief.  Patient denies any chest pain or shortness of breath.  Patient reports that she did have something similar to this in the past and went to the emergency department and was treated with an antibiotic and steroids which relieved her symptoms.  Upon chart review patient was treated with cefdinir  for 10 days during that visit.  Patient denies having any reaction to this medication despite history of penicillin allergy.  The history is provided by the patient and medical records.  Sore Throat    Past Medical History:  Diagnosis Date   Allergy    Anxiety    Arthritis    Asthma    Breast cancer (HCC) 02/28/2024   CHF (congestive heart failure) (HCC)    Congestive heart failure (CHF) (HCC)    Depression    Elevated brain natriuretic peptide (BNP) level    Hypertension    Lymphedema    Nonischemic cardiomyopathy (HCC) 03/2021   Pre-diabetes    SOB (shortness of breath)    Substance abuse [facility])     Patient Active Problem List   Diagnosis Date Noted   Genetic testing 05/26/2024   Malignant neoplasm of upper-inner  quadrant of left breast in female, estrogen receptor positive (HCC) 03/11/2024   Colon cancer screening 12/10/2023   Benign neoplasm of colon 12/10/2023   Vitamin D  deficiency 09/04/2023   Prediabetes 08/03/2022   Acute systolic heart failure (HCC)    Essential hypertension 02/17/2021   Asthma    Lymphedema    Elevated brain natriuretic peptide (BNP) level    DOE (dyspnea on exertion) 07/28/2020   Morbid obesity (HCC) 07/28/2020    Past Surgical History:  Procedure Laterality Date   ABDOMINAL HYSTERECTOMY     AXILLARY SENTINEL NODE BIOPSY  04/08/2024   Procedure: BIOPSY, LYMPH NODE, SENTINEL, AXILLARY;  Surgeon: Vanderbilt Ned, MD;  Location: [facility];  Service: General;;  LEFT SENTINEL LYMPH NODE MAPPING AND BIOPSY X 3   BREAST BIOPSY Right    BREAST BIOPSY Left 10/16/2023   times 3   BREAST BIOPSY Left 10/16/2023   US  LT BREAST BX W LOC DEV EA ADD LESION IMG BX SPEC US  GUIDE 10/16/2023 [facility]   BREAST BIOPSY Left 10/16/2023   US  LT BREAST BX W LOC DEV 1ST LESION IMG BX SPEC US  GUIDE 10/16/2023 [facility]   BREAST BIOPSY Left 10/16/2023   US  LT BREAST BX W LOC DEV EA ADD LESION IMG BX SPEC US  GUIDE 10/16/2023 [facility]   BREAST  BIOPSY Left 02/27/2024   US  LT RADIOACTIVE SEED LOC 02/27/2024 [facility]   BREAST BIOPSY Left 02/27/2024   US  LT RADIOACTIVE SEED EA ADD LESION 02/27/2024 [facility]   BREAST LUMPECTOMY Left 04/08/2024   Procedure: BREAST LUMPECTOMY;  Surgeon: Vanderbilt Ned, MD;  Location: [facility];  Service: General;  Laterality: Left;  LEFT BREAST RE-EXCISION LUMPECTOMY, LEFT SENTINEL LYMPH NODE MAPPING   BREAST LUMPECTOMY WITH RADIOACTIVE SEED LOCALIZATION Left 02/28/2024   Procedure: LEFT BREAST SEED LUMPECTOMY;  Surgeon: Vanderbilt Ned, MD;  Location: [facility];  Service: General;  Laterality: Left;  3 seeds   CESAREAN SECTION     COLONOSCOPY WITH PROPOFOL  N/A 12/10/2023   Procedure: COLONOSCOPY WITH PROPOFOL ;  Surgeon: Leigh Elspeth SQUIBB, MD;  Location: [facility];  Service: Gastroenterology;  Laterality: N/A;   HEMOSTASIS CLIP PLACEMENT  12/10/2023   Procedure: HEMOSTASIS CLIP PLACEMENT;  Surgeon: Leigh Elspeth SQUIBB, MD;  Location: [facility];  Service: Gastroenterology;;   HERNIA REPAIR     umbilical as a child   POLYPECTOMY  12/10/2023   Procedure: POLYPECTOMY;  Surgeon: Leigh Elspeth SQUIBB, MD;  Location: [facility];  Service: Gastroenterology;;   RIGHT/LEFT HEART CATH AND CORONARY ANGIOGRAPHY N/A 04/01/2021   Procedure: RIGHT/LEFT HEART CATH AND CORONARY ANGIOGRAPHY;  Surgeon: Dann Candyce RAMAN, MD;  Location: [facility];  Service: Cardiovascular;  Laterality: N/A;   TUBAL LIGATION      OB History   No obstetric history on file.      Home Medications    Prior to Admission medications  Medication Sig Start Date End Date Taking? Authorizing Provider  cefdinir  (OMNICEF ) 300 MG capsule Take 1 capsule (300 mg total) by mouth 2 (two) times daily for 10 days. 12/01/24 12/11/24 Yes Trinisha Paget A, NP  predniSONE  (DELTASONE ) 20 MG tablet Take 2 tablets (40 mg total) by mouth daily for 5 days. 12/01/24 12/06/24 Yes Johnie Flaming A, NP  abemaciclib  (VERZENIO ) 100 MG tablet Take 1 tablet (100 mg total) by mouth 2 (two) times daily. 11/18/24   Iruku, Praveena, MD  albuterol  (PROVENTIL ) (2.5 MG/3ML) 0.083% nebulizer solution Take 3 mLs (2.5 mg total) by nebulization every 4 (four) hours as needed for wheezing or shortness of breath. 01/26/24   Ward, Jessica Z, PA-C  albuterol  (VENTOLIN  HFA) 108 (90 Base) MCG/ACT inhaler Inhale 2 puffs into the lungs every 6 (six) hours as needed for wheezing or shortness of breath. 03/31/24   Hunsucker, Donnice SAUNDERS, MD  anastrozole  (ARIMIDEX ) 1 MG tablet Take 1 tablet (1 mg total) by mouth daily. 11/18/24   Iruku, Praveena, MD  Budeson-Glycopyrrol-Formoterol  (BREZTRI  AEROSPHERE) 160-9-4.8 MCG/ACT AERO Inhale 2 puffs into the lungs in the morning and at bedtime. 12/17/23   Jaycee Greig PARAS, NP  carvedilol  (COREG ) 12.5 MG tablet Take 1 tablet (12.5 mg total) by mouth 2 (two) times daily with a meal. 11/15/23   Sabharwal, Aditya, DO  dapagliflozin  propanediol (FARXIGA ) 10 MG TABS tablet Take 1 tablet (10 mg total) by mouth daily before breakfast. 09/30/24   Bensimhon, Toribio SAUNDERS, MD  DULoxetine (CYMBALTA) 30 MG capsule Take 30 mg by mouth daily. 02/06/24   [provider]  fluticasone  (FLONASE ) 50 MCG/ACT nasal spray Place 1 spray into both nostrils daily. 08/22/24   Mayer, Jodi R, NP  furosemide  (LASIX ) 20 MG tablet TAKE ONE TABLET (20 MG) BY MOUTH DAILY AT 9AM 11/15/23   Sabharwal, Aditya, DO  gabapentin  (NEURONTIN ) 300 MG capsule Take 1 capsule (300 mg total) by mouth at  bedtime. 11/18/24   Iruku, Praveena, MD  ibuprofen (ADVIL) 800 MG tablet Take 800 mg by mouth every 6 (six) hours as needed for fever, headache or mild pain (pain score 1-3).    [provider]  melatonin 1 MG TABS tablet Take 1 mg by mouth at bedtime.    [provider]  montelukast  (SINGULAIR ) 10 MG tablet Take 1 tablet (10 mg total) by mouth at bedtime. 09/24/24   Jaycee Greig PARAS, NP  Multiple Vitamin (MULTIVITAMIN) tablet Take 1 tablet by mouth daily.    [provider]  oxyCODONE  (OXY IR/ROXICODONE ) 5 MG immediate release tablet Take 1 tablet (5 mg total) by mouth every 6 (six) hours as needed for severe pain (pain score 7-10). 10/24/24   Dewey Rush, MD  sacubitril -valsartan  (ENTRESTO ) 97-103 MG Take 1 tablet by mouth 2 (two) times daily. 09/29/24   Rolan Ezra RAMAN, MD  spironolactone  (ALDACTONE ) 25 MG tablet TAKE ONE TABLET (25 MG) BY MOUTH DAILY 06/16/24   Sabharwal, Aditya, DO  traZODone  (DESYREL ) 150 MG tablet Take 150 mg by mouth at bedtime.    [provider]    Family History Family History  Problem Relation Age of Onset   Colon polyps Mother    Hypertension Mother    Stroke Mother    Deep vein thrombosis Mother    Congestive Heart Failure Mother     Arthritis Mother    Asthma Mother    Depression Mother    Other Father        history unknown   Obesity Sister    Diabetes Sister    Obesity Brother    Lung cancer Maternal Uncle        smoked   Congestive Heart Failure Maternal Grandmother    Throat cancer Maternal Grandfather        smoked   Colon cancer Neg Hx    Esophageal cancer Neg Hx    Rectal cancer Neg Hx    Stomach cancer Neg Hx     Social History Social History[1]   Allergies   Clarithromycin, Penicillins, and Penicillin g   Review of Systems Review of Systems  Per HPI  Physical Exam Triage Vital Signs ED Triage Vitals [12/01/24 1704]  Encounter Vitals Group     BP 100/69     Girls Systolic BP Percentile      Girls Diastolic BP Percentile      Boys Systolic BP Percentile      Boys Diastolic BP Percentile      Pulse Rate 76     Resp 17     Temp 99.4 F (37.4 C)     Temp Source Oral     SpO2 96 %     Weight      Height      Head Circumference      Peak Flow      Pain Score      Pain Loc      Pain Education      Exclude from Growth Chart    No data found.  Updated Vital Signs BP 100/69 (BP Location: Right Arm)   Pulse 76   Temp 99.4 F (37.4 C) (Oral)   Resp 17   SpO2 96%   Visual Acuity Right Eye Distance:   Left Eye Distance:   Bilateral Distance:    Right Eye Near:   Left Eye Near:    Bilateral Near:     Physical Exam Vitals and nursing note reviewed.  Constitutional:      General: She is awake. She is not in acute distress.    Appearance: Normal appearance. She is well-developed and well-groomed. She is not ill-appearing.  HENT:     Right Ear: Tympanic membrane, ear canal and external ear normal.     Left Ear: A middle ear effusion is present. Tympanic membrane is erythematous.     Nose: Congestion present.     Mouth/Throat:     Mouth: Mucous membranes are moist.     Pharynx: Posterior oropharyngeal erythema and postnasal drip present. No oropharyngeal exudate.   Cardiovascular:     Rate and Rhythm: Normal rate and regular rhythm.  Pulmonary:     Effort: Pulmonary effort is normal.     Breath sounds: Normal breath sounds.  Skin:    General: Skin is warm and dry.  Neurological:     General: No focal deficit present.     Mental Status: She is alert and oriented to person, place, and time. Mental status is at baseline.  Psychiatric:        Behavior: Behavior is cooperative.      UC Treatments / Results  Labs (all labs ordered are listed, but only abnormal results are displayed) Labs Reviewed  POCT RAPID STREP A (OFFICE)    EKG   Radiology No results found.  Procedures Procedures (including critical care time)  Medications Ordered in UC Medications - No data to display  Initial Impression / Assessment and Plan / UC Course  I have reviewed the triage vital signs and the nursing notes.  Pertinent labs & imaging results that were available during my care of the patient were reviewed by me and considered in my medical decision making (see chart for details).     Patient is overall well-appearing.  Vitals are stable.  Repeated strep test due to presence of continued sore throat.  Rapid strep was negative.  Exam findings concerning for otitis media with effusion.  Prescribed cefdinir  and prednisone  to treat this.  Recommended Tylenol  as needed for pain.  Discussed follow-up and return precautions. Final Clinical Impressions(s) / UC Diagnoses   Final diagnoses:  Sore throat  Left otitis media with effusion     Discharge Instructions      Your strep test was negative again.  Based on your exam findings I am concerned for an ear infection. Start taking cefdinir  twice daily for 10 days. Also take 2 tablets of prednisone  once daily for 5 days for additional relief of symptoms. You can take 500 to 1000 mg of Tylenol  every 6-8 hours as needed for pain. Follow-up with your primary care provider or return here as needed.     ED  Prescriptions     Medication Sig Dispense Auth. Provider   cefdinir  (OMNICEF ) 300 MG capsule Take 1 capsule (300 mg total) by mouth 2 (two) times daily for 10 days. 20 capsule Johnie Flaming A, NP   predniSONE  (DELTASONE ) 20 MG tablet Take 2 tablets (40 mg total) by mouth daily for 5 days. 10 tablet Johnie Flaming A, NP      PDMP not reviewed this encounter.    [1]  Social History Tobacco Use   Smoking status: Never    Passive exposure: Current   Smokeless tobacco: Never  Vaping Use   Vaping status: Never Used  Substance Use Topics   Alcohol use: Not Currently    Comment: occ   Drug use: Yes    Frequency: 1.0 times per week  Types: Marijuana    Comment: Pt has not sued cocaine in 6 months ( as of 04/07/24) twice a week     Johnie Rumaldo LABOR, NP 12/01/24 1820  "

## 2024-12-01 NOTE — Discharge Instructions (Addendum)
 Your strep test was negative again.  Based on your exam findings I am concerned for an ear infection. Start taking cefdinir  twice daily for 10 days. Also take 2 tablets of prednisone  once daily for 5 days for additional relief of symptoms. You can take 500 to 1000 mg of Tylenol  every 6-8 hours as needed for pain. Follow-up with your primary care provider or return here as needed.

## 2024-12-01 NOTE — Progress Notes (Unsigned)
 " [facility]        Telephone: [phone]?Fax: [phone]   Oncology Clinical Pharmacist Practitioner Initial Assessment   Janet [name] is a 59 y.o. female with a diagnosis of breast cancer. They were contacted today via in-person visit.  Indication/Regimen Abemaciclib  (Verzenio ) is being used appropriately for treatment of breast cancer in the adjuvant setting by Dr. Amber Stalls. The treatment goal is: Curative.     Wt Readings from Last 1 Encounters:  11/18/24 268 lb 11.2 oz (121.9 kg)    Estimated body surface area is 2.42 meters squared as calculated from the following:   Height as of 09/02/24: 5' 8 (1.727 m).   Weight as of 11/18/24: 268 lb 11.2 oz (121.9 kg).  The dosing regimen is 100 mg by mouth every 12 hours on days 1 to 28 of a 28-day cycle. This is being given  in combination with anastrozole  which was started on 11/18/24.. It is planned to continue until two years in the adjuvant setting per the monarchE trial data. Prescription dose and frequency assessed for appropriateness.  Patient has agreed to treatment which is documented in physician note on 11/18/24. Counseled patient on administration, dosing, side effects, monitoring, drug-food interactions, safe handling, storage, and disposal.  Will be delivered on 12/04/24 and she will start next day.   Dose Modifications Dr. Stalls reducing dose to 100 mg PO BID  Access Assessment Janet [name] will be receiving abemaciclib  through [facility] Concerns: none Start date if known: 12/05/24  Adherence Assessment Reviewed importance on keeping a med schedule and plan for any missed doses Barriers to adherence identified? No  Communication and Learning Assessment Primary learner: patient Barriers to learning: No barriers Preferred language: English Learning preferences: Listening   Allergies Allergies[1]  Vitals    11/18/2024    1:48 PM  10/08/2024    8:29 AM 09/02/2024    2:15 PM  Oncology Vitals  Height   173 cm  Weight 121.882 kg 122.199 kg 126.554 kg  Weight (lbs) 268 lbs 11 oz 269 lbs 6 oz 279 lbs  BMI 40.86 kg/m2 40.96 kg/m2 42.42 kg/m2  Temp 97.8 F (36.6 C) 97.7 F (36.5 C)   Pulse Rate 68 79   BP 118/75 120/85   Resp 18 18   SpO2 97 % 98 %   BSA (m2) 2.42 m2 2.42 m2 2.46 m2     Laboratory Data    Latest Ref Rng & Units 10/08/2024    9:26 AM 07/25/2024   11:29 AM 07/04/2024    9:24 AM  CBC EXTENDED  WBC 4.0 - 10.5 K/uL 3.2  10.9  8.5   RBC 3.87 - 5.11 MIL/uL 4.68  4.40  4.46   Hemoglobin 12.0 - 15.0 g/dL 86.3  87.8  87.9   HCT 36.0 - 46.0 % 41.0  36.7  37.1   Platelets 150 - 400 K/uL 185  338  287   NEUT# 1.7 - 7.7 K/uL 1.9  8.8  7.2   Lymph# 0.7 - 4.0 K/uL 0.8  1.5  1.0        Latest Ref Rng & Units 07/25/2024   11:29 AM 07/04/2024    9:24 AM 06/13/2024   10:43 AM  CMP  Glucose 70 - 99 mg/dL 881  873  823   BUN 6 - 20 mg/dL 18  17  17    Creatinine 0.44 - 1.00 mg/dL 9.12  9.04  9.08   Sodium  135 - 145 mmol/L 139  141  139   Potassium 3.5 - 5.1 mmol/L 4.2  4.3  3.8   Chloride 98 - 111 mmol/L 107  109  108   CO2 22 - 32 mmol/L 27  25  24    Calcium 8.9 - 10.3 mg/dL 9.6  9.2  9.1   Total Protein 6.5 - 8.1 g/dL 6.6  6.3  6.5   Total Bilirubin 0.0 - 1.2 mg/dL 0.4  0.3  0.3   Alkaline Phos 38 - 126 U/L 58  60  65   AST 15 - 41 U/L 20  16  15    ALT 0 - 44 U/L 24  19  26     Contraindications Contraindications were reviewed? Yes Contraindications to therapy were identified? No   Safety Precautions The following safety precautions for the use of abemaciclib  were reviewed:  Changes in kidney function: importance of drinking plenty of fluids and monitoring urine output Diarrhea: we reviewed that diarrhea is common with abemaciclib  and confirmed that she does have loperamide (Imodium) at home.  We reviewed how to take this medication PRN and gave her information on abemaciclib  Decreased white blood  cells (WBCs) and increased risk for infection: we discussed the importance of having a thermometer and what the Centers for Disease Control and Prevention (CDC) considers a fever which is 100.51F (38C) or higher.  Gave patient 24/7 triage line to call if any fevers or symptoms Decreased hemoglobin, part of red blood cells that carry iron and oxygen Fatigue Nausea and Vomiting Hepatotoxicity: reviewed to contact clinic for RUQ pain that will not subside, yellowing of eyes/skin Decreased appetite or weight loss Abdominal pain Decreased platelet count and increased risk for bleeding Venous thromboembolism (VTE): reviewed signs of deep vein thrombosis (DVT) such as leg swelling, redness, pain, or tenderness and signs of pulmonary embolism (PE) such as shortness of breath, rapid or irregular heartbeat, cough, chest pain, or lightheadedness ILD/Pneumonitis: we reviewed potential symptoms including cough, shortness, and fatigue. Handling body fluids and waste Pregnancy, sexual activity, and contraception Avoid grapefruit products Reviewed to take the medication every 12 hours (with food sometimes can be easier on the stomach) and to take it at the same time every day. Discussed proper storage and handling of abemaciclib   Medication Reconciliation Current Outpatient Medications  Medication Sig Dispense Refill   abemaciclib  (VERZENIO ) 100 MG tablet Take 1 tablet (100 mg total) by mouth 2 (two) times daily. 56 tablet 3   albuterol  (PROVENTIL ) (2.5 MG/3ML) 0.083% nebulizer solution Take 3 mLs (2.5 mg total) by nebulization every 4 (four) hours as needed for wheezing or shortness of breath. 75 mL 2   albuterol  (VENTOLIN  HFA) 108 (90 Base) MCG/ACT inhaler Inhale 2 puffs into the lungs every 6 (six) hours as needed for wheezing or shortness of breath. 18 g 2   anastrozole  (ARIMIDEX ) 1 MG tablet Take 1 tablet (1 mg total) by mouth daily. 90 tablet 3   Budeson-Glycopyrrol-Formoterol  (BREZTRI  AEROSPHERE)  160-9-4.8 MCG/ACT AERO Inhale 2 puffs into the lungs in the morning and at bedtime. 10.7 g 5   carvedilol  (COREG ) 12.5 MG tablet Take 1 tablet (12.5 mg total) by mouth 2 (two) times daily with a meal. 60 tablet 11   dapagliflozin  propanediol (FARXIGA ) 10 MG TABS tablet Take 1 tablet (10 mg total) by mouth daily before breakfast. 90 tablet 3   DULoxetine (CYMBALTA) 30 MG capsule Take 30 mg by mouth daily.     fluticasone  (FLONASE ) 50 MCG/ACT nasal spray  Place 1 spray into both nostrils daily. 15.8 mL 0   furosemide  (LASIX ) 20 MG tablet TAKE ONE TABLET (20 MG) BY MOUTH DAILY AT 9AM 30 tablet 11   gabapentin  (NEURONTIN ) 300 MG capsule Take 1 capsule (300 mg total) by mouth at bedtime. 30 capsule 1   ibuprofen (ADVIL) 800 MG tablet Take 800 mg by mouth every 6 (six) hours as needed for fever, headache or mild pain (pain score 1-3).     melatonin 1 MG TABS tablet Take 1 mg by mouth at bedtime.     montelukast  (SINGULAIR ) 10 MG tablet Take 1 tablet (10 mg total) by mouth at bedtime. 90 tablet 0   Multiple Vitamin (MULTIVITAMIN) tablet Take 1 tablet by mouth daily.     oxyCODONE  (OXY IR/ROXICODONE ) 5 MG immediate release tablet Take 1 tablet (5 mg total) by mouth every 6 (six) hours as needed for severe pain (pain score 7-10). 15 tablet 0   sacubitril -valsartan  (ENTRESTO ) 97-103 MG Take 1 tablet by mouth 2 (two) times daily. 180 tablet 3   spironolactone  (ALDACTONE ) 25 MG tablet TAKE ONE TABLET (25 MG) BY MOUTH DAILY 90 tablet 11   traZODone  (DESYREL ) 150 MG tablet Take 150 mg by mouth at bedtime.     No current facility-administered medications for this visit.   Medication reconciliation is based on the patient's most recent medication list in the electronic medical record (EMR) including herbal products and OTC medications.   The patient's medication list was reviewed today with the patient? Yes   Drug-drug interactions (DDIs) DDIs were evaluated? Yes Significant DDIs identified? Several drug-drug  interactions noted when reviewing medications that were discussed with patient today: 1) duloxetine - iburpofen: increased bleeding risk; 2) spironolactone  - sacubitril  and valsartan : increased potassium risk; 3) spironolactone  - ibuprofen: increased potassium risk; 4) trazodone  - duloxetine: increased serotonin syndrome risk  Drug-Food Interactions Drug-food interactions were evaluated? Yes Drug-food interactions identified? Grapefruit products  Follow-up Plan  Patient education handout given to patient Start abemaciclib  100 mg by mouth every 12 hours. Start date 12/05/24 Continue anastrozole  1 mg by mouth daily Monitor for side effects Distress thermometer not done as patient has had prior treatment at [facility].   Will add labs, Dr. Loretha visit in 2 weeks Will add labs, pharmacy clinic visit in 4 weeks Janet [name] can follow up with clinical pharmacy as deemed necessary by Dr. Amber Iruku going forward   Janet [name] participated in the discussion, expressed understanding, and voiced agreement with the above plan. All questions were answered to her satisfaction. The patient was advised to contact the clinic at [phone] with any questions or concerns prior to her return visit.   I spent 60 minutes assessing the patient.  Janet [name] A. [name], PharmD, BCOP, CPP  Janet [name], RPH-CPP, 12/01/2024 9:01 AM  **Disclaimer: This note was dictated with voice recognition software. Similar sounding words can inadvertently be transcribed and this note may contain transcription errors which may not have been corrected upon publication of note.**     [1]  Allergies Allergen Reactions   Clarithromycin Other (See Comments)    Biaxin  GI upset  Other Reaction(s): Other (See Comments)  Upset stomach, Cramps   Penicillins Rash, Hives and Other (See Comments)   Penicillin G Rash   "

## 2024-12-01 NOTE — ED Triage Notes (Signed)
 Pt has c/o sore throat x 9 days. Pt states she has been taking cold medicine and oxycodone  for pain and nothing has been helping. Pt also has been doing salt water gargles at home with no relief. Last of oxycodone  at 10 am this morning.

## 2024-12-02 ENCOUNTER — Other Ambulatory Visit: Payer: Self-pay

## 2024-12-02 ENCOUNTER — Other Ambulatory Visit (HOSPITAL_COMMUNITY): Payer: Self-pay

## 2024-12-02 ENCOUNTER — Inpatient Hospital Stay: Admitting: Pharmacist

## 2024-12-02 ENCOUNTER — Inpatient Hospital Stay: Attending: Hematology and Oncology

## 2024-12-02 VITALS — BP 103/67 | HR 72 | Temp 97.3°F | Resp 18 | Wt 262.8 lb

## 2024-12-02 DIAGNOSIS — C50212 Malignant neoplasm of upper-inner quadrant of left female breast: Secondary | ICD-10-CM

## 2024-12-02 LAB — CBC WITH DIFFERENTIAL (CANCER CENTER ONLY)
Abs Immature Granulocytes: 0.02 K/uL (ref 0.00–0.07)
Basophils Absolute: 0 K/uL (ref 0.0–0.1)
Basophils Relative: 0 %
Eosinophils Absolute: 0 K/uL (ref 0.0–0.5)
Eosinophils Relative: 0 %
HCT: 44.5 % (ref 36.0–46.0)
Hemoglobin: 14.8 g/dL (ref 12.0–15.0)
Immature Granulocytes: 0 %
Lymphocytes Relative: 17 %
Lymphs Abs: 1.1 K/uL (ref 0.7–4.0)
MCH: 27.8 pg (ref 26.0–34.0)
MCHC: 33.3 g/dL (ref 30.0–36.0)
MCV: 83.6 fL (ref 80.0–100.0)
Monocytes Absolute: 0.4 K/uL (ref 0.1–1.0)
Monocytes Relative: 6 %
Neutro Abs: 4.7 K/uL (ref 1.7–7.7)
Neutrophils Relative %: 77 %
Platelet Count: 253 K/uL (ref 150–400)
RBC: 5.32 MIL/uL — ABNORMAL HIGH (ref 3.87–5.11)
RDW: 14.6 % (ref 11.5–15.5)
WBC Count: 6.3 K/uL (ref 4.0–10.5)
nRBC: 0 % (ref 0.0–0.2)

## 2024-12-02 LAB — CMP (CANCER CENTER ONLY)
ALT: 12 U/L (ref 0–44)
AST: 15 U/L (ref 15–41)
Albumin: 4 g/dL (ref 3.5–5.0)
Alkaline Phosphatase: 92 U/L (ref 38–126)
Anion gap: 12 (ref 5–15)
BUN: 18 mg/dL (ref 6–20)
CO2: 26 mmol/L (ref 22–32)
Calcium: 9.9 mg/dL (ref 8.9–10.3)
Chloride: 101 mmol/L (ref 98–111)
Creatinine: 1.17 mg/dL — ABNORMAL HIGH (ref 0.44–1.00)
GFR, Estimated: 54 mL/min — ABNORMAL LOW
Glucose, Bld: 115 mg/dL — ABNORMAL HIGH (ref 70–99)
Potassium: 4.4 mmol/L (ref 3.5–5.1)
Sodium: 138 mmol/L (ref 135–145)
Total Bilirubin: 0.3 mg/dL (ref 0.0–1.2)
Total Protein: 7.7 g/dL (ref 6.5–8.1)

## 2024-12-02 NOTE — Progress Notes (Signed)
 Specialty Pharmacy Initial Fill Coordination Note  Janet [name] is a 59 y.o. female contacted today regarding refills of specialty medication(s) Abemaciclib  (VERZENIO ) .  Patient requested Delivery  on 12/04/24  to verified address [address]    [place] [id number]   Medication will be filled on 12/03/24.   Patient is aware of $11.76 copayment.

## 2024-12-02 NOTE — Progress Notes (Signed)
 Patient counseled in clinic in-person visit note on 12/02/24

## 2024-12-02 NOTE — Patient Instructions (Signed)
 [facility] [place] - A DEPT OF [place]  Thank you for choosing [facility] to provide your oncology/hematology care and for allowing us  to participate in your care today!  As a reminder, we spoke about the following today: abemaciclib  for the treatment of breast cancer. It is planned to continue until two years in the adjuvant setting per the monarchE trial data.  Treatment goal: Curative  Medication handout has been provided.   **For oral cancer medication prescription refill requests, contact your pharmacy and they will contact our office if needed. Allow 5-7 days for refills to be completed by your specialty pharmacy.    Cancer Center General Instructions:  If you have an appointment at the [facility], please go directly to the Cancer Center and check in at the registration area.  We strive to give you quality time with your provider. You may need to reschedule your appointment if you arrive late (15 or more minutes).  Arriving late affects you and other patients whose appointments are after yours.  Also, if you miss three or more appointments without notifying the office, you may be dismissed from the clinic at the provider's discretion.      BELOW ARE SYMPTOMS THAT SHOULD BE REPORTED IMMEDIATELY: *FEVER GREATER THAN 100.4 F (38 C) OR HIGHER *CHILLS OR SWEATING *NAUSEA AND VOMITING THAT IS NOT CONTROLLED WITH YOUR NAUSEA MEDICATION *UNUSUAL SHORTNESS OF BREATH *UNUSUAL BRUISING OR BLEEDING *URINARY PROBLEMS (pain or burning when urinating, or frequent urination) *BOWEL PROBLEMS (unusual diarrhea, constipation, pain near the anus) TENDERNESS IN MOUTH AND THROAT WITH OR WITHOUT PRESENCE OF ULCERS (sore throat, sores in mouth, or a toothache) UNUSUAL RASH, SWELLING OR PAIN  UNUSUAL VAGINAL DISCHARGE OR ITCHING   Items with * indicate a potential emergency and should be followed up as soon as possible or go to the Emergency Department if  any problems should occur.  Please show the CHEMOTHERAPY ALERT CARD at check-in to the Emergency Department and triage nurse.  Should you have questions after your visit or need to cancel or reschedule your appointment, please contact [facility] [place] - A DEPT OF [facility]  [phone] and follow the prompts.  Office hours are 8:00 a.m. to 4:30 p.m. Monday - Friday. Please note that voicemails left after 4:00 p.m. may not be returned until the following business day.  We are closed weekends and major holidays. You have access to a nurse at all times for urgent questions. Please call the main number to the clinic [phone] and follow the prompts.  For any non-urgent questions, you may also contact your provider using MyChart. We now offer e-Visits for anyone 63 and older to request care online for non-urgent symptoms. For details visit [email or web address].   Also download the MyChart app! Go to the app store, search MyChart, open the app, select [place], and log in with your MyChart username and password.

## 2024-12-03 ENCOUNTER — Other Ambulatory Visit: Payer: Self-pay

## 2024-12-15 ENCOUNTER — Other Ambulatory Visit: Payer: Self-pay | Admitting: Hematology and Oncology

## 2024-12-23 ENCOUNTER — Other Ambulatory Visit: Payer: Self-pay

## 2024-12-23 NOTE — Progress Notes (Signed)
 Specialty Pharmacy Ongoing Clinical Assessment Note  Janet [name] is a 59 y.o. female who is being followed by the specialty pharmacy service for RxSp Oncology   Patient's specialty medication(s) reviewed today: Abemaciclib  (VERZENIO )   Missed doses in the last 4 weeks: 0   Patient/Caregiver did not have any additional questions or concerns.   Therapeutic benefit summary: Unable to assess   Adverse events/side effects summary: Experienced adverse events/side effects (diarrhea, recommended Imodium OTC and bone pain; both are tolerable at this time)   Patient's therapy is appropriate to: Continue    Goals Addressed             This Visit's Progress    Maintain optimal adherence to therapy   On track    Patient is on track. Patient will maintain adherence         Follow up: 3 months  Silvano [name] Specialty Pharmacist

## 2024-12-23 NOTE — Progress Notes (Signed)
 Specialty Pharmacy Refill Coordination Note  Janet [name] is a 59 y.o. female contacted today regarding refills of specialty medication(s) Abemaciclib  (VERZENIO )   Patient requested Delivery   Delivery date: 12/31/24   Verified address: [address]   [place] [address]   Medication will be filled on: 12/30/24

## 2025-01-02 ENCOUNTER — Inpatient Hospital Stay: Attending: Hematology and Oncology

## 2025-01-02 ENCOUNTER — Inpatient Hospital Stay: Admitting: Hematology and Oncology

## 2025-01-02 ENCOUNTER — Inpatient Hospital Stay

## 2025-01-14 ENCOUNTER — Inpatient Hospital Stay

## 2025-01-14 ENCOUNTER — Inpatient Hospital Stay: Admitting: Pharmacist

## 2025-01-15 ENCOUNTER — Ambulatory Visit: Payer: 59

## 2025-01-27 ENCOUNTER — Inpatient Hospital Stay: Attending: Hematology and Oncology | Admitting: Hematology and Oncology

## 2025-02-02 ENCOUNTER — Inpatient Hospital Stay: Admitting: Adult Health

## 2025-02-02 ENCOUNTER — Inpatient Hospital Stay
